# Patient Record
Sex: Female | Born: 1955 | Race: White | Hispanic: No | Marital: Married | Smoking: Never smoker
Health system: Southern US, Community
[De-identification: ages and names within clinical notes are randomized; demographics above are authoritative.]

## PROBLEM LIST (undated history)

## (undated) DIAGNOSIS — N183 Chronic kidney disease, stage 3 (moderate): Secondary | ICD-10-CM

## (undated) DIAGNOSIS — M549 Dorsalgia, unspecified: Secondary | ICD-10-CM

## (undated) DIAGNOSIS — K759 Inflammatory liver disease, unspecified: Secondary | ICD-10-CM

## (undated) DIAGNOSIS — D649 Anemia, unspecified: Secondary | ICD-10-CM

## (undated) DIAGNOSIS — R600 Localized edema: Secondary | ICD-10-CM

## (undated) DIAGNOSIS — G8929 Other chronic pain: Secondary | ICD-10-CM

## (undated) DIAGNOSIS — N289 Disorder of kidney and ureter, unspecified: Secondary | ICD-10-CM

## (undated) DIAGNOSIS — D631 Anemia in chronic kidney disease: Secondary | ICD-10-CM

## (undated) DIAGNOSIS — R161 Splenomegaly, not elsewhere classified: Secondary | ICD-10-CM

## (undated) DIAGNOSIS — K7460 Unspecified cirrhosis of liver: Secondary | ICD-10-CM

## (undated) DIAGNOSIS — K5090 Crohn's disease, unspecified, without complications: Secondary | ICD-10-CM

## (undated) DIAGNOSIS — I10 Essential (primary) hypertension: Secondary | ICD-10-CM

## (undated) DIAGNOSIS — K529 Noninfective gastroenteritis and colitis, unspecified: Secondary | ICD-10-CM

## (undated) DIAGNOSIS — R768 Other specified abnormal immunological findings in serum: Secondary | ICD-10-CM

## (undated) DIAGNOSIS — D696 Thrombocytopenia, unspecified: Secondary | ICD-10-CM

## (undated) HISTORY — DX: Essential (primary) hypertension: I10

## (undated) HISTORY — DX: Anemia in chronic kidney disease: D63.1

## (undated) HISTORY — PX: ABSCESS DRAINAGE: SHX1119

## (undated) HISTORY — DX: Chronic kidney disease, stage 3 (moderate): N18.3

## (undated) HISTORY — DX: Crohn's disease, unspecified, without complications: K50.90

## (undated) HISTORY — DX: Localized edema: R60.0

## (undated) HISTORY — PX: OTHER SURGICAL HISTORY: SHX169

## (undated) HISTORY — PX: CHOLECYSTECTOMY: SHX55

## (undated) HISTORY — PX: COLON SURGERY: SHX602

## (undated) HISTORY — PX: ABDOMINAL SURGERY: SHX537

## (undated) HISTORY — DX: Other specified abnormal immunological findings in serum: R76.8

## (undated) HISTORY — PX: ILEOSTOMY: SHX1783

## (undated) HISTORY — DX: Noninfective gastroenteritis and colitis, unspecified: K52.9

## (undated) HISTORY — DX: Anemia, unspecified: D64.9

---

## 2010-08-19 ENCOUNTER — Encounter (INDEPENDENT_AMBULATORY_CARE_PROVIDER_SITE_OTHER): Payer: Self-pay

## 2010-08-19 DIAGNOSIS — I10 Essential (primary) hypertension: Secondary | ICD-10-CM | POA: Insufficient documentation

## 2010-08-19 DIAGNOSIS — K5090 Crohn's disease, unspecified, without complications: Secondary | ICD-10-CM | POA: Insufficient documentation

## 2010-09-02 ENCOUNTER — Encounter (INDEPENDENT_AMBULATORY_CARE_PROVIDER_SITE_OTHER): Payer: Self-pay | Admitting: *Deleted

## 2010-09-02 ENCOUNTER — Telehealth (INDEPENDENT_AMBULATORY_CARE_PROVIDER_SITE_OTHER): Payer: Self-pay | Admitting: *Deleted

## 2010-09-02 ENCOUNTER — Encounter (INDEPENDENT_AMBULATORY_CARE_PROVIDER_SITE_OTHER): Payer: Self-pay | Admitting: Internal Medicine

## 2010-09-02 ENCOUNTER — Ambulatory Visit (INDEPENDENT_AMBULATORY_CARE_PROVIDER_SITE_OTHER): Payer: Medicare Other | Admitting: Internal Medicine

## 2010-09-02 VITALS — BP 100/64 | HR 72 | Temp 98.6°F | Ht 65.5 in | Wt 248.9 lb

## 2010-09-02 DIAGNOSIS — D649 Anemia, unspecified: Secondary | ICD-10-CM

## 2010-09-02 DIAGNOSIS — D62 Acute posthemorrhagic anemia: Secondary | ICD-10-CM | POA: Insufficient documentation

## 2010-09-02 DIAGNOSIS — K5090 Crohn's disease, unspecified, without complications: Secondary | ICD-10-CM

## 2010-09-02 MED ORDER — OMEPRAZOLE 20 MG PO CPDR: 20.0000 mg | DELAYED_RELEASE_CAPSULE | Freq: Two times a day (BID) | ORAL | Status: DC

## 2010-09-02 NOTE — Progress Notes (Signed)
Subjective:     Patient ID: Kathryn [name], female   DOB: March 08, 1955, 55 y.o.   MRN: [id number]  HPI Referral from Dr. Olena Leatherwood requesting capsule study.  Hx of Crohn's. Bleeding from stoma from the ileostomy site. Diagnosed with Crohn's 1985.   She has been having intestinal bleeding in her ileostomy bag which has now resolved. She says there were black clots in her ileostomy bag while in the hospital..  The last time she saw blood was the July 11th.  She c/o pain under rt posterior ribs and radiates around.  She c/o nausea.  She is having foamy green stools. She says her symptoms are similar when she had her gallbladder.  Admitted to [facility] last month for a Crohn's excerebration.  She received 2 units of PRBCs while in the hospital.  Sunday she was having rt posterior rib pain .  Sometimes her nose burns.  When she lies down at night she c/o  voiding frequently during the night. She does have occasionally acid reflux. No burning in her esophagus. No hematemesis.   She also had epigastric pain last month.  Most of her pain is in her back. She had been taking Meloxicam 3 times a week before her admission for pain in her hands..  Stools are yellow and foamy now.  She has a hx of a duodenal ulcer age 85 or 58.   Her last colon surgery was in 2004.   07/31/2010 Glucose 156, BUN 12, Creatinine 0.93, Calcium 8.6, NA 130, K 3.5, Chloride 96,  Hand H 11.8 and 35.6, MCV 94.0, Platelets 133, 6/22 H and H 8.8 and 25.9    Review of Systems see hpi  Family HX: She is married. She does not drink, smoke or do drugs. She has one child in good health.     Objective:   Physical ExamAlert and oriented. Skin warm and dry. Oral mucosa is moist. Natural teeth in good condition. Sclera anicteric, conjunctivae is pink. Thyroid not enlarged. No cervical lymphadenopathy. Lungs clear. Heart regular rate and rhythm.  Abdomen is soft. Bowel sounds are positive.  Multiple scars to abdomen.  Ileostomy bag in place with light  yellow/brown liquid stool.  No hepatomegaly. No abdominal masses felt. No tenderness.  No edema to lower extremities. Patient is alert and oriented.      Assessment:   Anemia,  PUD needs to be ruled out given hx of taking Meloxicam.  She also has a past hx of a duodenal     Plan:   EGD with Dr. [name],.The risks and benefits such as perforation, bleeding, and infection were reviewed with the patient and is agreeable.

## 2010-09-02 NOTE — Telephone Encounter (Signed)
EGD sch'd 10/05/10 @ 7:30 (6:30), pt aware, instr given to patient

## 2010-09-03 ENCOUNTER — Telehealth (INDEPENDENT_AMBULATORY_CARE_PROVIDER_SITE_OTHER): Payer: Self-pay | Admitting: Internal Medicine

## 2010-09-03 LAB — CBC
HCT: 34.9 % — ABNORMAL LOW (ref 36.0–46.0)
Hemoglobin: 11.5 g/dL — ABNORMAL LOW (ref 12.0–15.0)
MCH: 30.9 pg (ref 26.0–34.0)
MCHC: 33 g/dL (ref 30.0–36.0)
MCV: 93.8 fL (ref 78.0–100.0)
RDW: 14.9 % (ref 11.5–15.5)

## 2010-09-06 NOTE — Telephone Encounter (Signed)
Results given to patient

## 2010-09-07 ENCOUNTER — Telehealth (INDEPENDENT_AMBULATORY_CARE_PROVIDER_SITE_OTHER): Payer: Self-pay | Admitting: Internal Medicine

## 2010-09-07 NOTE — Telephone Encounter (Signed)
none

## 2010-10-04 MED ORDER — SODIUM CHLORIDE 0.45 % IV SOLN
Freq: Once | INTRAVENOUS | Status: AC
  Administered 2010-10-05: 07:00:00 via INTRAVENOUS

## 2010-10-05 ENCOUNTER — Other Ambulatory Visit (INDEPENDENT_AMBULATORY_CARE_PROVIDER_SITE_OTHER): Payer: Self-pay | Admitting: Internal Medicine

## 2010-10-05 ENCOUNTER — Encounter (HOSPITAL_COMMUNITY): Payer: Self-pay

## 2010-10-05 ENCOUNTER — Ambulatory Visit (HOSPITAL_COMMUNITY)
Admission: RE | Admit: 2010-10-05 | Discharge: 2010-10-05 | Disposition: A | Discharge status: Home / Self Care | Payer: Medicare Other | Source: Ambulatory Visit | Attending: Internal Medicine | Admitting: Internal Medicine

## 2010-10-05 ENCOUNTER — Encounter (HOSPITAL_COMMUNITY)
Admission: RE | Disposition: A | Discharge status: Home / Self Care | Payer: Self-pay | Source: Ambulatory Visit | Attending: Internal Medicine

## 2010-10-05 DIAGNOSIS — Z8639 Personal history of other endocrine, nutritional and metabolic disease: Secondary | ICD-10-CM

## 2010-10-05 DIAGNOSIS — R1011 Right upper quadrant pain: Secondary | ICD-10-CM | POA: Insufficient documentation

## 2010-10-05 DIAGNOSIS — Z862 Personal history of diseases of the blood and blood-forming organs and certain disorders involving the immune mechanism: Secondary | ICD-10-CM

## 2010-10-05 DIAGNOSIS — I10 Essential (primary) hypertension: Secondary | ICD-10-CM | POA: Insufficient documentation

## 2010-10-05 DIAGNOSIS — K2940 Chronic atrophic gastritis without bleeding: Secondary | ICD-10-CM | POA: Insufficient documentation

## 2010-10-05 DIAGNOSIS — K2960 Other gastritis without bleeding: Secondary | ICD-10-CM

## 2010-10-05 DIAGNOSIS — Z8719 Personal history of other diseases of the digestive system: Secondary | ICD-10-CM | POA: Insufficient documentation

## 2010-10-05 HISTORY — PX: ESOPHAGOGASTRODUODENOSCOPY: SHX5428

## 2010-10-05 SURGERY — EGD (ESOPHAGOGASTRODUODENOSCOPY)
Anesthesia: Moderate Sedation

## 2010-10-05 MED ORDER — MEPERIDINE HCL 25 MG/ML IJ SOLN
INTRAMUSCULAR | Status: DC | PRN
  Administered 2010-10-05 (×2): 25 mg via INTRAVENOUS

## 2010-10-05 MED ORDER — MEPERIDINE HCL 50 MG/ML IJ SOLN
INTRAMUSCULAR | Status: AC
  Filled 2010-10-05: qty 1

## 2010-10-05 MED ORDER — SUCRALFATE 1 G PO TABS: 1.0000 g | ORAL_TABLET | Freq: Four times a day (QID) | ORAL | Status: DC

## 2010-10-05 MED ORDER — MIDAZOLAM HCL 5 MG/5ML IJ SOLN
INTRAMUSCULAR | Status: AC
  Filled 2010-10-05: qty 10

## 2010-10-05 MED ORDER — BUTAMBEN-TETRACAINE-BENZOCAINE 2-2-14 % EX AERO
INHALATION_SPRAY | CUTANEOUS | Status: DC | PRN
  Administered 2010-10-05: 2 via TOPICAL

## 2010-10-05 MED ORDER — MIDAZOLAM HCL 5 MG/5ML IJ SOLN
INTRAMUSCULAR | Status: DC | PRN
  Administered 2010-10-05 (×5): 2 mg via INTRAVENOUS

## 2010-10-05 NOTE — H&P (Signed)
Kathryn [name] is an 55 y.o. female.   Chief Complaint: History of melena and anemia. Patient is here for EGD. HPI: Patient is 55 year old Caucasian female with history of Crohn's disease with ileostomy who's been experiencing intermittent melena for the last few months. First episode occurred last year and she has had these 2 episodes this year. She was admitted to [facility] in [place] and required 2 units of PRBCs. When she was seen in the office  4 weeks ago her hemoglobin was normal. She has been using meloxicam sporadically. She complains of intermittent pain in the right upper quadrant pain that she used to have prior to cholecystectomy. She also has a.chronic pain in left lower quadrant site where she had colostomy. Her heartburn is well controlled with a PPI. Although she has not a good appetite but she has not lost any weight. She still hasn rectal stump. She has pink rectal discharge worksheet every day and at times it is grossly bloody but small in volume. She has history of colonic Crohn's disease which was diagnosed in 27. Few years later she had come colostomy for the refractory anorectal disease.  8 years ago she had subtotal colectomy with ileostomy.  Past Medical History  Diagnosis Date  . Hypertension   . Enteritis presumed infectious   . Crohn's   . Crohn's   . Anemia     Past Surgical History  Procedure Date  . Colon surgery     MULTIPLE SURGERIES FOR CROHNS  . Ileostomy     multiple abdominal surgeries for Crohn's by Dr. Gabriel Cirri  . Cholecystectomy   . Abscess drainage     abdominal    Family History  Problem Relation Age of Onset  . Cancer Mother   . Stroke Father    Social History:  reports that she has never smoked. She has never used smokeless tobacco. She reports that she does not drink alcohol or use illicit drugs.  Allergies:  Allergies  Allergen Reactions  . Penicillins     Medications Prior to Admission  Medication Dose Route Frequency Provider Last Rate  Last Dose  . 0.45 % sodium chloride infusion   Intravenous Once [name], MD 20 mL/hr at 10/05/10 0715    . meperidine (DEMEROL) 50 MG/ML injection           . midazolam (VERSED) 5 MG/5ML injection            Medications Prior to Admission  Medication Sig Dispense Refill  . metroNIDAZOLE (FLAGYL) 500 MG tablet Take 500 mg by mouth every 8 (eight) hours.          No results found for this or any previous visit (from the past 48 hour(s)). No results found.  Review of Systems  Constitutional: Negative for fever and weight loss.  Gastrointestinal: Positive for blood in stool (daily rectal discharge which is blood tinged.) and melena (intermittent melena; none in 8 weeks.). Negative for nausea, vomiting, diarrhea and constipation. Heartburn: well controlled with prilosec. Abdominal pain: intermittent RUQ pain and chronic LLQ pain.    Blood pressure 136/84, pulse 87, temperature 98.4 F (36.9 C), temperature source Oral, resp. rate 18, height 5\' 5"  (1.651 m), weight 240 lb (108.863 kg), SpO2 94.00%. Physical Exam  Constitutional: She appears well-developed and well-nourished.  HENT:  Mouth/Throat: Oropharynx is clear and moist.  Eyes: Conjunctivae are normal. No scleral icterus.  Neck: No thyromegaly present.  Cardiovascular: Normal rate, regular rhythm and normal heart sounds.   No  murmur heard. Respiratory: Breath sounds normal.  GI: Soft. She exhibits no mass. There is tenderness (mild tenderness at RUQ and LLQ;). There is no rebound and no guarding.  Musculoskeletal: She exhibits no edema.  Lymphadenopathy:    She has no cervical adenopathy.  Neurological: She is alert.  Skin: Skin is warm and dry.     Assessment/Plan History of melena and anemia. Recurrent right upper quadrant abdominal pain. Diagnostic esophagogastroduodenoscopy.  Aairah Negrette U 10/05/2010, 7:45 AM

## 2010-10-05 NOTE — Op Note (Signed)
EGD PROCEDURE REPORT  PATIENT:  Kathryn [name]  MR#:  [id number] Birthdate:  1955-04-24, 55 y.o., female Endoscopist:  Dr. [name], MD Referred By:  Dr. Toma Deiters, MD Procedure Date: 10/05/2010  Procedure:   EGD  Indications:  History of melena and anemia; she also has a intermittent right upper quadrant abdominal pain and history of Crohn's disease.            Informed Consent: Procedure and risks were reviewed with the patient and informed consent was obtained. Medications:  Demerol 50  mg IV Versed 10  mg IV Cetacaine spray topically for oropharyngeal anesthesia  Description of procedure:  The endoscope was introduced through the mouth and advanced to the second portion of the duodenum without difficulty or limitations. The mucosal surfaces were surveyed very carefully during advancement of the scope and upon withdrawal.  Findings:  Esophagus:  Normal mucosa throughout. GEJ:  40 cm  Stomach:  Multiple erosions and gastric antrum somewhat 3-4 mm and some the background of edema and erythema. Pyloric channel was patent. Normal angularis fundus and cardia Duodenum:  Normal bulbar and post bulbar mucosa.  Therapeutic/Diagnostic Maneuvers Performed:  Biopsy taken from 4 of these gastric erosions.  Complications:  None  Impression: Multiple gastric antral erosions. Biopsy taken for histology. Differential diagnosis includes H. pylori gastritis NSAID gastropathy vs gastric Crohn's disease.  Recommendations:  No NSAIDs. Continue omeprazole at 20 mg daily 30 minutes before breakfast by mouth. Carafate 1 g by mouth a.c. and each bedtime. Physician will contact you with results of biopsy and further recommendations.  REHMAN,NAJEEB U  10/05/2010  8:15 AM  CC: Dr. Toma Deiters, MD

## 2010-10-05 NOTE — Progress Notes (Signed)
G junction at 40

## 2010-10-08 ENCOUNTER — Encounter (HOSPITAL_COMMUNITY): Payer: Self-pay | Admitting: Internal Medicine

## 2010-10-22 ENCOUNTER — Other Ambulatory Visit (INDEPENDENT_AMBULATORY_CARE_PROVIDER_SITE_OTHER): Payer: Self-pay | Admitting: Internal Medicine

## 2010-10-22 DIAGNOSIS — K6289 Other specified diseases of anus and rectum: Secondary | ICD-10-CM

## 2010-10-22 MED ORDER — MESALAMINE 1000 MG RE SUPP: 1000.0000 mg | Freq: Every day | RECTAL | Status: DC

## 2010-10-29 ENCOUNTER — Encounter (INDEPENDENT_AMBULATORY_CARE_PROVIDER_SITE_OTHER): Payer: Self-pay | Admitting: *Deleted

## 2012-11-14 ENCOUNTER — Other Ambulatory Visit (INDEPENDENT_AMBULATORY_CARE_PROVIDER_SITE_OTHER): Payer: Self-pay | Admitting: Internal Medicine

## 2012-11-14 ENCOUNTER — Ambulatory Visit (INDEPENDENT_AMBULATORY_CARE_PROVIDER_SITE_OTHER): Payer: Medicare Other | Admitting: Internal Medicine

## 2012-11-14 ENCOUNTER — Encounter (INDEPENDENT_AMBULATORY_CARE_PROVIDER_SITE_OTHER): Payer: Self-pay | Admitting: Internal Medicine

## 2012-11-14 VITALS — BP 138/86 | HR 80 | Temp 98.6°F | Ht 65.5 in | Wt 237.3 lb

## 2012-11-14 DIAGNOSIS — K7460 Unspecified cirrhosis of liver: Secondary | ICD-10-CM

## 2012-11-14 NOTE — Patient Instructions (Signed)
CBC, CMET, SMA, ANA, ferritin, ceruplasmin, sed rate, Hep B and C.  OV in 2 months.

## 2012-11-14 NOTE — Progress Notes (Signed)
Subjective:     Patient ID: Kathryn [name], female   DOB: Apr 27, 1955, 57 y.o.   MRN: [id number]  HPI Referred to our office by Dr. Olena Leatherwood for new diagnosis of cirrhosis. Rcent CT with and w/o CM abdomen/pelvis with CM revealed Hepatic nodularity compatible with cirrhosis and associated splenomegaly.  She underwent CT scan for a UTI.  No family hx of cirrhosis.  No hx of jaundice. Appetite comes and goes. She has lost 20 pounds in the past year unintentional. She has some fleeting rt upper back pain.  Today she feels bloated. She does not drink etoh. She has not drank in over 64yrs. She has one Tattoo on her back. (22 yrs ago at home) Not professional done.  She empties her ileostomy bag about 5-6 times a day and twice during the night.  No bright red bleeding.  Hx of Crohn's.  She has an subtotal colectomy  ileostomy in 2004. Diagnosed with Crohn's in 1985. Presently not taking any medication for her Crohn's. She was last seen in 2012. She was admitted to [facility] in June with acute kidney from apparently the lisinopril. She also was dehydrated. The Lisinopril was d/ced. I will get those records.  09/19/2010 EGD: Impression:  Multiple gastric antral erosions. Biopsy taken for histology.  Differential diagnosis includes H. pylori gastritis NSAID gastropathy vs gastric Crohn's disease.  Recommendations:   Review of Systems seehpi Current Outpatient Prescriptions  Medication Sig Dispense Refill  . ALPRAZolam (XANAX) 1 MG tablet Take 1 mg by mouth at bedtime as needed for sleep.      Marland Kitchen gabapentin (NEURONTIN) 100 MG capsule Take 100 mg by mouth 3 (three) times daily.      . mesalamine (CANASA) 1000 MG suppository Place 1 suppository (1,000 mg total) rectally at bedtime.  30 suppository  2  . omeprazole (PRILOSEC) 20 MG capsule Take 1 capsule (20 mg total) by mouth 2 (two) times daily before a meal.  60 capsule  2  . sucralfate (CARAFATE) 1 G tablet Take 1 tablet (1 g total) by mouth 4 (four)  times daily.  120 tablet  2   No current facility-administered medications for this visit.   Past Medical History  Diagnosis Date  . Hypertension   . Enteritis presumed infectious   . Crohn's   . Crohn's   . Anemia    Past Surgical History  Procedure Laterality Date  . Colon surgery      MULTIPLE SURGERIES FOR CROHNS  . Ileostomy      multiple abdominal surgeries for Crohn's by Dr. Gabriel Cirri  . Cholecystectomy    . Abscess drainage      abdominal  . Esophagogastroduodenoscopy  10/05/2010    Procedure: ESOPHAGOGASTRODUODENOSCOPY (EGD);  Surgeon: [name], MD;  Location: [facility];  Service: Endoscopy;  Laterality: N/A;  7:30 am   Allergies  Allergen Reactions  . Penicillins         Objective:   Physical Exam  Filed Vitals:   11/14/12 1434  BP: 138/86  Pulse: 80  Temp: 98.6 F (37 C)  Height: 5' 5.5" (1.664 m)  Weight: 237 lb 4.8 oz (107.639 kg)    Alert and oriented. Skin warm and dry. Oral mucosa is moist.   . Sclera anicteric, conjunctivae is pink. Thyroid not enlarged. No cervical lymphadenopathy. Lungs clear. Heart regular rate and rhythm.  Abdomen is soft. Bowel sounds are positive. No hepatomegaly. No abdominal masses felt. No tenderness.  No edema to lower extremities.  Ileostomy bag in place with light brown stool.  Multiple surgical scar to her abdomen.      Assessment:    Cirrhosis new diagnosis.NAFLD presumed. However I am going to rule out other possible differentials. Hx of tattoo 20 yrs ago.  Crohn's disease which she appears to be in remission.    GERD: She has been off the Prilosec for 3-4 months.  Plan:    CBC. CMET, ceruplasmin, ANA, SMA, ferritin, PT/INR, Hepatitis A and B,sed rate, pt/inr, Take Prilosec 20mg  daily.  OV 2 months.  Will get records from June from [facility] called)

## 2012-11-15 ENCOUNTER — Telehealth (INDEPENDENT_AMBULATORY_CARE_PROVIDER_SITE_OTHER): Payer: Self-pay | Admitting: Internal Medicine

## 2012-11-15 ENCOUNTER — Telehealth (INDEPENDENT_AMBULATORY_CARE_PROVIDER_SITE_OTHER): Payer: Self-pay | Admitting: *Deleted

## 2012-11-15 LAB — COMPREHENSIVE METABOLIC PANEL
ALT: 20 U/L (ref 0–35)
AST: 32 U/L (ref 0–37)
Albumin: 3.6 g/dL (ref 3.5–5.2)
Alkaline Phosphatase: 116 U/L (ref 39–117)
BUN: 17 mg/dL (ref 6–23)
Calcium: 9.4 mg/dL (ref 8.4–10.5)
Creat: 1 mg/dL (ref 0.50–1.10)
Glucose, Bld: 122 mg/dL — ABNORMAL HIGH (ref 70–99)
Total Bilirubin: 0.6 mg/dL (ref 0.3–1.2)
Total Protein: 6.9 g/dL (ref 6.0–8.3)

## 2012-11-15 LAB — CBC WITH DIFFERENTIAL/PLATELET
Basophils Relative: 1 % (ref 0–1)
Eosinophils Absolute: 0.1 10*3/uL (ref 0.0–0.7)
HCT: 32.9 % — ABNORMAL LOW (ref 36.0–46.0)
Hemoglobin: 10.8 g/dL — ABNORMAL LOW (ref 12.0–15.0)
MCH: 28.2 pg (ref 26.0–34.0)
MCHC: 32.8 g/dL (ref 30.0–36.0)
Monocytes Absolute: 0.4 10*3/uL (ref 0.1–1.0)
Monocytes Relative: 13 % — ABNORMAL HIGH (ref 3–12)

## 2012-11-15 LAB — PROTIME-INR: Prothrombin Time: 15 seconds (ref 11.6–15.2)

## 2012-11-15 LAB — SEDIMENTATION RATE: Sed Rate: 23 mm/hr — ABNORMAL HIGH (ref 0–22)

## 2012-11-15 LAB — ANTI-SMOOTH MUSCLE ANTIBODY, IGG: Smooth Muscle Ab: 15 U (ref <20)

## 2012-11-15 LAB — FERRITIN: Ferritin: 83 ng/mL (ref 10–291)

## 2012-11-15 LAB — ANTI-NUCLEAR AB-TITER (ANA TITER)

## 2012-11-15 NOTE — Telephone Encounter (Signed)
Kathryn [name]'s Blood Pressure medicine is Diltiazem 120 mg and she take 1 daily.

## 2012-11-15 NOTE — Telephone Encounter (Signed)
Possible liver possible once labs are in given new diagnosis of cirrhosis.

## 2012-11-15 NOTE — Telephone Encounter (Signed)
addressed

## 2012-11-15 NOTE — Telephone Encounter (Signed)
I talked with [facility] and they are going to add an Hep C Quaint by PCR before I call her.

## 2012-11-16 LAB — CERULOPLASMIN: Ceruloplasmin: 32 mg/dL (ref 20–60)

## 2012-11-16 LAB — HEPATITIS C RNA QUANTITATIVE: HCV Quantitative: 682566 IU/mL — ABNORMAL HIGH (ref <15)

## 2012-11-19 ENCOUNTER — Telehealth (INDEPENDENT_AMBULATORY_CARE_PROVIDER_SITE_OTHER): Payer: Self-pay | Admitting: Internal Medicine

## 2012-11-19 NOTE — Telephone Encounter (Signed)
Opened in error

## 2012-11-19 NOTE — Telephone Encounter (Signed)
No answer at home #

## 2012-11-20 ENCOUNTER — Telehealth (INDEPENDENT_AMBULATORY_CARE_PROVIDER_SITE_OTHER): Payer: Self-pay | Admitting: Internal Medicine

## 2012-11-20 DIAGNOSIS — B1920 Unspecified viral hepatitis C without hepatic coma: Secondary | ICD-10-CM

## 2012-11-20 NOTE — Telephone Encounter (Signed)
Will get a Hep C.genotype

## 2012-11-20 NOTE — Telephone Encounter (Signed)
Mesalamine 1gm PR nightly x 30 days. OV in 1 month. Hepatitis C genotype. OV in 1 month. She will need a liver biopsy. She says she is having bloody mucous from her rectum. She also tells me when she douches which is about once a year she will have water come from her rectum.

## 2012-11-22 ENCOUNTER — Telehealth (INDEPENDENT_AMBULATORY_CARE_PROVIDER_SITE_OTHER): Payer: Self-pay | Admitting: *Deleted

## 2012-11-22 DIAGNOSIS — K6289 Other specified diseases of anus and rectum: Secondary | ICD-10-CM

## 2012-11-22 DIAGNOSIS — K5090 Crohn's disease, unspecified, without complications: Secondary | ICD-10-CM

## 2012-11-22 MED ORDER — MESALAMINE 1000 MG RE SUPP: 1000.0000 mg | Freq: Every day | RECTAL | Status: DC

## 2012-11-22 NOTE — Telephone Encounter (Signed)
I have sent her a refill. Let her know

## 2012-11-22 NOTE — Telephone Encounter (Signed)
Her pharmacy no longer has the rx on file for Mesalamine suppository . Needs a new Rx called in. She was told by Terri to use these again. Her return phone [phone] or [phone].

## 2012-11-26 ENCOUNTER — Telehealth (INDEPENDENT_AMBULATORY_CARE_PROVIDER_SITE_OTHER): Payer: Self-pay | Admitting: Internal Medicine

## 2012-11-26 DIAGNOSIS — K7460 Unspecified cirrhosis of liver: Secondary | ICD-10-CM

## 2012-11-26 NOTE — Telephone Encounter (Signed)
Liver biopsy

## 2012-11-27 ENCOUNTER — Other Ambulatory Visit: Payer: Self-pay | Admitting: Radiology

## 2012-11-28 ENCOUNTER — Telehealth (INDEPENDENT_AMBULATORY_CARE_PROVIDER_SITE_OTHER): Payer: Self-pay | Admitting: *Deleted

## 2012-11-28 NOTE — Telephone Encounter (Signed)
The suppository medicine is going to cost her $800. She doesn't have medicine insurance nor the money for this. Would like to know if there is anything cheaper, a generic form that can be called in or samples she could ger? There return phone number is [phone] or [phone].

## 2012-11-29 NOTE — Telephone Encounter (Signed)
Samples of Canasa #9 given to patient.

## 2012-11-29 NOTE — Telephone Encounter (Signed)
Samples of Canasa left at front desk. Patient is aware

## 2012-11-30 ENCOUNTER — Encounter (HOSPITAL_COMMUNITY): Payer: Self-pay | Admitting: Pharmacy Technician

## 2012-11-30 ENCOUNTER — Encounter (HOSPITAL_COMMUNITY): Payer: Self-pay

## 2012-11-30 ENCOUNTER — Ambulatory Visit (HOSPITAL_COMMUNITY)
Admission: RE | Admit: 2012-11-30 | Discharge: 2012-11-30 | Disposition: A | Discharge status: Home / Self Care | Payer: Medicare Other | Source: Ambulatory Visit | Attending: Internal Medicine | Admitting: Internal Medicine

## 2012-11-30 DIAGNOSIS — B1920 Unspecified viral hepatitis C without hepatic coma: Secondary | ICD-10-CM | POA: Insufficient documentation

## 2012-11-30 DIAGNOSIS — I10 Essential (primary) hypertension: Secondary | ICD-10-CM | POA: Insufficient documentation

## 2012-11-30 DIAGNOSIS — K7460 Unspecified cirrhosis of liver: Secondary | ICD-10-CM

## 2012-11-30 DIAGNOSIS — K5090 Crohn's disease, unspecified, without complications: Secondary | ICD-10-CM | POA: Insufficient documentation

## 2012-11-30 LAB — CBC
Hemoglobin: 10.2 g/dL — ABNORMAL LOW (ref 12.0–15.0)
MCH: 28.1 pg (ref 26.0–34.0)
MCHC: 32.4 g/dL (ref 30.0–36.0)
Platelets: 64 10*3/uL — ABNORMAL LOW (ref 150–400)
RBC: 3.63 MIL/uL — ABNORMAL LOW (ref 3.87–5.11)
RDW: 13.8 % (ref 11.5–15.5)

## 2012-11-30 LAB — APTT: aPTT: 29 seconds (ref 24–37)

## 2012-11-30 LAB — PROTIME-INR: Prothrombin Time: 15.4 seconds — ABNORMAL HIGH (ref 11.6–15.2)

## 2012-11-30 MED ORDER — SODIUM CHLORIDE 0.9 % IV SOLN
INTRAVENOUS | Status: DC
  Administered 2012-11-30: 09:00:00 via INTRAVENOUS

## 2012-11-30 MED ORDER — OXYCODONE HCL 5 MG PO TABS
5.0000 mg | ORAL_TABLET | ORAL | Status: DC | PRN
  Administered 2012-11-30: 5 mg via ORAL
  Filled 2012-11-30 (×2): qty 1

## 2012-11-30 MED ORDER — FENTANYL CITRATE 0.05 MG/ML IJ SOLN
INTRAMUSCULAR | Status: AC
  Filled 2012-11-30: qty 4

## 2012-11-30 MED ORDER — MIDAZOLAM HCL 2 MG/2ML IJ SOLN
INTRAMUSCULAR | Status: AC | PRN
  Administered 2012-11-30: 1 mg via INTRAVENOUS

## 2012-11-30 MED ORDER — MIDAZOLAM HCL 2 MG/2ML IJ SOLN
INTRAMUSCULAR | Status: AC
  Filled 2012-11-30: qty 4

## 2012-11-30 MED ORDER — FENTANYL CITRATE 0.05 MG/ML IJ SOLN
INTRAMUSCULAR | Status: AC | PRN
  Administered 2012-11-30: 50 ug via INTRAVENOUS

## 2012-11-30 NOTE — Procedures (Signed)
US guided liver biopsy.  2 cores obtained.  No immediate complication.   

## 2012-11-30 NOTE — H&P (Signed)
Chief Complaint: "I'm here for a liver biopsy" Referring Physician:Terri Setzer, NP HPI: Kathryn [name] is an 57 y.o. female with cirrhosis and hepatitis C. She is referred for random liver biopsy. PMHx and meds reviewed.  Past Medical History:  Past Medical History  Diagnosis Date  . Hypertension   . Enteritis presumed infectious   . Crohn's   . Crohn's   . Anemia     Past Surgical History:  Past Surgical History  Procedure Laterality Date  . Colon surgery      MULTIPLE SURGERIES FOR CROHNS  . Ileostomy      multiple abdominal surgeries for Crohn's by Dr. Gabriel Cirri  . Cholecystectomy    . Abscess drainage      abdominal  . Esophagogastroduodenoscopy  10/05/2010    Procedure: ESOPHAGOGASTRODUODENOSCOPY (EGD);  Surgeon: [name], MD;  Location: [facility];  Service: Endoscopy;  Laterality: N/A;  7:30 am    Family History:  Family History  Problem Relation Age of Onset  . Cancer Mother   . Stroke Father     Social History:  reports that she has never smoked. She has never used smokeless tobacco. She reports that she does not drink alcohol or use illicit drugs.  Allergies:  Allergies  Allergen Reactions  . Penicillins Rash    Medications:   Medication List    ASK your doctor about these medications       acetaminophen 500 MG tablet  Commonly known as:  TYLENOL  Take 1,000 mg by mouth once.     ALPRAZolam 1 MG tablet  Commonly known as:  XANAX  Take 1 mg by mouth at bedtime as needed for sleep.     diltiazem 120 MG tablet  Commonly known as:  CARDIZEM  Take 120 mg by mouth daily.     gabapentin 100 MG capsule  Commonly known as:  NEURONTIN  Take 100 mg by mouth 3 (three) times daily.     mesalamine 1000 MG suppository  Commonly known as:  CANASA  Place 1 suppository (1,000 mg total) rectally at bedtime.     mesalamine 1000 MG suppository  Commonly known as:  CANASA  Place 1 suppository (1,000 mg total) rectally at bedtime.     omeprazole 20  MG capsule  Commonly known as:  PRILOSEC  Take 1 capsule (20 mg total) by mouth 2 (two) times daily before a meal.     sucralfate 1 G tablet  Commonly known as:  CARAFATE  Take 1 tablet (1 g total) by mouth 4 (four) times daily.     traMADol 50 MG tablet  Commonly known as:  ULTRAM  Take 50 mg by mouth every 8 (eight) hours as needed for pain.        Please HPI for pertinent positives, otherwise complete 10 system ROS negative.  Physical Exam: BP 123/68  Pulse 68  Temp(Src) 98.2 F (36.8 C) (Oral)  Resp 20  Ht 5' 5.5" (1.664 m)  Wt 237 lb (107.502 kg)  BMI 38.82 kg/m2  SpO2 100% Body mass index is 38.82 kg/(m^2).   General Appearance:  Alert, cooperative, no distress, appears stated age  Head:  Normocephalic, without obvious abnormality, atraumatic  ENT: Unremarkable  Neck: Supple, symmetrical, trachea midline  Lungs:   Clear to auscultation bilaterally, no w/r/r, respirations unlabored without use of accessory muscles.  Chest Wall:  No tenderness or deformity  Heart:  Regular rate and rhythm, S1, S2 normal, no murmur, rub or gallop.  Abdomen:   Soft, non-tender, non distended. Multiple scars c/w surgical history  Neurologic: Normal affect, no gross deficits.   No results found for this or any previous visit (from the past 48 hour(s)). No results found.  Assessment/Plan Cirrhosis, hepatitis C Discussed US guided liver biopsy. Explained procedure, risks, complications, sue of sedation. Labs pending Consent signed in chart  Brayton El PA-C 11/30/2012, 9:22 AM

## 2012-12-06 ENCOUNTER — Telehealth (INDEPENDENT_AMBULATORY_CARE_PROVIDER_SITE_OTHER): Payer: Self-pay | Admitting: Internal Medicine

## 2012-12-06 NOTE — Telephone Encounter (Signed)
error 

## 2012-12-11 ENCOUNTER — Telehealth (INDEPENDENT_AMBULATORY_CARE_PROVIDER_SITE_OTHER): Payer: Self-pay | Admitting: Internal Medicine

## 2012-12-11 NOTE — Telephone Encounter (Signed)
Results have been given to patient 

## 2012-12-14 ENCOUNTER — Encounter (INDEPENDENT_AMBULATORY_CARE_PROVIDER_SITE_OTHER): Payer: Self-pay

## 2012-12-20 ENCOUNTER — Encounter (INDEPENDENT_AMBULATORY_CARE_PROVIDER_SITE_OTHER): Payer: Self-pay | Admitting: *Deleted

## 2012-12-20 ENCOUNTER — Telehealth (INDEPENDENT_AMBULATORY_CARE_PROVIDER_SITE_OTHER): Payer: Self-pay | Admitting: *Deleted

## 2012-12-20 ENCOUNTER — Encounter (INDEPENDENT_AMBULATORY_CARE_PROVIDER_SITE_OTHER): Payer: Self-pay | Admitting: Internal Medicine

## 2012-12-20 ENCOUNTER — Ambulatory Visit (INDEPENDENT_AMBULATORY_CARE_PROVIDER_SITE_OTHER): Payer: Medicare Other | Admitting: Internal Medicine

## 2012-12-20 VITALS — BP 144/80 | HR 88 | Temp 98.6°F | Ht 65.5 in | Wt 236.8 lb

## 2012-12-20 DIAGNOSIS — K7460 Unspecified cirrhosis of liver: Secondary | ICD-10-CM

## 2012-12-20 DIAGNOSIS — K5090 Crohn's disease, unspecified, without complications: Secondary | ICD-10-CM

## 2012-12-20 DIAGNOSIS — R768 Other specified abnormal immunological findings in serum: Secondary | ICD-10-CM

## 2012-12-20 DIAGNOSIS — B1920 Unspecified viral hepatitis C without hepatic coma: Secondary | ICD-10-CM

## 2012-12-20 DIAGNOSIS — R531 Weakness: Secondary | ICD-10-CM

## 2012-12-20 DIAGNOSIS — R894 Abnormal immunological findings in specimens from other organs, systems and tissues: Secondary | ICD-10-CM

## 2012-12-20 NOTE — Progress Notes (Signed)
Subjective:     Patient ID: Kathryn [name], female   DOB: Dec 11, 1955, 57 y.o.   MRN: [id number]  HPI Here today for f/u of her Hepatitis C. Genotype 1A. Recent diagnosis of cirrhosis.     HPI Referred to our office by Dr. Olena Leatherwood for new diagnosis of cirrhosis. Rcent CT with and w/o CM abdomen/pelvis with CM revealed Hepatic nodularity compatible with cirrhosis and associated splenomegaly.  She underwent CT scan for a UTI at [facility]. No family hx of cirrhosis.  No hx of jaundice.  No hx of ETOH abuse.  Hx of of one tattoo and received a blood transfusion in 1985.  Hx of Crohn's. She has an subtotal colectomy ileostomy in 2004. Diagnosed with Crohn's in 1985. Presently not taking any medication for her Crohn's.  She was last seen in 2012.  She was admitted to [facility] in June with acute kidney from apparently the lisinopril. She also was dehydrated. The Lisinopril was d/ced.  I 09/19/2010 EGD:  Impression:  Multiple gastric antral erosions. Biopsy taken for histology.  Differential diagnosis includes H. pylori gastritis NSAID gastropathy vs gastric Crohn's disease.     Liver biopsy 11/30/2012: Periportal or periseptal interface Hepatitis (piecemeal necrosis) Score 2 Modified Staging. Marked bridging with occasional nodules (incomplete cirrhosis) Stage 5 of 6. toal MHAl 5 of 18. Less than 5% hepatic steatosis present. Chronic hepatitis, Type C. Mildly active.   11/14/2012 PT/INR 15.4 and 1.25 Hep C Quaint PCR: [id number]                         5.83 Sed rate 23  CBC    Component Value Date/Time   WBC 2.6* 11/30/2012 0856   RBC 3.63* 11/30/2012 0856   HGB 10.2* 11/30/2012 0856   HCT 31.5* 11/30/2012 0856   PLT 64* 11/30/2012 0856   MCV 86.8 11/30/2012 0856   MCH 28.1 11/30/2012 0856   MCHC 32.4 11/30/2012 0856   RDW 13.8 11/30/2012 0856   LYMPHSABS 0.6* 11/14/2012 1434   MONOABS 0.4 11/14/2012 1434   EOSABS 0.1 11/14/2012 1434   BASOSABS 0.0 11/14/2012 1434   CMP     Component Value  Date/Time   NA 137 11/14/2012 1434   K 3.7 11/14/2012 1434   CL 102 11/14/2012 1434   CO2 27 11/14/2012 1434   GLUCOSE 122* 11/14/2012 1434   BUN 17 11/14/2012 1434   CREATININE 1.00 11/14/2012 1434   CALCIUM 9.4 11/14/2012 1434   PROT 6.9 11/14/2012 1434   ALBUMIN 3.6 11/14/2012 1434   AST 32 11/14/2012 1434   ALT 20 11/14/2012 1434   ALKPHOS 116 11/14/2012 1434   BILITOT 0.6 11/14/2012 1434   Review of Systems Current Outpatient Prescriptions  Medication Sig Dispense Refill  . acetaminophen (TYLENOL) 500 MG tablet Take 1,000 mg by mouth once.      . ALPRAZolam (XANAX) 1 MG tablet Take 1 mg by mouth at bedtime as needed for sleep.      Marland Kitchen diltiazem (CARDIZEM) 120 MG tablet Take 120 mg by mouth daily.       Marland Kitchen gabapentin (NEURONTIN) 100 MG capsule Take 100 mg by mouth 3 (three) times daily.      . mesalamine (CANASA) 1000 MG suppository Place 1 suppository (1,000 mg total) rectally at bedtime.  30 suppository  1  . traMADol (ULTRAM) 50 MG tablet Take 50 mg by mouth every 8 (eight) hours as needed for pain.      . mesalamine (CANASA)  1000 MG suppository Place 1 suppository (1,000 mg total) rectally at bedtime.  30 suppository  2  . omeprazole (PRILOSEC) 20 MG capsule Take 1 capsule (20 mg total) by mouth 2 (two) times daily before a meal.  60 capsule  2  . sucralfate (CARAFATE) 1 G tablet Take 1 tablet (1 g total) by mouth 4 (four) times daily.  120 tablet  2   No current facility-administered medications for this visit.   Past Medical History  Diagnosis Date  . Hypertension   . Enteritis presumed infectious   . Crohn's   . Crohn's   . Anemia   . Hepatitis C antibody test positive    Past Surgical History  Procedure Laterality Date  . Colon surgery      MULTIPLE SURGERIES FOR CROHNS  . Ileostomy      multiple abdominal surgeries for Crohn's by Dr. Gabriel Cirri  . Cholecystectomy    . Abscess drainage      abdominal  . Esophagogastroduodenoscopy  10/05/2010    Procedure:  ESOPHAGOGASTRODUODENOSCOPY (EGD);  Surgeon: [name], MD;  Location: [facility];  Service: Endoscopy;  Laterality: N/A;  7:30 am   Allergies  Allergen Reactions  . Penicillins Rash        Objective:   Physical Exam  Filed Vitals:   12/20/12 1552  BP: 144/80  Pulse: 88  Temp: 98.6 F (37 C)  Height: 5' 5.5" (1.664 m)  Weight: 236 lb 12.8 oz (107.412 kg)  Alert and oriented. Skin warm and dry. Oral mucosa is moist.   . Sclera anicteric, conjunctivae is pink. Thyroid not enlarged. No cervical lymphadenopathy. Lungs clear. Heart regular rate and rhythm.  Abdomen is soft. Bowel sounds are positive. No hepatomegaly. No abdominal masses felt. No tenderness.  No edema to lower extremities. Ileostomy bag in place.       Assessment:    Hepatitis C with a viral load.     Plan:    Will get medications approved and then bring in for Hep C teaching. TSH today.  Will be treated with Harvoni once a day x 12 weeks. (Sofosbuvir 400mg  and Ledipasvir 90mg ).

## 2012-12-20 NOTE — Telephone Encounter (Signed)
.  Per Delrae Rend, the patient needs to have a TSH drawn prior to Hep C. Treatment.

## 2012-12-20 NOTE — Patient Instructions (Signed)
Will notifiy patient when we hear from insurance company,. We are anticipating treating.-+

## 2013-01-14 ENCOUNTER — Ambulatory Visit (INDEPENDENT_AMBULATORY_CARE_PROVIDER_SITE_OTHER): Payer: Medicare Other | Admitting: Internal Medicine

## 2013-01-14 ENCOUNTER — Encounter (INDEPENDENT_AMBULATORY_CARE_PROVIDER_SITE_OTHER): Payer: Self-pay | Admitting: Internal Medicine

## 2013-01-14 VITALS — BP 142/60 | HR 76 | Temp 97.5°F | Ht 65.5 in | Wt 239.3 lb

## 2013-01-14 DIAGNOSIS — R894 Abnormal immunological findings in specimens from other organs, systems and tissues: Secondary | ICD-10-CM

## 2013-01-14 DIAGNOSIS — D696 Thrombocytopenia, unspecified: Secondary | ICD-10-CM

## 2013-01-14 DIAGNOSIS — R768 Other specified abnormal immunological findings in serum: Secondary | ICD-10-CM

## 2013-01-14 NOTE — Patient Instructions (Signed)
CBC today. Will have patient teaching once we hear from her insurance company

## 2013-01-14 NOTE — Progress Notes (Signed)
Subjective:     Patient ID: Kathryn [name], female   DOB: 10-Jan-1956, 57 y.o.   MRN: [id number]  HPI Here today for f/u of her Hepatitis C. Referred to our office by Dr. Olena Leatherwood for a new diagnosis of cirrhosis. Recent CT with and W/o revealed Hepatic nodularity compatible with cirrhosis and associated spelnomegaly. No family hx of cirrhosis. No hx of jaundice. She does not drink etoh. She has one tattoo on her back. (22 YRS AGO AT HOME). Not professional done.  She was positive for Hep C with a viral load. She is genotype 1A. Noted on last lab draw her platelet were 64. Will treat with Harvoni Biopsy:Chronic Hepatitis, Viral Type C, Mildly active. Less than 5% hepatic steatosis present.  Hx of Crohn's. She has an subtotal colectomy ileostomy in 2004. Diagnosed with Crohn's in 1985. Presently not taking any medication for her Crohn's.  Appetite is good. No weight loss. No abdominal pain. Empties ileostomy about 3-5 times a day. No melena or bright red blood. Occasionally has mucous from her rectum. No bleeding. No pain.  CBC    Component Value Date/Time   WBC 2.6* 11/30/2012 0856   RBC 3.63* 11/30/2012 0856   HGB 10.2* 11/30/2012 0856   HCT 31.5* 11/30/2012 0856   PLT 64* 11/30/2012 0856   MCV 86.8 11/30/2012 0856   MCH 28.1 11/30/2012 0856   MCHC 32.4 11/30/2012 0856   RDW 13.8 11/30/2012 0856   LYMPHSABS 0.6* 11/14/2012 1434   MONOABS 0.4 11/14/2012 1434   EOSABS 0.1 11/14/2012 1434   BASOSABS 0.0 11/14/2012 1434   CMP     Component Value Date/Time   NA 137 11/14/2012 1434   K 3.7 11/14/2012 1434   CL 102 11/14/2012 1434   CO2 27 11/14/2012 1434   GLUCOSE 122* 11/14/2012 1434   BUN 17 11/14/2012 1434   CREATININE 1.00 11/14/2012 1434   CALCIUM 9.4 11/14/2012 1434   PROT 6.9 11/14/2012 1434   ALBUMIN 3.6 11/14/2012 1434   AST 32 11/14/2012 1434   ALT 20 11/14/2012 1434   ALKPHOS 116 11/14/2012 1434   BILITOT 0.6 11/14/2012 1434        Review of Systems     Objective:   Physical  Exam There were no vitals filed for this visit.  Filed Vitals:   01/14/13 1511  Height: 5' 5.5" (1.664 m)  Weight: 239 lb 4.8 oz (108.546 kg)   Alert and oriented. Skin warm and dry. Oral mucosa is moist.   . Sclera anicteric, conjunctivae is pink. Thyroid not enlarged. No cervical lymphadenopathy. Lungs clear. Heart regular rate and rhythm.  Abdomen is soft. Bowel sounds are positive. No hepatomegaly. No abdominal masses felt. No tenderness.  No edema to lower extremities.  Multiple scars to abdomen. Ileostomy bag in place with brown liquid stool.       Assessment:    Hep C active. Will treat with harvoni. Thrombocytopenia probably from her liver diseae.    Plan:     CBC today. She will have office visit for patient teaching when we hear from her insurance company.

## 2013-01-15 LAB — CBC WITH DIFFERENTIAL/PLATELET
Basophils Absolute: 0 10*3/uL (ref 0.0–0.1)
Basophils Relative: 0 % (ref 0–1)
Eosinophils Absolute: 0.2 10*3/uL (ref 0.0–0.7)
Eosinophils Relative: 5 % (ref 0–5)
HCT: 33.8 % — ABNORMAL LOW (ref 36.0–46.0)
MCH: 27.8 pg (ref 26.0–34.0)
MCHC: 33.4 g/dL (ref 30.0–36.0)
MCV: 83 fL (ref 78.0–100.0)
Monocytes Relative: 16 % — ABNORMAL HIGH (ref 3–12)
Platelets: 83 10*3/uL — ABNORMAL LOW (ref 150–400)
RDW: 15.7 % — ABNORMAL HIGH (ref 11.5–15.5)
WBC: 2.8 10*3/uL — ABNORMAL LOW (ref 4.0–10.5)

## 2013-04-24 ENCOUNTER — Encounter (INDEPENDENT_AMBULATORY_CARE_PROVIDER_SITE_OTHER): Payer: Self-pay | Admitting: Internal Medicine

## 2013-05-06 ENCOUNTER — Telehealth (INDEPENDENT_AMBULATORY_CARE_PROVIDER_SITE_OTHER): Payer: Self-pay | Admitting: *Deleted

## 2013-05-06 NOTE — Telephone Encounter (Addendum)
Arline [name] has received a call from her insurance company stating she has been approved for a medication. Would like to speak with the nurse about this is she could return her call at [phone] until 12:00 pm then [phone] after that.

## 2013-05-08 NOTE — Telephone Encounter (Signed)
Patient was called. She sates that with her approval, Harvoni will cost her $4000.00. I returned the call to [facility]. I was told that due to her insurance that she was not eligilbe for the discount card. They provided the following information: Patient [employer] [phone] , [phone] (fax) [facility] [phone].  was called and made aware , she will contact our office when she finds out something.

## 2013-05-14 ENCOUNTER — Ambulatory Visit (INDEPENDENT_AMBULATORY_CARE_PROVIDER_SITE_OTHER): Payer: Medicare HMO | Admitting: General Practice

## 2013-05-14 ENCOUNTER — Encounter: Payer: Self-pay | Admitting: General Practice

## 2013-05-14 VITALS — BP 144/82 | HR 81 | Temp 97.2°F | Ht 65.5 in | Wt 242.6 lb

## 2013-05-14 DIAGNOSIS — F411 Generalized anxiety disorder: Secondary | ICD-10-CM

## 2013-05-14 DIAGNOSIS — K219 Gastro-esophageal reflux disease without esophagitis: Secondary | ICD-10-CM

## 2013-05-14 DIAGNOSIS — Z7689 Persons encountering health services in other specified circumstances: Secondary | ICD-10-CM

## 2013-05-14 DIAGNOSIS — I10 Essential (primary) hypertension: Secondary | ICD-10-CM

## 2013-05-14 NOTE — Patient Instructions (Signed)

## 2013-05-14 NOTE — Progress Notes (Signed)
   Subjective:    Patient ID: Kathryn [name], female    DOB: [birth year], 58 y.o.   MRN: [id number]  HPI Patient presents today to establish care. History of htn, Hep C, peripheral neuropathy, insomnia, chronic pain (back and hands, arthritis), chron's disease. She has an ileostomy. Hep C is being managed by GI in [place]. Taking medications as prescribed. Patient was being seen by primary care provider in [place], but changed due to insurance change needed to seek another provider. Reports medications are effective in managing chronic health conditions.     Review of Systems  Constitutional: Negative for fever and chills.  Respiratory: Negative for chest tightness and shortness of breath.   Cardiovascular: Negative for chest pain and palpitations.  Gastrointestinal:       Ileostomy   Genitourinary: Negative for dysuria and difficulty urinating.  Neurological: Negative for dizziness, weakness and headaches.       Objective:   Physical Exam  Constitutional: She is oriented to person, place, and time. She appears well-developed and well-nourished.  HENT:  Head: Normocephalic and atraumatic.  Right Ear: External ear normal.  Left Ear: External ear normal.  Mouth/Throat: Oropharynx is clear and moist.  Neck: Normal range of motion. Neck supple. No thyromegaly present.  Abdominal: Soft. Bowel sounds are normal. She exhibits no distension. There is no tenderness.  ileostomy  Lymphadenopathy:    She has no cervical adenopathy.  Neurological: She is alert and oriented to person, place, and time.  Skin: Skin is warm and dry.  Psychiatric: She has a normal mood and affect.          Assessment & Plan:  1. Encounter to establish care   2. Hypertension   3. GERD (gastroesophageal reflux disease)   4. Generalized anxiety disorder -Patient to sign release of medical records forms -Patient verbalized she will be due for routine labs in 1 month -Schedule f/u appointment for 1  month -RTO prn -Patient verbalized understanding Coralie Keens, FNP-C

## 2013-05-23 ENCOUNTER — Other Ambulatory Visit: Payer: Self-pay | Admitting: General Practice

## 2013-05-23 ENCOUNTER — Telehealth: Payer: Self-pay | Admitting: General Practice

## 2013-05-23 NOTE — Telephone Encounter (Signed)
Please review

## 2013-05-28 ENCOUNTER — Encounter: Payer: Self-pay | Admitting: Family Medicine

## 2013-05-28 ENCOUNTER — Ambulatory Visit (INDEPENDENT_AMBULATORY_CARE_PROVIDER_SITE_OTHER): Payer: Medicare HMO | Admitting: Family Medicine

## 2013-05-28 VITALS — BP 138/80 | HR 73 | Temp 98.2°F | Ht 65.5 in | Wt 235.4 lb

## 2013-05-28 DIAGNOSIS — N390 Urinary tract infection, site not specified: Secondary | ICD-10-CM

## 2013-05-28 DIAGNOSIS — R309 Painful micturition, unspecified: Secondary | ICD-10-CM

## 2013-05-28 DIAGNOSIS — R109 Unspecified abdominal pain: Secondary | ICD-10-CM

## 2013-05-28 DIAGNOSIS — R300 Dysuria: Secondary | ICD-10-CM

## 2013-05-28 LAB — POCT URINALYSIS DIPSTICK
Bilirubin, UA: NEGATIVE
Blood, UA: NEGATIVE
Glucose, UA: NEGATIVE
Ketones, UA: NEGATIVE
Nitrite, UA: NEGATIVE
Protein, UA: NEGATIVE
Spec Grav, UA: <=1.005
Urobilinogen, UA: NEGATIVE
pH, UA: 6

## 2013-05-28 LAB — POCT UA - MICROSCOPIC ONLY
Bacteria, U Microscopic: NEGATIVE
Casts, Ur, LPF, POC: NEGATIVE
Mucus, UA: NEGATIVE
RBC, urine, microscopic: NEGATIVE
Yeast, UA: NEGATIVE

## 2013-05-28 MED ORDER — FLUCONAZOLE 150 MG PO TABS: 150.0000 mg | ORAL_TABLET | Freq: Once | ORAL | Status: DC

## 2013-05-28 MED ORDER — CIPROFLOXACIN HCL 500 MG PO TABS: 500.0000 mg | ORAL_TABLET | Freq: Two times a day (BID) | ORAL | Status: DC

## 2013-05-28 NOTE — Progress Notes (Signed)
   Subjective:    Patient ID: Kathryn [name], female    DOB: [birth year], 58 y.o.   MRN: [id number]  HPI  This 58 y.o. female presents for evaluation of urinary frequency and urinary sx's.  Review of Systems    No chest pain, SOB, HA, dizziness, vision change, N/V, diarrhea, constipation, dysuria, urinary urgency or frequency, myalgias, arthralgias or rash.  Objective:   Physical Exam  Vital signs noted  Well developed well nourished female.  HEENT - Head atraumatic Normocephalic                Eyes - PERRLA, Conjuctiva - clear Sclera- Clear EOMI                Ears - EAC's Wnl TM's Wnl Gross Hearing WNL Respiratory - Lungs CTA bilateral Cardiac - RRR S1 and S2 without murmur GI - Abdomen soft Nontender and bowel sounds active x 4  Results for orders placed in visit on 05/28/13  POCT URINALYSIS DIPSTICK      Result Value Ref Range   Color, UA straw     Clarity, UA cloudy     Glucose, UA neg     Bilirubin, UA neg     Ketones, UA neg     Spec Grav, UA <=1.005     Blood, UA neg     pH, UA 6.0     Protein, UA neg     Urobilinogen, UA negative     Nitrite, UA neg     Leukocytes, UA Trace    POCT UA - MICROSCOPIC ONLY      Result Value Ref Range   WBC, Ur, HPF, POC rare     RBC, urine, microscopic neg     Bacteria, U Microscopic neg     Mucus, UA neg     Epithelial cells, urine per micros rare     Crystals, Ur, HPF, POC mod     Casts, Ur, LPF, POC neg     Yeast, UA neg        Assessment & Plan:  Abdominal pain - Plan: POCT urinalysis dipstick, POCT UA - Microscopic Only, ciprofloxacin (CIPRO) 500 MG tablet, Urine culture, fluconazole (DIFLUCAN) 150 MG tablet  Painful urination - Plan: POCT urinalysis dipstick, POCT UA - Microscopic Only, ciprofloxacin (CIPRO) 500 MG tablet, Urine culture, fluconazole (DIFLUCAN) 150 MG tablet  UTI (lower urinary tract infection) - Plan: ciprofloxacin (CIPRO) 500 MG tablet, Urine culture, fluconazole (DIFLUCAN) 150 MG tablet  Deatra Canter FNP

## 2013-05-30 ENCOUNTER — Encounter: Payer: Self-pay | Admitting: Family Medicine

## 2013-05-30 LAB — URINE CULTURE

## 2013-06-04 ENCOUNTER — Other Ambulatory Visit: Payer: Self-pay | Admitting: General Practice

## 2013-06-04 ENCOUNTER — Telehealth: Payer: Self-pay | Admitting: General Practice

## 2013-06-04 NOTE — Telephone Encounter (Signed)
Patient is new to practice. She should schedule appointment.

## 2013-06-05 ENCOUNTER — Encounter: Payer: Self-pay | Admitting: Family Medicine

## 2013-06-05 DIAGNOSIS — R10816 Epigastric abdominal tenderness: Secondary | ICD-10-CM

## 2013-06-05 DIAGNOSIS — R748 Abnormal levels of other serum enzymes: Secondary | ICD-10-CM

## 2013-06-05 NOTE — Telephone Encounter (Signed)
PAtient has been seen twice once by you and the other time by bill

## 2013-06-06 ENCOUNTER — Other Ambulatory Visit: Payer: Self-pay | Admitting: General Practice

## 2013-06-06 ENCOUNTER — Telehealth: Payer: Self-pay | Admitting: General Practice

## 2013-06-06 NOTE — Telephone Encounter (Signed)
Patient wanted a appt with someone for her routine follow ups. Patient was seen 4/7 by Mae to establish care and she need a appt to follow up from that. appt given with christy hawls

## 2013-06-06 NOTE — Telephone Encounter (Signed)
Please have pharmacy fax med list

## 2013-06-07 ENCOUNTER — Other Ambulatory Visit: Payer: Self-pay | Admitting: General Practice

## 2013-06-07 DIAGNOSIS — G8929 Other chronic pain: Secondary | ICD-10-CM

## 2013-06-07 DIAGNOSIS — M79643 Pain in unspecified hand: Principal | ICD-10-CM

## 2013-06-07 MED ORDER — TRAMADOL HCL 50 MG PO TABS: 50.0000 mg | ORAL_TABLET | Freq: Two times a day (BID) | ORAL | Status: DC | PRN

## 2013-06-07 NOTE — Telephone Encounter (Signed)
[facility] is faxing over it was the one in [place]

## 2013-06-07 NOTE — Telephone Encounter (Signed)
taked to patient she is calling her other Dr. To see why they havent sent records will call pharmacy to fax over med list

## 2013-06-09 ENCOUNTER — Emergency Department (HOSPITAL_COMMUNITY)
Admission: EM | Admit: 2013-06-09 | Discharge: 2013-06-09 | Disposition: A | Discharge status: Home / Self Care | Payer: Medicare HMO | Attending: Emergency Medicine | Admitting: Emergency Medicine

## 2013-06-09 ENCOUNTER — Emergency Department (HOSPITAL_COMMUNITY): Payer: Medicare HMO

## 2013-06-09 ENCOUNTER — Encounter (HOSPITAL_COMMUNITY): Payer: Self-pay | Admitting: Emergency Medicine

## 2013-06-09 DIAGNOSIS — R531 Weakness: Secondary | ICD-10-CM

## 2013-06-09 DIAGNOSIS — R748 Abnormal levels of other serum enzymes: Secondary | ICD-10-CM | POA: Insufficient documentation

## 2013-06-09 DIAGNOSIS — I10 Essential (primary) hypertension: Secondary | ICD-10-CM | POA: Insufficient documentation

## 2013-06-09 DIAGNOSIS — R630 Anorexia: Secondary | ICD-10-CM | POA: Insufficient documentation

## 2013-06-09 DIAGNOSIS — Z792 Long term (current) use of antibiotics: Secondary | ICD-10-CM | POA: Insufficient documentation

## 2013-06-09 DIAGNOSIS — K5090 Crohn's disease, unspecified, without complications: Secondary | ICD-10-CM | POA: Insufficient documentation

## 2013-06-09 DIAGNOSIS — D61818 Other pancytopenia: Secondary | ICD-10-CM | POA: Insufficient documentation

## 2013-06-09 DIAGNOSIS — K7460 Unspecified cirrhosis of liver: Secondary | ICD-10-CM | POA: Insufficient documentation

## 2013-06-09 DIAGNOSIS — H538 Other visual disturbances: Secondary | ICD-10-CM | POA: Insufficient documentation

## 2013-06-09 DIAGNOSIS — Z8619 Personal history of other infectious and parasitic diseases: Secondary | ICD-10-CM | POA: Insufficient documentation

## 2013-06-09 DIAGNOSIS — R51 Headache: Secondary | ICD-10-CM | POA: Insufficient documentation

## 2013-06-09 DIAGNOSIS — Z880 Allergy status to penicillin: Secondary | ICD-10-CM | POA: Insufficient documentation

## 2013-06-09 DIAGNOSIS — Z79899 Other long term (current) drug therapy: Secondary | ICD-10-CM | POA: Insufficient documentation

## 2013-06-09 LAB — URINALYSIS, ROUTINE W REFLEX MICROSCOPIC
Bilirubin Urine: NEGATIVE
Glucose, UA: NEGATIVE mg/dL
Ketones, ur: NEGATIVE mg/dL
LEUKOCYTES UA: NEGATIVE
NITRITE: NEGATIVE
PROTEIN: NEGATIVE mg/dL
Specific Gravity, Urine: 1.01 (ref 1.005–1.030)
Urobilinogen, UA: 0.2 mg/dL (ref 0.0–1.0)
pH: 6 (ref 5.0–8.0)

## 2013-06-09 LAB — CBC WITH DIFFERENTIAL/PLATELET
BASOS ABS: 0 10*3/uL (ref 0.0–0.1)
BASOS PCT: 0 % (ref 0–1)
EOS ABS: 0.1 10*3/uL (ref 0.0–0.7)
Eosinophils Relative: 2 % (ref 0–5)
HCT: 31.4 % — ABNORMAL LOW (ref 36.0–46.0)
HEMOGLOBIN: 10.5 g/dL — AB (ref 12.0–15.0)
Lymphocytes Relative: 25 % (ref 12–46)
Lymphs Abs: 0.7 10*3/uL (ref 0.7–4.0)
MCH: 28.5 pg (ref 26.0–34.0)
MCHC: 33.4 g/dL (ref 30.0–36.0)
MCV: 85.1 fL (ref 78.0–100.0)
MONO ABS: 0.4 10*3/uL (ref 0.1–1.0)
Monocytes Relative: 15 % — ABNORMAL HIGH (ref 3–12)
NEUTROS ABS: 1.6 10*3/uL — AB (ref 1.7–7.7)
NEUTROS PCT: 58 % (ref 43–77)
Platelets: 62 10*3/uL — ABNORMAL LOW (ref 150–400)
RBC: 3.69 MIL/uL — ABNORMAL LOW (ref 3.87–5.11)
RDW: 15.2 % (ref 11.5–15.5)
Smear Review: DECREASED
WBC: 2.8 10*3/uL — ABNORMAL LOW (ref 4.0–10.5)

## 2013-06-09 LAB — HEPATIC FUNCTION PANEL
ALT: 22 U/L (ref 0–35)
AST: 38 U/L — ABNORMAL HIGH (ref 0–37)
Albumin: 3.1 g/dL — ABNORMAL LOW (ref 3.5–5.2)
Alkaline Phosphatase: 102 U/L (ref 39–117)
BILIRUBIN TOTAL: 0.7 mg/dL (ref 0.3–1.2)
Bilirubin, Direct: 0.2 mg/dL (ref 0.0–0.3)
Indirect Bilirubin: 0.5 mg/dL (ref 0.3–0.9)
Total Protein: 6.6 g/dL (ref 6.0–8.3)

## 2013-06-09 LAB — BASIC METABOLIC PANEL
BUN: 12 mg/dL (ref 6–23)
CALCIUM: 8.9 mg/dL (ref 8.4–10.5)
CO2: 25 mEq/L (ref 19–32)
Chloride: 102 mEq/L (ref 96–112)
Creatinine, Ser: 0.95 mg/dL (ref 0.50–1.10)
GFR, EST AFRICAN AMERICAN: 76 mL/min — AB (ref >90)
GFR, EST NON AFRICAN AMERICAN: 65 mL/min — AB (ref >90)
Glucose, Bld: 94 mg/dL (ref 70–99)
POTASSIUM: 3.7 meq/L (ref 3.7–5.3)
Sodium: 138 mEq/L (ref 137–147)

## 2013-06-09 LAB — URINE MICROSCOPIC-ADD ON

## 2013-06-09 LAB — LIPASE, BLOOD: Lipase: 96 U/L — ABNORMAL HIGH (ref 11–59)

## 2013-06-09 MED ORDER — SODIUM CHLORIDE 0.9 % IV BOLUS (SEPSIS)
1000.0000 mL | Freq: Once | INTRAVENOUS | Status: AC
  Administered 2013-06-09: 1000 mL via INTRAVENOUS

## 2013-06-09 MED ORDER — IOHEXOL 300 MG/ML  SOLN
50.0000 mL | Freq: Once | INTRAMUSCULAR | Status: AC | PRN
  Administered 2013-06-09: 50 mL via ORAL

## 2013-06-09 MED ORDER — IOHEXOL 300 MG/ML  SOLN
100.0000 mL | Freq: Once | INTRAMUSCULAR | Status: AC | PRN
  Administered 2013-06-09: 100 mL via INTRAVENOUS

## 2013-06-09 NOTE — Discharge Instructions (Signed)
Followup your gastroenterologist this week he

## 2013-06-09 NOTE — ED Notes (Signed)
Patient with no complaints at this time. Respirations even and unlabored. Skin warm/dry. Discharge instructions reviewed with patient at this time. Patient given opportunity to voice concerns/ask questions. IV removed per policy and band-aid applied to site. Patient discharged at this time and left Emergency Department with steady gait.  

## 2013-06-09 NOTE — ED Provider Notes (Signed)
CSN: [id number]     Arrival date & time 06/09/13  1302 History  This chart was scribed for Donnetta HutchingBrian Perina Salvaggio, MD by Quintella ReichertMatthew Underwood, ED scribe.  This patient was seen in room APA16A/APA16A and the patient's care was started at 2:04 PM.   Chief Complaint  Patient presents with  . Weakness  . Blurred Vision    The history is provided by the patient. No language interpreter was used.    HPI Comments: Kathryn [name] is a 58 y.o. female who presents to the Emergency Department complaining of 2 days of persistent, progressively-worsening generalized weakness with associated headache, nausea, intermittent muscle cramping, and mild blurred vision.  Pt states "I feel like I'm having either kidney failure or a low sodium episode."  She reports prior episodes of the same.  She also reports decreased appetite and has been eating very little.  Pt has h/o ileostomy secondary to Crohn's disease.  She also admits to h/o hepatitis C and cirrhosis.    PCP is Rudi HeapMOORE, DONALD, MD  GI is Dr. Karilyn Cotaehman   Past Medical History  Diagnosis Date  . Hypertension   . Enteritis presumed infectious   . Crohn's   . Crohn's   . Anemia   . Hepatitis C antibody test positive     Past Surgical History  Procedure Laterality Date  . Colon surgery      MULTIPLE SURGERIES FOR CROHNS  . Ileostomy      multiple abdominal surgeries for Crohn's by Dr. Gabriel CirrieMason  . Cholecystectomy    . Abscess drainage      abdominal  . Esophagogastroduodenoscopy  10/05/2010    Procedure: ESOPHAGOGASTRODUODENOSCOPY (EGD);  Surgeon: Malissa HippoNajeeb U Rehman, MD;  Location: [facility];  Service: Endoscopy;  Laterality: N/A;  7:30 am    Family History  Problem Relation Age of Onset  . Cancer Mother   . Stroke Father     History  Substance Use Topics  . Smoking status: Never Smoker   . Smokeless tobacco: Never Used  . Alcohol Use: No    OB History   Grav Para Term Preterm Abortions TAB SAB Ect Mult Living                   Review of  Systems A complete 10 system review of systems was obtained and all systems are negative except as noted in the HPI and PMH.     Allergies  Penicillins  Home Medications   Prior to Admission medications   Medication Sig Start Date End Date Taking? Authorizing Provider  acetaminophen (TYLENOL) 500 MG tablet Take 1,000 mg by mouth once.    Historical Provider, MD  ALPRAZolam Prudy Feeler(XANAX) 1 MG tablet Take 1 mg by mouth at bedtime as needed for sleep.    Historical Provider, MD  ciprofloxacin (CIPRO) 500 MG tablet Take 1 tablet (500 mg total) by mouth 2 (two) times daily. 05/28/13   Deatra CanterWilliam J Oxford, FNP  diltiazem (CARDIZEM CD) 240 MG 24 hr capsule Take 240 mg by mouth daily. 03/14/13   Historical Provider, MD  fluconazole (DIFLUCAN) 150 MG tablet Take 1 tablet (150 mg total) by mouth once. 05/28/13   Deatra CanterWilliam J Oxford, FNP  gabapentin (NEURONTIN) 100 MG capsule Take 100 mg by mouth 3 (three) times daily.    Historical Provider, MD  mesalamine (CANASA) 1000 MG suppository Place 1 suppository (1,000 mg total) rectally at bedtime. 10/22/10 10/22/11  Malissa HippoNajeeb U Rehman, MD  mesalamine (CANASA) 1000 MG suppository Place 1  suppository (1,000 mg total) rectally at bedtime. 11/22/12   Len Blalock, NP  omeprazole (PRILOSEC) 20 MG capsule Take 1 capsule (20 mg total) by mouth 2 (two) times daily before a meal. 09/02/10 09/02/11  Len Blalock, NP  sucralfate (CARAFATE) 1 G tablet Take 1 tablet (1 g total) by mouth 4 (four) times daily. 10/05/10 10/05/11  [name], MD  traMADol (ULTRAM) 50 MG tablet Take 1 tablet (50 mg total) by mouth every 12 (twelve) hours as needed. 06/07/13   Mae Shelda Altes, FNP   BP 157/106  Pulse 89  Temp(Src) 97.9 F (36.6 C) (Oral)  Resp 20  Ht 5\' 5"  (1.651 m)  Wt 234 lb (106.142 kg)  BMI 38.94 kg/m2  SpO2 100%  Physical Exam  Nursing note and vitals reviewed. Constitutional: She is oriented to person, place, and time. She appears well-developed and well-nourished.  HENT:   Head: Normocephalic and atraumatic.  Eyes: Conjunctivae and EOM are normal. Pupils are equal, round, and reactive to light.  Neck: Normal range of motion. Neck supple.  Cardiovascular: Normal rate, regular rhythm and normal heart sounds.   Pulmonary/Chest: Effort normal and breath sounds normal.  Abdominal: Soft. Bowel sounds are normal. There is tenderness in the epigastric area. There is CVA tenderness.  Minimal CVA tenderness bilaterally Minimal epigastric tenderness  Musculoskeletal: Normal range of motion.  Neurological: She is alert and oriented to person, place, and time.  Skin: Skin is warm and dry.  Psychiatric: She has a normal mood and affect. Her behavior is normal.    ED Course  Procedures (including critical care time)  DIAGNOSTIC STUDIES: Oxygen Saturation is 100% on room air, normal by my interpretation.    COORDINATION OF CARE: 2:10 PM-Discussed treatment plan which includes bloodwork and IV fluids with pt at bedside and pt agreed to plan.     Results for orders placed during the hospital encounter of 06/09/13  CBC WITH DIFFERENTIAL      Result Value Ref Range   WBC 2.8 (*) 4.0 - 10.5 K/uL   RBC 3.69 (*) 3.87 - 5.11 MIL/uL   Hemoglobin 10.5 (*) 12.0 - 15.0 g/dL   HCT 62.8 (*) 31.5 - 17.6 %   MCV 85.1  78.0 - 100.0 fL   MCH 28.5  26.0 - 34.0 pg   MCHC 33.4  30.0 - 36.0 g/dL   RDW 16.0  73.7 - 10.6 %   Platelets 62 (*) 150 - 400 K/uL   Neutrophils Relative % 58  43 - 77 %   Lymphocytes Relative 25  12 - 46 %   Monocytes Relative 15 (*) 3 - 12 %   Eosinophils Relative 2  0 - 5 %   Basophils Relative 0  0 - 1 %   Neutro Abs 1.6 (*) 1.7 - 7.7 K/uL   Lymphs Abs 0.7  0.7 - 4.0 K/uL   Monocytes Absolute 0.4  0.1 - 1.0 K/uL   Eosinophils Absolute 0.1  0.0 - 0.7 K/uL   Basophils Absolute 0.0  0.0 - 0.1 K/uL   Smear Review PLATELETS APPEAR DECREASED    BASIC METABOLIC PANEL      Result Value Ref Range   Sodium 138  137 - 147 mEq/L   Potassium 3.7  3.7 - 5.3  mEq/L   Chloride 102  96 - 112 mEq/L   CO2 25  19 - 32 mEq/L   Glucose, Bld 94  70 - 99 mg/dL   BUN 12  6 - 23 mg/dL   Creatinine, Ser 4.09  0.50 - 1.10 mg/dL   Calcium 8.9  8.4 - 81.1 mg/dL   GFR calc non Af Amer 65 (*) >90 mL/min   GFR calc Af Amer 76 (*) >90 mL/min  HEPATIC FUNCTION PANEL      Result Value Ref Range   Total Protein 6.6  6.0 - 8.3 g/dL   Albumin 3.1 (*) 3.5 - 5.2 g/dL   AST 38 (*) 0 - 37 U/L   ALT 22  0 - 35 U/L   Alkaline Phosphatase 102  39 - 117 U/L   Total Bilirubin 0.7  0.3 - 1.2 mg/dL   Bilirubin, Direct 0.2  0.0 - 0.3 mg/dL   Indirect Bilirubin 0.5  0.3 - 0.9 mg/dL  LIPASE, BLOOD      Result Value Ref Range   Lipase 96 (*) 11 - 59 U/L  URINALYSIS, ROUTINE W REFLEX MICROSCOPIC      Result Value Ref Range   Color, Urine YELLOW  YELLOW   APPearance CLEAR  CLEAR   Specific Gravity, Urine 1.010  1.005 - 1.030   pH 6.0  5.0 - 8.0   Glucose, UA NEGATIVE  NEGATIVE mg/dL   Hgb urine dipstick TRACE (*) NEGATIVE   Bilirubin Urine NEGATIVE  NEGATIVE   Ketones, ur NEGATIVE  NEGATIVE mg/dL   Protein, ur NEGATIVE  NEGATIVE mg/dL   Urobilinogen, UA 0.2  0.0 - 1.0 mg/dL   Nitrite NEGATIVE  NEGATIVE   Leukocytes, UA NEGATIVE  NEGATIVE  URINE MICROSCOPIC-ADD ON      Result Value Ref Range   RBC / HPF 0-2  <3 RBC/hpf      MDM   Final diagnoses:  Cirrhosis  Weakness  Elevated lipase    No acute abdomen. Lipase noted to be slightly elevated. Blood work shows a pancytopenia. CT scan of abdomen pelvis shows cirrhosis with a small amount of perihepatic fluid.   Splenomegaly is not new. Patient will followup with gastroenterologist this week   I personally performed the services described in this documentation, which was scribed in my presence. The recorded information has been reviewed and is accurate.    Donnetta Hutching, MD 06/09/13 2050

## 2013-06-09 NOTE — ED Notes (Signed)
Pt reports feels generally weak, blurred vision, difficulty concentrating since Friday.  Also c/o muslce cramps.  Reports last year felt similar to this and was diagnosed with renal failure and hyponatremia.  Reports has hepatitis C cirrhosis and has ileostomy.  Pt also c/o headache.

## 2013-06-09 NOTE — ED Notes (Signed)
Pt finished contrast.  Pt no distress. Resting quietly.

## 2013-06-12 ENCOUNTER — Encounter (INDEPENDENT_AMBULATORY_CARE_PROVIDER_SITE_OTHER): Payer: Self-pay | Admitting: Internal Medicine

## 2013-06-12 DIAGNOSIS — R1013 Epigastric pain: Secondary | ICD-10-CM

## 2013-06-13 ENCOUNTER — Other Ambulatory Visit: Payer: Self-pay | Admitting: Family Medicine

## 2013-06-13 ENCOUNTER — Telehealth: Payer: Self-pay | Admitting: Family Medicine

## 2013-06-13 ENCOUNTER — Ambulatory Visit (INDEPENDENT_AMBULATORY_CARE_PROVIDER_SITE_OTHER): Payer: Medicare HMO | Admitting: Internal Medicine

## 2013-06-13 ENCOUNTER — Encounter (INDEPENDENT_AMBULATORY_CARE_PROVIDER_SITE_OTHER): Payer: Self-pay | Admitting: Internal Medicine

## 2013-06-13 VITALS — BP 142/80 | HR 64 | Temp 98.1°F | Ht 65.5 in | Wt 235.7 lb

## 2013-06-13 DIAGNOSIS — K5090 Crohn's disease, unspecified, without complications: Secondary | ICD-10-CM

## 2013-06-13 DIAGNOSIS — B1920 Unspecified viral hepatitis C without hepatic coma: Secondary | ICD-10-CM

## 2013-06-13 DIAGNOSIS — R1013 Epigastric pain: Secondary | ICD-10-CM

## 2013-06-13 LAB — LIPASE: Lipase: 111 U/L — ABNORMAL HIGH (ref 0–75)

## 2013-06-13 NOTE — Telephone Encounter (Signed)
NTBS.

## 2013-06-13 NOTE — Progress Notes (Signed)
Subjective:     Patient ID: Kathryn [name], female   DOB: 1955/03/14, 58 y.o.   MRN: [id number]  HPI Recently seen in the ED on 07/06/2013 with persistent progressively worsening generalized weakness with headache, nausea and vomiting. Intermittent muscle cramps. Noted lipase was slightly elevated. Given 1 liter of IV fluids. Hx of Hepatitis C.  She is genotype 1A.  Hx of Crohbn Disease. She had a subtotal colectomy ileostomy in 2004. Diagnosed with Crohn's in 1985. Presently not taking any medication for her Crohn's disease. 11/26/2012: Liver biopsy; Chronic Hepatitis. Viral Type C. Mildly active. Less than 5% hepatic steatosis present.   She says she feels good. She feels some pressure.  Appetite has been good. No weight loss. She usually empties her bag 5-6 times a day.  No abdominal pain. Presently waiting approval for Hepatitis C.   09/2010 EGD:Dr. Karilyn Cotaehman. Rt upper quadrant pain. Impression:  Multiple gastric antral erosions. Biopsy taken for histology.  Differential diagnosis includes H. pylori gastritis NSAID gastropathy vs gastric Crohn's disease.   Biopsy results reviewed with the patient. Does not show any changes of gastric Crohn disease and special stains negative for H. pylori infection. Patient has not experienced any more episodes of melena. Her hemoglobin on 09/02/2010 is 11.5 . Now she is complaining of rectal discharge and bleeding. She states she's had this off-and-on for a few years but it has increased lately.     CBC    Component Value Date/Time   WBC 2.8* 06/09/2013 1411   RBC 3.69* 06/09/2013 1411   HGB 10.5* 06/09/2013 1411   HCT 31.4* 06/09/2013 1411   PLT 62* 06/09/2013 1411   MCV 85.1 06/09/2013 1411   MCH 28.5 06/09/2013 1411   MCHC 33.4 06/09/2013 1411   RDW 15.2 06/09/2013 1411   LYMPHSABS 0.7 06/09/2013 1411   MONOABS 0.4 06/09/2013 1411   EOSABS 0.1 06/09/2013 1411   BASOSABS 0.0 06/09/2013 1411    CMP     Component Value Date/Time   NA 138 06/09/2013 1411   K 3.7  06/09/2013 1411   CL 102 06/09/2013 1411   CO2 25 06/09/2013 1411   GLUCOSE 94 06/09/2013 1411   BUN 12 06/09/2013 1411   CREATININE 0.95 06/09/2013 1411   CREATININE 1.00 11/14/2012 1434   CALCIUM 8.9 06/09/2013 1411   PROT 6.6 06/09/2013 1411   ALBUMIN 3.1* 06/09/2013 1411   AST 38* 06/09/2013 1411   ALT 22 06/09/2013 1411   ALKPHOS 102 06/09/2013 1411   BILITOT 0.7 06/09/2013 1411   GFRNONAA 65* 06/09/2013 1411   GFRAA 76* 06/09/2013 1411    Urinalysis    Component Value Date/Time   COLORURINE YELLOW 06/09/2013 1544   APPEARANCEUR CLEAR 06/09/2013 1544   LABSPEC 1.010 06/09/2013 1544   PHURINE 6.0 06/09/2013 1544   GLUCOSEU NEGATIVE 06/09/2013 1544   HGBUR TRACE* 06/09/2013 1544   BILIRUBINUR NEGATIVE 06/09/2013 1544   BILIRUBINUR neg 05/28/2013 1055   KETONESUR NEGATIVE 06/09/2013 1544   PROTEINUR NEGATIVE 06/09/2013 1544   PROTEINUR neg 05/28/2013 1055   UROBILINOGEN 0.2 06/09/2013 1544   UROBILINOGEN negative 05/28/2013 1055   NITRITE NEGATIVE 06/09/2013 1544   NITRITE neg 05/28/2013 1055   LEUKOCYTESUR NEGATIVE 06/09/2013 1544   Lipase     Component Value Date/Time   LIPASE 96* 06/09/2013 1411                Review of Systems Past Medical History  Diagnosis Date  . Hypertension   . Enteritis presumed infectious   .  Crohn's   . Crohn's   . Anemia   . Hepatitis C antibody test positive     Past Surgical History  Procedure Laterality Date  . Colon surgery      MULTIPLE SURGERIES FOR CROHNS  . Ileostomy      multiple abdominal surgeries for Crohn's by Dr. Gabriel Cirri  . Cholecystectomy    . Abscess drainage      abdominal  . Esophagogastroduodenoscopy  10/05/2010    Procedure: ESOPHAGOGASTRODUODENOSCOPY (EGD);  Surgeon: [name], MD;  Location: [facility];  Service: Endoscopy;  Laterality: N/A;  7:30 am    Allergies  Allergen Reactions  . Penicillins Rash    Current Outpatient Prescriptions on File Prior to Visit  Medication Sig Dispense Refill  . acetaminophen (TYLENOL) 500 MG  tablet Take 1,000 mg by mouth every 6 (six) hours as needed for mild pain.       Marland Kitchen ALPRAZolam (XANAX) 1 MG tablet Take 1 mg by mouth at bedtime as needed for sleep.      Marland Kitchen diltiazem (CARDIZEM CD) 240 MG 24 hr capsule Take 240 mg by mouth daily.      Marland Kitchen gabapentin (NEURONTIN) 100 MG capsule Take 100 mg by mouth 3 (three) times daily.      . Multiple Vitamins-Minerals (MULTIVITAMIN WITH MINERALS) tablet Take 1 tablet by mouth daily.      Marland Kitchen omeprazole (PRILOSEC) 20 MG capsule Take 20 mg by mouth daily as needed (Acid Reflux).      . Probiotic Product (PROBIOTIC DAILY PO) Take 1 capsule by mouth daily.      . traMADol (ULTRAM) 50 MG tablet Take 50 mg by mouth 2 (two) times daily as needed for moderate pain.       No current facility-administered medications on file prior to visit.        Objective:   Physical Exam  Filed Vitals:   06/13/13 0956  BP: 142/80  Pulse: 64  Temp: 98.1 F (36.7 C)  Height: 5' 5.5" (1.664 m)  Weight: 235 lb 11.2 oz (106.913 kg)  Alert and oriented. Skin warm and dry. Oral mucosa is moist.   . Sclera anicteric, conjunctivae is pink. Thyroid not enlarged. No cervical lymphadenopathy. Lungs clear. Heart regular rate and rhythm.  Abdomen is soft. Bowel sounds are positive. No hepatomegaly. No abdominal masses felt. No tenderness.  No edema to lower extremities.        Assessment:     Hepatitis C: Awaiting treatment. Crohn's disease: Presently in remission. Underwent a partial colectomy in 2004. Elevated lipase: will repeat today.    Plan:     OV pending. Lipase.

## 2013-06-13 NOTE — Patient Instructions (Signed)
Lipase today. Patient waiting for assistance.

## 2013-06-14 ENCOUNTER — Other Ambulatory Visit: Payer: Self-pay | Admitting: General Practice

## 2013-06-14 NOTE — Telephone Encounter (Signed)
Please call in xanax with 1 refills 

## 2013-06-14 NOTE — Addendum Note (Signed)
Addended by: Len BlalockSETZER, TERRI L on: 06/14/2013 08:36 AM   Modules accepted: Orders

## 2013-06-14 NOTE — Telephone Encounter (Signed)
Ordered

## 2013-06-14 NOTE — Telephone Encounter (Signed)
Xanax 1mg  po QHS-#30 1 refill

## 2013-06-14 NOTE — Telephone Encounter (Signed)
Lipase for Monday. Order has been faxed to lab

## 2013-06-17 NOTE — Telephone Encounter (Signed)
Called in.

## 2013-06-18 LAB — LIPASE: Lipase: 138 U/L — ABNORMAL HIGH (ref 0–75)

## 2013-06-20 ENCOUNTER — Ambulatory Visit (HOSPITAL_COMMUNITY)
Admission: RE | Admit: 2013-06-20 | Discharge: 2013-06-20 | Disposition: A | Discharge status: Home / Self Care | Payer: Medicare HMO | Source: Ambulatory Visit | Attending: Internal Medicine | Admitting: Internal Medicine

## 2013-06-20 DIAGNOSIS — K7460 Unspecified cirrhosis of liver: Secondary | ICD-10-CM | POA: Insufficient documentation

## 2013-06-20 DIAGNOSIS — R10816 Epigastric abdominal tenderness: Secondary | ICD-10-CM

## 2013-06-20 DIAGNOSIS — R748 Abnormal levels of other serum enzymes: Secondary | ICD-10-CM

## 2013-06-20 DIAGNOSIS — R161 Splenomegaly, not elsewhere classified: Secondary | ICD-10-CM | POA: Insufficient documentation

## 2013-06-20 NOTE — Telephone Encounter (Signed)
Feel better. Slight epigastric tenderness. 80%.  I advised her not to take her medication for Hep C till she calls me and talks to me.

## 2013-06-21 ENCOUNTER — Encounter (INDEPENDENT_AMBULATORY_CARE_PROVIDER_SITE_OTHER): Payer: Self-pay | Admitting: Internal Medicine

## 2013-07-02 ENCOUNTER — Encounter (INDEPENDENT_AMBULATORY_CARE_PROVIDER_SITE_OTHER): Payer: Self-pay | Admitting: Internal Medicine

## 2013-07-03 ENCOUNTER — Telehealth (INDEPENDENT_AMBULATORY_CARE_PROVIDER_SITE_OTHER): Payer: Self-pay | Admitting: *Deleted

## 2013-07-03 NOTE — Telephone Encounter (Signed)
Patient has called several times about her Hep C Medication. We have had setbacks, due to her applying and the awaiting outcomes for assistance. I have called [facility] and spoke with Sandhya,Pharmacist.  Patient is to call [phone] after lunch our time to arrange the shipment. The patient 's approval dates are 02-07-2013 / 07-25-13. I ask the patient to please let us know when she starts the medication , so that we may call [employer] to get the next two refills approved. Patient verbalized understanding.

## 2013-07-04 ENCOUNTER — Encounter: Payer: Self-pay | Admitting: Family

## 2013-07-04 ENCOUNTER — Ambulatory Visit (INDEPENDENT_AMBULATORY_CARE_PROVIDER_SITE_OTHER): Payer: Medicare HMO | Admitting: Family

## 2013-07-04 VITALS — BP 133/85 | HR 80 | Temp 97.3°F | Ht 65.0 in | Wt 233.4 lb

## 2013-07-04 DIAGNOSIS — G629 Polyneuropathy, unspecified: Secondary | ICD-10-CM

## 2013-07-04 DIAGNOSIS — Z23 Encounter for immunization: Secondary | ICD-10-CM

## 2013-07-04 DIAGNOSIS — K7460 Unspecified cirrhosis of liver: Secondary | ICD-10-CM

## 2013-07-04 DIAGNOSIS — G4700 Insomnia, unspecified: Secondary | ICD-10-CM

## 2013-07-04 DIAGNOSIS — I10 Essential (primary) hypertension: Secondary | ICD-10-CM

## 2013-07-04 DIAGNOSIS — K219 Gastro-esophageal reflux disease without esophagitis: Secondary | ICD-10-CM

## 2013-07-04 DIAGNOSIS — G609 Hereditary and idiopathic neuropathy, unspecified: Secondary | ICD-10-CM

## 2013-07-04 MED ORDER — DILTIAZEM HCL ER COATED BEADS 240 MG PO CP24: 240.0000 mg | ORAL_CAPSULE | Freq: Every day | ORAL | Status: DC

## 2013-07-04 NOTE — Patient Instructions (Signed)

## 2013-07-04 NOTE — Progress Notes (Signed)
Subjective:    Patient ID: Kathryn [name], female    DOB: [birth year], 58 y.o.   MRN: [id number]  Hypertension This is a chronic problem. The current episode started more than 1 year ago. The problem has been resolved since onset. The problem is controlled. Associated symptoms include peripheral edema ("comes and goes"). Pertinent negatives include no anxiety, headaches, palpitations or shortness of breath. Risk factors for coronary artery disease include obesity, post-menopausal state and family history. Past treatments include calcium channel blockers. The current treatment provides moderate improvement. There is no history of kidney disease or a thyroid problem. There is no history of sleep apnea.  Gastrophageal Reflux She reports no belching, no coughing, no heartburn or no nausea. This is a chronic problem. The current episode started more than 1 year ago. The problem occurs rarely. The problem has been resolved. The symptoms are aggravated by certain foods. Pertinent negatives include no fatigue or muscle weakness. She has tried a PPI for the symptoms. The treatment provided significant relief. Past invasive treatments do not include reflux surgery.  Peripheral Neuropathy  Pt currently takes neurontin TID. Pt states it helps and has "improved her balance". Pt states that before her legs would go numb and she would fall. That has improved Insomnia  Pt currently takes Xanax 76m every night at bedtime. Pt states it is working well with no complaints at this time.   *Pt has been diagnosed with cirrhosis and see Dr. RLaural Goldenin RDuncannon      Review of Systems  Constitutional: Negative for fatigue.  HENT: Negative.   Respiratory: Negative for cough and shortness of breath.   Cardiovascular: Negative for palpitations.  Gastrointestinal: Negative for heartburn and nausea.  Genitourinary: Negative.   Musculoskeletal: Negative.  Negative for muscle weakness.  Neurological: Negative for  headaches.  Hematological: Negative.   Psychiatric/Behavioral: Negative.   All other systems reviewed and are negative.      Objective:   Physical Exam  Vitals reviewed. Constitutional: She is oriented to person, place, and time. She appears well-developed and well-nourished. No distress.  HENT:  Head: Normocephalic and atraumatic.  Right Ear: External ear normal.  Mouth/Throat: Oropharynx is clear and moist.  Eyes: Pupils are equal, round, and reactive to light.  Neck: Normal range of motion. Neck supple. No thyromegaly present.  Cardiovascular: Normal rate, regular rhythm, normal heart sounds and intact distal pulses.   No murmur heard. Pulmonary/Chest: Effort normal and breath sounds normal. No respiratory distress. She has no wheezes.  Abdominal: Soft. Bowel sounds are normal. She exhibits no distension. There is no tenderness.  Musculoskeletal: Normal range of motion. She exhibits edema (MIld 1 edema). She exhibits no tenderness.  Neurological: She is alert and oriented to person, place, and time. She has normal reflexes. No cranial nerve deficit.  Skin: Skin is warm and dry.  Psychiatric: She has a normal mood and affect. Her behavior is normal. Judgment and thought content normal.      BP 133/85  Pulse 80  Temp(Src) 97.3 F (36.3 C) (Oral)  Ht 5' 5"  (1.651 m)  Wt 233 lb 6.4 oz (105.87 kg)  BMI 38.84 kg/m2     Assessment & Plan:  1. Need for Tdap vaccination - Tdap vaccine greater than or equal to 7yo IM  2. Hypertension - diltiazem (CARDIZEM CD) 240 MG 24 hr capsule; Take 1 capsule (240 mg total) by mouth daily.  Dispense: 90 capsule; Refill: 3 - CMP14+EGFR  3. Cirrhosis of liver without mention  of alcohol - Vit D  25 hydroxy (rtn osteoporosis monitoring) - Thyroid Panel With TSH  4. GERD (gastroesophageal reflux disease)  5. Peripheral neuropathy  6. Insomnia   Continue all meds Labs pending Health Maintenance reviewed Keep all appointments with  Gastroenterology Diet and exercise encouraged RTO 3 months  [name], FNP

## 2013-07-05 ENCOUNTER — Telehealth: Payer: Self-pay | Admitting: Family Medicine

## 2013-07-05 LAB — CMP14+EGFR
A/G RATIO: 1.3 (ref 1.1–2.5)
ALK PHOS: 132 IU/L — AB (ref 39–117)
ALT: 30 IU/L (ref 0–32)
AST: 45 IU/L — ABNORMAL HIGH (ref 0–40)
Albumin: 3.5 g/dL (ref 3.5–5.5)
BILIRUBIN TOTAL: 0.5 mg/dL (ref 0.0–1.2)
BUN/Creatinine Ratio: 12 (ref 9–23)
BUN: 14 mg/dL (ref 6–24)
CO2: 26 mmol/L (ref 18–29)
Calcium: 9.3 mg/dL (ref 8.7–10.2)
Chloride: 101 mmol/L (ref 97–108)
Creatinine, Ser: 1.2 mg/dL — ABNORMAL HIGH (ref 0.57–1.00)
GFR calc non Af Amer: 50 mL/min/{1.73_m2} — ABNORMAL LOW (ref >59)
GFR, EST AFRICAN AMERICAN: 58 mL/min/{1.73_m2} — AB (ref >59)
Globulin, Total: 2.6 g/dL (ref 1.5–4.5)
Glucose: 103 mg/dL — ABNORMAL HIGH (ref 65–99)
POTASSIUM: 4.6 mmol/L (ref 3.5–5.2)
SODIUM: 137 mmol/L (ref 134–144)
TOTAL PROTEIN: 6.1 g/dL (ref 6.0–8.5)

## 2013-07-05 LAB — THYROID PANEL WITH TSH
Free Thyroxine Index: 2.5 (ref 1.2–4.9)
T3 UPTAKE RATIO: 22 % — AB (ref 24–39)
T4, Total: 11.4 ug/dL (ref 4.5–12.0)
TSH: 1.16 u[IU]/mL (ref 0.450–4.500)

## 2013-07-05 LAB — VITAMIN D 25 HYDROXY (VIT D DEFICIENCY, FRACTURES): Vit D, 25-Hydroxy: 33.4 ng/mL (ref 30.0–100.0)

## 2013-07-05 NOTE — Telephone Encounter (Signed)
Message copied by Azalee Course on Fri Jul 05, 2013  2:24 PM ------      Message from: [place], [place]      Created: Fri Jul 05, 2013  1:45 PM       Kidney & liver function trending upward-Keep all appointments with specialist       Vit D levels on the lower side of normal. Would benefit from Vit D OTC      Thyroid levels WNL ------

## 2013-07-08 ENCOUNTER — Telehealth (INDEPENDENT_AMBULATORY_CARE_PROVIDER_SITE_OTHER): Payer: Self-pay | Admitting: *Deleted

## 2013-07-08 NOTE — Telephone Encounter (Signed)
LM to let Tammy and Terri know she has received her Harvoni medicine. The return phone number is [phone] and after 12:00 pm call [phone].

## 2013-07-09 ENCOUNTER — Encounter (INDEPENDENT_AMBULATORY_CARE_PROVIDER_SITE_OTHER): Payer: Self-pay | Admitting: Internal Medicine

## 2013-07-09 NOTE — Telephone Encounter (Signed)
Apt has been scheduled for 08/07/13 with Dorene Ar, NP.

## 2013-07-09 NOTE — Telephone Encounter (Signed)
Will start Harvoni 07/09/2013  Tammy: CBC., CMET and HCV RNA Quaint in 4 weeks.  Lupita Leash, OV in 4 weeks

## 2013-07-09 NOTE — Telephone Encounter (Signed)
Started 07/09/2013   Rivka Saferonna, OV in 4 weeks. Tammy: CBC,CMET, and Quaint in 4 weeks.

## 2013-07-11 ENCOUNTER — Telehealth (INDEPENDENT_AMBULATORY_CARE_PROVIDER_SITE_OTHER): Payer: Self-pay | Admitting: *Deleted

## 2013-07-11 DIAGNOSIS — B1920 Unspecified viral hepatitis C without hepatic coma: Secondary | ICD-10-CM

## 2013-07-11 NOTE — Telephone Encounter (Signed)
Apt has been scheduled for 08/07/13 with Terri Setzer, NP. 

## 2013-07-11 NOTE — Telephone Encounter (Signed)
Lab work is noted for 08-06-13.

## 2013-07-11 NOTE — Telephone Encounter (Signed)
.  Per Kathryn [name] patient stared her Hep C treatment 06-02-015. She will need labs in 4 weeks, and a office visit in 4 weeks. Labs are planned for June 30,2015

## 2013-07-12 ENCOUNTER — Other Ambulatory Visit: Payer: Self-pay | Admitting: *Deleted

## 2013-07-12 ENCOUNTER — Encounter (INDEPENDENT_AMBULATORY_CARE_PROVIDER_SITE_OTHER): Payer: Self-pay | Admitting: *Deleted

## 2013-07-12 ENCOUNTER — Other Ambulatory Visit (INDEPENDENT_AMBULATORY_CARE_PROVIDER_SITE_OTHER): Payer: Self-pay | Admitting: *Deleted

## 2013-07-12 ENCOUNTER — Telehealth (INDEPENDENT_AMBULATORY_CARE_PROVIDER_SITE_OTHER): Payer: Self-pay | Admitting: *Deleted

## 2013-07-12 DIAGNOSIS — K7460 Unspecified cirrhosis of liver: Secondary | ICD-10-CM

## 2013-07-12 DIAGNOSIS — B1920 Unspecified viral hepatitis C without hepatic coma: Secondary | ICD-10-CM

## 2013-07-12 DIAGNOSIS — K5090 Crohn's disease, unspecified, without complications: Secondary | ICD-10-CM

## 2013-07-12 MED ORDER — TRAMADOL HCL 50 MG PO TABS: 50.0000 mg | ORAL_TABLET | Freq: Two times a day (BID) | ORAL | Status: DC | PRN

## 2013-07-12 NOTE — Telephone Encounter (Signed)
Patient last seen in office on 07-04-13. Rx last filled on 06-10-13 for #60. Please advise on refill. If approved please print and route to Pool A so nurse can call patient to pick up

## 2013-07-12 NOTE — Telephone Encounter (Signed)
.  Per Lelon Perla patient to have a B-Met in 2 weeks.  This was noted on a patient advice email to [facility].

## 2013-07-12 NOTE — Telephone Encounter (Signed)
Patient aware to pick up 

## 2013-07-16 ENCOUNTER — Encounter (INDEPENDENT_AMBULATORY_CARE_PROVIDER_SITE_OTHER): Payer: Self-pay | Admitting: Internal Medicine

## 2013-07-19 ENCOUNTER — Encounter: Payer: Self-pay | Admitting: *Deleted

## 2013-07-26 LAB — CBC WITH DIFFERENTIAL/PLATELET
BASOS ABS: 0 10*3/uL (ref 0.0–0.1)
Basophils Relative: 1 % (ref 0–1)
Eosinophils Absolute: 0.3 10*3/uL (ref 0.0–0.7)
Eosinophils Relative: 8 % — ABNORMAL HIGH (ref 0–5)
HCT: 30.6 % — ABNORMAL LOW (ref 36.0–46.0)
Hemoglobin: 10.4 g/dL — ABNORMAL LOW (ref 12.0–15.0)
LYMPHS PCT: 23 % (ref 12–46)
Lymphs Abs: 0.9 10*3/uL (ref 0.7–4.0)
MCH: 28.7 pg (ref 26.0–34.0)
MCHC: 34 g/dL (ref 30.0–36.0)
MCV: 84.3 fL (ref 78.0–100.0)
Monocytes Absolute: 0.5 10*3/uL (ref 0.1–1.0)
Monocytes Relative: 14 % — ABNORMAL HIGH (ref 3–12)
NEUTROS ABS: 2 10*3/uL (ref 1.7–7.7)
Neutrophils Relative %: 54 % (ref 43–77)
PLATELETS: 87 10*3/uL — AB (ref 150–400)
RBC: 3.63 MIL/uL — ABNORMAL LOW (ref 3.87–5.11)
RDW: 15.8 % — ABNORMAL HIGH (ref 11.5–15.5)
WBC: 3.7 10*3/uL — AB (ref 4.0–10.5)

## 2013-07-26 LAB — COMPREHENSIVE METABOLIC PANEL
ALT: 10 U/L (ref 0–35)
AST: 21 U/L (ref 0–37)
Albumin: 3.3 g/dL — ABNORMAL LOW (ref 3.5–5.2)
Alkaline Phosphatase: 112 U/L (ref 39–117)
BUN: 14 mg/dL (ref 6–23)
CALCIUM: 8.3 mg/dL — AB (ref 8.4–10.5)
CHLORIDE: 104 meq/L (ref 96–112)
CO2: 24 mEq/L (ref 19–32)
CREATININE: 1.07 mg/dL (ref 0.50–1.10)
Glucose, Bld: 98 mg/dL (ref 70–99)
Potassium: 4.5 mEq/L (ref 3.5–5.3)
SODIUM: 133 meq/L — AB (ref 135–145)
TOTAL PROTEIN: 6.3 g/dL (ref 6.0–8.3)
Total Bilirubin: 0.7 mg/dL (ref 0.2–1.2)

## 2013-07-29 LAB — HEPATITIS C RNA QUANTITATIVE: HCV Quantitative: NOT DETECTED IU/mL (ref <15)

## 2013-08-07 ENCOUNTER — Ambulatory Visit (INDEPENDENT_AMBULATORY_CARE_PROVIDER_SITE_OTHER): Payer: Medicare HMO | Admitting: Internal Medicine

## 2013-08-07 ENCOUNTER — Encounter (INDEPENDENT_AMBULATORY_CARE_PROVIDER_SITE_OTHER): Payer: Self-pay | Admitting: Internal Medicine

## 2013-08-07 VITALS — BP 132/70 | HR 80 | Temp 98.0°F | Ht 65.5 in | Wt 231.4 lb

## 2013-08-07 DIAGNOSIS — K5090 Crohn's disease, unspecified, without complications: Secondary | ICD-10-CM

## 2013-08-07 DIAGNOSIS — B182 Chronic viral hepatitis C: Secondary | ICD-10-CM

## 2013-08-07 LAB — BASIC METABOLIC PANEL
BUN: 16 mg/dL (ref 6–23)
CO2: 24 meq/L (ref 19–32)
Calcium: 8.7 mg/dL (ref 8.4–10.5)
Chloride: 104 mEq/L (ref 96–112)
Creat: 1.21 mg/dL — ABNORMAL HIGH (ref 0.50–1.10)
Glucose, Bld: 106 mg/dL — ABNORMAL HIGH (ref 70–99)
POTASSIUM: 4.4 meq/L (ref 3.5–5.3)
Sodium: 137 mEq/L (ref 135–145)

## 2013-08-07 NOTE — Progress Notes (Signed)
Subjective:     Patient ID: Kathryn [name], female   DOB: 07-Nov-1955, 58 y.o.   MRN: [id number]  HPI Here today for f/u of her Hepatitis C. Started Harvoni 07/09/2013. Hx of Hepatitis C.  One tattoo.  She is genotype 1A.  11/26/2012: Liver biopsy; Chronic Hepatitis. Viral Type C. Mildly active. Less than 5% hepatic steatosis present.  She tells me she has been feeling fine.  Appetite is good. No weight loss. No abdominal pain. BMs are normal. She usually empties colostomy about 5-6 times a day.' No melena or bright red bleeding.  No fever.  She has a small draining area next to her colostomy x 2 weeks.    Hx of Crohn Disease. She had a subtotal colectomy ileostomy in 2004. Diagnosed with Crohn's in 1985. Presently not taking any medication for her Crohn's disease.     Hep C Quaint undetected.  CBC    Component Value Date/Time   WBC 3.7* 07/25/2013 1458   RBC 3.63* 07/25/2013 1458   HGB 10.4* 07/25/2013 1458   HCT 30.6* 07/25/2013 1458   PLT 87* 07/25/2013 1458   MCV 84.3 07/25/2013 1458   MCH 28.7 07/25/2013 1458   MCHC 34.0 07/25/2013 1458   RDW 15.8* 07/25/2013 1458   LYMPHSABS 0.9 07/25/2013 1458   MONOABS 0.5 07/25/2013 1458   EOSABS 0.3 07/25/2013 1458   BASOSABS 0.0 07/25/2013 1458   CMP     Component Value Date/Time   NA 137 08/06/2013 1422   NA 137 07/04/2013 0908   K 4.4 08/06/2013 1422   CL 104 08/06/2013 1422   CO2 24 08/06/2013 1422   GLUCOSE 106* 08/06/2013 1422   GLUCOSE 103* 07/04/2013 0908   BUN 16 08/06/2013 1422   BUN 14 07/04/2013 0908   CREATININE 1.21* 08/06/2013 1422   CREATININE 1.20* 07/04/2013 0908   CALCIUM 8.7 08/06/2013 1422   PROT 6.3 07/25/2013 1500   PROT 6.1 07/04/2013 0908   ALBUMIN 3.3* 07/25/2013 1500   AST 21 07/25/2013 1500   ALT 10 07/25/2013 1500   ALKPHOS 112 07/25/2013 1500   BILITOT 0.7 07/25/2013 1500   GFRNONAA 50* 07/04/2013 0908   GFRAA 58* 07/04/2013 0908          Review of Systems Past Medical History  Diagnosis Date  . Hypertension    . Enteritis presumed infectious   . Crohn's   . Crohn's   . Anemia   . Hepatitis C antibody test positive     Past Surgical History  Procedure Laterality Date  . Colon surgery      MULTIPLE SURGERIES FOR CROHNS  . Ileostomy      multiple abdominal surgeries for Crohn's by Dr. Gabriel CirrieMason  . Cholecystectomy    . Abscess drainage      abdominal  . Esophagogastroduodenoscopy  10/05/2010    Procedure: ESOPHAGOGASTRODUODENOSCOPY (EGD);  Surgeon: Malissa HippoNajeeb U Rehman, MD;  Location: [facility];  Service: Endoscopy;  Laterality: N/A;  7:30 am    Allergies  Allergen Reactions  . Penicillins Rash    Current Outpatient Prescriptions on File Prior to Visit  Medication Sig Dispense Refill  . acetaminophen (TYLENOL) 500 MG tablet Take 1,000 mg by mouth every 6 (six) hours as needed for mild pain.       Marland Kitchen. ALPRAZolam (XANAX) 1 MG tablet Take 1 mg by mouth at bedtime as needed for sleep.      Marland Kitchen. diltiazem (CARDIZEM CD) 240 MG 24 hr capsule Take 1 capsule (240 mg  total) by mouth daily.  90 capsule  3  . gabapentin (NEURONTIN) 100 MG capsule Take 100 mg by mouth 3 (three) times daily.      . Multiple Vitamins-Minerals (MULTIVITAMIN WITH MINERALS) tablet Take 1 tablet by mouth daily.      Marland Kitchen omeprazole (PRILOSEC) 20 MG capsule Take 20 mg by mouth daily as needed (Acid Reflux).      . Probiotic Product (PROBIOTIC DAILY PO) Take 1 capsule by mouth daily.      . traMADol (ULTRAM) 50 MG tablet Take 1 tablet (50 mg total) by mouth 2 (two) times daily as needed for moderate pain.  30 tablet  0   No current facility-administered medications on file prior to visit.        Objective:   Physical Exam  Filed Vitals:   08/07/13 1539  BP: 132/70  Pulse: 80  Temp: 98 F (36.7 C)  Height: 5' 5.5" (1.664 m)  Weight: 231 lb 6.4 oz (104.962 kg)   Alert and oriented. Skin warm and dry. Oral mucosa is moist.   . Sclera anicteric, conjunctivae is pink. Thyroid not enlarged. No cervical lymphadenopathy. Lungs  clear. Heart regular rate and rhythm.  Abdomen is soft. Bowel sounds are positive. No hepatomegaly. No abdominal masses felt. No tenderness.  No edema to lower extremities.  Colostomy bag in place with brown liquid stool in bag. Area next to colostomy small opening draining clear drainage.      Assessment:    Hepatitis C.  She has cleared the virus at this point. She is doing well. Crohn's disease.She may have developed a ? Fistula near her colostomy site.    Plan:     OV in 4 weeks. CBC with diff, Hepatic function, Hep C quaint.   She will call pharmacy and tell them to ship her medication of Harvoni.  Clean area next to colostomy with Peroxide and neosporin.

## 2013-08-07 NOTE — Patient Instructions (Signed)
OV in 4 weeks. Clean area near colostomy with peroxide. Apply neopsporin.

## 2013-08-08 ENCOUNTER — Encounter (INDEPENDENT_AMBULATORY_CARE_PROVIDER_SITE_OTHER): Payer: Self-pay | Admitting: *Deleted

## 2013-08-08 ENCOUNTER — Other Ambulatory Visit (INDEPENDENT_AMBULATORY_CARE_PROVIDER_SITE_OTHER): Payer: Self-pay | Admitting: *Deleted

## 2013-08-08 ENCOUNTER — Telehealth (INDEPENDENT_AMBULATORY_CARE_PROVIDER_SITE_OTHER): Payer: Self-pay | Admitting: *Deleted

## 2013-08-08 DIAGNOSIS — B182 Chronic viral hepatitis C: Secondary | ICD-10-CM

## 2013-08-08 NOTE — Telephone Encounter (Signed)
.  Per Delrae Renderri Setzer,NP patient to have labs done in 4 weeks. Noted for 09/04/13.

## 2013-08-12 ENCOUNTER — Encounter (INDEPENDENT_AMBULATORY_CARE_PROVIDER_SITE_OTHER): Payer: Self-pay | Admitting: Internal Medicine

## 2013-08-13 ENCOUNTER — Other Ambulatory Visit: Payer: Self-pay | Admitting: *Deleted

## 2013-08-13 MED ORDER — TRAMADOL HCL 50 MG PO TABS: 50.0000 mg | ORAL_TABLET | Freq: Two times a day (BID) | ORAL | Status: DC | PRN

## 2013-08-13 NOTE — Telephone Encounter (Signed)
Last ov 07/04/13. Last rf 07/12/13. If approved approved print and call pt .

## 2013-08-13 NOTE — Telephone Encounter (Signed)
Patient aware that prescription is ready to be picked up at front desk with photo ID

## 2013-08-16 ENCOUNTER — Telehealth: Payer: Self-pay | Admitting: Family

## 2013-08-19 ENCOUNTER — Telehealth: Payer: Self-pay | Admitting: *Deleted

## 2013-08-19 MED ORDER — ALPRAZOLAM 1 MG PO TABS: 1.0000 mg | ORAL_TABLET | Freq: Every evening | ORAL | Status: DC | PRN

## 2013-08-19 NOTE — Telephone Encounter (Signed)
Left refill authorization on pharmacy voicemail. 

## 2013-08-19 NOTE — Telephone Encounter (Signed)
Aware, script for xanax ready.

## 2013-08-21 LAB — HEPATIC FUNCTION PANEL
ALBUMIN: 3.6 g/dL (ref 3.5–5.2)
ALK PHOS: 142 U/L — AB (ref 39–117)
ALT: 15 U/L (ref 0–35)
AST: 21 U/L (ref 0–37)
Bilirubin, Direct: 0.2 mg/dL (ref 0.0–0.3)
Indirect Bilirubin: 0.4 mg/dL (ref 0.2–1.2)
TOTAL PROTEIN: 6.5 g/dL (ref 6.0–8.3)
Total Bilirubin: 0.6 mg/dL (ref 0.2–1.2)

## 2013-08-21 LAB — CBC WITH DIFFERENTIAL/PLATELET
BASOS ABS: 0 10*3/uL (ref 0.0–0.1)
Basophils Relative: 1 % (ref 0–1)
EOS ABS: 0.2 10*3/uL (ref 0.0–0.7)
Eosinophils Relative: 6 % — ABNORMAL HIGH (ref 0–5)
HEMATOCRIT: 33 % — AB (ref 36.0–46.0)
HEMOGLOBIN: 11.3 g/dL — AB (ref 12.0–15.0)
Lymphocytes Relative: 23 % (ref 12–46)
Lymphs Abs: 0.6 10*3/uL — ABNORMAL LOW (ref 0.7–4.0)
MCH: 28.9 pg (ref 26.0–34.0)
MCHC: 34.2 g/dL (ref 30.0–36.0)
MCV: 84.4 fL (ref 78.0–100.0)
MONO ABS: 0.3 10*3/uL (ref 0.1–1.0)
MONOS PCT: 11 % (ref 3–12)
NEUTROS PCT: 59 % (ref 43–77)
Neutro Abs: 1.6 10*3/uL — ABNORMAL LOW (ref 1.7–7.7)
Platelets: 77 10*3/uL — ABNORMAL LOW (ref 150–400)
RBC: 3.91 MIL/uL (ref 3.87–5.11)
RDW: 15.5 % (ref 11.5–15.5)
WBC: 2.7 10*3/uL — ABNORMAL LOW (ref 4.0–10.5)

## 2013-08-22 LAB — HEPATITIS C RNA QUANTITATIVE: HCV Quantitative: NOT DETECTED IU/mL (ref <15)

## 2013-08-27 ENCOUNTER — Telehealth: Payer: Self-pay | Admitting: Family

## 2013-08-27 ENCOUNTER — Encounter: Payer: Self-pay | Admitting: *Deleted

## 2013-08-27 MED ORDER — GABAPENTIN 100 MG PO CAPS: 100.0000 mg | ORAL_CAPSULE | Freq: Three times a day (TID) | ORAL | Status: DC

## 2013-08-27 NOTE — Telephone Encounter (Signed)
done

## 2013-09-05 ENCOUNTER — Encounter (INDEPENDENT_AMBULATORY_CARE_PROVIDER_SITE_OTHER): Payer: Self-pay | Admitting: Internal Medicine

## 2013-09-05 ENCOUNTER — Ambulatory Visit (INDEPENDENT_AMBULATORY_CARE_PROVIDER_SITE_OTHER): Payer: Medicare HMO | Admitting: Internal Medicine

## 2013-09-05 VITALS — BP 142/70 | HR 72 | Temp 98.3°F | Ht 65.5 in | Wt 233.7 lb

## 2013-09-05 DIAGNOSIS — K5090 Crohn's disease, unspecified, without complications: Secondary | ICD-10-CM

## 2013-09-05 DIAGNOSIS — B182 Chronic viral hepatitis C: Secondary | ICD-10-CM

## 2013-09-05 NOTE — Patient Instructions (Signed)
OV in 6 weeks. 

## 2013-09-05 NOTE — Progress Notes (Signed)
Subjective:     Patient ID: Kathryn [name], female   DOB: [birth year], 58 y.o.   MRN: [id number]  HPI Here today for f/u of her Hepatitis C. This is week week 6 for her.  Hx of Hepatitis C. Risk factors include one tattoo. She is genotype  1A. Started the Harvoni 07/09/2013.    11/26/2012: Liver biopsy; Chronic Hepatitis. Viral Type C. Mildly active. Less than 5% hepatic steatosis present.   She tells me she has been feeling fine.  Appetite is good. No weight loss. No abdominal pain. BMs are normal. She usually empties colostomy about 5-6 times a day.' No melena or bright red bleeding.  No fever.   Hx of Crohn Disease. She had a subtotal colectomy ileostomy in 2004. Diagnosed with Crohn's in 1985. Presently not taking any medication for her Crohn's disease.    CBC    Component Value Date/Time   WBC 2.7* 08/21/2013 1036   RBC 3.91 08/21/2013 1036   HGB 11.3* 08/21/2013 1036   HCT 33.0* 08/21/2013 1036   PLT 77* 08/21/2013 1036   MCV 84.4 08/21/2013 1036   MCH 28.9 08/21/2013 1036   MCHC 34.2 08/21/2013 1036   RDW 15.5 08/21/2013 1036   LYMPHSABS 0.6* 08/21/2013 1036   MONOABS 0.3 08/21/2013 1036   EOSABS 0.2 08/21/2013 1036   BASOSABS 0.0 08/21/2013 1036   CMP     Component Value Date/Time   NA 137 08/06/2013 1422   NA 137 07/04/2013 0908   K 4.4 08/06/2013 1422   CL 104 08/06/2013 1422   CO2 24 08/06/2013 1422   GLUCOSE 106* 08/06/2013 1422   GLUCOSE 103* 07/04/2013 0908   BUN 16 08/06/2013 1422   BUN 14 07/04/2013 0908   CREATININE 1.21* 08/06/2013 1422   CREATININE 1.20* 07/04/2013 0908   CALCIUM 8.7 08/06/2013 1422   PROT 6.5 08/21/2013 1033   PROT 6.1 07/04/2013 0908   ALBUMIN 3.6 08/21/2013 1033   AST 21 08/21/2013 1033   ALT 15 08/21/2013 1033   ALKPHOS 142* 08/21/2013 1033   BILITOT 0.6 08/21/2013 1033   GFRNONAA 50* 07/04/2013 0908   GFRAA 58* 07/04/2013 0908       Review of Systems Past Medical History  Diagnosis Date  . Hypertension   . Enteritis presumed infectious   . Crohn's    . Crohn's   . Anemia   . Hepatitis C antibody test positive     Past Surgical History  Procedure Laterality Date  . Colon surgery      MULTIPLE SURGERIES FOR CROHNS  . Ileostomy      multiple abdominal surgeries for Crohn's by Dr. Gabriel CirrieMason  . Cholecystectomy    . Abscess drainage      abdominal  . Esophagogastroduodenoscopy  10/05/2010    Procedure: ESOPHAGOGASTRODUODENOSCOPY (EGD);  Surgeon: Malissa HippoNajeeb U Rehman, MD;  Location: [facility];  Service: Endoscopy;  Laterality: N/A;  7:30 am    Allergies  Allergen Reactions  . Penicillins Rash    Current Outpatient Prescriptions on File Prior to Visit  Medication Sig Dispense Refill  . acetaminophen (TYLENOL) 500 MG tablet Take 1,000 mg by mouth every 6 (six) hours as needed for mild pain.       Marland Kitchen. ALPRAZolam (XANAX) 1 MG tablet Take 1 tablet (1 mg total) by mouth at bedtime as needed for sleep.  30 tablet  1  . diltiazem (CARDIZEM CD) 240 MG 24 hr capsule Take 1 capsule (240 mg total) by mouth daily.  90  capsule  3  . gabapentin (NEURONTIN) 100 MG capsule Take 1 capsule (100 mg total) by mouth 3 (three) times daily.  270 capsule  0  . Ledipasvir-Sofosbuvir (HARVONI PO) Take by mouth.      . Multiple Vitamins-Minerals (MULTIVITAMIN WITH MINERALS) tablet Take 1 tablet by mouth daily.      Marland Kitchen omeprazole (PRILOSEC) 20 MG capsule Take 20 mg by mouth daily as needed (Acid Reflux).      . Probiotic Product (PROBIOTIC DAILY PO) Take 1 capsule by mouth daily.      . traMADol (ULTRAM) 50 MG tablet Take 1 tablet (50 mg total) by mouth 2 (two) times daily as needed for moderate pain.  30 tablet  1   No current facility-administered medications on file prior to visit.        Objective:   Physical Exam  Filed Vitals:   09/05/13 1616  BP: 142/70  Pulse: 72  Temp: 98.3 F (36.8 C)  Height: 5' 5.5" (1.664 m)  Weight: 233 lb 11.2 oz (106.006 kg)  Alert and oriented. Skin warm and dry. Oral mucosa is moist.   . Sclera anicteric, conjunctivae  is pink. Thyroid not enlarged. No cervical lymphadenopathy. Lungs clear. Heart regular rate and rhythm.  Abdomen is soft. Bowel sounds are positive. No hepatomegaly. No abdominal masses felt. No tenderness.  No edema to lower extremities.        Assessment:    Hepatitis C. She is doing well. She has cleared the virus.    Plan:    OV in 6 weeks. CBC with diff and cmet in 4 weeks.

## 2013-09-06 ENCOUNTER — Encounter (INDEPENDENT_AMBULATORY_CARE_PROVIDER_SITE_OTHER): Payer: Self-pay | Admitting: *Deleted

## 2013-09-06 ENCOUNTER — Telehealth (INDEPENDENT_AMBULATORY_CARE_PROVIDER_SITE_OTHER): Payer: Self-pay | Admitting: *Deleted

## 2013-09-06 ENCOUNTER — Other Ambulatory Visit (INDEPENDENT_AMBULATORY_CARE_PROVIDER_SITE_OTHER): Payer: Self-pay | Admitting: *Deleted

## 2013-09-06 DIAGNOSIS — B182 Chronic viral hepatitis C: Secondary | ICD-10-CM

## 2013-09-06 NOTE — Telephone Encounter (Signed)
.  Per Delrae Rend, patient is to have lab work drawn in 4 weeks.

## 2013-09-09 ENCOUNTER — Inpatient Hospital Stay (HOSPITAL_COMMUNITY)
Admission: AD | Admit: 2013-09-09 | Discharge: 2013-09-13 | DRG: 385 | Disposition: A | Discharge status: Home / Self Care | Payer: Medicare HMO | Source: Other Acute Inpatient Hospital | Attending: Internal Medicine | Admitting: Internal Medicine

## 2013-09-09 ENCOUNTER — Telehealth (INDEPENDENT_AMBULATORY_CARE_PROVIDER_SITE_OTHER): Payer: Self-pay | Admitting: *Deleted

## 2013-09-09 ENCOUNTER — Encounter (HOSPITAL_COMMUNITY): Payer: Self-pay | Admitting: General Practice

## 2013-09-09 DIAGNOSIS — D61818 Other pancytopenia: Secondary | ICD-10-CM | POA: Diagnosis present

## 2013-09-09 DIAGNOSIS — Z933 Colostomy status: Secondary | ICD-10-CM | POA: Diagnosis not present

## 2013-09-09 DIAGNOSIS — K7460 Unspecified cirrhosis of liver: Secondary | ICD-10-CM | POA: Diagnosis present

## 2013-09-09 DIAGNOSIS — Z9089 Acquired absence of other organs: Secondary | ICD-10-CM

## 2013-09-09 DIAGNOSIS — T3995XA Adverse effect of unspecified nonopioid analgesic, antipyretic and antirheumatic, initial encounter: Secondary | ICD-10-CM | POA: Diagnosis present

## 2013-09-09 DIAGNOSIS — D696 Thrombocytopenia, unspecified: Secondary | ICD-10-CM | POA: Diagnosis present

## 2013-09-09 DIAGNOSIS — B1920 Unspecified viral hepatitis C without hepatic coma: Secondary | ICD-10-CM | POA: Diagnosis present

## 2013-09-09 DIAGNOSIS — K921 Melena: Secondary | ICD-10-CM | POA: Diagnosis present

## 2013-09-09 DIAGNOSIS — K922 Gastrointestinal hemorrhage, unspecified: Secondary | ICD-10-CM | POA: Diagnosis present

## 2013-09-09 DIAGNOSIS — N390 Urinary tract infection, site not specified: Secondary | ICD-10-CM | POA: Diagnosis present

## 2013-09-09 DIAGNOSIS — R768 Other specified abnormal immunological findings in serum: Secondary | ICD-10-CM

## 2013-09-09 DIAGNOSIS — I10 Essential (primary) hypertension: Secondary | ICD-10-CM | POA: Diagnosis present

## 2013-09-09 DIAGNOSIS — Z880 Allergy status to penicillin: Secondary | ICD-10-CM | POA: Diagnosis not present

## 2013-09-09 DIAGNOSIS — K7689 Other specified diseases of liver: Secondary | ICD-10-CM | POA: Diagnosis present

## 2013-09-09 DIAGNOSIS — K2991 Gastroduodenitis, unspecified, with bleeding: Secondary | ICD-10-CM | POA: Diagnosis present

## 2013-09-09 DIAGNOSIS — R748 Abnormal levels of other serum enzymes: Secondary | ICD-10-CM | POA: Diagnosis present

## 2013-09-09 DIAGNOSIS — K5090 Crohn's disease, unspecified, without complications: Principal | ICD-10-CM | POA: Diagnosis present

## 2013-09-09 DIAGNOSIS — G4700 Insomnia, unspecified: Secondary | ICD-10-CM | POA: Diagnosis present

## 2013-09-09 DIAGNOSIS — K2971 Gastritis, unspecified, with bleeding: Secondary | ICD-10-CM | POA: Diagnosis present

## 2013-09-09 DIAGNOSIS — D509 Iron deficiency anemia, unspecified: Secondary | ICD-10-CM

## 2013-09-09 DIAGNOSIS — D62 Acute posthemorrhagic anemia: Secondary | ICD-10-CM

## 2013-09-09 DIAGNOSIS — Z823 Family history of stroke: Secondary | ICD-10-CM

## 2013-09-09 DIAGNOSIS — R791 Abnormal coagulation profile: Secondary | ICD-10-CM | POA: Diagnosis present

## 2013-09-09 DIAGNOSIS — K319 Disease of stomach and duodenum, unspecified: Secondary | ICD-10-CM | POA: Diagnosis present

## 2013-09-09 DIAGNOSIS — D689 Coagulation defect, unspecified: Secondary | ICD-10-CM

## 2013-09-09 DIAGNOSIS — K50919 Crohn's disease, unspecified, with unspecified complications: Secondary | ICD-10-CM

## 2013-09-09 HISTORY — DX: Inflammatory liver disease, unspecified: K75.9

## 2013-09-09 LAB — HEMOGLOBIN AND HEMATOCRIT, BLOOD
HCT: 20.2 % — ABNORMAL LOW (ref 36.0–46.0)
Hemoglobin: 7 g/dL — ABNORMAL LOW (ref 12.0–15.0)

## 2013-09-09 MED ORDER — PANTOPRAZOLE SODIUM 40 MG IV SOLR
40.0000 mg | Freq: Once | INTRAVENOUS | Status: AC
  Administered 2013-09-09: 40 mg via INTRAVENOUS
  Filled 2013-09-09: qty 40

## 2013-09-09 MED ORDER — GABAPENTIN 100 MG PO CAPS
100.0000 mg | ORAL_CAPSULE | Freq: Three times a day (TID) | ORAL | Status: DC
  Administered 2013-09-09 – 2013-09-13 (×11): 100 mg via ORAL
  Filled 2013-09-09 (×11): qty 1

## 2013-09-09 MED ORDER — SODIUM CHLORIDE 0.9 % IV SOLN
INTRAVENOUS | Status: DC
Start: 2013-09-09 — End: 2013-09-13
  Administered 2013-09-09 – 2013-09-10 (×2): via INTRAVENOUS

## 2013-09-09 MED ORDER — PANTOPRAZOLE SODIUM 40 MG IV SOLR: 40.0000 mg | Freq: Two times a day (BID) | INTRAVENOUS | Status: DC

## 2013-09-09 MED ORDER — ONDANSETRON HCL 4 MG/2ML IJ SOLN: 4.0000 mg | Freq: Four times a day (QID) | INTRAMUSCULAR | Status: DC | PRN

## 2013-09-09 MED ORDER — SODIUM CHLORIDE 0.9 % IV SOLN
8.0000 mg/h | INTRAVENOUS | Status: DC
  Administered 2013-09-09 – 2013-09-10 (×2): 8 mg/h via INTRAVENOUS
  Filled 2013-09-09 (×7): qty 80

## 2013-09-09 MED ORDER — ONDANSETRON HCL 4 MG PO TABS: 4.0000 mg | ORAL_TABLET | Freq: Four times a day (QID) | ORAL | Status: DC | PRN

## 2013-09-09 MED ORDER — PANTOPRAZOLE SODIUM 40 MG IV SOLR
INTRAVENOUS | Status: AC
  Filled 2013-09-09: qty 80

## 2013-09-09 MED ORDER — ALPRAZOLAM 1 MG PO TABS
1.0000 mg | ORAL_TABLET | Freq: Every evening | ORAL | Status: DC | PRN
  Administered 2013-09-09 – 2013-09-12 (×4): 1 mg via ORAL
  Filled 2013-09-09: qty 1
  Filled 2013-09-09 (×3): qty 2

## 2013-09-09 NOTE — Telephone Encounter (Signed)
Per Dr.Rehman - Patient needs to have CBC. Depends on the results patient will need to have a colonoscopy. Order was sent to lab.Patient was made aware.

## 2013-09-09 NOTE — H&P (Addendum)
PCP:   [name], MD   Chief Complaint:  Bleeding from ileostomy  HPI:  58 year old female who  has a past medical history of Hypertension; Enteritis presumed infectious; Crohn's; Crohn's; Anemia; Hepatitis C antibody test positive; and Hepatitis. patient started having bright red blood from ileostomy on Saturday, patient emptied 3 bags, then it stopped. Patient felt weak both Saturday and Sunday, and called GI clinic Dr Minus Liberty, for further recommendations. After calling she started again having blood in the ileostomy bag and so patient got concerned, and called by the clinic. When she was told to come to hospital for further evaluation. Patient says that she noted black colored stool initially on Saturday, and then intermittently she had a bright red blood in the ileostomy bag. She denies fever, no new abdominal pain, she does have persistent abdominal discomfort due to history of Crohn's disease. She had nausea and dizziness on standing but no vomiting. She did not pass out. Denies dysuria, urgency or frequency of urination. Patient has history of Crohn's disease, and a total colectomy was performed in 2004, and now she has ileostomy bag in place. She denies taking NSAIDs or aspirin. She admits to having some shortness of breath on exertion but no chest pain.  Patient called EMS, who took her to [facility] ED, lab work done over there revealed hemoglobin of 9.0, hematocrit 26.6, INR 1.3 lipase 118, BUN 15, creatinine 1.31. CO2 20.0, potassium 3.9, sodium 132. AST 35 ALT 13 alkaline phosphatase 108 albumin 3.0. UA showed large bacteria 30-40 WBCs per high-power field positive leukocyte esterase negative nitrite.  Allergies:   Allergies  Allergen Reactions  . Penicillins Rash      Past Medical History  Diagnosis Date  . Hypertension   . Enteritis presumed infectious   . Crohn's   . Crohn's   . Anemia   . Hepatitis C antibody test positive   . Hepatitis     Past Surgical  History  Procedure Laterality Date  . Colon surgery      MULTIPLE SURGERIES FOR CROHNS  . Ileostomy      multiple abdominal surgeries for Crohn's by Dr. Gabriel Cirri  . Cholecystectomy    . Abscess drainage      abdominal  . Esophagogastroduodenoscopy  10/05/2010    Procedure: ESOPHAGOGASTRODUODENOSCOPY (EGD);  Surgeon: [name], MD;  Location: [facility];  Service: Endoscopy;  Laterality: N/A;  7:30 am  . Cirrhosis      Prior to Admission medications   Medication Sig Start Date End Date Taking? Authorizing Provider  acetaminophen (TYLENOL) 500 MG tablet Take 1,000 mg by mouth every 6 (six) hours as needed for mild pain.     Historical Provider, MD  ALPRAZolam Prudy Feeler) 1 MG tablet Take 1 tablet (1 mg total) by mouth at bedtime as needed for sleep. 08/19/13   Junie Spencer, FNP  diltiazem (CARDIZEM CD) 240 MG 24 hr capsule Take 1 capsule (240 mg total) by mouth daily. 07/04/13   Junie Spencer, FNP  gabapentin (NEURONTIN) 100 MG capsule Take 1 capsule (100 mg total) by mouth 3 (three) times daily. 08/27/13   Junie Spencer, FNP  Ledipasvir-Sofosbuvir (HARVONI PO) Take by mouth.    Historical Provider, MD  Multiple Vitamins-Minerals (MULTIVITAMIN WITH MINERALS) tablet Take 1 tablet by mouth daily.    Historical Provider, MD  omeprazole (PRILOSEC) 20 MG capsule Take 20 mg by mouth daily as needed (Acid Reflux).    Historical Provider, MD  Probiotic Product (PROBIOTIC  DAILY PO) Take 1 capsule by mouth daily.    Historical Provider, MD  traMADol (ULTRAM) 50 MG tablet Take 1 tablet (50 mg total) by mouth 2 (two) times daily as needed for moderate pain. 08/13/13   Junie Spencerhristy A Hawks, FNP    Social History:  reports that she has never smoked. She has never used smokeless tobacco. She reports that she does not drink alcohol or use illicit drugs.  Family History  Problem Relation Age of Onset  . Cancer Mother   . Stroke Father      All the positives are listed in BOLD  Review of Systems:    HEENT: Headache, blurred vision, runny nose, sore throat Neck: Hypothyroidism, hyperthyroidism,,lymphadenopathy Chest : Shortness of breath, history of COPD, Asthma Heart : Chest pain, history of coronary arterey disease GI:  Nausea, vomiting, diarrhea, constipation, GERD GU: Dysuria, urgency, frequency of urination, hematuria Neuro: Stroke, seizures, syncope Psych: Depression, anxiety, hallucinations   Physical Exam: Temperature 98.5 F (36.9 C), temperature source Oral, height 5' 5.5" (1.664 m), weight 102.4 kg (225 lb 12 oz). Constitutional:   Patient is a well-developed and well-nourished female* in no acute distress and cooperative with exam. Head: Normocephalic and atraumatic Mouth: Mucus membranes moist Eyes: PERRL, EOMI, conjunctivae normal Neck: Supple, No Thyromegaly Cardiovascular: RRR, S1 normal, S2 normal Pulmonary/Chest: CTAB, no wheezes, rales, or rhonchi Abdominal: Soft. Non-tender, non-distended, bowel sounds are normal, ileostomy in place, bag has black colored liquid stool, no masses, organomegaly, or guarding present.  Neurological: A&O x3, Strenght is normal and symmetric bilaterally, cranial nerve II-XII are grossly intact, no focal motor deficit, sensory intact to light touch bilaterally.  Extremities : No Cyanosis, Clubbing or Edema  Labs as in the history of present illness     Assessment/Plan Active Problems:   Crohn's disease   Anemia   Cirrhosis of liver without mention of alcohol   Hepatitis C antibody test positive   Insomnia   GI bleed  GI bleed/melena ? Cause Patient has had black colored stool with intermittent bright red blood per rectum. Will admit the patient, start Protonix infusion. Patient received one dose of Protonix and [facility], will give bolus Protonix 40 mg IV and then infusion 8 mg per hour. GI consultation will be obtained in a.m. for possible EGD. We'll keep the patient n.p.o. after midnight  ? UTI Will obtain UA and  [facility] shows 30-40 WBCs per high-power field in the urine, will start empiric Levaquin, as patient has allergy to penicillin. Follow results of urine culture  Anemia Patient's hemoglobin today was 9.0, will check H&H every 6 hours and transfuse PRBC if hemoglobin is less than 7.  Crohn's disease Patient has long-standing history of chronic disease, she is currently not taking any medications.  History of hepatitis C Patient is followed by GI as outpatient  History of hypertension Currently blood pressure and low 100s, will hold Cardizem.  Elevated lipase Patient has mild elevation of lipase 118 Will repeat lipase in the a.m. Will also check abdominal ultrasound in a.m. to rule out acute pancreatitis  Insomnia Continue Xanax when necessary  DVT prophylaxis SCDs  Code status: Full code  Family discussion: Admission, patients condition and plan of care including tests being ordered have been discussed with the patient and *her son at bedside* who indicate understanding and agree with the plan and Code Status.   Time Spent on Admission: 60 minutes  Lavel Rieman S [facility] Pager: [phone] 09/09/2013, 10:30 PM  If 7PM-7AM, please contact night-coverage  [email or web address]  Password [email or web address]

## 2013-09-09 NOTE — Telephone Encounter (Signed)
Kathryn [name] said she has an ileostomy and yesterday there was 3 dark clots mixed with red blood in 3 bags total.  After that it cleared up. Would like to know what she should do. It has left her feeling weak. The return phone number is [phone].

## 2013-09-10 ENCOUNTER — Inpatient Hospital Stay (HOSPITAL_COMMUNITY): Payer: Medicare HMO

## 2013-09-10 ENCOUNTER — Encounter (INDEPENDENT_AMBULATORY_CARE_PROVIDER_SITE_OTHER): Payer: Self-pay | Admitting: Internal Medicine

## 2013-09-10 DIAGNOSIS — K7460 Unspecified cirrhosis of liver: Secondary | ICD-10-CM

## 2013-09-10 DIAGNOSIS — K766 Portal hypertension: Secondary | ICD-10-CM

## 2013-09-10 DIAGNOSIS — K922 Gastrointestinal hemorrhage, unspecified: Secondary | ICD-10-CM

## 2013-09-10 DIAGNOSIS — D62 Acute posthemorrhagic anemia: Secondary | ICD-10-CM

## 2013-09-10 DIAGNOSIS — D649 Anemia, unspecified: Secondary | ICD-10-CM

## 2013-09-10 DIAGNOSIS — D696 Thrombocytopenia, unspecified: Secondary | ICD-10-CM | POA: Diagnosis present

## 2013-09-10 DIAGNOSIS — D689 Coagulation defect, unspecified: Secondary | ICD-10-CM | POA: Diagnosis present

## 2013-09-10 DIAGNOSIS — N390 Urinary tract infection, site not specified: Secondary | ICD-10-CM | POA: Diagnosis present

## 2013-09-10 DIAGNOSIS — Z8719 Personal history of other diseases of the digestive system: Secondary | ICD-10-CM

## 2013-09-10 LAB — MRSA PCR SCREENING: MRSA by PCR: POSITIVE — AB

## 2013-09-10 LAB — ABO/RH: ABO/RH(D): O NEG

## 2013-09-10 LAB — LIPASE, BLOOD: LIPASE: 149 U/L — AB (ref 11–59)

## 2013-09-10 LAB — COMPREHENSIVE METABOLIC PANEL
ALK PHOS: 84 U/L (ref 39–117)
ALT: 10 U/L (ref 0–35)
AST: 18 U/L (ref 0–37)
Albumin: 2.6 g/dL — ABNORMAL LOW (ref 3.5–5.2)
Anion gap: 10 (ref 5–15)
BUN: 14 mg/dL (ref 6–23)
CALCIUM: 8.2 mg/dL — AB (ref 8.4–10.5)
CO2: 20 meq/L (ref 19–32)
Chloride: 105 mEq/L (ref 96–112)
Creatinine, Ser: 1.19 mg/dL — ABNORMAL HIGH (ref 0.50–1.10)
GFR calc Af Amer: 58 mL/min — ABNORMAL LOW (ref >90)
GFR, EST NON AFRICAN AMERICAN: 50 mL/min — AB (ref >90)
GLUCOSE: 123 mg/dL — AB (ref 70–99)
Potassium: 4.5 mEq/L (ref 3.7–5.3)
SODIUM: 135 meq/L — AB (ref 137–147)
Total Bilirubin: 0.5 mg/dL (ref 0.3–1.2)
Total Protein: 5.3 g/dL — ABNORMAL LOW (ref 6.0–8.3)

## 2013-09-10 LAB — PROTIME-INR
INR: 1.5 — ABNORMAL HIGH (ref 0.00–1.49)
PROTHROMBIN TIME: 18.1 s — AB (ref 11.6–15.2)

## 2013-09-10 LAB — PREPARE RBC (CROSSMATCH)

## 2013-09-10 LAB — HEMOGLOBIN AND HEMATOCRIT, BLOOD
HEMATOCRIT: 19.1 % — AB (ref 36.0–46.0)
HEMATOCRIT: 22.6 % — AB (ref 36.0–46.0)
Hemoglobin: 6.4 g/dL — CL (ref 12.0–15.0)
Hemoglobin: 7.8 g/dL — ABNORMAL LOW (ref 12.0–15.0)

## 2013-09-10 MED ORDER — LEVOFLOXACIN IN D5W 750 MG/150ML IV SOLN
750.0000 mg | INTRAVENOUS | Status: DC
  Administered 2013-09-10 – 2013-09-12 (×3): 750 mg via INTRAVENOUS
  Filled 2013-09-10 (×3): qty 150

## 2013-09-10 MED ORDER — SODIUM CHLORIDE 0.9 % IV SOLN
Freq: Once | INTRAVENOUS | Status: AC
  Administered 2013-09-10: 21:00:00 via INTRAVENOUS

## 2013-09-10 MED ORDER — PEG 3350-KCL-NABCB-NACL-NASULF 236 G PO SOLR
4000.0000 mL | Freq: Once | ORAL | Status: AC
  Administered 2013-09-11: 4000 mL via ORAL
  Filled 2013-09-10: qty 4000

## 2013-09-10 MED ORDER — SODIUM CHLORIDE 0.9 % IV SOLN: Freq: Once | INTRAVENOUS | Status: AC

## 2013-09-10 MED ORDER — DIPHENHYDRAMINE HCL 25 MG PO CAPS
25.0000 mg | ORAL_CAPSULE | Freq: Once | ORAL | Status: AC
  Administered 2013-09-10: 25 mg via ORAL
  Filled 2013-09-10: qty 1

## 2013-09-10 MED ORDER — VITAMIN K1 10 MG/ML IJ SOLN
10.0000 mg | Freq: Once | INTRAVENOUS | Status: AC
  Administered 2013-09-10: 10 mg via INTRAVENOUS
  Filled 2013-09-10: qty 1

## 2013-09-10 MED ORDER — FUROSEMIDE 10 MG/ML IJ SOLN
20.0000 mg | Freq: Once | INTRAMUSCULAR | Status: AC
  Administered 2013-09-11: 20 mg via INTRAVENOUS
  Filled 2013-09-10: qty 2

## 2013-09-10 MED ORDER — PEG 3350-KCL-NA BICARB-NACL 420 G PO SOLR
ORAL | Status: AC
  Filled 2013-09-10: qty 4000

## 2013-09-10 MED ORDER — CHLORHEXIDINE GLUCONATE CLOTH 2 % EX PADS
6.0000 | MEDICATED_PAD | Freq: Every day | CUTANEOUS | Status: DC
  Administered 2013-09-10 – 2013-09-13 (×4): 6 via TOPICAL

## 2013-09-10 MED ORDER — MUPIROCIN 2 % EX OINT
1.0000 "application " | TOPICAL_OINTMENT | Freq: Two times a day (BID) | CUTANEOUS | Status: DC
  Administered 2013-09-10 – 2013-09-13 (×8): 1 via NASAL
  Filled 2013-09-10 (×4): qty 22

## 2013-09-10 MED ORDER — ACETAMINOPHEN 325 MG PO TABS
650.0000 mg | ORAL_TABLET | Freq: Once | ORAL | Status: AC
  Administered 2013-09-10: 650 mg via ORAL
  Filled 2013-09-10: qty 2

## 2013-09-10 NOTE — Progress Notes (Signed)
INITIAL NUTRITION ASSESSMENT  DOCUMENTATION CODES Per approved criteria  -Obesity Unspecified   INTERVENTION: Resource Breeze po TID, each supplement provides 250 kcal and 9 grams of protein   Recommend advance as tolerated to Soft/Gluten-free  NUTRITION DIAGNOSIS: Inadequate oral intake related to GI bleed, limited available nutrients as evidenced by clear liquid diet  Goal: Pt to meet >/= 90% of their estimated nutrition needs    Monitor: Diet advancement and tolerance    Reason for Assessment: Malnutrition Screen Score =  3  58 y.o. female   ASSESSMENT: Pt has GI bleed. Hx of Chron's disease. Weight loss of 3% noted which is not significant may be associated with blood loss. Her home diet regular. Will follow diet advancement and evaluate adequacy. No overt signs of malnutrition.  Height: Ht Readings from Last 1 Encounters:  09/09/13 5' 5.5" (1.664 m)    Weight: Wt Readings from Last 1 Encounters:  09/09/13 225 lb 12 oz (102.4 kg)    Ideal Body Weight: 130# (59 kg)  % Ideal Body Weight: 174%  Wt Readings from Last 10 Encounters:  09/09/13 225 lb 12 oz (102.4 kg)  09/05/13 233 lb 11.2 oz (106.006 kg)  08/07/13 231 lb 6.4 oz (104.962 kg)  07/04/13 233 lb 6.4 oz (105.87 kg)  06/13/13 235 lb 11.2 oz (106.913 kg)  06/09/13 234 lb (106.142 kg)  05/28/13 235 lb 6.4 oz (106.777 kg)  05/14/13 242 lb 9.6 oz (110.043 kg)  01/14/13 239 lb 4.8 oz (108.546 kg)  12/20/12 236 lb 12.8 oz (107.412 kg)    Usual Body Weight: 230-240#  % Usual Body Weight: 97%  BMI:  Body mass index is 36.98 kg/(m^2).obesity class II  Estimated Nutritional Needs: Kcal: 1500-1770 Protein: 80-90 gr Fluid: >1500 ml daily  Skin: intact. She has ostomy pouch RLQ.  Diet Order: Clear Liquid  EDUCATION NEEDS: -No education needs identified at this time   Intake/Output Summary (Last 24 hours) at 09/10/13 1528 Last data filed at 09/10/13 1515  Gross per 24 hour  Intake    575 ml   Output    300 ml  Net    275 ml    Last BM:  09/10/13   Labs:   Recent Labs Lab 09/10/13 0430  NA 135*  K 4.5  CL 105  CO2 20  BUN 14  CREATININE 1.19*  CALCIUM 8.2*  GLUCOSE 123*    CBG (last 3)  No results found for this basename: GLUCAP,  in the last 72 hours  Scheduled Meds: . Chlorhexidine Gluconate Cloth  6 each Topical Q0600  . gabapentin  100 mg Oral TID  . levofloxacin (LEVAQUIN) IV  750 mg Intravenous Q24H  . mupirocin ointment  1 application Nasal BID  . [START ON 09/13/2013] pantoprazole (PROTONIX) IV  40 mg Intravenous Q12H    Continuous Infusions: . sodium chloride 75 mL/hr at 09/10/13 0700  . pantoprozole (PROTONIX) infusion 8 mg/hr (09/10/13 1147)    Past Medical History  Diagnosis Date  . Hypertension   . Enteritis presumed infectious   . Crohn's   . Crohn's   . Anemia   . Hepatitis C antibody test positive   . Hepatitis     Past Surgical History  Procedure Laterality Date  . Colon surgery      MULTIPLE SURGERIES FOR CROHNS  . Ileostomy      multiple abdominal surgeries for Crohn's by Dr. Gabriel CirrieMason  . Cholecystectomy    . Abscess drainage  abdominal  . Esophagogastroduodenoscopy  10/05/2010    Procedure: ESOPHAGOGASTRODUODENOSCOPY (EGD);  Surgeon: [name], MD;  Location: [facility];  Service: Endoscopy;  Laterality: N/A;  7:30 am  . Cirrhosis      Royann Shivers MS,RD,CSG,LDN Office: [phone] Pager: [phone]

## 2013-09-10 NOTE — Consult Note (Signed)
Reason for Consult: GI bleed Referring Physician: Hospitalist  Kathryn [name] is an 58 y.o. female.  HPI: Admitted thru the ED yesterday with GI bleed. Seen at [facility] for GI bleed. Requested to be transferred to [facility].  She apparently noticed clots in her ileostomy. She had noticed the bleeding Sunday morning.  She emptied her bag x 3 Sunday. She says the bag was "full of blood".On admission noted her hemoglobin was 7.0. Hemoglobin was 6.3 early this am.  She has received one unit of blood today.  She hs emptied her bag x 1 today. She says there has been no blood.   She says the stool in her bag was black. She has maroon colored blood on her panties.   No NSAIDs. Hx of Crohn's. Underwent a subtotal colectomy ileostomy in 2004. Diagnosed with Crohn's in 1985.  Patient recently seen by me in the office for Hepatitis C. Treated with Harvoni. Started Harvoni 07/09/2013. Recent CBC by me 11.3  Two weeks ago.   10/05/2010 EGD: Indications: History of melena and anemia; she also has a intermittent right upper quadrant abdominal pain and history of Crohn's disease.   Impression:  Multiple gastric antral erosions. Biopsy taken for histology.  Differential diagnosis includes H. pylori gastritis NSAID gastropathy vs gastric Crohn's disease.            Past Medical History  Diagnosis Date  . Hypertension   . Enteritis presumed infectious   . Crohn's   . Crohn's   . Anemia   . Hepatitis C antibody test positive   . Hepatitis     Past Surgical History  Procedure Laterality Date  . Colon surgery      MULTIPLE SURGERIES FOR CROHNS  . Ileostomy      multiple abdominal surgeries for Crohn's by Dr. Anthony Sar  . Cholecystectomy    . Abscess drainage      abdominal  . Esophagogastroduodenoscopy  10/05/2010    Procedure: ESOPHAGOGASTRODUODENOSCOPY (EGD);  Surgeon: Rogene Houston, MD;  Location: [facility];  Service: Endoscopy;  Laterality: N/A;  7:30 am  . Cirrhosis      Family History   Problem Relation Age of Onset  . Cancer Mother   . Stroke Father     Social History:  reports that she has never smoked. She has never used smokeless tobacco. She reports that she does not drink alcohol or use illicit drugs.  Allergies:  Allergies  Allergen Reactions  . Penicillins Rash    Medications: I have reviewed the patient's current medications.  Results for orders placed during the hospital encounter of 09/09/13 (from the past 48 hour(s))  MRSA PCR SCREENING     Status: Abnormal   Collection Time    09/09/13 10:00 PM      Result Value Ref Range   MRSA by PCR POSITIVE (*) NEGATIVE   Comment:            The GeneXpert MRSA Assay (FDA     approved for NASAL specimens     only), is one component of a     comprehensive MRSA colonization     surveillance program. It is not     intended to diagnose MRSA     infection nor to guide or     monitor treatment for     MRSA infections.     RESULT CALLED TO, READ BACK BY AND VERIFIED WITH:     WAGONER R AT [place] [id number] BY FORSYTH K  HEMOGLOBIN AND HEMATOCRIT, BLOOD     Status: Abnormal   Collection Time    09/09/13 11:01 PM      Result Value Ref Range   Hemoglobin 7.0 (*) 12.0 - 15.0 g/dL   HCT 20.2 (*) 36.0 - 46.0 %  COMPREHENSIVE METABOLIC PANEL     Status: Abnormal   Collection Time    09/10/13  4:30 AM      Result Value Ref Range   Sodium 135 (*) 137 - 147 mEq/L   Potassium 4.5  3.7 - 5.3 mEq/L   Chloride 105  96 - 112 mEq/L   CO2 20  19 - 32 mEq/L   Glucose, Bld 123 (*) 70 - 99 mg/dL   BUN 14  6 - 23 mg/dL   Creatinine, Ser 1.19 (*) 0.50 - 1.10 mg/dL   Calcium 8.2 (*) 8.4 - 10.5 mg/dL   Total Protein 5.3 (*) 6.0 - 8.3 g/dL   Albumin 2.6 (*) 3.5 - 5.2 g/dL   AST 18  0 - 37 U/L   ALT 10  0 - 35 U/L   Alkaline Phosphatase 84  39 - 117 U/L   Total Bilirubin 0.5  0.3 - 1.2 mg/dL   GFR calc non Af Amer 50 (*) >90 mL/min   GFR calc Af Amer 58 (*) >90 mL/min   Comment: (NOTE)     The eGFR has been calculated using  the CKD EPI equation.     This calculation has not been validated in all clinical situations.     eGFR's persistently <90 mL/min signify possible Chronic Kidney     Disease.   Anion gap 10  5 - 15  PROTIME-INR     Status: Abnormal   Collection Time    09/10/13  4:30 AM      Result Value Ref Range   Prothrombin Time 18.1 (*) 11.6 - 15.2 seconds   INR 1.50 (*) 0.00 - 1.49  HEMOGLOBIN AND HEMATOCRIT, BLOOD     Status: Abnormal   Collection Time    09/10/13  4:30 AM      Result Value Ref Range   Hemoglobin 6.4 (*) 12.0 - 15.0 g/dL   Comment: RESULT REPEATED AND VERIFIED     CRITICAL RESULT CALLED TO, READ BACK BY AND VERIFIED WITH:     DOROTHY BREWER RN ON [id number] AT 0610 BY RESSEGGER R   HCT 19.1 (*) 36.0 - 46.0 %  LIPASE, BLOOD     Status: Abnormal   Collection Time    09/10/13  4:30 AM      Result Value Ref Range   Lipase 149 (*) 11 - 59 U/L  TYPE AND SCREEN     Status: None   Collection Time    09/10/13  6:47 AM      Result Value Ref Range   ABO/RH(D) O NEG     Antibody Screen NEG     Sample Expiration 09/13/2013     Unit Number [id number]     Blood Component Type RBC LR PHER2     Unit division 00     Status of Unit ALLOCATED     Transfusion Status OK TO TRANSFUSE     Crossmatch Result Compatible     Unit Number [id number]     Blood Component Type RBC LR PHER2     Unit division 00     Status of Unit ISSUED     Transfusion Status OK TO TRANSFUSE     Crossmatch Result  Compatible    ABO/RH     Status: None   Collection Time    09/10/13  6:48 AM      Result Value Ref Range   ABO/RH(D) O NEG    PREPARE RBC (CROSSMATCH)     Status: None   Collection Time    09/10/13  6:48 AM      Result Value Ref Range   Order Confirmation ORDER PROCESSED BY BLOOD BANK      US Abdomen Complete  09/10/2013   CLINICAL DATA:  Elevated lipase.  GI bleeding.  Hepatitis C.  EXAM: ULTRASOUND ABDOMEN COMPLETE  COMPARISON:  06/20/2013  FINDINGS: Gallbladder:  Removed.  Common bile duct:   Diameter: 4.4 mm, normal.  Liver:  The liver has a coarse echotexture and has slight nodularity of the contour. No focal lesions.  IVC:  No abnormality visualized.  Pancreas:  Visualized portion unremarkable.  Spleen:  Splenomegaly.  14.5 cm in length.  Right Kidney:  Length: 11.5 cm. Echogenicity within normal limits. No mass or hydronephrosis visualized.  Left Kidney:  Length: 10.9 cm. Echogenicity within normal limits. No mass or hydronephrosis visualized.  Abdominal aorta:  2.4 cm maximum diameter.  Other findings:  None.  IMPRESSION: No acute abnormality. Chronic splenomegaly. Chronic nodularity and coarse echogenicity of the liver parenchyma consistent with the history of chronic liver disease.   Electronically Signed   By: Rozetta Nunnery M.D.   On: 09/10/2013 11:23    ROS Blood pressure 132/57, pulse 88, temperature 98.5 F (36.9 C), temperature source Oral, resp. rate 13, height 5' 5.5" (1.664 m), weight 225 lb 12 oz (102.4 kg), SpO2 100.00%. Physical Exam Alert and oriented. Skin warm and dry. Oral mucosa is moist.   . Sclera anicteric, conjunctivae is pink. Thyroid not enlarged. No cervical lymphadenopathy. Lungs clear. Heart regular rate and rhythm.  Abdomen is soft. Bowel sounds are positive. No hepatomegaly. No abdominal masses felt. No tenderness.  No edema to lower extremities.   Assessment/Plan: Melana and anemia. There also was also some maroon colored blood on her panties today.  Her last colonoscopy was in 2002 by Dr. Rowe Pavy. (See scanned report under procedures). Will discuss with Dr. Laural Golden.   SETZER,TERRI W 09/10/2013, 12:24 PM     GI attending note; Patient interviewed and examined. Lab studies reviewed. Acute GI bleed with profound anemia. Bleed secondary to Crohn's disease. Doubt variceal bleed. She has remote history of peptic ulcer disease. 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 She is into his sixth week of therapy with Harvoni which needs to be continued as she was HCV  negative after 4 weeks of therapy. If bleeding recurs will begin octreotide infusion. EGD and colonoscopy on 09/11/2013.

## 2013-09-10 NOTE — Progress Notes (Signed)
[facility] PROGRESS NOTE  Kathryn [name] SFK:[id number] DOB: 1956-01-31 DOA: 09/09/2013 PCP: [name], MD  Assessment/Plan: 1. GI bleeding. Patient reports that she's noticed fresh blood and dark clots in her colostomy bag since 8/2. She felt increasingly lightheaded and weak. Her hemoglobin has dropped significantly. She has a history of Crohn's disease, but denies any abdominal pain, vomiting or any other symptoms. She is being transfused 2 units of PRBCs. Gastroenterology has been consulted. She is on a Protonix infusion. She is currently n.p.o. until seen by GI. 2. Acute blood loss anemia. Baseline hemoglobin appeared to be around 11. She presented with a hemoglobin of 9 at [facility] ER. With intravenous hydration and bleeding, her hemoglobin has dropped down to 6.4. She is being transfused 2 units of PRBCs. 3. Coagulopathy. INR is elevated at 1.5. Possibly related to liver disease. Continue to follow. Given one dose of vitamin K 4. History of hepatitis C. Patient is on Harvoni per GI. 5. History of hypertension. Pleasant is currently on hold due to borderline blood pressures and ongoing GI bleeding. Restart once blood pressures have stabilized 6. History of Crohn's disease status post colostomy. She is does not appear to have any maintenance medications. Would defer to GI. 7. Possible UTI. Urinalysis from our hospital indicates 30-40 WBCs. She's been started empirically on Levaquin. Followup urine cultures 8. Elevated lipase. Unclear significance this patient does not have any symptoms of pancreatitis. Continue to follow clinically. 9. Thrombocytopenia. Likely related to liver disease. Continue to follow  Code Status: full code Family Communication: discussed with patient Disposition Plan: discharge home once improved    Consultants:  GI, Dr. [name]  Procedures:    Antibiotics:    HPI/Subjective: Still feels weak and light headed.  Described having fresh blood in her  stools with large dark clots.  Denies abdominal pain and vomiting  Objective: Filed Vitals:   09/10/13 0945  BP: 119/70  Pulse: 90  Temp: 98.5 F (36.9 C)  Resp: 18    Intake/Output Summary (Last 24 hours) at 09/10/13 1013 Last data filed at 09/10/13 0930  Gross per 24 hour  Intake    325 ml  Output    300 ml  Net     25 ml   Filed Weights   09/09/13 2113  Weight: 102.4 kg (225 lb 12 oz)    Exam:   General:  NAD  Cardiovascular: S1, S2 RRR  Respiratory: CTA B  Abdomen: soft, nt, nd, bs+, colostomy bag with brown liquid stool  Musculoskeletal: no edema b/l   Data Reviewed: Basic Metabolic Panel:  Recent Labs Lab 09/10/13 0430  NA 135*  K 4.5  CL 105  CO2 20  GLUCOSE 123*  BUN 14  CREATININE 1.19*  CALCIUM 8.2*   Liver Function Tests:  Recent Labs Lab 09/10/13 0430  AST 18  ALT 10  ALKPHOS 84  BILITOT 0.5  PROT 5.3*  ALBUMIN 2.6*    Recent Labs Lab 09/10/13 0430  LIPASE 149*   No results found for this basename: AMMONIA,  in the last 168 hours CBC:  Recent Labs Lab 09/09/13 2301 09/10/13 0430  HGB 7.0* 6.4*  HCT 20.2* 19.1*   Cardiac Enzymes: No results found for this basename: CKTOTAL, CKMB, CKMBINDEX, TROPONINI,  in the last 168 hours BNP (last 3 results) No results found for this basename: PROBNP,  in the last 8760 hours CBG: No results found for this basename: GLUCAP,  in the last 168 hours  Recent Results (from  the past 240 hour(s))  MRSA PCR SCREENING     Status: Abnormal   Collection Time    09/09/13 10:00 PM      Result Value Ref Range Status   MRSA by PCR POSITIVE (*) NEGATIVE Final   Comment:            The GeneXpert MRSA Assay (FDA     approved for NASAL specimens     only), is one component of a     comprehensive MRSA colonization     surveillance program. It is not     intended to diagnose MRSA     infection nor to guide or     monitor treatment for     MRSA infections.     RESULT CALLED TO, READ BACK BY  AND VERIFIED WITH:     WAGONER R AT 0120 ON [id number] BY FORSYTH K     Studies: No results found.  Scheduled Meds: . Chlorhexidine Gluconate Cloth  6 each Topical Q0600  . gabapentin  100 mg Oral TID  . mupirocin ointment  1 application Nasal BID  . [START ON 09/13/2013] pantoprazole (PROTONIX) IV  40 mg Intravenous Q12H   Continuous Infusions: . sodium chloride 75 mL/hr at 09/10/13 0700  . pantoprozole (PROTONIX) infusion 8 mg/hr (09/10/13 0700)    Active Problems:   Crohn's disease   Anemia   Cirrhosis of liver without mention of alcohol   Hepatitis C antibody test positive   Insomnia   GI bleed    Time spent: 25mins    [facility]ampaMEMON,JEHANZEB  [facility] Pager [phone]. If 7PM-7AM, please contact night-coverage at [email or web address], password [email or web address] 09/10/2013, 10:13 AM  LOS: 1 day

## 2013-09-10 NOTE — Progress Notes (Signed)
ANTIBIOTIC CONSULT NOTE - INITIAL  Pharmacy Consult for Levaquin Indication: UTI  Allergies  Allergen Reactions  . Penicillins Rash   Patient Measurements: Height: 5' 5.5" (166.4 cm) Weight: 225 lb 12 oz (102.4 kg) IBW/kg (Calculated) : 58.15  Vital Signs: Temp: 98.5 F (36.9 C) (08/04 1115) Temp src: Oral (08/04 1115) BP: 132/57 mmHg (08/04 1115) Pulse Rate: 88 (08/04 1115) Intake/Output from previous day: 08/03 0701 - 08/04 0700 In: 75 [I.V.:75] Out: -  Intake/Output from this shift: Total I/O In: 250 [I.V.:250] Out: 300 [Urine:300]  Labs:  Recent Labs  09/09/13 2301 09/10/13 0430  HGB 7.0* 6.4*  CREATININE  --  1.19*   Estimated Creatinine Clearance: 62.5 ml/min (by C-G formula based on Cr of 1.19). No results found for this basename: VANCOTROUGH, Leodis BinetVANCOPEAK, VANCORANDOM, GENTTROUGH, GENTPEAK, GENTRANDOM, TOBRATROUGH, TOBRAPEAK, TOBRARND, AMIKACINPEAK, AMIKACINTROU, AMIKACIN,  in the last 72 hours   Microbiology: Recent Results (from the past 720 hour(s))  MRSA PCR SCREENING     Status: Abnormal   Collection Time    09/09/13 10:00 PM      Result Value Ref Range Status   MRSA by PCR POSITIVE (*) NEGATIVE Final   Comment:            The GeneXpert MRSA Assay (FDA     approved for NASAL specimens     only), is one component of a     comprehensive MRSA colonization     surveillance program. It is not     intended to diagnose MRSA     infection nor to guide or     monitor treatment for     MRSA infections.     RESULT CALLED TO, READ BACK BY AND VERIFIED WITH:     WAGONER R AT 0120 ON [id number] BY FORSYTH K   Medical History: Past Medical History  Diagnosis Date  . Hypertension   . Enteritis presumed infectious   . Crohn's   . Crohn's   . Anemia   . Hepatitis C antibody test positive   . Hepatitis    Levaquin 8/4 >>  Assessment: 58yo female admitted with GIB.  Asked to initiate Levaquin empirically for suspected UTI.  Estimated Creatinine Clearance:  62.5 ml/min (by C-G formula based on Cr of 1.19).  Goal of Therapy:  Eradicate infection.  Plan:  Levaquin 750mg  IV q24hrs Switch to PO when improved / appropriate Monitor labs, renal fxn, and cultures  Valrie HartHall, Sage Hammill A 09/10/2013,12:22 PM

## 2013-09-10 NOTE — Progress Notes (Signed)
UR review complete.  

## 2013-09-10 NOTE — Care Management Note (Addendum)
    Page 1 of 1   09/13/2013     1:33:37 PM CARE MANAGEMENT NOTE 09/13/2013  Patient:  Andris BaumannWILSON,Kathryn L   Account Number:  [id number]  Date Initiated:  09/10/2013  Documentation initiated by:  Sharrie RothmanBLACKWELL,Akshar Starnes C  Subjective/Objective Assessment:   Pt admitted from home with gi bleed. Pt lives with her husband and will return home at discharge. Pt is independent with ADL's. Pt has colostomy that she cares for herself     Action/Plan:   No CM needs noted.   Anticipated DC Date:  09/12/2013   Anticipated DC Plan:  HOME/SELF CARE      DC Planning Services  CM consult      Choice offered to / List presented to:             Status of service:  Completed, signed off Medicare Important Message given?  YES (If response is "NO", the following Medicare IM given date fields will be blank) Date Medicare IM given:  09/13/2013 Medicare IM given by:  Sharrie RothmanBLACKWELL,Trease [place] C Date Additional Medicare IM given:   Additional Medicare IM given by:    Discharge Disposition:  HOME/SELF CARE  Per UR Regulation:    If discussed at Long Length of Stay Meetings, dates discussed:    Comments:  09/13/13 1330 Kathryn Queenammy Toivo Bordon, RN BSN Cm Pt discharged home today. No CM needs noted.  09/10/13 1510 Kathryn Queenammy Trenace Coughlin, RN BSN CM

## 2013-09-10 NOTE — Telephone Encounter (Signed)
Patient has been admitted to [facility].  

## 2013-09-11 ENCOUNTER — Encounter (HOSPITAL_COMMUNITY): Payer: Self-pay

## 2013-09-11 ENCOUNTER — Encounter (HOSPITAL_COMMUNITY)
Admission: AD | Disposition: A | Discharge status: Home / Self Care | Payer: Self-pay | Source: Other Acute Inpatient Hospital | Attending: Internal Medicine

## 2013-09-11 DIAGNOSIS — K922 Gastrointestinal hemorrhage, unspecified: Secondary | ICD-10-CM

## 2013-09-11 DIAGNOSIS — K766 Portal hypertension: Secondary | ICD-10-CM

## 2013-09-11 DIAGNOSIS — D689 Coagulation defect, unspecified: Secondary | ICD-10-CM

## 2013-09-11 DIAGNOSIS — D62 Acute posthemorrhagic anemia: Secondary | ICD-10-CM

## 2013-09-11 HISTORY — PX: ESOPHAGOGASTRODUODENOSCOPY: SHX5428

## 2013-09-11 HISTORY — PX: ILEOSCOPY: SHX5434

## 2013-09-11 LAB — BASIC METABOLIC PANEL
ANION GAP: 11 (ref 5–15)
BUN: 7 mg/dL (ref 6–23)
CO2: 22 meq/L (ref 19–32)
Calcium: 8.1 mg/dL — ABNORMAL LOW (ref 8.4–10.5)
Chloride: 104 mEq/L (ref 96–112)
Creatinine, Ser: 1.03 mg/dL (ref 0.50–1.10)
GFR calc Af Amer: 69 mL/min — ABNORMAL LOW (ref >90)
GFR calc non Af Amer: 59 mL/min — ABNORMAL LOW (ref >90)
Glucose, Bld: 138 mg/dL — ABNORMAL HIGH (ref 70–99)
Potassium: 3.7 mEq/L (ref 3.7–5.3)
Sodium: 137 mEq/L (ref 137–147)

## 2013-09-11 LAB — CBC
HEMATOCRIT: 26.1 % — AB (ref 36.0–46.0)
HEMOGLOBIN: 9 g/dL — AB (ref 12.0–15.0)
MCH: 29.3 pg (ref 26.0–34.0)
MCHC: 34.5 g/dL (ref 30.0–36.0)
MCV: 85 fL (ref 78.0–100.0)
Platelets: 51 10*3/uL — ABNORMAL LOW (ref 150–400)
RBC: 3.07 MIL/uL — AB (ref 3.87–5.11)
RDW: 16 % — ABNORMAL HIGH (ref 11.5–15.5)
WBC: 3.8 10*3/uL — ABNORMAL LOW (ref 4.0–10.5)

## 2013-09-11 LAB — URINE CULTURE: Colony Count: 25000

## 2013-09-11 LAB — TYPE AND SCREEN
ABO/RH(D): O NEG
Antibody Screen: NEGATIVE
UNIT DIVISION: 0
Unit division: 0
Unit division: 0

## 2013-09-11 LAB — COMPREHENSIVE METABOLIC PANEL
ALT: 11 U/L (ref 0–35)
AST: 19 U/L (ref 0–37)
Albumin: 2.5 g/dL — ABNORMAL LOW (ref 3.5–5.2)
Alkaline Phosphatase: 68 U/L (ref 39–117)
Anion gap: 9 (ref 5–15)
BUN: 10 mg/dL (ref 6–23)
CALCIUM: 8.2 mg/dL — AB (ref 8.4–10.5)
CO2: 21 mEq/L (ref 19–32)
Chloride: 102 mEq/L (ref 96–112)
Creatinine, Ser: 1.14 mg/dL — ABNORMAL HIGH (ref 0.50–1.10)
GFR, EST AFRICAN AMERICAN: 61 mL/min — AB (ref >90)
GFR, EST NON AFRICAN AMERICAN: 52 mL/min — AB (ref >90)
GLUCOSE: 96 mg/dL (ref 70–99)
Potassium: 4.1 mEq/L (ref 3.7–5.3)
SODIUM: 132 meq/L — AB (ref 137–147)
Total Bilirubin: 0.9 mg/dL (ref 0.3–1.2)
Total Protein: 5.1 g/dL — ABNORMAL LOW (ref 6.0–8.3)

## 2013-09-11 LAB — GLUCOSE, CAPILLARY: Glucose-Capillary: 69 mg/dL — ABNORMAL LOW (ref 70–99)

## 2013-09-11 LAB — OCCULT BLOOD X 1 CARD TO LAB, STOOL: Fecal Occult Bld: NEGATIVE

## 2013-09-11 LAB — HEMOGLOBIN AND HEMATOCRIT, BLOOD
HCT: 26.8 % — ABNORMAL LOW (ref 36.0–46.0)
HCT: 27.5 % — ABNORMAL LOW (ref 36.0–46.0)
HEMOGLOBIN: 9.2 g/dL — AB (ref 12.0–15.0)
HEMOGLOBIN: 9.5 g/dL — AB (ref 12.0–15.0)

## 2013-09-11 LAB — PROTIME-INR
INR: 1.34 (ref 0.00–1.49)
Prothrombin Time: 16.6 seconds — ABNORMAL HIGH (ref 11.6–15.2)

## 2013-09-11 SURGERY — EGD (ESOPHAGOGASTRODUODENOSCOPY)
Anesthesia: Moderate Sedation

## 2013-09-11 MED ORDER — LEVOFLOXACIN IN D5W 500 MG/100ML IV SOLN: 500.0000 mg | INTRAVENOUS | Status: DC

## 2013-09-11 MED ORDER — BUTAMBEN-TETRACAINE-BENZOCAINE 2-2-14 % EX AERO
INHALATION_SPRAY | CUTANEOUS | Status: DC | PRN
  Administered 2013-09-11: 2 via TOPICAL

## 2013-09-11 MED ORDER — MEPERIDINE HCL 50 MG/ML IJ SOLN
INTRAMUSCULAR | Status: AC
  Filled 2013-09-11: qty 1

## 2013-09-11 MED ORDER — PANTOPRAZOLE SODIUM 40 MG PO TBEC
40.0000 mg | DELAYED_RELEASE_TABLET | Freq: Every day | ORAL | Status: DC
  Administered 2013-09-11 – 2013-09-13 (×3): 40 mg via ORAL
  Filled 2013-09-11 (×3): qty 1

## 2013-09-11 MED ORDER — SODIUM CHLORIDE 0.9 % IV SOLN: INTRAVENOUS | Status: DC

## 2013-09-11 MED ORDER — SIMETHICONE 40 MG/0.6ML PO SUSP
ORAL | Status: DC | PRN
  Administered 2013-09-11: 13:00:00

## 2013-09-11 MED ORDER — MIDAZOLAM HCL 5 MG/5ML IJ SOLN
INTRAMUSCULAR | Status: AC
Start: 2013-09-11 — End: 2013-09-12
  Filled 2013-09-11: qty 10

## 2013-09-11 MED ORDER — MIDAZOLAM HCL 5 MG/5ML IJ SOLN
INTRAMUSCULAR | Status: DC | PRN
  Administered 2013-09-11 (×7): 2 mg via INTRAVENOUS

## 2013-09-11 MED ORDER — SODIUM CHLORIDE 0.9 % IV SOLN
INTRAVENOUS | Status: DC
  Administered 2013-09-11: 10:00:00 via INTRAVENOUS

## 2013-09-11 MED ORDER — MEPERIDINE HCL 50 MG/ML IJ SOLN
INTRAMUSCULAR | Status: DC | PRN
  Administered 2013-09-11 (×3): 25 mg via INTRAVENOUS

## 2013-09-11 MED ORDER — OXYCODONE-ACETAMINOPHEN 5-325 MG PO TABS
1.0000 | ORAL_TABLET | ORAL | Status: DC | PRN
  Administered 2013-09-11 – 2013-09-12 (×2): 1 via ORAL
  Filled 2013-09-11 (×2): qty 1

## 2013-09-11 NOTE — Progress Notes (Signed)
ANTIBIOTIC CONSULT NOTE - follow up  Pharmacy Consult for Levaquin Indication: UTI  Allergies  Allergen Reactions  . Penicillins Rash   Patient Measurements: Height: 5' 5.5" (166.4 cm) Weight: 225 lb 12 oz (102.4 kg) IBW/kg (Calculated) : 58.15  Vital Signs: Temp: 97.9 F (36.6 C) (08/05 0730) Temp src: Axillary (08/05 0730) BP: 118/81 mmHg (08/05 0900) Pulse Rate: 74 (08/05 0900) Intake/Output from previous day: 08/04 0701 - 08/05 0700 In: 4140.4 [P.O.:240; I.V.:3565.4; Blood:335] Out: 1900 [Urine:1900] Intake/Output from this shift: Total I/O In: 630 [P.O.:480; I.V.:150] Out: -   Labs:  Recent Labs  09/10/13 0430 09/10/13 1848 09/11/13 0135  WBC  --   --  3.8*  HGB 6.4* 7.8* 9.0*  PLT  --   --  51*  CREATININE 1.19*  --  1.14*   Estimated Creatinine Clearance: 65.2 ml/min (by C-G formula based on Cr of 1.14). No results found for this basename: VANCOTROUGH, Leodis Binet, VANCORANDOM, GENTTROUGH, GENTPEAK, GENTRANDOM, TOBRATROUGH, TOBRAPEAK, TOBRARND, AMIKACINPEAK, AMIKACINTROU, AMIKACIN,  in the last 72 hours   Microbiology: Recent Results (from the past 720 hour(s))  MRSA PCR SCREENING     Status: Abnormal   Collection Time    09/09/13 10:00 PM      Result Value Ref Range Status   MRSA by PCR POSITIVE (*) NEGATIVE Final   Comment:            The GeneXpert MRSA Assay (FDA     approved for NASAL specimens     only), is one component of a     comprehensive MRSA colonization     surveillance program. It is not     intended to diagnose MRSA     infection nor to guide or     monitor treatment for     MRSA infections.     RESULT CALLED TO, READ BACK BY AND VERIFIED WITH:     WAGONER R AT 0120 ON [id number] BY FORSYTH K   Medical History: Past Medical History  Diagnosis Date  . Hypertension   . Enteritis presumed infectious   . Crohn's   . Crohn's   . Anemia   . Hepatitis C antibody test positive   . Hepatitis    Levaquin 8/4 >>  Assessment: 58yo  female admitted with GIB.  Asked to initiate Levaquin empirically for suspected UTI.  Estimated Creatinine Clearance: 65.2 ml/min (by C-G formula based on Cr of 1.14).  Goal of Therapy:  Eradicate infection.  Plan:  Levaquin 750mg  IV q24hrs Switch to PO when improved / appropriate Monitor labs, renal fxn, and cultures Continue to follow clinically  Valrie Hart A 09/11/2013,10:16 AM

## 2013-09-11 NOTE — Progress Notes (Signed)
Pt is able to sleep and wakes readily  No obvious or stated difficulty No further

## 2013-09-11 NOTE — Progress Notes (Signed)
[facility] PROGRESS NOTE  Kathryn [name] ZOX:[id number] DOB: 04-11-1955 DOA: 09/09/2013 PCP: [name], MD  Assessment/Plan: 1. GI bleeding.  1. Pt with fresh blood and dark clots in her colostomy bag since 8/2.  2. Hemoglobin had dropped significantly.  3. She has a history of Crohn's disease, but denies any abdominal pain, vomiting or any other symptoms. She is being transfused 2 units of PRBCs with correction of anemia 4. Gastroenterology has been consulted with poss plans for endoscopy.  5. She remains on a Protonix infusion 2. Acute blood loss anemia. 1. Baseline hemoglobin appeared to be around 11.  2. Pt presented with a hemoglobin of 9 at [facility] ER.  3. Hemoglobin has dropped down to 6.4.  4. S/p tx with 2 units of PRBCs. 3. Coagulopathy.  1. INR is elevated at 1.5.  2. Suspected related to liver disease.  3. Pt was given one dose of vitamin K 4. History of hepatitis C.  1. Patient is on Harvoni per GI. 5. History of hypertension.  1. Meds currently on hold due to borderline blood pressures and ongoing GI bleeding.  2. Plan to resume home meds once blood pressures have stabilized 6. History of Crohn's disease status post colostomy.  1. Would defer mgt to GI. 7. Possible UTI.  1. Urinalysis from our hospital indicates 30-40 WBCs.  2. She's been started empirically on Levaquin.  3. Urine cultures pending 8. Elevated lipase.  1. Unclear etiology.  2. Continue to follow clinically. 9. Thrombocytopenia.  1. Likely related to chronic liver disease.  2. Continue to follow  Code Status: Full Family Communication: No family present, pt in room (indicate person spoken with, relationship, and if by phone, the number) Disposition Plan: Pending   Consultants:  GI  Procedures:  EGD and colon pending  Antibiotics:  Levaquin 8/4>>>  HPI/Subjective: No complaints. Denies abd pain  Objective: Filed Vitals:   09/11/13 0500 09/11/13 0600 09/11/13 0700  09/11/13 0730  BP: 116/49  117/71   Pulse: 71 102 77   Temp:    97.9 F (36.6 C)  TempSrc:    Axillary  Resp: 15 23 17    Height:      Weight:      SpO2: 100% 100% 99%     Intake/Output Summary (Last 24 hours) at 09/11/13 0826 Last data filed at 09/11/13 0600  Gross per 24 hour  Intake 3825.42 ml  Output   1900 ml  Net 1925.42 ml   Filed Weights   09/09/13 2113  Weight: 102.4 kg (225 lb 12 oz)    Exam:   General:  Awake, in nad  Cardiovascular: regular, s1, s2  Respiratory: normal resp effort, no wheezing  Abdomen: soft,nondistended  Musculoskeletal: perfused, no clubbing   Data Reviewed: Basic Metabolic Panel:  Recent Labs Lab 09/10/13 0430 09/11/13 0135  NA 135* 132*  K 4.5 4.1  CL 105 102  CO2 20 21  GLUCOSE 123* 96  BUN 14 10  CREATININE 1.19* 1.14*  CALCIUM 8.2* 8.2*   Liver Function Tests:  Recent Labs Lab 09/10/13 0430 09/11/13 0135  AST 18 19  ALT 10 11  ALKPHOS 84 68  BILITOT 0.5 0.9  PROT 5.3* 5.1*  ALBUMIN 2.6* 2.5*    Recent Labs Lab 09/10/13 0430  LIPASE 149*   No results found for this basename: AMMONIA,  in the last 168 hours CBC:  Recent Labs Lab 09/09/13 2301 09/10/13 0430 09/10/13 1848 09/11/13 0135  WBC  --   --   --  3.8*  HGB 7.0* 6.4* 7.8* 9.0*  HCT 20.2* 19.1* 22.6* 26.1*  MCV  --   --   --  85.0  PLT  --   --   --  51*   Cardiac Enzymes: No results found for this basename: CKTOTAL, CKMB, CKMBINDEX, TROPONINI,  in the last 168 hours BNP (last 3 results) No results found for this basename: PROBNP,  in the last 8760 hours CBG: No results found for this basename: GLUCAP,  in the last 168 hours  Recent Results (from the past 240 hour(s))  MRSA PCR SCREENING     Status: Abnormal   Collection Time    09/09/13 10:00 PM      Result Value Ref Range Status   MRSA by PCR POSITIVE (*) NEGATIVE Final   Comment:            The GeneXpert MRSA Assay (FDA     approved for NASAL specimens     only), is one  component of a     comprehensive MRSA colonization     surveillance program. It is not     intended to diagnose MRSA     infection nor to guide or     monitor treatment for     MRSA infections.     RESULT CALLED TO, READ BACK BY AND VERIFIED WITH:     WAGONER R AT 0120 ON [id number] BY FORSYTH K     Studies: US Abdomen Complete  09/10/2013   CLINICAL DATA:  Elevated lipase.  GI bleeding.  Hepatitis C.  EXAM: ULTRASOUND ABDOMEN COMPLETE  COMPARISON:  06/20/2013  FINDINGS: Gallbladder:  Removed.  Common bile duct:  Diameter: 4.4 mm, normal.  Liver:  The liver has a coarse echotexture and has slight nodularity of the contour. No focal lesions.  IVC:  No abnormality visualized.  Pancreas:  Visualized portion unremarkable.  Spleen:  Splenomegaly.  14.5 cm in length.  Right Kidney:  Length: 11.5 cm. Echogenicity within normal limits. No mass or hydronephrosis visualized.  Left Kidney:  Length: 10.9 cm. Echogenicity within normal limits. No mass or hydronephrosis visualized.  Abdominal aorta:  2.4 cm maximum diameter.  Other findings:  None.  IMPRESSION: No acute abnormality. Chronic splenomegaly. Chronic nodularity and coarse echogenicity of the liver parenchyma consistent with the history of chronic liver disease.   Electronically Signed   By: Geanie Cooley M.D.   On: 09/10/2013 11:23    Scheduled Meds: . Chlorhexidine Gluconate Cloth  6 each Topical Q0600  . gabapentin  100 mg Oral TID  . levofloxacin (LEVAQUIN) IV  750 mg Intravenous Q24H  . mupirocin ointment  1 application Nasal BID  . [START ON 09/13/2013] pantoprazole (PROTONIX) IV  40 mg Intravenous Q12H  . polyethylene glycol  4,000 mL Oral Once   Continuous Infusions: . sodium chloride 75 mL/hr at 09/10/13 1800  . pantoprozole (PROTONIX) infusion 8 mg/hr (09/10/13 1147)    Active Problems:   Crohn's disease   Acute blood loss anemia   Cirrhosis of liver without mention of alcohol   Hepatitis C antibody test positive   Insomnia   GI  bleed   Thrombocytopenia, unspecified   UTI (urinary tract infection)   Coagulopathy  Time spent:  Rocko Fesperman K  [facility] Pager [phone]. If 7PM-7AM, please contact night-coverage at [email or web address], password [email or web address] 09/11/2013, 8:26 AM  LOS: 2 days

## 2013-09-11 NOTE — Progress Notes (Signed)
Pt to endoscopy.

## 2013-09-11 NOTE — Op Note (Signed)
EGD AND ILEOSCOPY PROCEDURE REPORT  PATIENT:  Kathryn [name]  MR#:  [id number] Birthdate:  21-Jun-1955, 58 y.o., female Endoscopist:  Dr. [name], MD Referred By:  Dr. Erick Blinks, MD Procedure Date: 09/11/2013  Procedure:   EGD & ileoscopy.  Indications:  Patient is 58 year old Caucasian female who presents with acute GI bleed and anemia. She has received 3 units of PRBCs and hemoglobin is up to 9 from low 6.4. She has history of Crohn disease and has undergone multiple surgeries now with ileostomy and appears to be in remission. 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 She has cleared HCV RNA with Harvoni. She is undergoing EGD followed by ileoscopy.           Informed Consent:  The risks, benefits, alternatives & imponderables which include, but are not limited to, bleeding, infection, perforation, drug reaction and potential missed lesion have been reviewed.  The potential for biopsy, lesion removal, esophageal dilation, etc. have also been discussed.  Questions have been answered.  All parties agreeable.  Please see history & physical in medical record for more information.  Medications:  Demerol 75 mg IV Versed 14mg  IV Cetacaine spray topically for oropharyngeal anesthesia  EGD  Description of procedure:  The endoscope was introduced through the mouth and advanced to the second portion of the duodenum without difficulty or limitations. The mucosal surfaces were surveyed very carefully during advancement of the scope and upon withdrawal.  Findings:  Esophagus:  Mucosa of the esophagus was normal. No varices noted. GE junction was unremarkable. GEJ:  40 cm Stomach:  Stomach is empty and distended very well with insufflation. Folds in the proximal stomach normal. Examination of mucosa and gastric body revealed mosaic pattern. Antral mucosa was normal. Pyloric channel was patent. Angularis fundus and cardia was also examined and were normal. Duodenum:   Bulbar mucosa was normal. Scope was advanced to the second and third part of the duodenum the mucosa and folds were normal.  Therapeutic/Diagnostic Maneuvers Performed:  None  ILEOSCOPY Description of procedure:   Patient was placed in supine position. Ileostomy bag was removed. Digital exam of ileostomy performed performed. No stricture noted. There was edema and erythema to mucosa at ileostomy without ulceration. Pediatric colonoscope was introduced via ileostomy and advanced as far as possible. Scope was passed to 35 cm pleating ileum along the way.  Findings:   Normal mucosa of distal small bowel are or ileum. Mucosal edema and erythema noted to mucosa at ileostomy.  Therapeutic/Diagnostic Maneuvers Performed:  None  Complications:  None   Impression:   EGD findings; Mild portal gastropathy otherwise normal EGD without evidence of varices peptic ulcer disease or vascular abnormalities.  Ileoscopy findings; No bleeding lesion identified in the segments that were examined. Normal appearing mucosa of distal small bowel. Focal edema and erythema to mucosa at ileostomy site.  Recommendations:  Clear liquids today. Small bowel given capsule study in a.m. If patient bleeds again we'll proceed with GI bleeding scan.  Tawan Degroote U  09/11/2013 2:29 PM  CC: Dr. [name], MD & Dr. Bonnetta Barry ref. provider found

## 2013-09-11 NOTE — Progress Notes (Signed)
Patient ID: Kathryn [name], female   DOB: 04-28-1955, 58 y.o.   MRN: [id number] Feel better. Has started prep. Clear yellow fluid in ileostomy bag. Filed Vitals:   09/11/13 0700 09/11/13 0730 09/11/13 0800 09/11/13 0900  BP: 117/71  128/97 118/81  Pulse: 77  80 74  Temp:  97.9 F (36.6 C)    TempSrc:  Axillary    Resp: 17  17 16   Height:      Weight:      SpO2: 99%  100% 100%  Scheduled for EGD/Colonoscopy today.

## 2013-09-12 ENCOUNTER — Encounter (HOSPITAL_COMMUNITY)
Admission: AD | Disposition: A | Discharge status: Home / Self Care | Payer: Self-pay | Source: Other Acute Inpatient Hospital | Attending: Internal Medicine

## 2013-09-12 ENCOUNTER — Encounter (HOSPITAL_COMMUNITY): Payer: Self-pay | Admitting: *Deleted

## 2013-09-12 ENCOUNTER — Inpatient Hospital Stay (HOSPITAL_COMMUNITY): Payer: Medicare HMO

## 2013-09-12 DIAGNOSIS — R894 Abnormal immunological findings in specimens from other organs, systems and tissues: Secondary | ICD-10-CM

## 2013-09-12 DIAGNOSIS — D649 Anemia, unspecified: Secondary | ICD-10-CM

## 2013-09-12 DIAGNOSIS — Z9889 Other specified postprocedural states: Secondary | ICD-10-CM

## 2013-09-12 HISTORY — PX: GIVENS CAPSULE STUDY: SHX5432

## 2013-09-12 LAB — CBC
HEMATOCRIT: 25 % — AB (ref 36.0–46.0)
HEMOGLOBIN: 8.5 g/dL — AB (ref 12.0–15.0)
MCH: 29.4 pg (ref 26.0–34.0)
MCHC: 34 g/dL (ref 30.0–36.0)
MCV: 86.5 fL (ref 78.0–100.0)
Platelets: 55 10*3/uL — ABNORMAL LOW (ref 150–400)
RBC: 2.89 MIL/uL — ABNORMAL LOW (ref 3.87–5.11)
RDW: 16.1 % — ABNORMAL HIGH (ref 11.5–15.5)
WBC: 2.8 10*3/uL — ABNORMAL LOW (ref 4.0–10.5)

## 2013-09-12 SURGERY — IMAGING PROCEDURE, GI TRACT, INTRALUMINAL, VIA CAPSULE

## 2013-09-12 SURGERY — CANCELLED PROCEDURE

## 2013-09-12 MED ORDER — GADOXETATE DISODIUM 0.25 MMOL/ML IV SOLN
10.0000 mL | Freq: Once | INTRAVENOUS | Status: AC | PRN
  Administered 2013-09-12: 10 mL via INTRAVENOUS

## 2013-09-12 MED ORDER — SODIUM CHLORIDE 0.9 % IV SOLN
INTRAVENOUS | Status: AC
Start: 2013-09-12 — End: 2013-09-13
  Filled 2013-09-12: qty 150

## 2013-09-12 NOTE — Op Note (Signed)
Small Bowel Givens Capsule Study Procedure date:  09/12/2013.  Referring Provider:  Dr. Erick Blinks, MD PCP:  Dr. [name], MD  Indication for procedure:  Patient is 58 year old Caucasian female with history of Crohn's disease who has been in remission and history of cirrhosis who is undergoing therapy for hep C presented with presented with acute GI bleed and anemia requiring 3 units of PRBCs. She had EGD and ileoscopy yesterday and no bleeding lesion was identified. She is therefore undergoing small bowel Givens capsule study.   Findings:   Patient was able to swallow given capsule without any difficulty. Given capsule reached the ileostomy in 2 hours and 20 minutes. Small bluish bump noted at 31 minutes and 43 seconds and another one at 51 minutes and 13 seconds Patchy edema noted the mucosa of mid small bowel.  First Gastric image:  14 sec. First Duodenal image: 17 min and 4 sec First Ileo-Cecal Valve image: NA as patient has ileostomy. First ileostomy image: 2 hr and 20 min Gastric Passage time: 17 min Small Bowel Passage time:  2 hr 3 min  Summary; No bleeding lesion noted on this study. Two small bumps with purple discoloration of questionable significance. Appearance not suggestive of varices. Rapid transit of given capsule through small bowel reaching ileostomy in 2 hours and 20 minutes.    Recommendations: MR Abdomen without and with contrast. If bleeding recurs will obtain GI bleeding scan.

## 2013-09-12 NOTE — Progress Notes (Signed)
[facility] PROGRESS NOTE  Kathryn MCDUFF YQI:[id number] DOB: January 23, 1956 DOA: 09/09/2013 PCP: [name], MD  Assessment/Plan: 1. GI bleeding.  1. Pt with fresh blood and dark clots in her colostomy bag since 8/2.  2. Hemoglobin had dropped significantly.  3. She has a history of Crohn's disease, but denies any abdominal pain, vomiting or any other symptoms. Pt is s/p 2 units of PRBCs with correction of anemia 4. Gastroenterology has been consulted and is s/p unremarkable endoscopy 8/5 5. Plan for pill endoscopy this AM per GI 2. Acute blood loss anemia. 1. Baseline hemoglobin appeared to be around 11.  2. Pt presented with a hemoglobin of 9 at [facility] ER.  3. Hemoglobin has dropped down to 6.4 at admission  4. S/p tx with 2 units of PRBCs. 3. Coagulopathy.  1. INR is elevated at 1.5.  2. Suspected related to liver disease.  3. Pt was given one dose of vitamin K 4. History of hepatitis C.  1. Patient is on Harvoni per GI. 5. History of hypertension.  1. Meds currently on hold due to borderline blood pressures and ongoing GI bleeding.  2. Plan to resume home meds once blood pressures have stabilized 6. History of Crohn's disease status post colostomy.  1. Would defer mgt to GI. 7. Possible UTI.  1. Urinalysis from our hospital indicates 30-40 WBCs.  2. She's been started empirically on Levaquin.  3. Urine cultures pending 8. Elevated lipase.  1. Unclear etiology.  2. Continue to follow clinically. 9. Pancytopenia  1. Likely related to chronic liver disease.  2. Appears stable thus far 3. Continue to follow  Code Status: Full Family Communication: Pt in room Disposition Plan: Transfer to floor   Consultants:  GI  Procedures:  EGD and colon 8/5  Antibiotics:  Levaquin 8/4>>>  HPI/Subjective: No complaints.  Objective: Filed Vitals:   09/12/13 0500 09/12/13 0600 09/12/13 0656 09/12/13 0730  BP: 127/67 114/66    Pulse: 80 77    Temp:    97.9 F  (36.6 C)  TempSrc:    Oral  Resp: 20 27    Height:   5\' 5"  (1.651 m)   Weight: 108.9 kg (240 lb 1.3 oz)  108.863 kg (240 lb)   SpO2: 100% 98%      Intake/Output Summary (Last 24 hours) at 09/12/13 0929 Last data filed at 09/12/13 0730  Gross per 24 hour  Intake   3430 ml  Output   1950 ml  Net   1480 ml   Filed Weights   09/09/13 2113 09/12/13 0500 09/12/13 0656  Weight: 102.4 kg (225 lb 12 oz) 108.9 kg (240 lb 1.3 oz) 108.863 kg (240 lb)    Exam:   General:  Awake, in nad  Cardiovascular: regular, s1, s2  Respiratory: normal resp effort, no wheezing  Abdomen: soft,nondistended  Musculoskeletal: perfused, no clubbing   Data Reviewed: Basic Metabolic Panel:  Recent Labs Lab 09/10/13 0430 09/11/13 0135 09/11/13 1621  NA 135* 132* 137  K 4.5 4.1 3.7  CL 105 102 104  CO2 20 21 22   GLUCOSE 123* 96 138*  BUN 14 10 7   CREATININE 1.19* 1.14* 1.03  CALCIUM 8.2* 8.2* 8.1*   Liver Function Tests:  Recent Labs Lab 09/10/13 0430 09/11/13 0135  AST 18 19  ALT 10 11  ALKPHOS 84 68  BILITOT 0.5 0.9  PROT 5.3* 5.1*  ALBUMIN 2.6* 2.5*    Recent Labs Lab 09/10/13 0430  LIPASE 149*  No results found for this basename: AMMONIA,  in the last 168 hours CBC:  Recent Labs Lab 09/10/13 1848 09/11/13 0135 09/11/13 1038 09/11/13 1621 09/12/13 0442  WBC  --  3.8*  --   --  2.8*  HGB 7.8* 9.0* 9.2* 9.5* 8.5*  HCT 22.6* 26.1* 26.8* 27.5* 25.0*  MCV  --  85.0  --   --  86.5  PLT  --  51*  --   --  55*   Cardiac Enzymes: No results found for this basename: CKTOTAL, CKMB, CKMBINDEX, TROPONINI,  in the last 168 hours BNP (last 3 results) No results found for this basename: PROBNP,  in the last 8760 hours CBG:  Recent Labs Lab 09/11/13 2112  GLUCAP 69*    Recent Results (from the past 240 hour(s))  MRSA PCR SCREENING     Status: Abnormal   Collection Time    09/09/13 10:00 PM      Result Value Ref Range Status   MRSA by PCR POSITIVE (*) NEGATIVE  Final   Comment:            The GeneXpert MRSA Assay (FDA     approved for NASAL specimens     only), is one component of a     comprehensive MRSA colonization     surveillance program. It is not     intended to diagnose MRSA     infection nor to guide or     monitor treatment for     MRSA infections.     RESULT CALLED TO, READ BACK BY AND VERIFIED WITH:     WAGONER R AT 0120 ON [id number] BY FORSYTH K  URINE CULTURE     Status: None   Collection Time    09/10/13  4:12 PM      Result Value Ref Range Status   Specimen Description URINE, CLEAN CATCH   Final   Special Requests NONE   Final   Culture  Setup Time     Final   Value: 09/10/2013 23:47     Performed at [facility] Count     Final   Value: 25,000 COLONIES/ML     Performed at [facility]   Culture     Final   Value: Multiple bacterial morphotypes present, none predominant. Suggest appropriate recollection if clinically indicated.     Performed at [facility]   Report Status 09/11/2013 FINAL   Final     Studies: US Abdomen Complete  09/10/2013   CLINICAL DATA:  Elevated lipase.  GI bleeding.  Hepatitis C.  EXAM: ULTRASOUND ABDOMEN COMPLETE  COMPARISON:  06/20/2013  FINDINGS: Gallbladder:  Removed.  Common bile duct:  Diameter: 4.4 mm, normal.  Liver:  The liver has a coarse echotexture and has slight nodularity of the contour. No focal lesions.  IVC:  No abnormality visualized.  Pancreas:  Visualized portion unremarkable.  Spleen:  Splenomegaly.  14.5 cm in length.  Right Kidney:  Length: 11.5 cm. Echogenicity within normal limits. No mass or hydronephrosis visualized.  Left Kidney:  Length: 10.9 cm. Echogenicity within normal limits. No mass or hydronephrosis visualized.  Abdominal aorta:  2.4 cm maximum diameter.  Other findings:  None.  IMPRESSION: No acute abnormality. Chronic splenomegaly. Chronic nodularity and coarse echogenicity of the liver parenchyma consistent with the history of chronic  liver disease.   Electronically Signed   By: Geanie Cooley M.D.   On: 09/10/2013 11:23    Scheduled  Meds: . Chlorhexidine Gluconate Cloth  6 each Topical Q0600  . gabapentin  100 mg Oral TID  . levofloxacin (LEVAQUIN) IV  750 mg Intravenous Q24H  . mupirocin ointment  1 application Nasal BID  . pantoprazole  40 mg Oral Daily   Continuous Infusions: . sodium chloride 20 mL/hr at 09/11/13 1800  . sodium chloride 20 mL/hr at 09/11/13 1000  . sodium chloride 20 mL/hr at 09/11/13 1815    Active Problems:   Crohn's disease   Acute blood loss anemia   Cirrhosis of liver without mention of alcohol   Hepatitis C antibody test positive   Insomnia   GI bleed   Thrombocytopenia, unspecified   UTI (urinary tract infection)   Coagulopathy  Time spent:  Mavryk Pino K  [facility] Pager [phone]. If 7PM-7AM, please contact night-coverage at [email or web address], password [facility] 09/12/2013, 9:29 AM  LOS: 3 days

## 2013-09-12 NOTE — Progress Notes (Addendum)
PT TRANSFERRING TO ROOM 325 REPORT GIVEN TO JENNIFER RN ON 300 NO GI BLEED SINCE 09/12/2013 AM, IV PATENT, COLOSTOMY PATENT

## 2013-09-13 LAB — CBC
HEMATOCRIT: 24.6 % — AB (ref 36.0–46.0)
Hemoglobin: 8.4 g/dL — ABNORMAL LOW (ref 12.0–15.0)
MCH: 29.7 pg (ref 26.0–34.0)
MCHC: 34.1 g/dL (ref 30.0–36.0)
MCV: 86.9 fL (ref 78.0–100.0)
Platelets: 57 10*3/uL — ABNORMAL LOW (ref 150–400)
RBC: 2.83 MIL/uL — ABNORMAL LOW (ref 3.87–5.11)
RDW: 15.9 % — AB (ref 11.5–15.5)
WBC: 2.4 10*3/uL — ABNORMAL LOW (ref 4.0–10.5)

## 2013-09-13 LAB — BASIC METABOLIC PANEL
Anion gap: 10 (ref 5–15)
BUN: 7 mg/dL (ref 6–23)
CHLORIDE: 104 meq/L (ref 96–112)
CO2: 23 mEq/L (ref 19–32)
CREATININE: 1.1 mg/dL (ref 0.50–1.10)
Calcium: 8.4 mg/dL (ref 8.4–10.5)
GFR calc non Af Amer: 55 mL/min — ABNORMAL LOW (ref >90)
GFR, EST AFRICAN AMERICAN: 63 mL/min — AB (ref >90)
GLUCOSE: 117 mg/dL — AB (ref 70–99)
Potassium: 3.8 mEq/L (ref 3.7–5.3)
Sodium: 137 mEq/L (ref 137–147)

## 2013-09-13 MED ORDER — LEVOFLOXACIN 750 MG PO TABS
750.0000 mg | ORAL_TABLET | Freq: Every day | ORAL | Status: DC
  Administered 2013-09-13: 750 mg via ORAL
  Filled 2013-09-13: qty 1

## 2013-09-13 MED ORDER — NADOLOL 20 MG PO TABS: 20.0000 mg | ORAL_TABLET | Freq: Every day | ORAL | Status: DC

## 2013-09-13 MED ORDER — PANTOPRAZOLE SODIUM 40 MG PO TBEC: 40.0000 mg | DELAYED_RELEASE_TABLET | Freq: Every day | ORAL | Status: DC

## 2013-09-13 MED ORDER — LEVOFLOXACIN 750 MG PO TABS: 750.0000 mg | ORAL_TABLET | Freq: Every day | ORAL | Status: DC

## 2013-09-13 NOTE — Discharge Summary (Addendum)
Physician Discharge Summary  Kathryn [name]:[id number] DOB: [birth year] DOA: 09/09/2013  PCP: [name], MD  Admit date: 09/09/2013 Discharge date: 09/13/2013  Time spent: 35 minutes  Recommendations for Outpatient Follow-up:  1. Follow up with PCP in 1-2 weeks 2. Follow up with Gastroenterology as scheduled  Discharge Diagnoses:  Active Problems:   Crohn's disease   Acute blood loss anemia   Cirrhosis of liver without mention of alcohol   Hepatitis C antibody test positive   Insomnia   GI bleed   Thrombocytopenia, unspecified   UTI (urinary tract infection)   Coagulopathy GI bleed possibly from H. pylori gastritis NSAID gastropathy vs gastric Crohn's disease   Discharge Condition: Improved  Diet recommendation: Regular  Filed Weights   09/09/13 2113 09/12/13 0500 09/12/13 0656  Weight: 102.4 kg (225 lb 12 oz) 108.9 kg (240 lb 1.3 oz) 108.863 kg (240 lb)    History of present illness:  See admit h and p from 8/3 for details. Briefly, pt presents to the hospital with gross blood in stool in the setting of hx of Crohn's disease. Pt noted to be anaemic with a hgb of 6.4. Pt admitted for further work up.  Hospital Course:  1. GI bleeding.  1. Pt with fresh blood and dark clots in her colostomy bag since 8/2.  2. Hemoglobin had dropped significantly.  3. She has a history of Crohn's disease, but denies any abdominal pain, vomiting or any other symptoms. Pt is s/p 2 units of PRBCs with appropriate correction of anemia 4. Gastroenterology has been consulted and is s/p unremarkable endoscopy 8/5 5. Plan underwent pill endoscopy on 8/6 with findings suggestive of possible varices 6. GI recommendations for PPI and nadolol on d/c 2. Acute blood loss anemia.  1. Baseline hemoglobin appeared to be around 11.  2. Pt presented with a hemoglobin of 9 at [facility].  3. Hemoglobin has dropped down to 6.4 at admission  4. S/p tx with 2 units of PRBCs. 3. Coagulopathy.  1. INR is  elevated at 1.5.  2. Suspected related to liver disease.  3. Pt was given one dose of vitamin K 4. History of hepatitis C.  1. Patient is on Harvoni per GI. 5. History of hypertension.  1. Meds currently on hold due to borderline blood pressures and ongoing GI bleeding.  2. Plan to resume home meds once blood pressures have stabilized 6. History of Crohn's disease status post colostomy.  1. Stable. 7. Possible UTI.  1. Urinalysis from our hospital indicates 30-40 WBCs.  2. She's been started empirically on Levaquin. 8. Elevated lipase.  1. Unclear etiology.  2. Continue to follow clinically. 9. Pancytopenia  1. Likely related to chronic liver disease.  2. Appears stable thus far  Consultations:  GI  Discharge Exam: Filed Vitals:   09/12/13 1415 09/12/13 2153 09/13/13 0537 09/13/13 1342  BP: 111/83 115/58 117/91 110/62  Pulse: 82 85 87 95  Temp: 98.5 F (36.9 C) 98.8 F (37.1 C) 98.7 F (37.1 C) 99 F (37.2 C)  TempSrc: Oral Oral Oral Oral  Resp: 20 20 18 20   Height:      Weight:      SpO2: 100% 100% 99% 100%    General: awake, in nad Cardiovascular: regular, s1, s2 Respiratory: normal resp effort, no wheezing  Discharge Instructions     Medication List         acetaminophen 500 MG tablet  Commonly known as:  TYLENOL  Take 1,000 mg  by mouth every 6 (six) hours as needed for mild pain.     ALPRAZolam 1 MG tablet  Commonly known as:  XANAX  Take 1 tablet (1 mg total) by mouth at bedtime as needed for sleep.     gabapentin 100 MG capsule  Commonly known as:  NEURONTIN  Take 1 capsule (100 mg total) by mouth 3 (three) times daily.     HARVONI PO  Take by mouth.     levofloxacin 750 MG tablet  Commonly known as:  LEVAQUIN  Take 1 tablet (750 mg total) by mouth daily.     multivitamin with minerals tablet  Take 1 tablet by mouth daily.     nadolol 20 MG tablet  Commonly known as:  CORGARD  Take 1 tablet (20 mg total) by mouth daily.      pantoprazole 40 MG tablet  Commonly known as:  PROTONIX  Take 1 tablet (40 mg total) by mouth daily.     traMADol 50 MG tablet  Commonly known as:  ULTRAM  Take 1 tablet (50 mg total) by mouth 2 (two) times daily as needed for moderate pain.       Allergies  Allergen Reactions  . Penicillins Rash   Follow-up Information   Follow up with [name], MD. Schedule an appointment as soon as possible for a visit in 1 week.   Specialty:  Family Medicine   Contact information:   [address] [place] [address] [phone]        The results of significant diagnostics from this hospitalization (including imaging, microbiology, ancillary and laboratory) are listed below for reference.    Significant Diagnostic Studies: Mr Abdomen W Wo Contrast  09/13/2013   CLINICAL DATA:  Cirrhosis.  EXAM: MRI ABDOMEN WITHOUT AND WITH CONTRAST  TECHNIQUE: Multiplanar multisequence MR imaging of the abdomen was performed both before and after the administration of intravenous contrast.  CONTRAST:  10.0 cc Gadavist  COMPARISON:  06/09/2013  FINDINGS: No pleural effusion identified. No pericardial effusion. Relative hypertrophy of the caudate lobe and lateral segment of left hepatic lobe noted. The liver has a nodular contour. The portal vein appears increased in caliber. On the arterial phase images there are no abnormal areas of hyper enhancement to suggest hepatoma. Previous cholecystectomy. No biliary dilatation. The pancreas appears normal. The spleen is enlarged measuring 20 cm in length.  Normal appearance of the adrenal glands. The kidneys are on unremarkable.  Normal caliber of the abdominal aorta. No retroperitoneal adenopathy identified. A trace amount of ascites is noted within the upper abdomen. There is a ventral abdominal wall hernia which contains a nonobstructed loop of small bowel.  IMPRESSION: 1. Morphologic features of the liver compatible with cirrhosis. 2. 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   Electronically Signed   By: Signa Kell M.D.   On: 09/13/2013 08:19   US Abdomen Complete  09/10/2013   CLINICAL DATA:  Elevated lipase.  GI bleeding.  Hepatitis C.  EXAM: ULTRASOUND ABDOMEN COMPLETE  COMPARISON:  06/20/2013  FINDINGS: Gallbladder:  Removed.  Common bile duct:  Diameter: 4.4 mm, normal.  Liver:  The liver has a coarse echotexture and has slight nodularity of the contour. No focal lesions.  IVC:  No abnormality visualized.  Pancreas:  Visualized portion unremarkable.  Spleen:  Splenomegaly.  14.5 cm in length.  Right Kidney:  Length: 11.5 cm. Echogenicity within normal limits. No mass or hydronephrosis visualized.  Left Kidney:  Length: 10.9  cm. Echogenicity within normal limits. No mass or hydronephrosis visualized.  Abdominal aorta:  2.4 cm maximum diameter.  Other findings:  None.  IMPRESSION: No acute abnormality. Chronic splenomegaly. Chronic nodularity and coarse echogenicity of the liver parenchyma consistent with the history of chronic liver disease.   Electronically Signed   By: Geanie Cooley M.D.   On: 09/10/2013 11:23    Microbiology: Recent Results (from the past 240 hour(s))  MRSA PCR SCREENING     Status: Abnormal   Collection Time    09/09/13 10:00 PM      Result Value Ref Range Status   MRSA by PCR POSITIVE (*) NEGATIVE Final   Comment:            The GeneXpert MRSA Assay (FDA     approved for NASAL specimens     only), is one component of a     comprehensive MRSA colonization     surveillance program. It is not     intended to diagnose MRSA     infection nor to guide or     monitor treatment for     MRSA infections.     RESULT CALLED TO, READ BACK BY AND VERIFIED WITH:     WAGONER R AT 0120 ON [id number] BY FORSYTH K  URINE CULTURE     Status: None   Collection Time    09/10/13  4:12 PM      Result Value Ref Range Status   Specimen Description URINE, CLEAN CATCH   Final   Special Requests NONE   Final   Culture   Setup Time     Final   Value: 09/10/2013 23:47     Performed at [facility] Count     Final   Value: 25,000 COLONIES/ML     Performed at [facility]   Culture     Final   Value: Multiple bacterial morphotypes present, none predominant. Suggest appropriate recollection if clinically indicated.     Performed at [facility]   Report Status 09/11/2013 FINAL   Final     Labs: Basic Metabolic Panel:  Recent Labs Lab 09/10/13 0430 09/11/13 0135 09/11/13 1621 09/13/13 0601  NA 135* 132* 137 137  K 4.5 4.1 3.7 3.8  CL 105 102 104 104  CO2 20 21 22 23   GLUCOSE 123* 96 138* 117*  BUN 14 10 7 7   CREATININE 1.19* 1.14* 1.03 1.10  CALCIUM 8.2* 8.2* 8.1* 8.4   Liver Function Tests:  Recent Labs Lab 09/10/13 0430 09/11/13 0135  AST 18 19  ALT 10 11  ALKPHOS 84 68  BILITOT 0.5 0.9  PROT 5.3* 5.1*  ALBUMIN 2.6* 2.5*    Recent Labs Lab 09/10/13 0430  LIPASE 149*   No results found for this basename: AMMONIA,  in the last 168 hours CBC:  Recent Labs Lab 09/11/13 0135 09/11/13 1038 09/11/13 1621 09/12/13 0442 09/13/13 0601  WBC 3.8*  --   --  2.8* 2.4*  HGB 9.0* 9.2* 9.5* 8.5* 8.4*  HCT 26.1* 26.8* 27.5* 25.0* 24.6*  MCV 85.0  --   --  86.5 86.9  PLT 51*  --   --  55* 57*   Cardiac Enzymes: No results found for this basename: CKTOTAL, CKMB, CKMBINDEX, TROPONINI,  in the last 168 hours BNP: BNP (last 3 results) No results found for this basename: PROBNP,  in the last 8760 hours CBG:  Recent Labs Lab 09/11/13 2112  GLUCAP  69*   Signed:  Rigel Filsinger K  [facility] 09/13/2013, 2:29 PM

## 2013-09-13 NOTE — Progress Notes (Signed)
09/13/13 1542 Patient left floor in stable condition via w/c accompanied by nurse tech. Earnstine Regal, RN

## 2013-09-13 NOTE — Discharge Instructions (Signed)
Bloody Stools  Bloody stools often mean that there is a problem in the digestive tract. Your caregiver may use the term "melena" to describe black, tarry, and bad smelling stools or "hematochezia" to describe red or maroon-colored stools. Blood seen in the stool can be caused by bleeding anywhere along the intestinal tract.   A black stool usually means that blood is coming from the upper part of the gastrointestinal tract (esophagus, stomach, or small bowel). Passing maroon-colored stools or bright red blood usually means that blood is coming from lower down in the large bowel or the rectum. However, sometimes massive bleeding in the stomach or small intestine can cause bright red bloody stools.   Consuming black licorice, lead, iron pills, medicines containing bismuth subsalicylate, or blueberries can also cause black stools. Your caregiver can test black stools to see if blood is present.  It is important that the cause of the bleeding be found. Treatment can then be started, and the problem can be corrected. Rectal bleeding may not be serious, but you should not assume everything is okay until you know the cause. It is very important to follow up with your caregiver or a specialist in gastrointestinal problems.  CAUSES   Blood in the stools can come from various underlying causes. Often, the cause is not found during your first visit. Testing is often needed to discover the cause of bleeding in the gastrointestinal tract. Causes range from simple to serious or even life-threatening. Possible causes include:  · Hemorrhoids. These are veins that are full of blood (engorged) in the rectum. They cause pain, inflammation, and may bleed.  · Anal fissures. These are areas of painful tearing which may bleed. They are often caused by passing hard stool.  · Diverticulosis. These are pouches that form on the colon over time, with age, and may bleed significantly.  · Diverticulitis. This is inflammation in areas with  diverticulosis. It can cause pain, fever, and bloody stools, although bleeding is rare.  · Proctitis and colitis. These are inflamed areas of the rectum or colon. They may cause pain, fever, and bloody stools.  · Polyps and cancer. Colon cancer is a leading cause of preventable cancer death. It often starts out as precancerous polyps that can be removed during a colonoscopy, preventing progression into cancer. Sometimes, polyps and cancer may cause rectal bleeding.  · Gastritis and ulcers. Bleeding from the upper gastrointestinal tract (near the stomach) may travel through the intestines and produce black, sometimes tarry, often bad smelling stools. In certain cases, if the bleeding is fast enough, the stools may not be black, but red and the condition may be life-threatening.  SYMPTOMS   You may have stools that are bright red and bloody, that are normal color with blood on them, or that are dark black and tarry. In some cases, you may only have blood in the toilet bowl. Any of these cases need medical care. You may also have:  · Pain at the anus or anywhere in the rectum.  · Lightheadedness or feeling faint.  · Extreme weakness.  · Nausea or vomiting.  · Fever.  DIAGNOSIS  Your caregiver may use the following methods to find the cause of your bleeding:  · Taking a medical history. Age is important. Older people tend to develop polyps and cancer more often. If there is anal pain and a hard, large stool associated with bleeding, a tear of the anus may be the cause. If blood drips into the toilet after a bowel movement, bleeding hemorrhoids may be the   problem. The color and frequency of the bleeding are additional considerations. In most cases, the medical history provides clues, but seldom the final answer.  · A visual and finger (digital) exam. Your caregiver will inspect the anal area, looking for tears and hemorrhoids. A finger exam can provide information when there is tenderness or a growth inside. In men, the  prostate is also examined.  · Endoscopy. Several types of small, long scopes (endoscopes) are used to view the colon.  ¨ In the office, your caregiver may use a rigid, or more commonly, a flexible viewing sigmoidoscope. This exam is called flexible sigmoidoscopy. It is performed in 5 to 10 minutes.  ¨ A more thorough exam is accomplished with a colonoscope. It allows your caregiver to view the entire 5 to 6 foot long colon. Medicine to help you relax (sedative) is usually given for this exam. Frequently, a bleeding lesion may be present beyond the reach of the sigmoidoscope. So, a colonoscopy may be the best exam to start with. Both exams are usually done on an outpatient basis. This means the patient does not stay overnight in the hospital or surgery center.  ¨ An upper endoscopy may be needed to examine your stomach. Sedation is used and a flexible endoscope is put in your mouth, down to your stomach.  · A barium enema X-ray. This is an X-ray exam. It uses liquid barium inserted by enema into the rectum. This test alone may not identify an actual bleeding point. X-rays highlight abnormal shadows, such as those made by lumps (tumors), diverticuli, or colitis.  TREATMENT   Treatment depends on the cause of your bleeding.   · For bleeding from the stomach or colon, the caregiver doing your endoscopy or colonoscopy may be able to stop the bleeding as part of the procedure.  · Inflammation or infection of the colon can be treated with medicines.  · Many rectal problems can be treated with creams, suppositories, or warm baths.  · Surgery is sometimes needed.  · Blood transfusions are sometimes needed if you have lost a lot of blood.  · For any bleeding problem, let your caregiver know if you take aspirin or other blood thinners regularly.  HOME CARE INSTRUCTIONS   · Take any medicines exactly as prescribed.  · Keep your stools soft by eating a diet high in fiber. Prunes (1 to 3 a day) work well for many people.  · Drink  enough water and fluids to keep your urine clear or pale yellow.  · Take sitz baths if advised. A sitz bath is when you sit in a bathtub with warm water for 10 to 15 minutes to soak, soothe, and cleanse the rectal area.  · If enemas or suppositories are advised, be sure you know how to use them. Tell your caregiver if you have problems with this.  · Monitor your bowel movements to look for signs of improvement or worsening.  SEEK MEDICAL CARE IF:   · You do not improve in the time expected.  · Your condition worsens after initial improvement.  · You develop any new symptoms.  SEEK IMMEDIATE MEDICAL CARE IF:   · You develop severe or prolonged rectal bleeding.  · You vomit blood.  · You feel weak or faint.  · You have a fever.  MAKE SURE YOU:  · Understand these instructions.  · Will watch your condition.  · Will get help right away if you are not doing well or get worse.    Document Released: 01/14/2002 Document Revised: 04/18/2011 Document Reviewed: 06/11/2010  ExitCare® Patient Information ©2015 [employer]. This information is not intended to replace advice given to you by your health care provider. Make sure you discuss any questions you have with your health care provider.

## 2013-09-13 NOTE — Progress Notes (Signed)
Patient has no complaints. She denies abdominal pain. He is passing greenish brownish stool in the ileostomy. H&H is 8.4 and 24.6 WBC is 2.4 and platelet count 57K. MRI abdomen reviewed; shows changes of cirrhosis splenomegaly, small amount of ascites and small ventral hernia. No vascular abnormalities noted to account for patient's GI bleed.  Recommendations; Consider switching patient from Cardizem to Nadolol. Will check CBC next week and plan to see in the office in 2-3 weeks. Discussed with Dr. Rhona Leavenshiu.

## 2013-09-13 NOTE — Progress Notes (Signed)
09/13/13 1517 Reviewed discharge instructions with patient. Given copy of AVS, prescriptions, f/u appointment information. Reviewed GI bleed education sheet, s/s, when to seek medical care. Pt verbalized understanding. IV site d/c'd, within normal limits. No c/o pain or discomfort. Pt in stable condition awaiting husband arrival for discharge home. Earnstine Regal, RN

## 2013-09-13 NOTE — Progress Notes (Signed)
ANTIBIOTIC CONSULT NOTE - follow up  Pharmacy Consult for Levaquin Indication: UTI  Allergies  Allergen Reactions  . Penicillins Rash   Patient Measurements: Height: 5\' 5"  (165.1 cm) Weight: 240 lb (108.863 kg) IBW/kg (Calculated) : 57  Vital Signs: Temp: 98.7 F (37.1 C) (08/07 0537) Temp src: Oral (08/07 0537) BP: 117/91 mmHg (08/07 0537) Pulse Rate: 87 (08/07 0537) Intake/Output from previous day: 08/06 0701 - 08/07 0700 In: 750 [P.O.:600; IV Piggyback:150] Out: 600 [Urine:600] Intake/Output from this shift: Total I/O In: 120 [P.O.:120] Out: -   Labs:  Recent Labs  09/11/13 0135  09/11/13 1621 09/12/13 0442 09/13/13 0601  WBC 3.8*  --   --  2.8* 2.4*  HGB 9.0*  < > 9.5* 8.5* 8.4*  PLT 51*  --   --  55* 57*  CREATININE 1.14*  --  1.03  --  1.10  < > = values in this interval not displayed. Estimated Creatinine Clearance: 69.3 ml/min (by C-G formula based on Cr of 1.1). No results found for this basename: VANCOTROUGH, Leodis Binet, VANCORANDOM, GENTTROUGH, GENTPEAK, GENTRANDOM, TOBRATROUGH, TOBRAPEAK, TOBRARND, AMIKACINPEAK, AMIKACINTROU, AMIKACIN,  in the last 72 hours   Microbiology: Recent Results (from the past 720 hour(s))  MRSA PCR SCREENING     Status: Abnormal   Collection Time    09/09/13 10:00 PM      Result Value Ref Range Status   MRSA by PCR POSITIVE (*) NEGATIVE Final   Comment:            The GeneXpert MRSA Assay (FDA     approved for NASAL specimens     only), is one component of a     comprehensive MRSA colonization     surveillance program. It is not     intended to diagnose MRSA     infection nor to guide or     monitor treatment for     MRSA infections.     RESULT CALLED TO, READ BACK BY AND VERIFIED WITH:     WAGONER R AT 0120 ON [id number] BY FORSYTH K  URINE CULTURE     Status: None   Collection Time    09/10/13  4:12 PM      Result Value Ref Range Status   Specimen Description URINE, CLEAN CATCH   Final   Special Requests NONE    Final   Culture  Setup Time     Final   Value: 09/10/2013 23:47     Performed at [facility] Count     Final   Value: 25,000 COLONIES/ML     Performed at [facility]   Culture     Final   Value: Multiple bacterial morphotypes present, none predominant. Suggest appropriate recollection if clinically indicated.     Performed at [facility]   Report Status 09/11/2013 FINAL   Final   Medical History: Past Medical History  Diagnosis Date  . Hypertension   . Enteritis presumed infectious   . Crohn's   . Crohn's   . Anemia   . Hepatitis C antibody test positive   . Hepatitis    Levaquin 8/4 >>  Assessment: 58yo female admitted with GIB.  Asked to initiate Levaquin empirically for suspected UTI.  Estimated Creatinine Clearance: 69.3 ml/min (by C-G formula based on Cr of 1.1).  PHARMACIST - PHYSICIAN COMMUNICATION CONCERNING: Antibiotic IV to Oral Route Change Policy  RECOMMENDATION: This patient is receiving Levaquin by the intravenous route.  Based on  criteria approved by the Pharmacy and Therapeutics Committee, the antibiotic(s) is/are being converted to the equivalent oral dose form(s).  DESCRIPTION: These criteria include:  Patient being treated for a respiratory tract infection, urinary tract infection, cellulitis or clostridium difficile associated diarrhea if on metronidazole  The patient is not neutropenic and does not exhibit a GI malabsorption state  The patient is eating (either orally or via tube) and/or has been taking other orally administered medications for a least 24 hours  The patient is improving clinically and has a Tmax < 100.5  If you have questions about this conversion, please contact the Pharmacy Department  [x]   [phone]( [phone] )  [facility]arm[facility] will sign off.    Valrie HartHall, Marlynn Hinckley A 09/13/2013,9:08 AM

## 2013-09-16 ENCOUNTER — Telehealth (INDEPENDENT_AMBULATORY_CARE_PROVIDER_SITE_OTHER): Payer: Self-pay | Admitting: *Deleted

## 2013-09-16 ENCOUNTER — Ambulatory Visit (INDEPENDENT_AMBULATORY_CARE_PROVIDER_SITE_OTHER): Payer: Medicare HMO | Admitting: Family

## 2013-09-16 ENCOUNTER — Encounter: Payer: Self-pay | Admitting: Family

## 2013-09-16 VITALS — BP 97/64 | HR 70 | Temp 98.9°F | Ht 65.0 in | Wt 229.2 lb

## 2013-09-16 DIAGNOSIS — Z09 Encounter for follow-up examination after completed treatment for conditions other than malignant neoplasm: Secondary | ICD-10-CM

## 2013-09-16 DIAGNOSIS — K921 Melena: Secondary | ICD-10-CM

## 2013-09-16 NOTE — Patient Instructions (Signed)

## 2013-09-16 NOTE — Telephone Encounter (Signed)
Forwarded to Dr.Rehman. 

## 2013-09-16 NOTE — Progress Notes (Signed)
   Subjective:    Patient ID: Kathryn [name], female    DOB: 18-May-1955, 58 y.o.   MRN: [id number]  HPI Pt presents to the office for hospital follow-up. Pt states she woke up Sunday Am and had a large amount of blood in her ileostomy bag with clots. Pt was admitted to the ICU Monday night until Thursday. Pt was discharged Friday afternoon from the hospital. Pt states they never found where the exact blood was coming from. Pt states they think it may have been portal hypertension.  Pt states she had to 3 units of blood while she was in the hospital. Pt states she is not having any blood in her stool or urine, palpations, or edema. PT states she is feeling weak and SOB at times.   Pt see's Dr. [name] 8/24, but lab work to be drawn this week to follow-up on her counts.   Review of Systems  Constitutional: Positive for fatigue.  HENT: Negative.   Eyes: Negative.   Respiratory: Positive for shortness of breath.   Cardiovascular: Negative.  Negative for palpitations.  Gastrointestinal: Negative.   Endocrine: Negative.   Genitourinary: Negative.   Musculoskeletal: Negative.   Neurological: Negative.  Negative for headaches.  Hematological: Negative.   Psychiatric/Behavioral: Negative.   All other systems reviewed and are negative.      Objective:   Physical Exam  Vitals reviewed. Constitutional: She is oriented to person, place, and time. She appears well-developed and well-nourished. No distress.  HENT:  Head: Normocephalic and atraumatic.  Right Ear: External ear normal.  Left Ear: External ear normal.  Nose: Nose normal.  Mouth/Throat: Oropharynx is clear and moist.  Eyes: Pupils are equal, round, and reactive to light.  Neck: Normal range of motion. Neck supple. No thyromegaly present.  Cardiovascular: Normal rate, regular rhythm, normal heart sounds and intact distal pulses.   No murmur heard. Pulmonary/Chest: Effort normal and breath sounds normal. No respiratory distress. She  has no wheezes.  Abdominal: Soft. Bowel sounds are normal. She exhibits no distension. There is no tenderness.  Musculoskeletal: Normal range of motion. She exhibits no edema and no tenderness.  Neurological: She is alert and oriented to person, place, and time. She has normal reflexes. No cranial nerve deficit.  Skin: Skin is warm and dry. There is pallor.  Psychiatric: She has a normal mood and affect. Her behavior is normal. Judgment and thought content normal.   BP 97/64  Pulse 70  Temp(Src) 98.9 F (37.2 C) (Oral)  Ht 5\' 5"  (1.651 m)  Wt 229 lb 3.2 oz (103.964 kg)  BMI 38.14 kg/m2        Assessment & Plan:  1. Hospital discharge follow-up Continue all meds PT to get labs drawn this week at her GI dr Keep all appointments with GI Diet and exercise encouraged- Encouraged pt to rest while her counts are recovering Pt told to count to 5 when standing before moving or getting out of bed to prevent falls RTO prn   [name], FNP

## 2013-09-16 NOTE — Telephone Encounter (Signed)
Patient calls in to make her appt from hospital and to check on labs for this week.  Appt Given 8/24.  She has question about her BP medication  She is a little confused on the Cardizem and Nadolol. She hasn't been taking the Cardizem she thinks that is right but on Saturday she took BP it was 100/74.  She has had some shortness of breath.  Has not been able to take her BP since Saturday.   Can you clarify her medications and how she is supposed  To take it?

## 2013-09-17 ENCOUNTER — Telehealth (INDEPENDENT_AMBULATORY_CARE_PROVIDER_SITE_OTHER): Payer: Self-pay | Admitting: *Deleted

## 2013-09-17 DIAGNOSIS — B182 Chronic viral hepatitis C: Secondary | ICD-10-CM

## 2013-09-17 DIAGNOSIS — D509 Iron deficiency anemia, unspecified: Secondary | ICD-10-CM

## 2013-09-17 NOTE — Progress Notes (Signed)
UR chart review completed.  

## 2013-09-17 NOTE — Telephone Encounter (Signed)
Per Dr.Rehman the patient will need to have labs drawn on 09/19/13. I am to call Dr.Rehman with the results on 09/20/13.

## 2013-09-18 ENCOUNTER — Encounter (HOSPITAL_COMMUNITY): Payer: Self-pay | Admitting: Internal Medicine

## 2013-09-20 LAB — HEMOGLOBIN AND HEMATOCRIT, BLOOD
HCT: 25.1 % — ABNORMAL LOW (ref 36.0–46.0)
Hemoglobin: 8.2 g/dL — ABNORMAL LOW (ref 12.0–15.0)

## 2013-09-23 ENCOUNTER — Telehealth (INDEPENDENT_AMBULATORY_CARE_PROVIDER_SITE_OTHER): Payer: Self-pay | Admitting: *Deleted

## 2013-09-23 ENCOUNTER — Encounter (INDEPENDENT_AMBULATORY_CARE_PROVIDER_SITE_OTHER): Payer: Self-pay | Admitting: Internal Medicine

## 2013-09-23 DIAGNOSIS — D649 Anemia, unspecified: Secondary | ICD-10-CM

## 2013-09-23 NOTE — Telephone Encounter (Signed)
Per Dr.Rehman the patient will need to have labs drawn on 09/26/13.

## 2013-09-25 ENCOUNTER — Telehealth (INDEPENDENT_AMBULATORY_CARE_PROVIDER_SITE_OTHER): Payer: Self-pay | Admitting: *Deleted

## 2013-09-25 NOTE — Telephone Encounter (Signed)
Kathryn [name] stating she was having trouble breathing and running a low grade fever. Returned the call and advised patient she needed to go the the ED.  Voices understood.

## 2013-09-25 NOTE — Telephone Encounter (Signed)
Patient was to have another H&H drawn on Thursday , 09/26/13 per Dr.Rehman. Her H&H was 8.2 on 09/19/13. Patient called the office this morning and stated that she was having trouble breathing , SOB upon walking upstairs, dizzy/lightheaded, febrile. Patient was advised to go to the ED for evaluation.  This is forwarded to Dorene Arerri Setzer, NP for review.

## 2013-09-26 LAB — HEMOGLOBIN AND HEMATOCRIT, BLOOD
HCT: 28.7 % — ABNORMAL LOW (ref 36.0–46.0)
Hemoglobin: 9.5 g/dL — ABNORMAL LOW (ref 12.0–15.0)

## 2013-09-30 ENCOUNTER — Encounter (INDEPENDENT_AMBULATORY_CARE_PROVIDER_SITE_OTHER): Payer: Self-pay | Admitting: Internal Medicine

## 2013-09-30 ENCOUNTER — Ambulatory Visit (HOSPITAL_COMMUNITY)
Admission: RE | Admit: 2013-09-30 | Discharge: 2013-09-30 | Disposition: A | Discharge status: Home / Self Care | Payer: Medicare HMO | Source: Ambulatory Visit | Attending: Internal Medicine | Admitting: Internal Medicine

## 2013-09-30 ENCOUNTER — Encounter (INDEPENDENT_AMBULATORY_CARE_PROVIDER_SITE_OTHER): Payer: Self-pay | Admitting: *Deleted

## 2013-09-30 ENCOUNTER — Ambulatory Visit (INDEPENDENT_AMBULATORY_CARE_PROVIDER_SITE_OTHER): Payer: Medicare HMO | Admitting: Internal Medicine

## 2013-09-30 VITALS — BP 130/74 | HR 68 | Temp 97.5°F | Resp 18 | Ht 65.5 in | Wt 229.5 lb

## 2013-09-30 DIAGNOSIS — R0609 Other forms of dyspnea: Secondary | ICD-10-CM

## 2013-09-30 DIAGNOSIS — K7460 Unspecified cirrhosis of liver: Secondary | ICD-10-CM

## 2013-09-30 DIAGNOSIS — R5383 Other fatigue: Secondary | ICD-10-CM

## 2013-09-30 DIAGNOSIS — D62 Acute posthemorrhagic anemia: Secondary | ICD-10-CM

## 2013-09-30 DIAGNOSIS — R0602 Shortness of breath: Secondary | ICD-10-CM | POA: Diagnosis not present

## 2013-09-30 DIAGNOSIS — R5381 Other malaise: Secondary | ICD-10-CM

## 2013-09-30 DIAGNOSIS — R0989 Other specified symptoms and signs involving the circulatory and respiratory systems: Secondary | ICD-10-CM

## 2013-09-30 DIAGNOSIS — R531 Weakness: Secondary | ICD-10-CM

## 2013-09-30 DIAGNOSIS — B1920 Unspecified viral hepatitis C without hepatic coma: Secondary | ICD-10-CM

## 2013-09-30 NOTE — Patient Instructions (Signed)
Physician Will call with results of chest film and ECHO

## 2013-09-30 NOTE — Progress Notes (Signed)
Presenting complaint;  Followup for GI bleed, hepatitis C and cirrhosis.  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 She was admitted with Acute GI bleed on 09/09/2013 and required 3 units of PRBCs. No lesion was identified on EGD, endoscopy and small bowel given capsule study. She had mild portal gastropathy. Predischarge H&H was 8.4 and 24.6.  Subjective:  Patient presents for scheduled visit. She had blood work last week reviewed under lab data. Patient has not passed any more blood in her ileostomy. She continues to complain of extreme weakness and exertional dyspnea. She called office on 09/25/2013 complaining of weakness and exertional dyspnea. She was advised to go to ER for evaluation but she decided not to. She does not feel any worse. She has no difficulty breathing when she is sitting in a chair. She had single episode of dyspnea lying flat. She also complains of fullness in her upper abdomen. She denies chest pain or palpitation. She has noted mild lightheadedness at times. She denies lower extremity edema. She passes scant amount of mucus per rectum which is pink and it has been like this since she had ileostomy several years ago to several years ago. She has noted some dry cough she denies fever dysuria or hematuria.   Current Medications: Outpatient Encounter Prescriptions as of 09/30/2013  Medication Sig  . acetaminophen (TYLENOL) 500 MG tablet Take 1,000 mg by mouth 2 (two) times daily as needed for mild pain.   Marland Kitchen ALPRAZolam (XANAX) 1 MG tablet Take 1 tablet (1 mg total) by mouth at bedtime as needed for sleep.  Marland Kitchen gabapentin (NEURONTIN) 100 MG capsule Take 1 capsule (100 mg total) by mouth 3 (three) times daily.  . Ledipasvir-Sofosbuvir (HARVONI PO)  Take by mouth.  . Multiple Vitamins-Minerals (MULTIVITAMIN WITH MINERALS) tablet Take 1 tablet by mouth daily.  . nadolol (CORGARD) 20 MG tablet Take 1 tablet (20 mg total) by mouth daily.  . pantoprazole (PROTONIX) 40 MG tablet Take 1 tablet (40 mg total) by mouth daily.  . traMADol (ULTRAM) 50 MG tablet Take 1 tablet (50 mg total) by mouth 2 (two) times daily as needed for moderate pain.    Objective: Blood pressure 130/74, pulse 68, temperature 97.5 F (36.4 C), temperature source Oral, resp. rate 18, height 5' 5.5" (1.664 m), weight 229 lb 8 oz (104.101 kg). Patient is alert and does not have asterixis. Conjunctiva is pink. Sclera is nonicteric Oropharyngeal mucosa is normal. No neck masses or thyromegaly noted. Cardiac exam with regular rhythm normal S1 and S2. No murmur or gallop noted. Lungs are clear to auscultation. Abdomen is full with greenish stool in ileostomy bag. She has large parastomal hernia around ileostomy in the right mid abdomen. Spleen is palpable. No tenderness noted. No LE edema or clubbing noted.  Labs/studies Results: H&H 9.5 and 28.7 on 09/26/2013  H&H was 8.2 and 25.1 on 09/19/2013   Assessment:  #1. GI bleed from small bowel source not identified on EGD, ileoscopy and small bowel given capsule study. No evidence of recurrent bleed. H&H is slowly coming up. #2. Weakness and exertional dyspnea. She appears to be euvolemic. H&H is coming up but her symptoms are not improving.Some of her weakness may be secondary to antiviral therapy. Should rule out pulmonary or cardiac disease. #3. Hepatitis C with  cirrhosis. She has two more weeks on Harvoni. #4. History of Crohn's disease. She remission in remission.   Plan: [place] with iron 2 tablets daily. Chest films; PA and left lateral. Echocardiography. CBC and metabolic 7 next week. She will need HCVRNA by PCR at EOT. Office visit in 6 weeks.

## 2013-10-01 ENCOUNTER — Ambulatory Visit (HOSPITAL_COMMUNITY)
Admission: RE | Admit: 2013-10-01 | Discharge: 2013-10-01 | Disposition: A | Discharge status: Home / Self Care | Payer: Medicare HMO | Source: Ambulatory Visit | Attending: Internal Medicine | Admitting: Internal Medicine

## 2013-10-01 DIAGNOSIS — I10 Essential (primary) hypertension: Secondary | ICD-10-CM | POA: Insufficient documentation

## 2013-10-01 DIAGNOSIS — D649 Anemia, unspecified: Secondary | ICD-10-CM | POA: Insufficient documentation

## 2013-10-01 DIAGNOSIS — R0609 Other forms of dyspnea: Secondary | ICD-10-CM | POA: Insufficient documentation

## 2013-10-01 DIAGNOSIS — R0989 Other specified symptoms and signs involving the circulatory and respiratory systems: Principal | ICD-10-CM | POA: Insufficient documentation

## 2013-10-01 NOTE — Progress Notes (Signed)
*  PRELIMINARY RESULTS* Echocardiogram 2D Echocardiogram has been performed.  Kathryn [name] 10/01/2013, 3:50 PM

## 2013-10-03 ENCOUNTER — Ambulatory Visit: Payer: Medicare HMO | Admitting: Family

## 2013-10-04 ENCOUNTER — Ambulatory Visit: Payer: Medicare HMO | Admitting: Family

## 2013-10-10 ENCOUNTER — Encounter: Payer: Self-pay | Admitting: Nurse Practitioner

## 2013-10-10 ENCOUNTER — Ambulatory Visit (INDEPENDENT_AMBULATORY_CARE_PROVIDER_SITE_OTHER): Payer: Medicare HMO | Admitting: Nurse Practitioner

## 2013-10-10 VITALS — BP 138/88 | HR 76 | Temp 99.2°F | Ht 65.5 in | Wt 231.8 lb

## 2013-10-10 DIAGNOSIS — N3000 Acute cystitis without hematuria: Secondary | ICD-10-CM

## 2013-10-10 DIAGNOSIS — R300 Dysuria: Secondary | ICD-10-CM

## 2013-10-10 LAB — POCT UA - MICROSCOPIC ONLY
CRYSTALS, UR, HPF, POC: NEGATIVE
Casts, Ur, LPF, POC: NEGATIVE
MUCUS UA: NEGATIVE
Yeast, UA: NEGATIVE

## 2013-10-10 LAB — POCT URINALYSIS DIPSTICK
BILIRUBIN UA: NEGATIVE
Glucose, UA: NEGATIVE
KETONES UA: NEGATIVE
Nitrite, UA: NEGATIVE
PH UA: 6
Protein, UA: NEGATIVE
Urobilinogen, UA: NEGATIVE

## 2013-10-10 MED ORDER — CIPROFLOXACIN HCL 500 MG PO TABS: 500.0000 mg | ORAL_TABLET | Freq: Two times a day (BID) | ORAL | Status: DC

## 2013-10-10 NOTE — Progress Notes (Signed)
   Subjective:    Patient ID: Kathryn [name], female    DOB: [birth year], 58 y.o.   MRN: [id number]  HPI Patient in c/o dysuria, frequency and urgency- started several days ago- had nocturia last night and incontience episode yesterday.    Review of Systems  Constitutional: Positive for fever (low grade at night).  Genitourinary: Positive for dysuria, urgency, frequency and flank pain.       Nocturia  Neurological: Negative.   Psychiatric/Behavioral: Negative.   All other systems reviewed and are negative.      Objective:   Physical Exam  Constitutional: She is oriented to person, place, and time. She appears well-developed and well-nourished.  Cardiovascular: Normal rate, regular rhythm and normal heart sounds.   Pulmonary/Chest: Effort normal and breath sounds normal.  Neurological: She is alert and oriented to person, place, and time.  Skin: Skin is warm and dry.  Psychiatric: She has a normal mood and affect. Her behavior is normal. Judgment and thought content normal.    BP 138/88  Pulse 76  Temp(Src) 99.2 F (37.3 C) (Oral)  Ht 5' 5.5" (1.664 m)  Wt 231 lb 12.8 oz (105.144 kg)  BMI 37.97 kg/m2        Assessment & Plan:   1. Dysuria   2. Acute cystitis without hematuria    Meds ordered this encounter  Medications  . ciprofloxacin (CIPRO) 500 MG tablet    Sig: Take 1 tablet (500 mg total) by mouth 2 (two) times daily.    Dispense:  6 tablet    Refill:  0    Order Specific Question:  Supervising Provider    Answer:  Ernestina Penna [[id number]]   Force fluids AZO over the counter X2 days RTO prn Culture pending  Mary-Margaret Daphine Deutscher, FNP

## 2013-10-10 NOTE — Patient Instructions (Signed)

## 2013-10-11 LAB — BASIC METABOLIC PANEL
BUN: 16 mg/dL (ref 6–23)
CALCIUM: 9.1 mg/dL (ref 8.4–10.5)
CO2: 26 mEq/L (ref 19–32)
Chloride: 104 mEq/L (ref 96–112)
Creat: 1.26 mg/dL — ABNORMAL HIGH (ref 0.50–1.10)
GLUCOSE: 89 mg/dL (ref 70–99)
POTASSIUM: 4.3 meq/L (ref 3.5–5.3)
Sodium: 134 mEq/L — ABNORMAL LOW (ref 135–145)

## 2013-10-11 LAB — CBC
HCT: 28.1 % — ABNORMAL LOW (ref 36.0–46.0)
HEMOGLOBIN: 9.2 g/dL — AB (ref 12.0–15.0)
MCH: 26.4 pg (ref 26.0–34.0)
MCHC: 32.7 g/dL (ref 30.0–36.0)
MCV: 80.5 fL (ref 78.0–100.0)
Platelets: 112 10*3/uL — ABNORMAL LOW (ref 150–400)
RBC: 3.49 MIL/uL — AB (ref 3.87–5.11)
RDW: 14.6 % (ref 11.5–15.5)
WBC: 4.2 10*3/uL (ref 4.0–10.5)

## 2013-10-15 ENCOUNTER — Encounter (INDEPENDENT_AMBULATORY_CARE_PROVIDER_SITE_OTHER): Payer: Self-pay | Admitting: *Deleted

## 2013-10-15 ENCOUNTER — Telehealth (INDEPENDENT_AMBULATORY_CARE_PROVIDER_SITE_OTHER): Payer: Self-pay | Admitting: *Deleted

## 2013-10-15 DIAGNOSIS — D508 Other iron deficiency anemias: Secondary | ICD-10-CM

## 2013-10-15 NOTE — Telephone Encounter (Signed)
Per Dr.Rehman the patient will need to have labs drawn on  10/24/13.

## 2013-10-16 ENCOUNTER — Other Ambulatory Visit: Payer: Self-pay | Admitting: Family

## 2013-10-17 ENCOUNTER — Encounter (INDEPENDENT_AMBULATORY_CARE_PROVIDER_SITE_OTHER): Payer: Self-pay | Admitting: Internal Medicine

## 2013-10-17 ENCOUNTER — Ambulatory Visit (INDEPENDENT_AMBULATORY_CARE_PROVIDER_SITE_OTHER): Payer: Medicare HMO | Admitting: Internal Medicine

## 2013-10-17 VITALS — BP 134/72 | HR 72 | Temp 98.0°F | Ht 65.5 in | Wt 227.8 lb

## 2013-10-17 DIAGNOSIS — K219 Gastro-esophageal reflux disease without esophagitis: Secondary | ICD-10-CM

## 2013-10-17 DIAGNOSIS — K7460 Unspecified cirrhosis of liver: Secondary | ICD-10-CM

## 2013-10-17 DIAGNOSIS — K922 Gastrointestinal hemorrhage, unspecified: Secondary | ICD-10-CM

## 2013-10-17 MED ORDER — PANTOPRAZOLE SODIUM 40 MG PO TBEC: 40.0000 mg | DELAYED_RELEASE_TABLET | Freq: Every day | ORAL | Status: DC

## 2013-10-17 MED ORDER — NADOLOL 20 MG PO TABS: 20.0000 mg | ORAL_TABLET | Freq: Every day | ORAL | Status: DC

## 2013-10-17 NOTE — Telephone Encounter (Signed)
Patient last seen in office on 10-10-13. Last refill was on 8-10 for #30. Please advise.  If approved please route to pool B so nurse can phone pt to pick up. Rx will print

## 2013-10-17 NOTE — Telephone Encounter (Signed)
I hav eonly seen this patient one time- Kathryn [name] gave her ultram in the past- what is she taking for?

## 2013-10-17 NOTE — Telephone Encounter (Signed)
Pharmacy told pt she would need to be seen for refills.

## 2013-10-17 NOTE — Patient Instructions (Signed)
OV in 6 weeks with a CBC, CMET, Hep C quaint.

## 2013-10-17 NOTE — Progress Notes (Addendum)
Subjective:    Patient ID: Kathryn [name], female    DOB: February 07, 1956, 58 y.o.   MRN: [id number]  HPI Here today for f/o of her Crohn's. Hx of Crohn's disease status post remote ileostomuy with excluded sigmoid colon and rectum.   Marland Kitchen Hx of Hepatitis C.  She is genotype 1A. Started the [facility] 07/09/2013. Her viral load at week 4 was undetectable. She has a few more days on her Harvoni and she will be finished. She had a lag in her treatment due to the shipment of the medication.  She has some SOB but does feel better.  Recently finished Cipro for a UTI. Appetite is good. She is eating a healthier diet and is trying to lose weight. She has lost 1 1/2 pounds since her last office visit. She is emptying her ileostomy about 5 times a day. No BRRB or melena. 11/26/2012: Liver biopsy; Chronic Hepatitis. Viral Type C. Mildly active. Less than 5% hepatic steatosis present.   Admitted to [facility] with acute GI bleed 09/09/2013 and required 3 units of PRBCs. No lesion was identified on EGD, endoscopy and small bowel given capsule study.  She has mild portal gastropathy.    10/15/2013: HCVRNA: not detected. CMP     Component Value Date/Time   NA 131* 10/15/2013 1432   NA 137 07/04/2013 0908   K 4.2 10/15/2013 1432   CL 101 10/15/2013 1432   CO2 24 10/15/2013 1432   GLUCOSE 107* 10/15/2013 1432   GLUCOSE 103* 07/04/2013 0908   BUN 17 10/15/2013 1432   BUN 14 07/04/2013 0908   CREATININE 1.35* 10/15/2013 1432   CREATININE 1.10 09/13/2013 0601   CALCIUM 9.0 10/15/2013 1432   PROT 6.6 10/15/2013 1432   PROT 6.1 07/04/2013 0908   ALBUMIN 3.8 10/15/2013 1432   AST 23 10/15/2013 1432   ALT 9 10/15/2013 1432   ALKPHOS 103 10/15/2013 1432   BILITOT 0.6 10/15/2013 1432   GFRNONAA 55* 09/13/2013 0601   GFRAA 63* 09/13/2013 0601      CBC    Component Value Date/Time   WBC 4.3 10/15/2013 1432   RBC 3.82* 10/15/2013 1432   HGB 10.1* 10/15/2013 1432   HCT 31.1* 10/15/2013 1432   PLT 142* 10/15/2013 1432   MCV 81.4 10/15/2013 1432   MCH 26.4  10/15/2013 1432   MCHC 32.5 10/15/2013 1432   RDW 14.7 10/15/2013 1432   LYMPHSABS 0.6* 08/21/2013 1036   MONOABS 0.3 08/21/2013 1036   EOSABS 0.2 08/21/2013 1036   BASOSABS 0.0 08/21/2013 1036       CBC Latest Ref Rng 10/10/2013 09/26/2013 09/19/2013  WBC 4.0 - 10.5 K/uL 4.2 - -  Hemoglobin 12.0 - 15.0 g/dL 2.9(B) 2.8(U) 1.3(K)  Hematocrit 36.0 - 46.0 % 28.1(L) 28.7(L) 25.1(L)  Platelets 150 - 400 K/uL 112(L) - -    BMET    Component Value Date/Time   NA 134* 10/10/2013 1625   NA 137 07/04/2013 0908   K 4.3 10/10/2013 1625   CL 104 10/10/2013 1625   CO2 26 10/10/2013 1625   GLUCOSE 89 10/10/2013 1625   GLUCOSE 103* 07/04/2013 0908   BUN 16 10/10/2013 1625   BUN 14 07/04/2013 0908   CREATININE 1.26* 10/10/2013 1625   CREATININE 1.10 09/13/2013 0601   CALCIUM 9.1 10/10/2013 1625   GFRNONAA 55* 09/13/2013 0601   GFRAA 63* 09/13/2013 0601      Echo 10/05/2013 SOB: She does not have LV dysfunction;  09/12/2013 MRI Abdomen w/wo contrast: IMPRESSION:  1. Morphologic features of the liver compatible with cirrhosis.  2. Stigmata of portal venous hypertension with marked splenomegaly.  8/r/2015 US abdomen; IMPRESSION:  No acute abnormality. Chronic splenomegaly. Chronic nodularity and  coarse echogenicity of the liver parenchyma consistent with the  history of chronic liver disease.   Review of Systems Past Medical History  Diagnosis Date  . Hypertension   . Enteritis presumed infectious   . Crohn's   . Crohn's   . Anemia   . Hepatitis C antibody test positive   . Hepatitis     Past Surgical History  Procedure Laterality Date  . Colon surgery      MULTIPLE SURGERIES FOR CROHNS  . Ileostomy      multiple abdominal surgeries for Crohn's by Dr. Gabriel Cirri  . Cholecystectomy    . Abscess drainage      abdominal  . Esophagogastroduodenoscopy  10/05/2010    Procedure: ESOPHAGOGASTRODUODENOSCOPY (EGD);  Surgeon: [name], MD;  Location: [facility];  Service: Endoscopy;  Laterality: N/A;  7:30  am  . Cirrhosis    . Esophagogastroduodenoscopy N/A 09/11/2013    Procedure: ESOPHAGOGASTRODUODENOSCOPY (EGD);  Surgeon: [name], MD;  Location: [facility];  Service: Endoscopy;  Laterality: N/A;  . Ileoscopy  09/11/2013    Procedure: ILEOSCOPY THROUGH STOMA;  Surgeon: [name], MD;  Location: [facility];  Service: Endoscopy;;  . Givens capsule study N/A 09/12/2013    Procedure: GIVENS CAPSULE STUDY;  Surgeon: [name], MD;  Location: [facility];  Service: Endoscopy;  Laterality: N/A;    Allergies  Allergen Reactions  . Penicillins Rash    Current Outpatient Prescriptions on File Prior to Visit  Medication Sig Dispense Refill  . acetaminophen (TYLENOL) 500 MG tablet Take 1,000 mg by mouth 2 (two) times daily as needed for mild pain.       Marland Kitchen ALPRAZolam (XANAX) 1 MG tablet Take 1 tablet (1 mg total) by mouth at bedtime as needed for sleep.  30 tablet  1  . gabapentin (NEURONTIN) 100 MG capsule Take 1 capsule (100 mg total) by mouth 3 (three) times daily.  270 capsule  0  . Ledipasvir-Sofosbuvir (HARVONI PO) Take by mouth.      . Multiple Vitamins-Minerals (MULTIVITAMIN WITH MINERALS) tablet Take 1 tablet by mouth daily.      . traMADol (ULTRAM) 50 MG tablet Take 1 tablet (50 mg total) by mouth 2 (two) times daily as needed for moderate pain.  30 tablet  1   No current facility-administered medications on file prior to visit.        Objective:   Physical Exam  Filed Vitals:   10/17/13 1359  BP: 134/72  Pulse: 72  Temp: 98 F (36.7 C)  Height: 5' 5.5" (1.664 m)  Weight: 227 lb 12.8 oz (103.329 kg)   Alert and oriented. Skin warm and dry. Oral mucosa is moist.   . Sclera anicteric, conjunctivae is pink. Thyroid not enlarged. No cervical lymphadenopathy. Lungs clear. Heart regular rate and rhythm.  Abdomen is soft. Bowel sounds are positive. No hepatomegaly. No abdominal masses felt. No tenderness.  No edema to lower extremities.   Ileostomy in place. Stool  brown in color.    Assessment & Plan:  Hepatitis C. She has cleared the virus. Labs for today Cmet and Hep C Quaint.  GI: She has not bleed. Slight drop in hemoglobin. 9.5 to 9.2. Will repeat CBC. Crohn's disease; no abdominal pain. She is not  maintained on any medication.

## 2013-10-18 ENCOUNTER — Other Ambulatory Visit (INDEPENDENT_AMBULATORY_CARE_PROVIDER_SITE_OTHER): Payer: Self-pay | Admitting: Internal Medicine

## 2013-10-18 ENCOUNTER — Telehealth (INDEPENDENT_AMBULATORY_CARE_PROVIDER_SITE_OTHER): Payer: Self-pay | Admitting: *Deleted

## 2013-10-18 DIAGNOSIS — K5090 Crohn's disease, unspecified, without complications: Secondary | ICD-10-CM

## 2013-10-18 DIAGNOSIS — B182 Chronic viral hepatitis C: Secondary | ICD-10-CM

## 2013-10-18 LAB — COMPREHENSIVE METABOLIC PANEL
ALK PHOS: 103 U/L (ref 39–117)
ALT: 9 U/L (ref 0–35)
AST: 23 U/L (ref 0–37)
Albumin: 3.8 g/dL (ref 3.5–5.2)
BILIRUBIN TOTAL: 0.6 mg/dL (ref 0.2–1.2)
BUN: 17 mg/dL (ref 6–23)
CO2: 24 meq/L (ref 19–32)
CREATININE: 1.35 mg/dL — AB (ref 0.50–1.10)
Calcium: 9 mg/dL (ref 8.4–10.5)
Chloride: 101 mEq/L (ref 96–112)
Glucose, Bld: 107 mg/dL — ABNORMAL HIGH (ref 70–99)
Potassium: 4.2 mEq/L (ref 3.5–5.3)
SODIUM: 131 meq/L — AB (ref 135–145)
TOTAL PROTEIN: 6.6 g/dL (ref 6.0–8.3)

## 2013-10-18 LAB — CBC
HCT: 31.1 % — ABNORMAL LOW (ref 36.0–46.0)
Hemoglobin: 10.1 g/dL — ABNORMAL LOW (ref 12.0–15.0)
MCH: 26.4 pg (ref 26.0–34.0)
MCHC: 32.5 g/dL (ref 30.0–36.0)
MCV: 81.4 fL (ref 78.0–100.0)
PLATELETS: 142 10*3/uL — AB (ref 150–400)
RBC: 3.82 MIL/uL — ABNORMAL LOW (ref 3.87–5.11)
RDW: 14.7 % (ref 11.5–15.5)
WBC: 4.3 10*3/uL (ref 4.0–10.5)

## 2013-10-18 LAB — HEPATITIS C RNA QUANTITATIVE: HCV QUANT: NOT DETECTED [IU]/mL (ref <15)

## 2013-10-18 NOTE — Telephone Encounter (Signed)
.  Per Terri Setzer,NP patient to have labs drawn in 6 weeks. 

## 2013-10-21 ENCOUNTER — Telehealth (INDEPENDENT_AMBULATORY_CARE_PROVIDER_SITE_OTHER): Payer: Self-pay | Admitting: *Deleted

## 2013-10-21 DIAGNOSIS — B182 Chronic viral hepatitis C: Secondary | ICD-10-CM

## 2013-10-21 DIAGNOSIS — D509 Iron deficiency anemia, unspecified: Secondary | ICD-10-CM

## 2013-10-21 NOTE — Telephone Encounter (Signed)
Per Dr.Rehman the patient will need to have labs drawn on 11-26-13, 2 days prior to her appointment with Terri.

## 2013-10-23 ENCOUNTER — Telehealth: Payer: Self-pay | Admitting: Family

## 2013-10-23 MED ORDER — TRAMADOL HCL 50 MG PO TABS: 50.0000 mg | ORAL_TABLET | Freq: Two times a day (BID) | ORAL | Status: DC | PRN

## 2013-10-30 ENCOUNTER — Other Ambulatory Visit (INDEPENDENT_AMBULATORY_CARE_PROVIDER_SITE_OTHER): Payer: Self-pay | Admitting: *Deleted

## 2013-10-30 ENCOUNTER — Encounter (INDEPENDENT_AMBULATORY_CARE_PROVIDER_SITE_OTHER): Payer: Self-pay | Admitting: *Deleted

## 2013-10-30 DIAGNOSIS — D509 Iron deficiency anemia, unspecified: Secondary | ICD-10-CM

## 2013-10-30 DIAGNOSIS — K5090 Crohn's disease, unspecified, without complications: Secondary | ICD-10-CM

## 2013-10-30 DIAGNOSIS — B182 Chronic viral hepatitis C: Secondary | ICD-10-CM

## 2013-11-07 HISTORY — PX: TIPS PROCEDURE: SHX808

## 2013-11-21 ENCOUNTER — Encounter: Payer: Self-pay | Admitting: Family

## 2013-11-21 ENCOUNTER — Ambulatory Visit (INDEPENDENT_AMBULATORY_CARE_PROVIDER_SITE_OTHER): Payer: Medicare HMO | Admitting: Family

## 2013-11-21 VITALS — BP 144/81 | HR 91 | Temp 97.1°F | Ht 65.5 in | Wt 232.8 lb

## 2013-11-21 DIAGNOSIS — IMO0001 Reserved for inherently not codable concepts without codable children: Secondary | ICD-10-CM

## 2013-11-21 DIAGNOSIS — B3731 Acute candidiasis of vulva and vagina: Secondary | ICD-10-CM

## 2013-11-21 DIAGNOSIS — B373 Candidiasis of vulva and vagina: Secondary | ICD-10-CM

## 2013-11-21 DIAGNOSIS — R350 Frequency of micturition: Secondary | ICD-10-CM

## 2013-11-21 LAB — POCT URINALYSIS DIPSTICK
Bilirubin, UA: NEGATIVE
Glucose, UA: NEGATIVE
Ketones, UA: NEGATIVE
Nitrite, UA: NEGATIVE
Protein, UA: NEGATIVE
UROBILINOGEN UA: NEGATIVE
pH, UA: 6

## 2013-11-21 LAB — POCT UA - MICROSCOPIC ONLY
BACTERIA, U MICROSCOPIC: NEGATIVE
CRYSTALS, UR, HPF, POC: NEGATIVE
Casts, Ur, LPF, POC: NEGATIVE
Mucus, UA: NEGATIVE

## 2013-11-21 MED ORDER — FLUCONAZOLE 150 MG PO TABS: 150.0000 mg | ORAL_TABLET | Freq: Once | ORAL | Status: DC

## 2013-11-21 NOTE — Patient Instructions (Signed)

## 2013-11-21 NOTE — Progress Notes (Signed)
   Subjective:    Patient ID: Kathryn [name], female    DOB: 1955-09-05, 58 y.o.   MRN: [id number]  Urinary Tract Infection  This is a new problem. The current episode started in the past 7 days. The problem occurs intermittently. The problem has been waxing and waning. The quality of the pain is described as burning. The pain is at a severity of 4/10. The pain is mild. Associated symptoms include chills, a discharge, flank pain, frequency, hesitancy, nausea and urgency. Pertinent negatives include no hematuria or vomiting. She has tried nothing for the symptoms. The treatment provided no relief.  Fever  Associated symptoms include nausea. Pertinent negatives include no headaches or vomiting.      Review of Systems  Constitutional: Positive for fever and chills.  HENT: Negative.   Eyes: Negative.   Respiratory: Negative.  Negative for shortness of breath.   Cardiovascular: Negative.  Negative for palpitations.  Gastrointestinal: Positive for nausea. Negative for vomiting.  Endocrine: Negative.   Genitourinary: Positive for hesitancy, urgency, frequency and flank pain. Negative for hematuria.  Musculoskeletal: Negative.   Neurological: Negative.  Negative for headaches.  Hematological: Negative.   Psychiatric/Behavioral: Negative.   All other systems reviewed and are negative.      Objective:   Physical Exam  Vitals reviewed. Constitutional: She is oriented to person, place, and time. She appears well-developed and well-nourished. No distress.  Eyes: Pupils are equal, round, and reactive to light.  Neck: Normal range of motion. Neck supple. No thyromegaly present.  Cardiovascular: Normal rate, regular rhythm, normal heart sounds and intact distal pulses.   No murmur heard. Pulmonary/Chest: Effort normal and breath sounds normal. No respiratory distress. She has no wheezes.  Abdominal: Soft. Bowel sounds are normal. She exhibits no distension. There is no tenderness.    Musculoskeletal: Normal range of motion. She exhibits no edema and no tenderness.  Negative for CVA tenderness   Neurological: She is alert and oriented to person, place, and time. She has normal reflexes. No cranial nerve deficit.  Skin: Skin is warm and dry.  Psychiatric: She has a normal mood and affect. Her behavior is normal. Judgment and thought content normal.      BP 144/81  Pulse 91  Temp(Src) 97.1 F (36.2 C) (Oral)  Ht 5' 5.5" (1.664 m)  Wt 232 lb 12.8 oz (105.597 kg)  BMI 38.14 kg/m2     Assessment & Plan:  1. Frequency - POCT urinalysis dipstick - POCT UA - Microscopic Only - Urine culture  2. Vaginal yeast infection -Keep area clean and dry -Cotton underwear -RTO prn - fluconazole (DIFLUCAN) 150 MG tablet; Take 1 tablet (150 mg total) by mouth once.  Dispense: 1 tablet; Refill: 1  [name], FNP

## 2013-11-23 LAB — URINE CULTURE

## 2013-11-25 ENCOUNTER — Inpatient Hospital Stay (HOSPITAL_COMMUNITY)
Admission: EM | Admit: 2013-11-25 | Discharge: 2013-11-27 | DRG: 378 | Disposition: A | Discharge status: Home / Self Care | Payer: Medicare HMO | Attending: Internal Medicine | Admitting: Internal Medicine

## 2013-11-25 ENCOUNTER — Encounter (HOSPITAL_COMMUNITY): Payer: Self-pay | Admitting: Emergency Medicine

## 2013-11-25 ENCOUNTER — Encounter (HOSPITAL_COMMUNITY)
Admission: EM | Disposition: A | Discharge status: Home / Self Care | Payer: Self-pay | Source: Home / Self Care | Attending: Family Medicine

## 2013-11-25 ENCOUNTER — Emergency Department (HOSPITAL_COMMUNITY): Payer: Medicare HMO

## 2013-11-25 DIAGNOSIS — B182 Chronic viral hepatitis C: Secondary | ICD-10-CM | POA: Diagnosis present

## 2013-11-25 DIAGNOSIS — I129 Hypertensive chronic kidney disease with stage 1 through stage 4 chronic kidney disease, or unspecified chronic kidney disease: Secondary | ICD-10-CM | POA: Diagnosis present

## 2013-11-25 DIAGNOSIS — G629 Polyneuropathy, unspecified: Secondary | ICD-10-CM

## 2013-11-25 DIAGNOSIS — Z823 Family history of stroke: Secondary | ICD-10-CM

## 2013-11-25 DIAGNOSIS — D696 Thrombocytopenia, unspecified: Secondary | ICD-10-CM | POA: Diagnosis present

## 2013-11-25 DIAGNOSIS — K7460 Unspecified cirrhosis of liver: Secondary | ICD-10-CM | POA: Diagnosis present

## 2013-11-25 DIAGNOSIS — D631 Anemia in chronic kidney disease: Secondary | ICD-10-CM

## 2013-11-25 DIAGNOSIS — N183 Chronic kidney disease, stage 3 unspecified: Secondary | ICD-10-CM

## 2013-11-25 DIAGNOSIS — D62 Acute posthemorrhagic anemia: Secondary | ICD-10-CM

## 2013-11-25 DIAGNOSIS — D638 Anemia in other chronic diseases classified elsewhere: Secondary | ICD-10-CM | POA: Diagnosis present

## 2013-11-25 DIAGNOSIS — K5090 Crohn's disease, unspecified, without complications: Secondary | ICD-10-CM | POA: Diagnosis present

## 2013-11-25 DIAGNOSIS — K922 Gastrointestinal hemorrhage, unspecified: Secondary | ICD-10-CM | POA: Diagnosis not present

## 2013-11-25 DIAGNOSIS — R768 Other specified abnormal immunological findings in serum: Secondary | ICD-10-CM | POA: Diagnosis present

## 2013-11-25 DIAGNOSIS — K50911 Crohn's disease, unspecified, with rectal bleeding: Secondary | ICD-10-CM

## 2013-11-25 DIAGNOSIS — I5030 Unspecified diastolic (congestive) heart failure: Secondary | ICD-10-CM

## 2013-11-25 DIAGNOSIS — Z809 Family history of malignant neoplasm, unspecified: Secondary | ICD-10-CM

## 2013-11-25 DIAGNOSIS — K5000 Crohn's disease of small intestine without complications: Secondary | ICD-10-CM | POA: Diagnosis present

## 2013-11-25 DIAGNOSIS — D649 Anemia, unspecified: Secondary | ICD-10-CM

## 2013-11-25 DIAGNOSIS — Z933 Colostomy status: Secondary | ICD-10-CM

## 2013-11-25 DIAGNOSIS — K7469 Other cirrhosis of liver: Secondary | ICD-10-CM

## 2013-11-25 DIAGNOSIS — Z9049 Acquired absence of other specified parts of digestive tract: Secondary | ICD-10-CM | POA: Diagnosis present

## 2013-11-25 HISTORY — DX: Splenomegaly, not elsewhere classified: R16.1

## 2013-11-25 HISTORY — DX: Unspecified cirrhosis of liver: K74.60

## 2013-11-25 HISTORY — DX: Disorder of kidney and ureter, unspecified: N28.9

## 2013-11-25 HISTORY — DX: Chronic kidney disease, stage 3 unspecified: N18.30

## 2013-11-25 HISTORY — PX: GIVENS CAPSULE STUDY: SHX5432

## 2013-11-25 HISTORY — DX: Anemia in chronic kidney disease: D63.1

## 2013-11-25 LAB — COMPREHENSIVE METABOLIC PANEL
ALT: 12 U/L (ref 0–35)
AST: 23 U/L (ref 0–37)
Albumin: 3.1 g/dL — ABNORMAL LOW (ref 3.5–5.2)
Alkaline Phosphatase: 146 U/L — ABNORMAL HIGH (ref 39–117)
Anion gap: 12 (ref 5–15)
BUN: 15 mg/dL (ref 6–23)
CO2: 21 mEq/L (ref 19–32)
Calcium: 9.1 mg/dL (ref 8.4–10.5)
Chloride: 104 mEq/L (ref 96–112)
Creatinine, Ser: 1.3 mg/dL — ABNORMAL HIGH (ref 0.50–1.10)
GFR calc Af Amer: 52 mL/min — ABNORMAL LOW (ref >90)
GFR calc non Af Amer: 45 mL/min — ABNORMAL LOW (ref >90)
Glucose, Bld: 115 mg/dL — ABNORMAL HIGH (ref 70–99)
Potassium: 4.4 mEq/L (ref 3.7–5.3)
Sodium: 137 mEq/L (ref 137–147)
Total Bilirubin: 0.5 mg/dL (ref 0.3–1.2)
Total Protein: 6.8 g/dL (ref 6.0–8.3)

## 2013-11-25 LAB — CBC WITH DIFFERENTIAL/PLATELET
Basophils Absolute: 0 10*3/uL (ref 0.0–0.1)
Basophils Relative: 1 % (ref 0–1)
Eosinophils Absolute: 0.2 10*3/uL (ref 0.0–0.7)
Eosinophils Relative: 6 % — ABNORMAL HIGH (ref 0–5)
HCT: 29.1 % — ABNORMAL LOW (ref 36.0–46.0)
Hemoglobin: 9.5 g/dL — ABNORMAL LOW (ref 12.0–15.0)
Lymphocytes Relative: 25 % (ref 12–46)
Lymphs Abs: 1 10*3/uL (ref 0.7–4.0)
MCH: 26.5 pg (ref 26.0–34.0)
MCHC: 32.6 g/dL (ref 30.0–36.0)
MCV: 81.3 fL (ref 78.0–100.0)
Monocytes Absolute: 0.5 10*3/uL (ref 0.1–1.0)
Monocytes Relative: 12 % (ref 3–12)
Neutro Abs: 2.4 10*3/uL (ref 1.7–7.7)
Neutrophils Relative %: 58 % (ref 43–77)
Platelets: 116 10*3/uL — ABNORMAL LOW (ref 150–400)
RBC: 3.58 MIL/uL — ABNORMAL LOW (ref 3.87–5.11)
RDW: 15.7 % — ABNORMAL HIGH (ref 11.5–15.5)
WBC: 4.2 10*3/uL (ref 4.0–10.5)

## 2013-11-25 LAB — CBC
HCT: 27 % — ABNORMAL LOW (ref 36.0–46.0)
HEMATOCRIT: 25.3 % — AB (ref 36.0–46.0)
HEMOGLOBIN: 8.8 g/dL — AB (ref 12.0–15.0)
Hemoglobin: 8.2 g/dL — ABNORMAL LOW (ref 12.0–15.0)
MCH: 26.7 pg (ref 26.0–34.0)
MCH: 26.5 pg (ref 26.0–34.0)
MCHC: 32.6 g/dL (ref 30.0–36.0)
MCHC: 32.4 g/dL (ref 30.0–36.0)
MCV: 81.8 fL (ref 78.0–100.0)
MCV: 81.9 fL (ref 78.0–100.0)
PLATELETS: 101 10*3/uL — AB (ref 150–400)
Platelets: 100 10*3/uL — ABNORMAL LOW (ref 150–400)
RBC: 3.3 MIL/uL — ABNORMAL LOW (ref 3.87–5.11)
RBC: 3.09 MIL/uL — ABNORMAL LOW (ref 3.87–5.11)
RDW: 15.9 % — ABNORMAL HIGH (ref 11.5–15.5)
RDW: 15.9 % — AB (ref 11.5–15.5)
WBC: 4 10*3/uL (ref 4.0–10.5)
WBC: 3.8 10*3/uL — AB (ref 4.0–10.5)

## 2013-11-25 LAB — PROTIME-INR
INR: 1.22 (ref 0.00–1.49)
Prothrombin Time: 15.6 seconds — ABNORMAL HIGH (ref 11.6–15.2)

## 2013-11-25 LAB — MRSA PCR SCREENING: MRSA by PCR: NEGATIVE

## 2013-11-25 SURGERY — IMAGING PROCEDURE, GI TRACT, INTRALUMINAL, VIA CAPSULE

## 2013-11-25 MED ORDER — SODIUM CHLORIDE 0.9 % IV SOLN
INTRAVENOUS | Status: DC
  Administered 2013-11-25: 13:00:00 via INTRAVENOUS

## 2013-11-25 MED ORDER — SODIUM CHLORIDE 0.9 % IV BOLUS (SEPSIS)
1000.0000 mL | Freq: Once | INTRAVENOUS | Status: AC
  Administered 2013-11-25: 1000 mL via INTRAVENOUS

## 2013-11-25 MED ORDER — PANTOPRAZOLE SODIUM 40 MG PO TBEC
40.0000 mg | DELAYED_RELEASE_TABLET | Freq: Every day | ORAL | Status: DC
  Administered 2013-11-26 – 2013-11-27 (×2): 40 mg via ORAL
  Filled 2013-11-25 (×2): qty 1

## 2013-11-25 MED ORDER — SIMETHICONE 40 MG/0.6ML PO SUSP
ORAL | Status: AC
  Filled 2013-11-25: qty 0.6

## 2013-11-25 MED ORDER — TECHNETIUM TC 99M-LABELED RED BLOOD CELLS IV KIT
25.0000 | PACK | Freq: Once | INTRAVENOUS | Status: AC | PRN
  Administered 2013-11-25: 24.5 via INTRAVENOUS

## 2013-11-25 MED ORDER — ONDANSETRON HCL 4 MG/2ML IJ SOLN
4.0000 mg | Freq: Four times a day (QID) | INTRAMUSCULAR | Status: DC | PRN
Start: 2013-11-25 — End: 2013-11-27

## 2013-11-25 MED ORDER — SODIUM CHLORIDE 0.9 % IJ SOLN
INTRAMUSCULAR | Status: AC
Start: 2013-11-25 — End: 2013-11-25
  Filled 2013-11-25: qty 18

## 2013-11-25 MED ORDER — SODIUM CHLORIDE 0.9 % IV SOLN
INTRAVENOUS | Status: DC
  Administered 2013-11-26: 17:00:00 via INTRAVENOUS

## 2013-11-25 MED ORDER — ALPRAZOLAM 0.5 MG PO TABS
1.0000 mg | ORAL_TABLET | Freq: Every evening | ORAL | Status: DC | PRN
  Administered 2013-11-25 – 2013-11-26 (×2): 1 mg via ORAL
  Filled 2013-11-25 (×2): qty 2

## 2013-11-25 MED ORDER — GABAPENTIN 100 MG PO CAPS
100.0000 mg | ORAL_CAPSULE | Freq: Three times a day (TID) | ORAL | Status: DC
  Administered 2013-11-25 – 2013-11-27 (×6): 100 mg via ORAL
  Filled 2013-11-25 (×6): qty 1

## 2013-11-25 MED ORDER — SODIUM CHLORIDE 0.9 % IV SOLN
INTRAVENOUS | Status: DC
  Administered 2013-11-26 – 2013-11-27 (×2): via INTRAVENOUS

## 2013-11-25 MED ORDER — HEPARIN SOD (PORK) LOCK FLUSH 100 UNIT/ML IV SOLN
INTRAVENOUS | Status: AC
  Administered 2013-11-25: 50 [IU] via INTRAVENOUS
  Filled 2013-11-25: qty 5

## 2013-11-25 MED ORDER — TRAMADOL HCL 50 MG PO TABS: 50.0000 mg | ORAL_TABLET | Freq: Two times a day (BID) | ORAL | Status: DC | PRN

## 2013-11-25 MED ORDER — ONDANSETRON HCL 4 MG PO TABS: 4.0000 mg | ORAL_TABLET | Freq: Four times a day (QID) | ORAL | Status: DC | PRN

## 2013-11-25 MED ORDER — SODIUM CHLORIDE 0.9 % IV SOLN
Freq: Once | INTRAVENOUS | Status: AC
  Administered 2013-11-25: 08:00:00 via INTRAVENOUS

## 2013-11-25 NOTE — ED Notes (Signed)
Report called  

## 2013-11-25 NOTE — ED Notes (Signed)
Patient denies any dizziness upon standing.  Patient did state that she felt "weak".  Notified Tammy Sours R.N. Of vital signs

## 2013-11-25 NOTE — ED Notes (Signed)
I am having a GI bleed that started this morning. I have a colostomy due to Chron's disease.

## 2013-11-25 NOTE — ED Notes (Signed)
Pt still in radiology. Will give NS bolus and take pt to the floor.

## 2013-11-25 NOTE — H&P (Signed)
History and Physical  Kathryn [name] WUJ:[id number] DOB: [birth year] DOA: 11/25/2013  Referring physician: Dr. Lars Mage in ED PCP: [name], MD   Chief Complaint: bleeding  HPI:  [id number] with h/o Crohn's disease presented to ED with h/o dark blood and clots starting this AM. Admitted for GIB.  Admitted 09/2013 for GIB requiring 2U PRBC, underwent EGD and capsule study which were unrevealing, but small bowel source presumed  Reports some upper abdominal pain over the last 2 days and general maliase. This AM had large amount of liquid in ileostomy bag (unusual for AM), dark blood and clots. Abdominal pain resolved bowel movement. No recent bleeding. No complaints now, no abdominal pain.  In the emergency department afebrile, VSS, orthostatics positive. Hgb and plts at baseline and CMP without acute findings. NM study did not reveal acute bleeding--area of blood collection suspicious for active Crohn's disease.  Review of Systems:  Negative for high fever, visual changes, sore throat, rash, new muscle aches, chest pain, SOB, dysuria.  Past Medical History  Diagnosis Date  . Hypertension   . Enteritis presumed infectious   . Crohn's   . Crohn's   . Anemia   . Hepatitis C antibody test positive   . Hepatitis   . Renal insufficiency   . Cirrhosis   . Splenomegaly     Past Surgical History  Procedure Laterality Date  . Colon surgery      MULTIPLE SURGERIES FOR CROHNS  . Ileostomy      multiple abdominal surgeries for Crohn's by Dr. Gabriel Cirri  . Cholecystectomy    . Abscess drainage      abdominal  . Esophagogastroduodenoscopy  10/05/2010    Procedure: ESOPHAGOGASTRODUODENOSCOPY (EGD);  Surgeon: [name], MD;  Location: [facility];  Service: Endoscopy;  Laterality: N/A;  7:30 am  . Cirrhosis    . Esophagogastroduodenoscopy N/A 09/11/2013    Procedure: ESOPHAGOGASTRODUODENOSCOPY (EGD);  Surgeon: [name], MD;  Location: [facility];  Service: Endoscopy;   Laterality: N/A;  . Ileoscopy  09/11/2013    Procedure: ILEOSCOPY THROUGH STOMA;  Surgeon: [name], MD;  Location: [facility];  Service: Endoscopy;;  . Givens capsule study N/A 09/12/2013    Procedure: GIVENS CAPSULE STUDY;  Surgeon: [name], MD;  Location: [facility];  Service: Endoscopy;  Laterality: N/A;    Social History:  reports that she has never smoked. She has never used smokeless tobacco. She reports that she does not drink alcohol or use illicit drugs.  Allergies  Allergen Reactions  . Penicillins Rash    Family History  Problem Relation Age of Onset  . Cancer Mother   . Stroke Father      Prior to Admission medications   Medication Sig Start Date End Date Taking? Authorizing Provider  acetaminophen (TYLENOL) 500 MG tablet Take 1,000 mg by mouth 2 (two) times daily as needed for mild pain.    Yes Historical Provider, MD  ALPRAZolam Prudy Feeler) 1 MG tablet Take 1 tablet (1 mg total) by mouth at bedtime as needed for sleep. 08/19/13  Yes Junie Spencer, FNP  gabapentin (NEURONTIN) 100 MG capsule Take 1 capsule (100 mg total) by mouth 3 (three) times daily. 08/27/13  Yes Junie Spencer, FNP  Multiple Vitamins-Minerals (MULTIVITAMIN WITH MINERALS) tablet Take 1 tablet by mouth daily.   Yes Historical Provider, MD  nadolol (CORGARD) 20 MG tablet Take 1 tablet (20 mg total) by mouth daily. 10/17/13  Yes Terri L  Setzer, NP  pantoprazole (PROTONIX) 40 MG tablet TAKE 1 TABLET BY MOUTH DAILY 10/18/13  Yes Len Blalock, NP  traMADol (ULTRAM) 50 MG tablet Take 1 tablet (50 mg total) by mouth 2 (two) times daily as needed for moderate pain. 10/23/13  Yes Junie Spencer, FNP   Physical Exam: Filed Vitals:   11/25/13 0647 11/25/13 1200 11/25/13 1300  BP: 126/86 131/86 99/66  Pulse: 73 68 61  Temp: 98.8 F (37.1 C) 97.8 F (36.6 C)   TempSrc: Oral Oral   Resp: 18 20 12   Height: 5' 5.5" (1.664 m) 5' 5.5" (1.664 m)   Weight: 105.235 kg (232 lb) 101.5 kg (223 lb 12.3  oz)   SpO2: 100% 100% 100%    General: Appears calm and comfortable Eyes: PERRL, normal lids, irises  ENT: grossly normal hearing, lips Neck: no LAD, masses or thyromegaly Cardiovascular: RRR, no m/r/g. No LE edema. Respiratory: CTA bilaterally, no w/r/r. Normal respiratory effort. Abdomen: soft, ntnd Skin: no rash or induration seen  Musculoskeletal: grossly normal tone BUE/BLE Psychiatric: grossly normal mood and affect, speech fluent and appropriate Neurologic: grossly non-focal.  Wt Readings from Last 3 Encounters:  11/25/13 101.5 kg (223 lb 12.3 oz)  11/21/13 105.597 kg (232 lb 12.8 oz)  10/17/13 103.329 kg (227 lb 12.8 oz)    Labs on Admission:  Basic Metabolic Panel:  Recent Labs Lab 11/25/13 0704  NA 137  K 4.4  CL 104  CO2 21  GLUCOSE 115*  BUN 15  CREATININE 1.30*  CALCIUM 9.1    Liver Function Tests:  Recent Labs Lab 11/25/13 0704  AST 23  ALT 12  ALKPHOS 146*  BILITOT 0.5  PROT 6.8  ALBUMIN 3.1*    CBC:  Recent Labs Lab 11/25/13 0704  WBC 4.2  NEUTROABS 2.4  HGB 9.5*  HCT 29.1*  MCV 81.3  PLT 116*    Radiological Exams on Admission: Nm Gi Blood Loss  11/25/2013   CLINICAL DATA:  Crohn's disease, post colectomy and ileostomy, who abdominal patent and low grade fever for 2 days, blood in ileostomy bag this morning  EXAM: NUCLEAR MEDICINE GASTROINTESTINAL BLEEDING SCAN  TECHNIQUE: Sequential abdominal images were obtained following intravenous administration of Tc-23m labeled red blood cells. Images were obtained for 2 hr. A delayed static images obtained with the lead marker over the ileostomy.  RADIOPHARMACEUTICALS:  24.5 mCi Tc-16m in-vitro labeled autologous red cells.  COMPARISON:  CT abdomen and pelvis 06/09/2013, MR abdomen 09/12/2013  FINDINGS: Significant enlargement of spleen.  Beginning on initial image, abnormal tracer localization is identified in the RIGHT lower quadrant at the approximate position of the ileostomy.  This is  confirmed with a lead marker which shows that the area in question corresponds to the ileostomy.  This focus of abnormal increased red cell localization persists throughout the 2 hr of imaging as well as the following delayed static image.  This accumulation does not peristalse within bowel loops or demonstrate motion.  No accumulation of this tracer into the ostomy bag is identified.  This likely represents red cell localization within a hyperemic segment of small bowel at the ileostomy, question active Crohn's disease.  This is not felt to represent a site of active GI bleeding due to its fixed position throughout the exam.  No other abnormal sites of tracer accumulation identified.  IMPRESSION: Abnormal accumulation of tracer in the RIGHT lower quadrant at the ileostomy, demonstrating fixed position/no motion of this collection of tagged red cells throughout the  period of imaging.  This likely represents a hyperemic distal ileal bowel segment suspicious for active Crohn's disease.  The lack of motion of this tagged red cell collection is atypical for active GI bleeding.  Splenomegaly, prior cross-sectional imaging studies demonstrating cirrhotic features and splenomegaly as well.   Electronically Signed   By: Ulyses Southward M.D.   On: 11/25/2013 12:41    Principal Problem:   GI bleeding Active Problems:   Crohn's disease   Hepatic cirrhosis   Hepatitis C antibody test positive   GI bleed   Thrombocytopenia   CKD (chronic kidney disease), stage III   Anemia of chronic disease   Assessment/Plan 1. Small bowel GIB, possibly secondary to active Crohn's. 2. Anemia of chronic disease, stable. 3. History of Crohn's disease with ileostomy 4. Cirrhosis with history of splenomegaly, thrombocytopenia. 5. History of hepatitis C, treated, viral load undetectable  6. CKD stage III, stable   Appears stable. No evidence of ongoing bleeding and NM scan not felt to reflect acute bleeding. Await GI evaluation  and recommendations.  Plan serial CBC. NPO for now.  Code Status: full code  DVT prophylaxis:SCDs Family Communication: none present Disposition Plan/Anticipated LOS: home  Time spent: 20 minutes  Brendia Sacks, MD  [facility] Pager [phone] 11/25/2013, 1:26 PM

## 2013-11-25 NOTE — ED Notes (Signed)
Pt. taken to NM by Rebecca Eaton

## 2013-11-25 NOTE — ED Notes (Signed)
Pt. Being transferred to stepdown by this nurse and Tammy Sours, RN.  Pt. Hooked up to tele.  ICU called to let them know she's coming up and spoke with Casimiro Needle.

## 2013-11-25 NOTE — ED Notes (Signed)
Bright red color noted in pts. ileostomy and large dark clot.

## 2013-11-25 NOTE — ED Provider Notes (Signed)
CSN: [id number]     Arrival date & time 11/25/13  1308 History  This chart was scribed for Ward Givens, MD by Karle Plumber, ED Scribe. This patient was seen in room [facility] and the patient's care was started at 7:06 AM.  Chief Complaint  Patient presents with  . GI Bleeding    The history is provided by the patient. No language interpreter was used.   HPI Comments:  Kathryn [name] is a 58 y.o. female with PMHx of HTN, Crohn's disease with a colostomy who presents to the Emergency Department complaining of a GI bleed that began PTA. She states she got up and her colostomy bag felt fuller than usual and when she looked it was full of blood. She has changed that bag and now has on her second bag which also has blood but also some stool. Pt reports associated subjective fever and upper abdominal pain that is worst on the left for the past few days. She states she was seen by her PCP last week for itching in her groin, irritation, nocturia and was diagnosed with a yeast infection and treated with Diflucan. Those symptoms have improved. She endorses some mild generalized weakness and dizziness this morning. Pt reports she had a MRI and "swallowed a camera"  two months ago secondary to a GI bleed and had to receive a blood transfusion with 3 unites of PRC's. She states the source of that bleeding was not found. She does not feel that this is as significant as her past bleed was. Denies nausea or vomiting.   PCP Dr Christell Constant GI Dr [name]  Past Medical History  Diagnosis Date  . Hypertension   . Enteritis presumed infectious   . Crohn's   . Crohn's   . Anemia   . Hepatitis C antibody test positive   . Hepatitis    Past Surgical History  Procedure Laterality Date  . Colon surgery      MULTIPLE SURGERIES FOR CROHNS  . Ileostomy      multiple abdominal surgeries for Crohn's by Dr. Gabriel Cirri  . Cholecystectomy    . Abscess drainage      abdominal  . Esophagogastroduodenoscopy  10/05/2010     Procedure: ESOPHAGOGASTRODUODENOSCOPY (EGD);  Surgeon: [name], MD;  [facility]: [facility];  Service: Endoscopy;  Laterality: N/A;  7:30 am  . Cirrhosis    . Esophagogastroduodenoscopy N/A 09/11/2013    Procedure: ESOPHAGOGASTRODUODENOSCOPY (EGD);  Surgeon: [name], MD;  [facility]: [facility];  Service: Endoscopy;  Laterality: N/A;  . Ileoscopy  09/11/2013    Procedure: ILEOSCOPY THROUGH STOMA;  Surgeon: [name], MD;  [facility]: [facility];  Service: Endoscopy;;  . Givens capsule study N/A 09/12/2013    Procedure: GIVENS CAPSULE STUDY;  Surgeon: [name], MD;  Location: [facility];  Service: Endoscopy;  Laterality: N/A;   Family History  Problem Relation Age of Onset  . Cancer Mother   . Stroke Father    History  Substance Use Topics  . Smoking status: Never Smoker   . Smokeless tobacco: Never Used  . Alcohol Use: No   Lives at home Lives with spouse employed  OB History   Grav Para Term Preterm Abortions TAB SAB Ect Mult Living                 Review of Systems  Gastrointestinal: Positive for blood in stool. Negative for nausea and vomiting.  Dark blood present in colostomy bag.  Neurological: Positive for dizziness and weakness.  All other systems reviewed and are negative.   Allergies  Penicillins  Home Medications   Prior to Admission medications   Medication Sig Start Date End Date Taking? Authorizing Provider  acetaminophen (TYLENOL) 500 MG tablet Take 1,000 mg by mouth 2 (two) times daily as needed for mild pain.    Yes Historical Provider, MD  ALPRAZolam Prudy Feeler(XANAX) 1 MG tablet Take 1 tablet (1 mg total) by mouth at bedtime as needed for sleep. 08/19/13  Yes Junie Spencerhristy A Hawks, FNP  fluconazole (DIFLUCAN) 150 MG tablet Take 1 tablet (150 mg total) by mouth once. 11/21/13  Yes Junie Spencerhristy A Hawks, FNP  gabapentin (NEURONTIN) 100 MG capsule Take 1 capsule (100 mg total) by mouth 3 (three) times daily. 08/27/13  Yes Junie Spencerhristy A Hawks, FNP   Multiple Vitamins-Minerals (MULTIVITAMIN WITH MINERALS) tablet Take 1 tablet by mouth daily.   Yes Historical Provider, MD  nadolol (CORGARD) 20 MG tablet Take 1 tablet (20 mg total) by mouth daily. 10/17/13  Yes Len Blalockerri L Setzer, NP  pantoprazole (PROTONIX) 40 MG tablet TAKE 1 TABLET BY MOUTH DAILY 10/18/13  Yes Len Blalockerri L Setzer, NP  traMADol (ULTRAM) 50 MG tablet Take 1 tablet (50 mg total) by mouth 2 (two) times daily as needed for moderate pain. 10/23/13  Yes Junie Spencerhristy A Hawks, FNP   Triage Vitals: BP 126/86  Pulse 73  Temp(Src) 98.8 F (37.1 C) (Oral)  Resp 18  Ht 5' 5.5" (1.664 m)  Wt 232 lb (105.235 kg)  BMI 38.01 kg/m2  SpO2 100%  07:23 Orthostatic Vital Signs VP Orthostatic Lying - BP- Lying: 122/76 mmHg ; Pulse- Lying: 65  Orthostatic Sitting - BP- Sitting: 104/75 mmHg ; Pulse- Sitting: 70  Orthostatic Standing at 0 minutes - BP- Standing at 0 minutes: 99/81 mmHg ; Pulse- Standing at 0 minutes: 79  Mild orthostasis on standing  Vital signs normal   Physical Exam  Nursing note and vitals reviewed. Constitutional: She is oriented to person, place, and time. She appears well-developed and well-nourished.  Non-toxic appearance. She does not appear ill. No distress.  HENT:  Head: Normocephalic and atraumatic.  Right Ear: External ear normal.  Left Ear: External ear normal.  Nose: Nose normal. No mucosal edema or rhinorrhea.  Mouth/Throat: Oropharynx is clear and moist. Mucous membranes are dry. No dental abscesses or uvula swelling.  Eyes: EOM are normal. Pupils are equal, round, and reactive to light.  Conjunctiva mildly pale.  Neck: Normal range of motion and full passive range of motion without pain. Neck supple.  Cardiovascular: Normal rate, regular rhythm and normal heart sounds.  Exam reveals no gallop and no friction rub.   No murmur heard. Pulmonary/Chest: Effort normal and breath sounds normal. No respiratory distress. She has no wheezes. She has no rhonchi. She has no  rales. She exhibits no tenderness and no crepitus.  Abdominal: Soft. Normal appearance and bowel sounds are normal. She exhibits no distension. There is no tenderness. There is no rebound and no guarding.  Colostomy is almost full with dark blood  Musculoskeletal: Normal range of motion. She exhibits no edema and no tenderness.  Moves all extremities well.   Neurological: She is alert and oriented to person, place, and time. She has normal strength. No cranial nerve deficit.  Skin: Skin is warm, dry and intact. No rash noted. No erythema. No pallor.  Psychiatric: She has a normal mood and affect. Her speech is  normal and behavior is normal. Her mood appears not anxious.    ED Course  Procedures (including critical care time)   Medications  heparin lock flush 100 UNIT/ML injection (not administered)  sodium chloride 0.9 % injection (not administered)  0.9 %  sodium chloride infusion ( Intravenous New Bag/Given 11/25/13 0743)      DIAGNOSTIC STUDIES: Oxygen Saturation is 100% on RA, normal by my interpretation.   COORDINATION OF CARE: 7:14 AM- Will order lab work and start IV. Pt verbalizes understanding and agrees to plan.  7:59 AM- Informed pt of lab results.  08:33 Dr [name], have hospitalist admit, order GI bleeding scan, he will see around lunchtime  09:08 Dr Irene Limbo, admit to stepdown, team 1  Pt given 1 liter NS for her mild orthostasis on standing    Labs Review Results for orders placed during the hospital encounter of 11/25/13  CBC WITH DIFFERENTIAL      Result Value Ref Range   WBC 4.2  4.0 - 10.5 K/uL   RBC 3.58 (*) 3.87 - 5.11 MIL/uL   Hemoglobin 9.5 (*) 12.0 - 15.0 g/dL   HCT 81.1 (*) 91.4 - 78.2 %   MCV 81.3  78.0 - 100.0 fL   MCH 26.5  26.0 - 34.0 pg   MCHC 32.6  30.0 - 36.0 g/dL   RDW 95.6 (*) 21.3 - 08.6 %   Platelets 116 (*) 150 - 400 K/uL   Neutrophils Relative % 58  43 - 77 %   Neutro Abs 2.4  1.7 - 7.7 K/uL   Lymphocytes Relative 25  12 - 46 %    Lymphs Abs 1.0  0.7 - 4.0 K/uL   Monocytes Relative 12  3 - 12 %   Monocytes Absolute 0.5  0.1 - 1.0 K/uL   Eosinophils Relative 6 (*) 0 - 5 %   Eosinophils Absolute 0.2  0.0 - 0.7 K/uL   Basophils Relative 1  0 - 1 %   Basophils Absolute 0.0  0.0 - 0.1 K/uL  COMPREHENSIVE METABOLIC PANEL      Result Value Ref Range   Sodium 137  137 - 147 mEq/L   Potassium 4.4  3.7 - 5.3 mEq/L   Chloride 104  96 - 112 mEq/L   CO2 21  19 - 32 mEq/L   Glucose, Bld 115 (*) 70 - 99 mg/dL   BUN 15  6 - 23 mg/dL   Creatinine, Ser 5.78 (*) 0.50 - 1.10 mg/dL   Calcium 9.1  8.4 - 46.9 mg/dL   Total Protein 6.8  6.0 - 8.3 g/dL   Albumin 3.1 (*) 3.5 - 5.2 g/dL   AST 23  0 - 37 U/L   ALT 12  0 - 35 U/L   Alkaline Phosphatase 146 (*) 39 - 117 U/L   Total Bilirubin 0.5  0.3 - 1.2 mg/dL   GFR calc non Af Amer 45 (*) >90 mL/min   GFR calc Af Amer 52 (*) >90 mL/min   Anion gap 12  5 - 15  PROTIME-INR      Result Value Ref Range   Prothrombin Time 15.6 (*) 11.6 - 15.2 seconds   INR 1.22  0.00 - 1.49   Laboratory interpretation all normal except stable anemia, renal insuffic     Imaging Review No results found.   EKG Interpretation None      MDM   Final diagnoses:  GI bleed  Anemia, unspecified anemia type    Plan admission  Devoria Albe, MD, FACEP   I personally performed the services described in this documentation, which was scribed in my presence. The recorded information has been reviewed and considered.  Devoria Albe, MD, Armando Gang    Ward Givens, MD 11/25/13 [id number]

## 2013-11-25 NOTE — Consult Note (Signed)
Referring Provider: Dr. Brendia Sacksaniel Goodrich, M.D.  Primary Care Physician:  Rudi HeapMOORE, DONALD, MD Primary Gastroenterologist:  Dr. Karilyn Cotaehman  Reason for Consultation:    Bleeding into ileostomy.  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 She she emptied the bag around 3:30 AM when she woke up to void and did not notice blood. Patient states that she has not been feeling well for the last few days. She noted pain in epigastrium and left upper quadrant and also had low-grade fever. She thought she had UTI and was found to have yeast in her urine and she was given single pale Diflucan she took 3 days ago. She is not experiencing abdominal pain today. She denies nausea or vomiting. She does not take OTC NSAIDs.  Prior GI history is as follows; Patient was hospitalized about 10 weeks ago for 3 unit bleed and workup was negative. She had EGD revealing portal gastropathy, normal terminal ileoscopy and small bowel given capsule did not reveal any source of bleeding.  On office visit patient was complaining of exertional dyspnea and profound weakness. Echocardiography was obtained in LV function was normal. Patient's symptoms have resolved with improvement in H&H.  Patient underwent EGD on August 2012 for melena and anemia and found to have multiple gastric erosions. Biopsy was negative for H. pylori infection no granulomas were seen to suggest gastric Crohn's disease.  Patient has history of colonic and anorectal Crohn's disease which was diagnosed in 321985. She underwent sigmoid colostomy for colovaginal fistula in 1990. In 2006 she had a subtotal colectomy with permanent ileostomy.     Past  Medical History  Diagnosis Date  . Hypertension   .  history of Crohn's disease; details as above.            . Anemia   .  history of chronic hepatitis C with cirrhosis treated with 12 weeks weeks of Harvoni.HCV RNA negative at [facility].       . Renal insufficiency   . Cirrhosis secondary to hepatitis C.    Marland Kitchen. Splenomegaly secondary to cirrhosis      Past Surgical History  Procedure Laterality Date  . Colon surgery      MULTIPLE SURGERIES FOR CROHNS  . Ileostomy      multiple abdominal surgeries for Crohn's by Dr. Gabriel CirrieMason  . Cholecystectomy    . Abscess drainage      abdominal  . Esophagogastroduodenoscopy  10/05/2010    Procedure: ESOPHAGOGASTRODUODENOSCOPY (EGD);  Surgeon: Malissa HippoNajeeb U Areliz Rothman, MD;  [facility];  Service: Endoscopy;  Laterality: N/A;  7:30 am  . Cirrhosis    . Esophagogastroduodenoscopy N/A 09/11/2013    Procedure: ESOPHAGOGASTRODUODENOSCOPY (EGD);  Surgeon: Malissa HippoNajeeb U Orville Mena, MD;  Location: [facility];  Service: Endoscopy;  Laterality: N/A;  . Ileoscopy  09/11/2013    Procedure: ILEOSCOPY THROUGH STOMA;  Surgeon: Malissa HippoNajeeb U Yarielys Beed, MD;  Location: [facility];  Service: Endoscopy;;  . Givens capsule study N/A 09/12/2013    Procedure: GIVENS CAPSULE STUDY;  Surgeon: Malissa HippoNajeeb U Saloma Cadena, MD;  Location: [facility];  Service: Endoscopy;  Laterality: N/A;    Prior to Admission medications   Medication Sig Start Date End Date Taking? Authorizing  Provider  acetaminophen (TYLENOL) 500 MG tablet Take 1,000 mg by mouth 2 (two) times daily as needed for mild pain.    Yes Historical Provider, MD  ALPRAZolam Prudy Feeler) 1 MG tablet Take 1 tablet (1 mg total) by mouth at bedtime as needed for sleep. 08/19/13  Yes Junie Spencer, FNP  gabapentin (NEURONTIN) 100 MG capsule Take 1 capsule (100 mg total) by mouth 3 (three) times daily. 08/27/13  Yes Junie Spencer, FNP  Multiple Vitamins-Minerals (MULTIVITAMIN WITH MINERALS) tablet Take 1 tablet by mouth daily.   Yes Historical  Provider, MD  nadolol (CORGARD) 20 MG tablet Take 1 tablet (20 mg total) by mouth daily. 10/17/13  Yes Len Blalock, NP  pantoprazole (PROTONIX) 40 MG tablet TAKE 1 TABLET BY MOUTH DAILY 10/18/13  Yes Len Blalock, NP  traMADol (ULTRAM) 50 MG tablet Take 1 tablet (50 mg total) by mouth 2 (two) times daily as needed for moderate pain. 10/23/13  Yes Junie Spencer, FNP    Current Facility-Administered Medications  Medication Dose Route Frequency Provider Last Rate Last Dose  . 0.9 %  sodium chloride infusion   Intravenous Continuous Standley Brooking, MD      . ALPRAZolam Prudy Feeler) tablet 1 mg  1 mg Oral QHS PRN Standley Brooking, MD      . gabapentin (NEURONTIN) capsule 100 mg  100 mg Oral TID Standley Brooking, MD      . ondansetron [facility]) tablet 4 mg  4 mg Oral Q6H PRN Standley Brooking, MD       Or  . ondansetron [facility]) injection 4 mg  4 mg Intravenous Q6H PRN Standley Brooking, MD      . pantoprazole (PROTONIX) EC tablet 40 mg  40 mg Oral Daily Standley Brooking, MD      . sodium chloride 0.9 % injection           . traMADol (ULTRAM) tablet 50 mg  50 mg Oral BID PRN Standley Brooking, MD        Allergies as of 11/25/2013 - Review Complete 11/25/2013  Allergen Reaction Noted  . Penicillins Rash 08/19/2010    Family History  Problem Relation Age of Onset  . Cancer Mother   . Stroke Father     History   Social History  . Marital Status: Married    Spouse Name: N/A    Number of Children: N/A  . Years of Education: N/A   Occupational History  . Not on file.   Social History Main Topics  . Smoking status: Never Smoker   . Smokeless tobacco: Never Used  . Alcohol Use: No  . Drug Use: No  . Sexual Activity: Yes   Other Topics Concern  . Not on file   Social History Narrative  . No narrative on file    Review of Systems: See HPI, otherwise normal ROS  Physical Exam: Temp:  [97.8 F (36.6 C)-98.8 F (37.1 C)] 97.8 F (36.6 C) (10/19 1200) Pulse Rate:   [61-73] 61 (10/19 1300) Resp:  [12-20] 12 (10/19 1300) BP: (99-131)/(66-86) 99/66 mmHg (10/19 1300) SpO2:  [100 %] 100 % (10/19 1300) Weight:  [223 lb 12.3 oz (101.5 kg)-232 lb (105.235 kg)] 223 lb 12.3 oz (101.5 kg) (10/19 1200)   Patient is alert and in no acute distress. Conjunctiva is pale. Sclerae nonicteric. Oropharyngeal mucosa is normal. No neck masses or thyromegaly noted. Heart exam with regular rhythm normal S1 and S2. No murmur  or gallop noted. Lungs are clear to auscultation. Abdomen is protuberant with ileostomy in right lower quadrant and urostomy bag is full of burgundy blood and clots. Abdomen is soft and enlarged easily palpable spleen or liver edge is indistinct. No tenderness or masses noted. No peripheral edema or clubbing noted.      Lab Results:  Recent Labs  11/25/13 0704  WBC 4.2  HGB 9.5*  HCT 29.1*  PLT 116*   BMET  Recent Labs  11/25/13 0704  NA 137  K 4.4  CL 104  CO2 21  GLUCOSE 115*  BUN 15  CREATININE 1.30*  CALCIUM 9.1   LFT  Recent Labs  11/25/13 0704  PROT 6.8  ALBUMIN 3.1*  AST 23  ALT 12  ALKPHOS 146*  BILITOT 0.5   PT/INR  Recent Labs  11/25/13 0704  LABPROT 15.6*  INR 1.22   Studies/Results: Nm Gi Blood Loss  11/25/2013   CLINICAL DATA:  Crohn's disease, post colectomy and ileostomy, who abdominal patent and low grade fever for 2 days, blood in ileostomy bag this morning  EXAM: NUCLEAR MEDICINE GASTROINTESTINAL BLEEDING SCAN  TECHNIQUE: Sequential abdominal images were obtained following intravenous administration of Tc-33m labeled red blood cells. Images were obtained for 2 hr. A delayed static images obtained with the lead marker over the ileostomy.  RADIOPHARMACEUTICALS:  24.5 mCi Tc-53m in-vitro labeled autologous red cells.  COMPARISON:  CT abdomen and pelvis 06/09/2013, MR abdomen 09/12/2013  FINDINGS: Significant enlargement of spleen.  Beginning on initial image, abnormal tracer localization is identified  in the RIGHT lower quadrant at the approximate position of the ileostomy.  This is confirmed with a lead marker which shows that the area in question corresponds to the ileostomy.  This focus of abnormal increased red cell localization persists throughout the 2 hr of imaging as well as the following delayed static image.  This accumulation does not peristalse within bowel loops or demonstrate motion.  No accumulation of this tracer into the ostomy bag is identified.  This likely represents red cell localization within a hyperemic segment of small bowel at the ileostomy, question active Crohn's disease.  This is not felt to represent a site of active GI bleeding due to its fixed position throughout the exam.  No other abnormal sites of tracer accumulation identified.  IMPRESSION: Abnormal accumulation of tracer in the RIGHT lower quadrant at the ileostomy, demonstrating fixed position/no motion of this collection of tagged red cells throughout the period of imaging.  This likely represents a hyperemic distal ileal bowel segment suspicious for active Crohn's disease.  The lack of motion of this tagged red cell collection is atypical for active GI bleeding.  Splenomegaly, prior cross-sectional imaging studies demonstrating cirrhotic features and splenomegaly as well.   Electronically Signed   By: Ulyses Southward M.D.   On: 11/25/2013 12:41   I have reviewed GI bleeding scan images for Dr. Tyron Russell there is no active bleeding.  Assessment; #1.Recurrent GI bleed from small bowel source in a patient with history of cirrhosis and Crohn's disease. Patient was evaluated 10 weeks ago for 3 unit bleed in source could not be identified on EGD, ileoscopy and small bowel given capsule study. No bleeding source identified on GI bleeding scan was just completed. Suspect bleeding most likely related to cirrhosis and portal hypertension rather than Crohn's disease. She appears to be bleeding again since bleeding scan was completed  as ileostomy bag is full of burgundy blood and clots. Therefore would make another  attempt to pinpoint the source and site of small bowel bleed with given capsule before considering referral to tertiary center for small bowel endoscopy. #2. Patient has completed 12 weeks of Harvoni for for chronic hepatitis C and will be due for HCV RNA in about 8 weeks to document SVR.   Recommendations; Small bowel given capsule study today. Study will be completed later tonight and will be read in the a.m or earlier if there is evidence of accelerated bleed.    LOS: 0 days   Gaylen Pereira U  11/25/2013, 2:10 PM

## 2013-11-26 DIAGNOSIS — K922 Gastrointestinal hemorrhage, unspecified: Secondary | ICD-10-CM | POA: Diagnosis present

## 2013-11-26 DIAGNOSIS — Z87448 Personal history of other diseases of urinary system: Secondary | ICD-10-CM | POA: Diagnosis not present

## 2013-11-26 DIAGNOSIS — Z79899 Other long term (current) drug therapy: Secondary | ICD-10-CM | POA: Diagnosis not present

## 2013-11-26 DIAGNOSIS — K5000 Crohn's disease of small intestine without complications: Secondary | ICD-10-CM | POA: Diagnosis present

## 2013-11-26 DIAGNOSIS — D638 Anemia in other chronic diseases classified elsewhere: Secondary | ICD-10-CM | POA: Diagnosis present

## 2013-11-26 DIAGNOSIS — K5090 Crohn's disease, unspecified, without complications: Secondary | ICD-10-CM | POA: Diagnosis not present

## 2013-11-26 DIAGNOSIS — D696 Thrombocytopenia, unspecified: Secondary | ICD-10-CM | POA: Diagnosis present

## 2013-11-26 DIAGNOSIS — D649 Anemia, unspecified: Secondary | ICD-10-CM

## 2013-11-26 DIAGNOSIS — R000 Tachycardia, unspecified: Secondary | ICD-10-CM | POA: Diagnosis not present

## 2013-11-26 DIAGNOSIS — Z880 Allergy status to penicillin: Secondary | ICD-10-CM | POA: Diagnosis not present

## 2013-11-26 DIAGNOSIS — D62 Acute posthemorrhagic anemia: Secondary | ICD-10-CM

## 2013-11-26 DIAGNOSIS — K633 Ulcer of intestine: Secondary | ICD-10-CM

## 2013-11-26 DIAGNOSIS — Z933 Colostomy status: Secondary | ICD-10-CM | POA: Diagnosis not present

## 2013-11-26 DIAGNOSIS — Z9889 Other specified postprocedural states: Secondary | ICD-10-CM | POA: Diagnosis not present

## 2013-11-26 DIAGNOSIS — K50911 Crohn's disease, unspecified, with rectal bleeding: Secondary | ICD-10-CM

## 2013-11-26 DIAGNOSIS — Z809 Family history of malignant neoplasm, unspecified: Secondary | ICD-10-CM | POA: Diagnosis not present

## 2013-11-26 DIAGNOSIS — Z823 Family history of stroke: Secondary | ICD-10-CM | POA: Diagnosis not present

## 2013-11-26 DIAGNOSIS — I129 Hypertensive chronic kidney disease with stage 1 through stage 4 chronic kidney disease, or unspecified chronic kidney disease: Secondary | ICD-10-CM | POA: Diagnosis present

## 2013-11-26 DIAGNOSIS — N183 Chronic kidney disease, stage 3 (moderate): Secondary | ICD-10-CM | POA: Diagnosis present

## 2013-11-26 DIAGNOSIS — Z8619 Personal history of other infectious and parasitic diseases: Secondary | ICD-10-CM | POA: Diagnosis not present

## 2013-11-26 DIAGNOSIS — I10 Essential (primary) hypertension: Secondary | ICD-10-CM | POA: Diagnosis not present

## 2013-11-26 DIAGNOSIS — R894 Abnormal immunological findings in specimens from other organs, systems and tissues: Secondary | ICD-10-CM

## 2013-11-26 DIAGNOSIS — K7460 Unspecified cirrhosis of liver: Secondary | ICD-10-CM | POA: Diagnosis present

## 2013-11-26 DIAGNOSIS — B182 Chronic viral hepatitis C: Secondary | ICD-10-CM | POA: Diagnosis present

## 2013-11-26 DIAGNOSIS — Z9049 Acquired absence of other specified parts of digestive tract: Secondary | ICD-10-CM | POA: Diagnosis present

## 2013-11-26 DIAGNOSIS — Z932 Ileostomy status: Secondary | ICD-10-CM | POA: Diagnosis not present

## 2013-11-26 LAB — CBC
HCT: 24.2 % — ABNORMAL LOW (ref 36.0–46.0)
HEMATOCRIT: 24.7 % — AB (ref 36.0–46.0)
HEMOGLOBIN: 7.8 g/dL — AB (ref 12.0–15.0)
Hemoglobin: 8.1 g/dL — ABNORMAL LOW (ref 12.0–15.0)
MCH: 26.3 pg (ref 26.0–34.0)
MCH: 26.8 pg (ref 26.0–34.0)
MCHC: 32.2 g/dL (ref 30.0–36.0)
MCHC: 32.8 g/dL (ref 30.0–36.0)
MCV: 81.5 fL (ref 78.0–100.0)
MCV: 81.8 fL (ref 78.0–100.0)
PLATELETS: 85 10*3/uL — AB (ref 150–400)
Platelets: ADEQUATE 10*3/uL (ref 150–400)
RBC: 2.97 MIL/uL — AB (ref 3.87–5.11)
RBC: 3.02 MIL/uL — ABNORMAL LOW (ref 3.87–5.11)
RDW: 16 % — ABNORMAL HIGH (ref 11.5–15.5)
RDW: 16 % — AB (ref 11.5–15.5)
WBC: 3.3 10*3/uL — ABNORMAL LOW (ref 4.0–10.5)
WBC: 3.1 10*3/uL — ABNORMAL LOW (ref 4.0–10.5)

## 2013-11-26 LAB — HEMOGLOBIN AND HEMATOCRIT, BLOOD
HCT: 26.5 % — ABNORMAL LOW (ref 36.0–46.0)
Hemoglobin: 8.9 g/dL — ABNORMAL LOW (ref 12.0–15.0)

## 2013-11-26 LAB — BASIC METABOLIC PANEL
Anion gap: 12 (ref 5–15)
BUN: 15 mg/dL (ref 6–23)
CALCIUM: 8.7 mg/dL (ref 8.4–10.5)
CO2: 22 meq/L (ref 19–32)
CREATININE: 1.04 mg/dL (ref 0.50–1.10)
Chloride: 104 mEq/L (ref 96–112)
GFR calc Af Amer: 68 mL/min — ABNORMAL LOW (ref >90)
GFR, EST NON AFRICAN AMERICAN: 58 mL/min — AB (ref >90)
GLUCOSE: 84 mg/dL (ref 70–99)
Potassium: 4.3 mEq/L (ref 3.7–5.3)
Sodium: 138 mEq/L (ref 137–147)

## 2013-11-26 LAB — PREPARE RBC (CROSSMATCH)

## 2013-11-26 MED ORDER — ACETAMINOPHEN 325 MG PO TABS
650.0000 mg | ORAL_TABLET | Freq: Once | ORAL | Status: AC
  Administered 2013-11-26: 650 mg via ORAL
  Filled 2013-11-26: qty 2

## 2013-11-26 MED ORDER — SODIUM CHLORIDE 0.9 % IV SOLN: Freq: Once | INTRAVENOUS | Status: AC

## 2013-11-26 MED ORDER — DIPHENHYDRAMINE HCL 25 MG PO CAPS
25.0000 mg | ORAL_CAPSULE | Freq: Once | ORAL | Status: AC
  Administered 2013-11-26: 25 mg via ORAL
  Filled 2013-11-26: qty 1

## 2013-11-26 MED ORDER — MESALAMINE ER 250 MG PO CPCR
1000.0000 mg | ORAL_CAPSULE | Freq: Three times a day (TID) | ORAL | Status: DC
  Administered 2013-11-26 – 2013-11-27 (×4): 1000 mg via ORAL
  Filled 2013-11-26 (×10): qty 4

## 2013-11-26 NOTE — Progress Notes (Signed)
Patient has no complaints. She has not passed blood into the ileostomy and almost 20 hours. Abdominal exam reveals soft abdomen with splenomegaly and no tenderness. Ileostomy bag has greenish yellow stool. H&H is 8.1 and 24.7 and platelet count 85K. Small bowel given capsule study reviewed; 3 small bowel erosions identified without stigmata of bleed. Separate report to be completed No fresh or old blood noted. In stomach and/or small bowel.   Assessment; Recurrent GI bleed from small bowel. Location and etiology not identified despite multiple studies. She has few small bowel erosions which may or may not be source of GI bleed. These erosions may be secondary to Crohn's disease. Anemia multifactorial but primarily secondary to GI bleed. Thrombocytopenia secondary to cirrhosis and splenomegaly.  Recommendations; Transfuse 1 unit of PRBCs today. Advance diet. Begin Pentasa 1 g by mouth 3 times a day. If bleeding recurs she will referred to [facility].

## 2013-11-26 NOTE — Op Note (Signed)
Small Bowel Givens Capsule Study Procedure date:  11/25/2013  Referring Provider:  Brendia Sacks MD PCP:  Dr. [name], MD  Indication for procedure:  Patient is 58 year old Caucasian female with history of Crohn's disease and cirrhosis who presents with recurrent GI bleed. She had 3 unit GI bleed about 10 weeks ago and workup was negative including EGD, ileoscopy, small bowel given capsule study and MRI. She she began to pass burgundy blood in clots into ileostomy bag early this morning. GI bleeding scan is negative. She is therefore undergoing small bowel given capsule study hoping to pinpoint the site and source of GI bleed.    Findings:   Patient was able to swallow given capsule without any difficulty. Focal mucosal edema noted to mucosa of proximal small bowel best on image at 37 minutes and 15 seconds. Four erosions noted involving small bowel without stigmata of bleed(36 min and 13 sec, 56 min and 3 sec; 1 hr and 34 sec and 1 hr, 48 min and 34 sec). No fresh or old blood noted in the stomach or small bowel.  First Gastric image:  42 sec First Duodenal image: 32 min and 8 sec First Ileo-Cecal Valve image: Not applicable First Cecal image: Not applicable Gastric Passage time: 31 min Small Bowel Passage time to ileostomy:  2 hr and 7 min  Summary & Recommendations: Few small bowel erosions without stigmata of bleed. These erosions may indicate mild small bowel Crohn's disease as there is no history of NSAID use. Will start patient on Pentasa 1 g by mouth 3 times a day. If bleeding recurs she will need to be transferred to [facility] for small bowel endoscopy.

## 2013-11-26 NOTE — Progress Notes (Signed)
[facility] PROGRESS NOTE  Kathryn BaumannCindy L [name] ZOX:[id number] DOB: [birth year] DOA: 11/25/2013 PCP: Rudi HeapMOORE, DONALD, MD  Assessment/Plan: 1. Recurrent small bowel GI bleed-status post capsule endoscopy which did not reveal any significant bleeding site. Patient will be transfused 1 unit of PRBC, discussed with GI Dr. Minus Libertyahman, who recommends to observe the patient overnight to see if she re bleeds. [facility] Patient was hospitalized in August at that time she had an EGD revealing portal gastropathy, normal terminal ileoscopy and small bowel capsule did not reveal any source of bleeding. 2. Anemia of chronic disease/ GI bleed- patient's hemoglobin is 8.1 today, will get one more unit of PRBC. Hemoglobin was 8.8 at the time of admission. Check CBC in a.m. 3. History of Crohn's disease with ileostomy- stable, patient followed by GI as outpatient, Pentasa 1 g by mouth 3 times a day started by GI. 4. Thrombocytopenia- patient has history of liver cirrhosis with splenomegaly. Platelet count today is 85. Continue to monitor the platelets in the hospital. 5. History of hepatitis C- patient has completed 12 weeks of Harvoni for chronic hepatitis C, will followup with GI as outpatient for HCV RNA. 6. Liver cirrhosis- patient has [facility] of [facility], liver enzymes have been  Normal. Followed by gastroenterology as outpatient.  Code Status: Full code Family Communication: *No family at bedside Disposition Plan: *Home when stable   Consultants:  Gastroenterology  Procedures:  Small bowel capsule study  Antibiotics:  *None  HPI/Subjective: 58yow with h/o Crohn's disease presented to ED with h/o dark blood and clots starting this AM. Admitted for GIB.  Admitted 09/2013 for GIB requiring 2U PRBC, underwent EGD and capsule study which were unrevealing, but small bowel source presumed  Reports some upper abdominal pain over the last 2 days and general  maliase. This AM had large amount of liquid in ileostomy bag (unusual for AM), dark blood and clots. Abdominal pain resolved bowel movement. No recent bleeding. No complaints now, no abdominal pain.  In the emergency department afebrile, VSS, orthostatics positive. Hgb and plts at baseline and CMP without acute findings. NM study did not reveal acute bleeding--area of blood collection suspicious for active Crohn's disease.    Patient seen and examined, no further bloody stools. S/p small bowel capsule study.  Objective: Filed Vitals:   11/26/13 1131  BP:   Pulse:   Temp: 99.5 F (37.5 C)  Resp:     Intake/Output Summary (Last 24 hours) at 11/26/13 1150 Last data filed at 11/26/13 1132  Gross per 24 hour  Intake 1103.33 ml  Output    625 ml  Net 478.33 ml   Filed Weights   11/25/13 1200 11/25/13 1436 11/26/13 0500  Weight: 101.5 kg (223 lb 12.3 oz) 101.152 kg (223 lb) 102.5 kg (225 lb 15.5 oz)    Exam:  Physical Exam: Head: Normocephalic, atraumatic.  Eyes: No signs of jaundice, EOMI Lungs: Normal respiratory effort. B/L Clear to auscultation, no crackles or wheezes.  Heart: Regular RR. S1 and S2 normal  Abdomen: BS normoactive. Soft, Nondistended, non-tender. Ileostomy bag in place Extremities: No pretibial edema, no erythema   Data Reviewed: Basic Metabolic Panel:  Recent Labs Lab 11/25/13 0704 11/26/13 0739  NA 137 138  K 4.4 4.3  CL 104 104  CO2 21 22  GLUCOSE 115* 84  BUN 15 15  CREATININE 1.30* 1.04  CALCIUM 9.1 8.7   Liver Function Tests:  Recent Labs Lab 11/25/13 0704  AST  23  ALT 12  ALKPHOS 146*  BILITOT 0.5  PROT 6.8  ALBUMIN 3.1*   No results found for this basename: LIPASE, AMYLASE,  in the last 168 hours No results found for this basename: AMMONIA,  in the last 168 hours CBC:  Recent Labs Lab 11/25/13 0704 11/25/13 1416 11/25/13 1755 11/26/13 0145 11/26/13 0743  WBC 4.2 4.0 3.8* 3.3* 3.1*  NEUTROABS 2.4  --   --   --   --    HGB 9.5* 8.8* 8.2* 7.8* 8.1*  HCT 29.1* 27.0* 25.3* 24.2* 24.7*  MCV 81.3 81.8 81.9 81.5 81.8  PLT 116* 101* 100* PLATELET CLUMPS NOTED ON SMEAR, COUNT APPEARS ADEQUATE 85*   Cardiac Enzymes: No results found for this basename: CKTOTAL, CKMB, CKMBINDEX, TROPONINI,  in the last 168 hours BNP (last 3 results) No results found for this basename: PROBNP,  in the last 8760 hours CBG: No results found for this basename: GLUCAP,  in the last 168 hours  Recent Results (from the past 240 hour(s))  URINE CULTURE     Status: Abnormal   Collection Time    11/21/13 11:55 AM      Result Value Ref Range Status   Urine Culture, Routine Final report (*)  Final   Result 1 Klebsiella pneumoniae (*)  Final   Comment: Greater than 100,000 colony forming units per mL   ANTIMICROBIAL SUSCEPTIBILITY Comment   Final   Comment:       ** S = Susceptible; I = Intermediate; R = Resistant **                        P = Positive; N = Negative                 MICS are expressed in micrograms per mL        Antibiotic                 RSLT#1    RSLT#2    RSLT#3    RSLT#4     Amoxicillin/Clavulanic Acid    S     Ampicillin                     R     Cefepime                       S     Ceftriaxone                    S     Cefuroxime                     S     Cephalothin                    S     Ciprofloxacin                  S     Ertapenem                      S     Gentamicin                     S     Imipenem                       S  Levofloxacin                   S     Nitrofurantoin                 S     Piperacillin                   S     Tetracycline                   S     Tobramycin                     S     Trimethoprim/Sulfa             S  MRSA PCR SCREENING     Status: None   Collection Time    11/25/13 12:45 PM      Result Value Ref Range Status   MRSA by PCR NEGATIVE  NEGATIVE Final   Comment:            The GeneXpert MRSA Assay (FDA     approved for NASAL specimens     only), is one  component of a     comprehensive MRSA colonization     surveillance program. It is not     intended to diagnose MRSA     infection nor to guide or     monitor treatment for     MRSA infections.     Studies: Nm Gi Blood Loss  11/25/2013   CLINICAL DATA:  Crohn's disease, post colectomy and ileostomy, who abdominal patent and low grade fever for 2 days, blood in ileostomy bag this morning  EXAM: NUCLEAR MEDICINE GASTROINTESTINAL BLEEDING SCAN  TECHNIQUE: Sequential abdominal images were obtained following intravenous administration of Tc-7m labeled red blood cells. Images were obtained for 2 hr. A delayed static images obtained with the lead marker over the ileostomy.  RADIOPHARMACEUTICALS:  24.5 mCi Tc-24m in-vitro labeled autologous red cells.  COMPARISON:  CT abdomen and pelvis 06/09/2013, MR abdomen 09/12/2013  FINDINGS: Significant enlargement of spleen.  Beginning on initial image, abnormal tracer localization is identified in the RIGHT lower quadrant at the approximate position of the ileostomy.  This is confirmed with a lead marker which shows that the area in question corresponds to the ileostomy.  This focus of abnormal increased red cell localization persists throughout the 2 hr of imaging as well as the following delayed static image.  This accumulation does not peristalse within bowel loops or demonstrate motion.  No accumulation of this tracer into the ostomy bag is identified.  This likely represents red cell localization within a hyperemic segment of small bowel at the ileostomy, question active Crohn's disease.  This is not felt to represent a site of active GI bleeding due to its fixed position throughout the exam.  No other abnormal sites of tracer accumulation identified.  IMPRESSION: Abnormal accumulation of tracer in the RIGHT lower quadrant at the ileostomy, demonstrating fixed position/no motion of this collection of tagged red cells throughout the period of imaging.  This likely  represents a hyperemic distal ileal bowel segment suspicious for active Crohn's disease.  The lack of motion of this tagged red cell collection is atypical for active GI bleeding.  Splenomegaly, prior cross-sectional imaging studies demonstrating cirrhotic features and splenomegaly as well.   Electronically Signed   By: Ulyses Southward M.D.   On: 11/25/2013 12:41    Scheduled  Meds: . sodium chloride   Intravenous Once  . acetaminophen  650 mg Oral Once  . diphenhydrAMINE  25 mg Oral Once  . gabapentin  100 mg Oral TID  . mesalamine  1,000 mg Oral TID  . pantoprazole  40 mg Oral Daily   Continuous Infusions: . sodium chloride 75 mL/hr at 11/26/13 1000  . sodium chloride 20 mL/hr at 11/26/13 0500    Principal Problem:   GI bleeding Active Problems:   Crohn's disease   Hepatic cirrhosis   Hepatitis C antibody test positive   GI bleed   Thrombocytopenia   CKD (chronic kidney disease), stage III   Anemia of chronic disease    Time spent: 25 min    [facility]  [facility] Pager [phone]. If 7PM-7AM, please contact night-coverage at [email or web address], password [facility] 11/26/2013, 11:50 AM  LOS: 1 day

## 2013-11-26 NOTE — Care Management Note (Addendum)
    Page 1 of 1   11/27/2013     1:32:27 PM CARE MANAGEMENT NOTE 11/27/2013  Patient:  Andris BaumannWILSON,Anahit L   Account Number:  [id number]  Date Initiated:  11/26/2013  Documentation initiated by:  Sharrie RothmanBLACKWELL,Arlana Canizales C  Subjective/Objective Assessment:   Pt admitted from home with gi bleed. Pt lives with her husband and will return home at discharge. Pt is independent with ADL's.     Action/Plan:   No Cm needs noted.   Anticipated DC Date:  11/27/2013   Anticipated DC Plan:  HOME/SELF CARE      DC Planning Services  CM consult      Choice offered to / List presented to:             Status of service:  Completed, signed off Medicare Important Message given?   (If response is "NO", the following Medicare IM given date fields will be blank) Date Medicare IM given:   Medicare IM given by:   Date Additional Medicare IM given:   Additional Medicare IM given by:    Discharge Disposition:  HOME/SELF CARE  Per UR Regulation:    If discussed at Long Length of Stay Meetings, dates discussed:    Comments:  11/27/13 1330 Arlyss Queenammy Chabely Norby, RN BSN CM Pt discharged home today. No CM needs noted.  11/26/13 1340 Arlyss Queenammy Matika Bartell, RN BSN CM

## 2013-11-27 ENCOUNTER — Emergency Department (HOSPITAL_COMMUNITY)
Admission: EM | Admit: 2013-11-27 | Discharge: 2013-11-27 | Disposition: A | Discharge status: Other Acute Inpatient Hospital | Payer: Medicare HMO | Attending: Emergency Medicine | Admitting: Emergency Medicine

## 2013-11-27 ENCOUNTER — Encounter (HOSPITAL_COMMUNITY): Payer: Self-pay | Admitting: Emergency Medicine

## 2013-11-27 DIAGNOSIS — Z8619 Personal history of other infectious and parasitic diseases: Secondary | ICD-10-CM | POA: Insufficient documentation

## 2013-11-27 DIAGNOSIS — K633 Ulcer of intestine: Secondary | ICD-10-CM

## 2013-11-27 DIAGNOSIS — D649 Anemia, unspecified: Secondary | ICD-10-CM

## 2013-11-27 DIAGNOSIS — K922 Gastrointestinal hemorrhage, unspecified: Secondary | ICD-10-CM

## 2013-11-27 DIAGNOSIS — K5090 Crohn's disease, unspecified, without complications: Secondary | ICD-10-CM | POA: Insufficient documentation

## 2013-11-27 DIAGNOSIS — Z79899 Other long term (current) drug therapy: Secondary | ICD-10-CM | POA: Insufficient documentation

## 2013-11-27 DIAGNOSIS — Z932 Ileostomy status: Secondary | ICD-10-CM | POA: Insufficient documentation

## 2013-11-27 DIAGNOSIS — Z9049 Acquired absence of other specified parts of digestive tract: Secondary | ICD-10-CM | POA: Insufficient documentation

## 2013-11-27 DIAGNOSIS — Z880 Allergy status to penicillin: Secondary | ICD-10-CM | POA: Insufficient documentation

## 2013-11-27 DIAGNOSIS — I10 Essential (primary) hypertension: Secondary | ICD-10-CM | POA: Insufficient documentation

## 2013-11-27 DIAGNOSIS — Z9889 Other specified postprocedural states: Secondary | ICD-10-CM | POA: Insufficient documentation

## 2013-11-27 DIAGNOSIS — R000 Tachycardia, unspecified: Secondary | ICD-10-CM | POA: Insufficient documentation

## 2013-11-27 DIAGNOSIS — G629 Polyneuropathy, unspecified: Secondary | ICD-10-CM

## 2013-11-27 DIAGNOSIS — Z87448 Personal history of other diseases of urinary system: Secondary | ICD-10-CM | POA: Insufficient documentation

## 2013-11-27 LAB — CBC WITH DIFFERENTIAL/PLATELET
BASOS ABS: 0 10*3/uL (ref 0.0–0.1)
Basophils Relative: 1 % (ref 0–1)
Eosinophils Absolute: 0.3 10*3/uL (ref 0.0–0.7)
Eosinophils Relative: 5 % (ref 0–5)
HEMATOCRIT: 26.9 % — AB (ref 36.0–46.0)
Hemoglobin: 9 g/dL — ABNORMAL LOW (ref 12.0–15.0)
LYMPHS PCT: 22 % (ref 12–46)
Lymphs Abs: 1.3 10*3/uL (ref 0.7–4.0)
MCH: 27.6 pg (ref 26.0–34.0)
MCHC: 33.5 g/dL (ref 30.0–36.0)
MCV: 82.5 fL (ref 78.0–100.0)
MONO ABS: 0.7 10*3/uL (ref 0.1–1.0)
Monocytes Relative: 12 % (ref 3–12)
NEUTROS ABS: 3.4 10*3/uL (ref 1.7–7.7)
Neutrophils Relative %: 61 % (ref 43–77)
Platelets: 110 10*3/uL — ABNORMAL LOW (ref 150–400)
RBC: 3.26 MIL/uL — AB (ref 3.87–5.11)
RDW: 15.5 % (ref 11.5–15.5)
WBC: 5.7 10*3/uL (ref 4.0–10.5)

## 2013-11-27 LAB — BASIC METABOLIC PANEL
ANION GAP: 11 (ref 5–15)
Anion gap: 9 (ref 5–15)
BUN: 13 mg/dL (ref 6–23)
BUN: 13 mg/dL (ref 6–23)
CHLORIDE: 107 meq/L (ref 96–112)
CHLORIDE: 106 meq/L (ref 96–112)
CO2: 23 meq/L (ref 19–32)
CO2: 21 mEq/L (ref 19–32)
CREATININE: 1.04 mg/dL (ref 0.50–1.10)
Calcium: 8.4 mg/dL (ref 8.4–10.5)
Calcium: 8.4 mg/dL (ref 8.4–10.5)
Creatinine, Ser: 1.06 mg/dL (ref 0.50–1.10)
GFR calc Af Amer: 68 mL/min — ABNORMAL LOW (ref >90)
GFR calc Af Amer: 66 mL/min — ABNORMAL LOW (ref >90)
GFR calc non Af Amer: 58 mL/min — ABNORMAL LOW (ref >90)
GFR calc non Af Amer: 57 mL/min — ABNORMAL LOW (ref >90)
GLUCOSE: 117 mg/dL — AB (ref 70–99)
Glucose, Bld: 128 mg/dL — ABNORMAL HIGH (ref 70–99)
Potassium: 4 mEq/L (ref 3.7–5.3)
Potassium: 4.1 mEq/L (ref 3.7–5.3)
Sodium: 139 mEq/L (ref 137–147)
Sodium: 138 mEq/L (ref 137–147)

## 2013-11-27 LAB — HEMOGLOBIN AND HEMATOCRIT, BLOOD
HCT: 24.8 % — ABNORMAL LOW (ref 36.0–46.0)
Hemoglobin: 8.4 g/dL — ABNORMAL LOW (ref 12.0–15.0)

## 2013-11-27 LAB — PROTIME-INR
INR: 1.26 (ref 0.00–1.49)
Prothrombin Time: 15.9 seconds — ABNORMAL HIGH (ref 11.6–15.2)

## 2013-11-27 LAB — CBC
HEMATOCRIT: 24.8 % — AB (ref 36.0–46.0)
HEMOGLOBIN: 8.1 g/dL — AB (ref 12.0–15.0)
MCH: 26.7 pg (ref 26.0–34.0)
MCHC: 32.7 g/dL (ref 30.0–36.0)
MCV: 81.8 fL (ref 78.0–100.0)
PLATELETS: 67 10*3/uL — AB (ref 150–400)
RBC: 3.03 MIL/uL — AB (ref 3.87–5.11)
RDW: 15.6 % — ABNORMAL HIGH (ref 11.5–15.5)
WBC: 2.4 10*3/uL — AB (ref 4.0–10.5)

## 2013-11-27 LAB — APTT: aPTT: 28 seconds (ref 24–37)

## 2013-11-27 LAB — PREPARE RBC (CROSSMATCH)

## 2013-11-27 MED ORDER — SODIUM CHLORIDE 0.9 % IV SOLN
Freq: Once | INTRAVENOUS | Status: AC
  Administered 2013-11-27: 10:00:00 via INTRAVENOUS

## 2013-11-27 MED ORDER — MESALAMINE ER 250 MG PO CPCR: 1000.0000 mg | ORAL_CAPSULE | Freq: Three times a day (TID) | ORAL | Status: DC

## 2013-11-27 MED ORDER — DIPHENHYDRAMINE HCL 25 MG PO CAPS
25.0000 mg | ORAL_CAPSULE | Freq: Once | ORAL | Status: AC
  Administered 2013-11-27: 25 mg via ORAL
  Filled 2013-11-27: qty 1

## 2013-11-27 MED ORDER — ACETAMINOPHEN 325 MG PO TABS
650.0000 mg | ORAL_TABLET | Freq: Once | ORAL | Status: AC
  Administered 2013-11-27: 650 mg via ORAL
  Filled 2013-11-27: qty 2

## 2013-11-27 NOTE — Discharge Summary (Signed)
Physician Discharge Summary  Kathryn [name] OVF:[id number] DOB: 05/21/1955 DOA: 11/25/2013  PCP: [name], MD  Admit date: 11/25/2013 Discharge date: 11/27/2013  Time spent: 40 minutes  Recommendations for Outpatient Follow-up:  1. Repeat cbc in 1 week 2. Follow up with GI as an outpatient 3. Follow up with primary care doctor in 1-2 weeks  Discharge Diagnoses:  Principal Problem:   GI bleeding Active Problems:   Crohn's disease   Hepatic cirrhosis   Hepatitis C antibody test positive   GI bleed   Thrombocytopenia   CKD (chronic kidney disease), stage III   Anemia of chronic disease   Discharge Condition: improved  Diet recommendation: low salt  Filed Weights   11/25/13 1436 11/26/13 0500 11/27/13 0500  Weight: 101.152 kg (223 lb) 102.5 kg (225 lb 15.5 oz) 106.7 kg (235 lb 3.7 oz)    History of present illness:  This is a 58 year old female with history of Crohn's disease who presents to the emergency room with dark blood clots in her stools that began on the morning of admission. She was found to be anemic and required transfusion of PRBC. She was admitted for further evaluation.  Hospital Course:  Patient was transfused with 2 units of PRBCs during her hospital stay. She was seen by GI and underwent capsule study which revealed few small bowel erosions without stigmata of bleeding. She was monitored clinically and her bloody stools or resolved. She is to have brown-colored stools. Her hemoglobin has been stable. It was felt that if she had any recurrent bleeding, she would likely need transfer to [facility] for a small bowel endoscopy. Currently, she has been cleared for discharge by gastroenterology. She is significantly improved and is anxious to discharge home. She is noted to have a thrombocytopenia which may be related to her blood loss. She will need a repeat CBC in one week to ensure that this has resolved. He does not appear to be septic or  toxic. For her history of Crohn's disease, she has been started on Pentasa. She'll follow up with GI in the outpatient setting.  Procedures: Givens capsule study: Few small bowel erosions without stigmata of bleed.  These erosions may indicate mild small bowel Crohn's disease as there is no history of NSAID use.     Consultations:  Gastroenterology  Discharge Exam: Filed Vitals:   11/27/13 1200  BP: 124/63  Pulse: 86  Temp:   Resp: 20    General: NAD Cardiovascular: S1, S2 RRR Respiratory: CTA B  Discharge Instructions You were cared for by a hospitalist during your hospital stay. If you have any questions about your discharge medications or the care you received while you were in the hospital after you are discharged, you can call the unit and asked to speak with the hospitalist on call if the hospitalist that took care of you is not available. Once you are discharged, your primary care physician will handle any further medical issues. Please note that NO REFILLS for any discharge medications will be authorized once you are discharged, as it is imperative that you return to your primary care physician (or establish a relationship with a primary care physician if you do not have one) for your aftercare needs so that they can reassess your need for medications and monitor your lab values.  Discharge Instructions   Call MD for:  extreme fatigue    Complete by:  As directed      Call MD for:  persistant  dizziness or light-headedness    Complete by:  As directed      Call MD for:    Complete by:  As directed   Recurrent bleeding     Diet - low sodium heart healthy    Complete by:  As directed      Increase activity slowly    Complete by:  As directed           Current Discharge Medication List    START taking these medications   Details  mesalamine (PENTASA) 250 MG CR capsule Take 4 capsules (1,000 mg total) by mouth 3 (three) times daily. Qty: 360 capsule, Refills: 0       CONTINUE these medications which have NOT CHANGED   Details  acetaminophen (TYLENOL) 500 MG tablet Take 1,000 mg by mouth 2 (two) times daily as needed for mild pain.     ALPRAZolam (XANAX) 1 MG tablet Take 1 tablet (1 mg total) by mouth at bedtime as needed for sleep. Qty: 30 tablet, Refills: 1    gabapentin (NEURONTIN) 100 MG capsule Take 1 capsule (100 mg total) by mouth 3 (three) times daily. Qty: 270 capsule, Refills: 0    Multiple Vitamins-Minerals (MULTIVITAMIN WITH MINERALS) tablet Take 1 tablet by mouth daily.    nadolol (CORGARD) 20 MG tablet Take 1 tablet (20 mg total) by mouth daily. Qty: 30 tablet, Refills: 3   Associated Diagnoses: Cirrhosis of liver without mention of alcohol    pantoprazole (PROTONIX) 40 MG tablet TAKE 1 TABLET BY MOUTH DAILY Qty: 30 tablet, Refills: 3    traMADol (ULTRAM) 50 MG tablet Take 1 tablet (50 mg total) by mouth 2 (two) times daily as needed for moderate pain. Qty: 30 tablet, Refills: 1       Allergies  Allergen Reactions  . Penicillins Rash   Follow-up Information   Follow up with REHMAN,NAJEEB U, MD. Schedule an appointment as soon as possible for a visit in 2 weeks.   Specialty:  Gastroenterology   Contact information:   [address], [address] [place] [address] [phone]        The results of significant diagnostics from this hospitalization (including imaging, microbiology, ancillary and laboratory) are listed below for reference.    Significant Diagnostic Studies: Nm Gi Blood Loss  11/25/2013   CLINICAL DATA:  Crohn's disease, post colectomy and ileostomy, who abdominal patent and low grade fever for 2 days, blood in ileostomy bag this morning  EXAM: NUCLEAR MEDICINE GASTROINTESTINAL BLEEDING SCAN  TECHNIQUE: Sequential abdominal images were obtained following intravenous administration of Tc-66m labeled red blood cells. Images were obtained for 2 hr. A delayed static images obtained with the lead marker over the  ileostomy.  RADIOPHARMACEUTICALS:  24.5 mCi Tc-58m in-vitro labeled autologous red cells.  COMPARISON:  CT abdomen and pelvis 06/09/2013, MR abdomen 09/12/2013  FINDINGS: Significant enlargement of spleen.  Beginning on initial image, abnormal tracer localization is identified in the RIGHT lower quadrant at the approximate position of the ileostomy.  This is confirmed with a lead marker which shows that the area in question corresponds to the ileostomy.  This focus of abnormal increased red cell localization persists throughout the 2 hr of imaging as well as the following delayed static image.  This accumulation does not peristalse within bowel loops or demonstrate motion.  No accumulation of this tracer into the ostomy bag is identified.  This likely represents red cell localization within a hyperemic segment of small bowel at the ileostomy,  question active Crohn's disease.  This is not felt to represent a site of active GI bleeding due to its fixed position throughout the exam.  No other abnormal sites of tracer accumulation identified.  IMPRESSION: Abnormal accumulation of tracer in the RIGHT lower quadrant at the ileostomy, demonstrating fixed position/no motion of this collection of tagged red cells throughout the period of imaging.  This likely represents a hyperemic distal ileal bowel segment suspicious for active Crohn's disease.  The lack of motion of this tagged red cell collection is atypical for active GI bleeding.  Splenomegaly, prior cross-sectional imaging studies demonstrating cirrhotic features and splenomegaly as well.   Electronically Signed   By: Ulyses Southward M.D.   On: 11/25/2013 12:41    Microbiology: Recent Results (from the past 240 hour(s))  URINE CULTURE     Status: Abnormal   Collection Time    11/21/13 11:55 AM      Result Value Ref Range Status   Urine Culture, Routine Final report (*)  Final   Result 1 Klebsiella pneumoniae (*)  Final   Comment: Greater than 100,000 colony  forming units per mL   ANTIMICROBIAL SUSCEPTIBILITY Comment   Final   Comment:       ** S = Susceptible; I = Intermediate; R = Resistant **                        P = Positive; N = Negative                 MICS are expressed in micrograms per mL        Antibiotic                 RSLT#1    RSLT#2    RSLT#3    RSLT#4     Amoxicillin/Clavulanic Acid    S     Ampicillin                     R     Cefepime                       S     Ceftriaxone                    S     Cefuroxime                     S     Cephalothin                    S     Ciprofloxacin                  S     Ertapenem                      S     Gentamicin                     S     Imipenem                       S     Levofloxacin                   S     Nitrofurantoin  S     Piperacillin                   S     Tetracycline                   S     Tobramycin                     S     Trimethoprim/Sulfa             S  MRSA PCR SCREENING     Status: None   Collection Time    11/25/13 12:45 PM      Result Value Ref Range Status   MRSA by PCR NEGATIVE  NEGATIVE Final   Comment:            The GeneXpert MRSA Assay (FDA     approved for NASAL specimens     only), is one component of a     comprehensive MRSA colonization     surveillance program. It is not     intended to diagnose MRSA     infection nor to guide or     monitor treatment for     MRSA infections.     Labs: Basic Metabolic Panel:  Recent Labs Lab 11/25/13 0704 11/26/13 0739 11/27/13 0438  NA 137 138 139  K 4.4 4.3 4.0  CL 104 104 107  CO2 21 22 23   GLUCOSE 115* 84 117*  BUN 15 15 13   CREATININE 1.30* 1.04 1.04  CALCIUM 9.1 8.7 8.4   Liver Function Tests:  Recent Labs Lab 11/25/13 0704  AST 23  ALT 12  ALKPHOS 146*  BILITOT 0.5  PROT 6.8  ALBUMIN 3.1*   No results found for this basename: LIPASE, AMYLASE,  in the last 168 hours No results found for this basename: AMMONIA,  in the last 168 hours CBC:  Recent  Labs Lab 11/25/13 0704 11/25/13 1416 11/25/13 1755 11/26/13 0145 11/26/13 0743 11/26/13 2149 11/27/13 0438  WBC 4.2 4.0 3.8* 3.3* 3.1*  --  2.4*  NEUTROABS 2.4  --   --   --   --   --   --   HGB 9.5* 8.8* 8.2* 7.8* 8.1* 8.9* 8.1*  HCT 29.1* 27.0* 25.3* 24.2* 24.7* 26.5* 24.8*  MCV 81.3 81.8 81.9 81.5 81.8  --  81.8  PLT 116* 101* 100* PLATELET CLUMPS NOTED ON SMEAR, COUNT APPEARS ADEQUATE 85*  --  67*   Cardiac Enzymes: No results found for this basename: CKTOTAL, CKMB, CKMBINDEX, TROPONINI,  in the last 168 hours BNP: BNP (last 3 results) No results found for this basename: PROBNP,  in the last 8760 hours CBG: No results found for this basename: GLUCAP,  in the last 168 hours     Signed:  Cleotis Sparr  [facility] 11/27/2013, 12:34 PM

## 2013-11-27 NOTE — Progress Notes (Signed)
D.c instructions reviewed with patient.  Verbalized understanding.  Pt dc'd to home with husband. Schonewitz, Candelaria Stagers 11/27/2013

## 2013-11-27 NOTE — ED Notes (Signed)
Colonostomy bag was empty and had about over 100ccs of blood

## 2013-11-27 NOTE — ED Provider Notes (Signed)
CSN: [id number]     Arrival date & time 11/27/13  1720 History   First MD Initiated Contact with Patient 11/27/13 1725     Chief Complaint  Patient presents with  . GI Bleeding     (Consider location/radiation/quality/duration/timing/severity/associated sxs/prior Treatment) HPI Comments: The patient is a 58 year old female with a history of Crohn's disease status post ileostomy placement in 2004 after small bowel resection. The patient has had acute onset of GI bleeding with a significant amount of blood loss through her ileostomy. She was admitted to the hospital in the last several days and underwent a capsule endoscopy during which time it was found that she had several small bowel E. Rozier is no signs of active bleeding. Her stools returned to normal brown color and she was discharged home several hours ago. Upon arriving home she had copious amounts of bloody stool from her ileostomy. Is no putting out large amounts of blood clot, she emptied her ileostomy bag several times prior to arrival in as arty filled up again. She has a small amount of mid abdominal pain, no vomiting, no fevers, symptoms are persistent and severe. She is not on anticoagulation.  The history is provided by the patient.    Past Medical History  Diagnosis Date  . Hypertension   . Enteritis presumed infectious   . Crohn's   . Crohn's   . Anemia   . Hepatitis C antibody test positive   . Hepatitis   . Renal insufficiency   . Cirrhosis   . Splenomegaly    Past Surgical History  Procedure Laterality Date  . Colon surgery      MULTIPLE SURGERIES FOR CROHNS  . Ileostomy      multiple abdominal surgeries for Crohn's by Dr. Gabriel Cirri  . Cholecystectomy    . Abscess drainage      abdominal  . Esophagogastroduodenoscopy  10/05/2010    Procedure: ESOPHAGOGASTRODUODENOSCOPY (EGD);  Surgeon: [name], MD;  [facility]: [facility];  Service: Endoscopy;  Laterality: N/A;  7:30 am  . Cirrhosis    .  Esophagogastroduodenoscopy N/A 09/11/2013    Procedure: ESOPHAGOGASTRODUODENOSCOPY (EGD);  Surgeon: [name], MD;  Location: [facility];  Service: Endoscopy;  Laterality: N/A;  . Ileoscopy  09/11/2013    Procedure: ILEOSCOPY THROUGH STOMA;  Surgeon: [name], MD;  Location: [facility];  Service: Endoscopy;;  . Givens capsule study N/A 09/12/2013    Procedure: GIVENS CAPSULE STUDY;  Surgeon: [name], MD;  Location: [facility];  Service: Endoscopy;  Laterality: N/A;   Family History  Problem Relation Age of Onset  . Cancer Mother   . Stroke Father    History  Substance Use Topics  . Smoking status: Never Smoker   . Smokeless tobacco: Never Used  . Alcohol Use: No   OB History   Grav Para Term Preterm Abortions TAB SAB Ect Mult Living                 Review of Systems  All other systems reviewed and are negative.     Allergies  Penicillins  Home Medications   Prior to Admission medications   Medication Sig Start Date End Date Taking? Authorizing Provider  ALPRAZolam Prudy Feeler) 1 MG tablet Take 1 tablet (1 mg total) by mouth at bedtime as needed for sleep. 08/19/13  Yes Junie Spencer, FNP  gabapentin (NEURONTIN) 100 MG capsule Take 1 capsule (100 mg total) by mouth 3 (three) times daily.  08/27/13  Yes Junie Spencerhristy A Hawks, FNP  mesalamine (PENTASA) 250 MG CR capsule Take 4 capsules (1,000 mg total) by mouth 3 (three) times daily. 11/27/13  Yes Erick BlinksJehanzeb Memon, MD  Multiple Vitamins-Minerals (MULTIVITAMIN WITH MINERALS) tablet Take 1 tablet by mouth daily.   Yes Historical Provider, MD  nadolol (CORGARD) 20 MG tablet Take 1 tablet (20 mg total) by mouth daily. 10/17/13  Yes Len Blalockerri L Setzer, NP  pantoprazole (PROTONIX) 40 MG tablet Take 40 mg by mouth daily.   Yes Historical Provider, MD  acetaminophen (TYLENOL) 500 MG tablet Take 1,000 mg by mouth 2 (two) times daily as needed for mild pain.     Historical Provider, MD   BP 118/79  Pulse 109  Temp(Src) 98.7 F  (37.1 C) (Oral)  Resp 16  Ht 5\' 5"  (1.651 m)  Wt 235 lb (106.595 kg)  BMI 39.11 kg/m2  SpO2 99% Physical Exam  Nursing note and vitals reviewed. Constitutional: She appears well-developed and well-nourished. No distress.  HENT:  Head: Normocephalic and atraumatic.  Mouth/Throat: Oropharynx is clear and moist. No oropharyngeal exudate.  Eyes: Conjunctivae and EOM are normal. Pupils are equal, round, and reactive to light. Right eye exhibits no discharge. Left eye exhibits no discharge. No scleral icterus.  Neck: Normal range of motion. Neck supple. No JVD present. No thyromegaly present.  Cardiovascular: Regular rhythm, normal heart sounds and intact distal pulses.  Exam reveals no gallop and no friction rub.   No murmur heard. Tachycardia to 100  Pulmonary/Chest: Effort normal and breath sounds normal. No respiratory distress. She has no wheezes. She has no rales.  Abdominal: Soft. Bowel sounds are normal. She exhibits no distension and no mass. There is tenderness ( Mild mid abdominal tenderness).  Dark red blood with blood clots coming from the ileostomy  Musculoskeletal: Normal range of motion. She exhibits no edema and no tenderness.  Lymphadenopathy:    She has no cervical adenopathy.  Neurological: She is alert. Coordination normal.  Skin: Skin is warm and dry. No rash noted. No erythema.  Psychiatric: She has a normal mood and affect. Her behavior is normal.    ED Course  Procedures (including critical care time) Labs Review Labs Reviewed  CBC WITH DIFFERENTIAL - Abnormal; Notable for the following:    RBC 3.26 (*)    Hemoglobin 9.0 (*)    HCT 26.9 (*)    Platelets 110 (*)    All other components within normal limits  BASIC METABOLIC PANEL - Abnormal; Notable for the following:    Glucose, Bld 128 (*)    GFR calc non Af Amer 57 (*)    GFR calc Af Amer 66 (*)    All other components within normal limits  PROTIME-INR - Abnormal; Notable for the following:     Prothrombin Time 15.9 (*)    All other components within normal limits  APTT    Imaging Review No results found.    MDM   Final diagnoses:  Lower GI bleed  Anemia, unspecified anemia type    The patient is tachycardic and having ongoing GI bleeding, will discussed with hospitalist, there was some question as to whether the patient needed [facility] should she continue bleeding. She is not hypotensive and is not in distress other than her mild tachycardia. Labs ordered, fluids. IV access ordered.  D/w Dr. Karilyn Cotaehman - [facility],  Pt has  been accepted by the hospitalist Dr. Ninfa Linden at [facility]  Vida Roller, MD 11/27/13 1836

## 2013-11-27 NOTE — Discharge Instructions (Signed)

## 2013-11-27 NOTE — Progress Notes (Signed)
Subjective: Since I last evaluated the patient  States she feels better. Eating breakfast. Wants to go home. Brown stool in colostomy. Has received one unit of PRBCs. Hemoglobin today 8.1.  Underwent Given's Capsule 2 days ago which revealed: .Few small bowel erosions without stigmata of bleed.  These erosions may indicate mild small bowel Crohn's disease as there is no history of NSAID use.  I       Objective: Vital signs in last 24 hours: Temp:  [97.8 F (36.6 C)-99.5 F (37.5 C)] 98.3 F (36.8 C) (10/21 0808) Pulse Rate:  [76-94] 76 (10/21 0700) Resp:  [16-19] 19 (10/21 0700) BP: (111-138)/(61-98) 113/98 mmHg (10/21 0700) SpO2:  [95 %-100 %] 100 % (10/21 0700) Weight:  [235 lb 3.7 oz (106.7 kg)] 235 lb 3.7 oz (106.7 kg) (10/21 0500) Last BM Date: 11/26/13  Intake/Output from previous day: 10/20 0701 - 10/21 0700 In: 3341 [P.O.:720; I.V.:2332; Blood:289] Out: 500 [Urine:500] Intake/Output this shift:    General appearance: alert  Filed Vitals:   11/27/13 0400 11/27/13 0500 11/27/13 0700 11/27/13 0808  BP: 112/67  113/98   Pulse: 76  76   Temp: 97.8 F (36.6 C)   98.3 F (36.8 C)  TempSrc: Oral   Oral  Resp: 18  19   Height:      Weight:  235 lb 3.7 oz (106.7 kg)    SpO2: 97%  100%     Alert. HR regular.  Lungs are clear. BS+. Abdomen is soft. Manson Passey. Brown stool in ileostomy.  No obvious blood in bag.  Lab Results:  Recent Labs  11/26/13 0145 11/26/13 0743 11/26/13 2149 11/27/13 0438  WBC 3.3* 3.1*  --  2.4*  HGB 7.8* 8.1* 8.9* 8.1*  HCT 24.2* 24.7* 26.5* 24.8*  PLT PLATELET CLUMPS NOTED ON SMEAR, COUNT APPEARS ADEQUATE 85*  --  67*   BMET  Recent Labs  11/25/13 0704 11/26/13 0739 11/27/13 0438  NA 137 138 139  K 4.4 4.3 4.0  CL 104 104 107  CO2 21 22 23   GLUCOSE 115* 84 117*  BUN 15 15 13   CREATININE 1.30* 1.04 1.04  CALCIUM 9.1 8.7 8.4   LFT  Recent Labs  11/25/13 0704  PROT 6.8  ALBUMIN 3.1*  AST 23  ALT 12  ALKPHOS 146*  BILITOT  0.5   PT/INR  Recent Labs  11/25/13 0704  LABPROT 15.6*  INR 1.22   Hepatitis Panel No results found for this basename: HEPBSAG, HCVAB, HEPAIGM, HEPBIGM,  in the last 72 hours C-Diff No results found for this basename: CDIFFTOX,  in the last 72 hours No results found for this basename: CDIFFPCR,  in the last 72 hours Fecal Lactopherrin No results found for this basename: FECLLACTOFRN,  in the last 72 hours  Studies/Results: Nm Gi Blood Loss  11/25/2013   CLINICAL DATA:  Crohn's disease, post colectomy and ileostomy, who abdominal patent and low grade fever for 2 days, blood in ileostomy bag this morning  EXAM: NUCLEAR MEDICINE GASTROINTESTINAL BLEEDING SCAN  TECHNIQUE: Sequential abdominal images were obtained following intravenous administration of Tc-5232m labeled red blood cells. Images were obtained for 2 hr. A delayed static images obtained with the lead marker over the ileostomy.  RADIOPHARMACEUTICALS:  24.5 mCi Tc-7232m in-vitro labeled autologous red cells.  COMPARISON:  CT abdomen and pelvis 06/09/2013, MR abdomen 09/12/2013  FINDINGS: Significant enlargement of spleen.  Beginning on initial image, abnormal tracer localization is identified in the RIGHT lower quadrant at the approximate position of  the ileostomy.  This is confirmed with a lead marker which shows that the area in question corresponds to the ileostomy.  This focus of abnormal increased red cell localization persists throughout the 2 hr of imaging as well as the following delayed static image.  This accumulation does not peristalse within bowel loops or demonstrate motion.  No accumulation of this tracer into the ostomy bag is identified.  This likely represents red cell localization within a hyperemic segment of small bowel at the ileostomy, question active Crohn's disease.  This is not felt to represent a site of active GI bleeding due to its fixed position throughout the exam.  No other abnormal sites of tracer accumulation  identified.  IMPRESSION: Abnormal accumulation of tracer in the RIGHT lower quadrant at the ileostomy, demonstrating fixed position/no motion of this collection of tagged red cells throughout the period of imaging.  This likely represents a hyperemic distal ileal bowel segment suspicious for active Crohn's disease.  The lack of motion of this tagged red cell collection is atypical for active GI bleeding.  Splenomegaly, prior cross-sectional imaging studies demonstrating cirrhotic features and splenomegaly as well.   Electronically Signed   By: Ulyses Southward M.D.   On: 11/25/2013 12:41    Medications: I have reviewed the patient's current medications.  Assessment/Plan: Assessment;  Recurrent GI bleed from small bowel.  Location and etiology not identified despite multiple studies.  I have spoken with Dr. Kerry Hough, will transfuse with one unit of blood before discharge. H and H before discharge. If bleeding recurs she will referred to [facility].        LOS: 2 days   SETZER,TERRI W 11/27/2013, 8:19 AM

## 2013-11-27 NOTE — ED Notes (Signed)
Pt reports was discharged from hospital today and shortly afterwards, noticed her ostomy bag was full of blood.  Says filled up 2 more times.  Pt says felt lightheaded and thought was going to pass out.

## 2013-11-27 NOTE — ED Notes (Signed)
Pt reports she received 2 units PRBC this am.

## 2013-11-28 ENCOUNTER — Encounter (HOSPITAL_COMMUNITY): Payer: Self-pay | Admitting: Internal Medicine

## 2013-11-28 ENCOUNTER — Ambulatory Visit (INDEPENDENT_AMBULATORY_CARE_PROVIDER_SITE_OTHER): Payer: Medicare HMO | Admitting: Internal Medicine

## 2013-11-28 LAB — TYPE AND SCREEN
ABO/RH(D): O NEG
Antibody Screen: POSITIVE
DAT, IgG: NEGATIVE
Donor AG Type: NEGATIVE
UNIT DIVISION: 0
Unit division: 0

## 2013-11-29 ENCOUNTER — Other Ambulatory Visit: Payer: Self-pay | Admitting: Nurse Practitioner

## 2013-11-29 MED ORDER — CIPROFLOXACIN HCL 500 MG PO TABS
500.0000 mg | ORAL_TABLET | Freq: Two times a day (BID) | ORAL | Status: DC
Start: 2013-11-29 — End: 2013-12-17

## 2013-12-03 ENCOUNTER — Telehealth (INDEPENDENT_AMBULATORY_CARE_PROVIDER_SITE_OTHER): Payer: Self-pay | Admitting: *Deleted

## 2013-12-03 NOTE — Telephone Encounter (Signed)
Sha returning call to let Dr. [name] know the doctor's name from [facility] is Angelyn Punt. [phone], [phone] and e-mail address: [email or web address]

## 2013-12-04 NOTE — Telephone Encounter (Signed)
Dr.Rehman this is the information that Ms. Kathryn [name] says was to be given to you. Doctor's name from [facility] and his contact information.

## 2013-12-06 NOTE — Telephone Encounter (Signed)
I talk with Dr. Phebe CollaBrogan 2 days ago when he was not on the service anymore and was not able to update me on her condition.

## 2013-12-11 ENCOUNTER — Encounter (HOSPITAL_COMMUNITY): Payer: Self-pay | Admitting: Emergency Medicine

## 2013-12-11 ENCOUNTER — Emergency Department (HOSPITAL_COMMUNITY)
Admission: EM | Admit: 2013-12-11 | Discharge: 2013-12-11 | Disposition: A | Discharge status: Home / Self Care | Payer: Medicare HMO | Attending: Emergency Medicine | Admitting: Emergency Medicine

## 2013-12-11 DIAGNOSIS — Z8619 Personal history of other infectious and parasitic diseases: Secondary | ICD-10-CM | POA: Insufficient documentation

## 2013-12-11 DIAGNOSIS — Z79899 Other long term (current) drug therapy: Secondary | ICD-10-CM | POA: Diagnosis not present

## 2013-12-11 DIAGNOSIS — Z862 Personal history of diseases of the blood and blood-forming organs and certain disorders involving the immune mechanism: Secondary | ICD-10-CM | POA: Diagnosis not present

## 2013-12-11 DIAGNOSIS — K2961 Other gastritis with bleeding: Secondary | ICD-10-CM

## 2013-12-11 DIAGNOSIS — M549 Dorsalgia, unspecified: Secondary | ICD-10-CM | POA: Diagnosis present

## 2013-12-11 DIAGNOSIS — Z9049 Acquired absence of other specified parts of digestive tract: Secondary | ICD-10-CM | POA: Diagnosis not present

## 2013-12-11 DIAGNOSIS — Z792 Long term (current) use of antibiotics: Secondary | ICD-10-CM | POA: Insufficient documentation

## 2013-12-11 DIAGNOSIS — Z87448 Personal history of other diseases of urinary system: Secondary | ICD-10-CM | POA: Insufficient documentation

## 2013-12-11 DIAGNOSIS — Z9889 Other specified postprocedural states: Secondary | ICD-10-CM | POA: Insufficient documentation

## 2013-12-11 DIAGNOSIS — Z880 Allergy status to penicillin: Secondary | ICD-10-CM | POA: Insufficient documentation

## 2013-12-11 DIAGNOSIS — I10 Essential (primary) hypertension: Secondary | ICD-10-CM | POA: Diagnosis not present

## 2013-12-11 DIAGNOSIS — R42 Dizziness and giddiness: Secondary | ICD-10-CM | POA: Diagnosis not present

## 2013-12-11 DIAGNOSIS — N390 Urinary tract infection, site not specified: Secondary | ICD-10-CM

## 2013-12-11 LAB — URINALYSIS, ROUTINE W REFLEX MICROSCOPIC
Bilirubin Urine: NEGATIVE
Glucose, UA: NEGATIVE mg/dL
KETONES UR: NEGATIVE mg/dL
NITRITE: NEGATIVE
Protein, ur: NEGATIVE mg/dL
Specific Gravity, Urine: 1.01 (ref 1.005–1.030)
UROBILINOGEN UA: 0.2 mg/dL (ref 0.0–1.0)
pH: 6 (ref 5.0–8.0)

## 2013-12-11 LAB — BASIC METABOLIC PANEL
Anion gap: 11 (ref 5–15)
BUN: 15 mg/dL (ref 6–23)
CO2: 19 meq/L (ref 19–32)
Calcium: 8.3 mg/dL — ABNORMAL LOW (ref 8.4–10.5)
Chloride: 95 mEq/L — ABNORMAL LOW (ref 96–112)
Creatinine, Ser: 1.39 mg/dL — ABNORMAL HIGH (ref 0.50–1.10)
GFR calc Af Amer: 48 mL/min — ABNORMAL LOW (ref >90)
GFR, EST NON AFRICAN AMERICAN: 41 mL/min — AB (ref >90)
GLUCOSE: 129 mg/dL — AB (ref 70–99)
POTASSIUM: 3.2 meq/L — AB (ref 3.7–5.3)
SODIUM: 125 meq/L — AB (ref 137–147)

## 2013-12-11 LAB — URINE MICROSCOPIC-ADD ON

## 2013-12-11 LAB — CBC WITH DIFFERENTIAL/PLATELET
BASOS ABS: 0 10*3/uL (ref 0.0–0.1)
Basophils Relative: 0 % (ref 0–1)
Eosinophils Absolute: 0.2 10*3/uL (ref 0.0–0.7)
Eosinophils Relative: 3 % (ref 0–5)
HCT: 21 % — ABNORMAL LOW (ref 36.0–46.0)
Hemoglobin: 7 g/dL — ABNORMAL LOW (ref 12.0–15.0)
Lymphocytes Relative: 14 % (ref 12–46)
Lymphs Abs: 0.8 10*3/uL (ref 0.7–4.0)
MCH: 27.3 pg (ref 26.0–34.0)
MCHC: 33.3 g/dL (ref 30.0–36.0)
MCV: 82 fL (ref 78.0–100.0)
Monocytes Absolute: 0.8 10*3/uL (ref 0.1–1.0)
Monocytes Relative: 15 % — ABNORMAL HIGH (ref 3–12)
NEUTROS ABS: 3.8 10*3/uL (ref 1.7–7.7)
Neutrophils Relative %: 69 % (ref 43–77)
PLATELETS: 99 10*3/uL — AB (ref 150–400)
RBC: 2.56 MIL/uL — ABNORMAL LOW (ref 3.87–5.11)
RDW: 16.9 % — AB (ref 11.5–15.5)
WBC: 5.5 10*3/uL (ref 4.0–10.5)

## 2013-12-11 MED ORDER — DIPHENHYDRAMINE HCL 12.5 MG/5ML PO ELIX
12.5000 mg | ORAL_SOLUTION | Freq: Once | ORAL | Status: AC
  Administered 2013-12-11: 12.5 mg via ORAL
  Filled 2013-12-11: qty 5

## 2013-12-11 MED ORDER — CIPROFLOXACIN HCL 250 MG PO TABS
500.0000 mg | ORAL_TABLET | Freq: Once | ORAL | Status: DC
  Filled 2013-12-11: qty 2

## 2013-12-11 MED ORDER — DEXTROSE 5 % IV SOLN
1.0000 g | Freq: Once | INTRAVENOUS | Status: AC
  Administered 2013-12-11: 1 g via INTRAVENOUS
  Filled 2013-12-11: qty 10

## 2013-12-11 NOTE — Discharge Instructions (Signed)
I have discussed your case with the GI specialist at [facility]. Your hemoglobin has dropped from 7.7 to  7.0. They are requesting that you see your GI specialist here in [place] for follow-up as soon as possible. If you notice active bleeding from your ileostomy, or anywhere else, please come to the emergency department at [facility] or the GI specialist here in [place], or come to the [facility] emergency dept..  Your chest also reveals a urinary tract infection, which I believe is the source of your right flank and back area pain. You were given intravenous Rocephin here in the emergency department. Please finish your course of Cipro. Please have your urine rechecked after you have finished your course of Cipro. Urinary Tract Infection Urinary tract infections (UTIs) can develop anywhere along your urinary tract. Your urinary tract is your body's drainage system for removing wastes and extra water. Your urinary tract includes two kidneys, two ureters, a bladder, and a urethra. Your kidneys are a pair of bean-shaped organs. Each kidney is about the size of your fist. They are located below your ribs, one on each side of your spine. CAUSES Infections are caused by microbes, which are microscopic organisms, including fungi, viruses, and bacteria. These organisms are so small that they can only be seen through a microscope. Bacteria are the microbes that most commonly cause UTIs. SYMPTOMS  Symptoms of UTIs may vary by age and gender of the patient and by the location of the infection. Symptoms in young women typically include a frequent and intense urge to urinate and a painful, burning feeling in the bladder or urethra during urination. Older women and men are more likely to be tired, shaky, and weak and have muscle aches and abdominal pain. A fever may mean the infection is in your kidneys. Other symptoms of a kidney infection include pain in your back or  sides below the ribs, nausea, and vomiting. DIAGNOSIS To diagnose a UTI, your caregiver will ask you about your symptoms. Your caregiver also will ask to provide a urine sample. The urine sample will be tested for bacteria and white blood cells. White blood cells are made by your body to help fight infection. TREATMENT  Typically, UTIs can be treated with medication. Because most UTIs are caused by a bacterial infection, they usually can be treated with the use of antibiotics. The choice of antibiotic and length of treatment depend on your symptoms and the type of bacteria causing your infection. HOME CARE INSTRUCTIONS  If you were prescribed antibiotics, take them exactly as your caregiver instructs you. Finish the medication even if you feel better after you have only taken some of the medication.  Drink enough water and fluids to keep your urine clear or pale yellow.  Avoid caffeine, tea, and carbonated beverages. They tend to irritate your bladder.  Empty your bladder often. Avoid holding urine for long periods of time.  Empty your bladder before and after sexual intercourse.  After a bowel movement, women should cleanse from front to back. Use each tissue only once. SEEK MEDICAL CARE IF:   You have back pain.  You develop a fever.  Your symptoms do not begin to resolve within 3 days. SEEK IMMEDIATE MEDICAL CARE IF:   You have severe back pain or lower abdominal pain.  You develop chills.  You have nausea or vomiting.  You have continued burning or discomfort with urination. MAKE SURE YOU:   Understand these instructions.  Will watch your condition.  Will get help right away if you are not doing well or get worse. Document Released: 11/03/2004 Document Revised: 07/26/2011 Document Reviewed: 03/04/2011 [facility] Patient Information 2015 [employer] [place]. This information is not intended to replace advice given to you by your health care provider. Make sure you discuss any  questions you have with your health care provider.

## 2013-12-11 NOTE — ED Notes (Signed)
Pt reports lower back pain, low urine output, and lower back pain,dizziness since Sunday. Pt reports was [facility] after "tips" procedure.

## 2013-12-11 NOTE — ED Provider Notes (Signed)
CSN: [id number]     Arrival date & time 12/11/13  0930 History   First MD Initiated Contact with Patient 12/11/13 [id number]     Chief Complaint  Patient presents with  . Back Pain     (Consider location/radiation/quality/duration/timing/severity/associated sxs/prior Treatment) HPI Comments: The patient is a 58 year old female who presents to the emergency department with a complaint of right lower back pain. The patient states that she recently had a TI PS procedure at [facility]. She was discharged from the hospital approximately 48 hours ago. She states since that time she has noticed decrease in her urination, and having increasing problems with back pain. She denies temperature elevation. She states she has some mild nausea, but she is unsure if the nausea is related to the back pain, or related to the fact that she has dizziness from time to time. She has not had any injury to her back or to her right flank area. The patient states she has an ileostomy and has had good function from the ileostomy. No other significant changes to be reported.  Patient is a 58 y.o. female presenting with back pain. The history is provided by the patient.  Back Pain Associated symptoms: no abdominal pain, no chest pain, no dysuria and no fever     Past Medical History  Diagnosis Date  . Hypertension   . Enteritis presumed infectious   . Crohn's   . Crohn's   . Anemia   . Hepatitis C antibody test positive   . Hepatitis   . Renal insufficiency   . Cirrhosis   . Splenomegaly    Past Surgical History  Procedure Laterality Date  . Colon surgery      MULTIPLE SURGERIES FOR CROHNS  . Ileostomy      multiple abdominal surgeries for Crohn's by Dr. Gabriel Cirri  . Cholecystectomy    . Abscess drainage      abdominal  . Esophagogastroduodenoscopy  10/05/2010    Procedure: ESOPHAGOGASTRODUODENOSCOPY (EGD);  Surgeon: [name], MD;  [facility];  Service: Endoscopy;   Laterality: N/A;  7:30 am  . Cirrhosis    . Esophagogastroduodenoscopy N/A 09/11/2013    Procedure: ESOPHAGOGASTRODUODENOSCOPY (EGD);  Surgeon: [name], MD;  [facility];  Service: Endoscopy;  Laterality: N/A;  . Ileoscopy  09/11/2013    Procedure: ILEOSCOPY THROUGH STOMA;  Surgeon: [name], MD;  [facility];  Service: Endoscopy;;  . Givens capsule study N/A 09/12/2013    Procedure: GIVENS CAPSULE STUDY;  Surgeon: [name], MD;  [facility];  Service: Endoscopy;  Laterality: N/A;  . Givens capsule study N/A 11/25/2013    Procedure: GIVENS CAPSULE STUDY;  Surgeon: [name], MD;  [facility];  Service: Endoscopy;  Laterality: N/A;   Family History  Problem Relation Age of Onset  . Cancer Mother   . Stroke Father    History  Substance Use Topics  . Smoking status: Never Smoker   . Smokeless tobacco: Never Used  . Alcohol Use: No   OB History    No data available     Review of Systems  Constitutional: Positive for activity change, appetite change and fatigue. Negative for fever, chills and diaphoresis.       All ROS Neg except as noted in HPI  Eyes: Negative for photophobia and discharge.  Respiratory: Negative for cough, shortness of breath and wheezing.   Cardiovascular: Negative for chest pain  and palpitations.  Gastrointestinal: Positive for nausea. Negative for abdominal pain and blood in stool.  Genitourinary: Negative for dysuria, frequency and hematuria.  Musculoskeletal: Positive for back pain and arthralgias. Negative for neck pain.  Skin: Negative.   Neurological: Positive for dizziness. Negative for seizures and speech difficulty.  Psychiatric/Behavioral: Negative for hallucinations and confusion.      Allergies  Penicillins  Home Medications   Prior to Admission medications   Medication Sig Start Date End Date Taking? Authorizing Provider  acetaminophen (RA ACETAMINOPHEN) 650 MG CR tablet  Take 1,300 mg by mouth 2 (two) times daily as needed.   Yes Historical Provider, MD  ALPRAZolam Prudy Feeler) 1 MG tablet Take 1 tablet (1 mg total) by mouth at bedtime as needed for sleep. 08/19/13  Yes Junie Spencer, FNP  Cholecalciferol (VITAMIN D-1000 MAX ST) 1000 UNITS tablet Take 1,000 Units by mouth daily.   Yes Historical Provider, MD  ciprofloxacin (CIPRO) 500 MG tablet Take 1 tablet (500 mg total) by mouth 2 (two) times daily. 11/29/13  Yes Mary-Margaret Daphine Deutscher, FNP  gabapentin (NEURONTIN) 100 MG capsule Take 1 capsule (100 mg total) by mouth 3 (three) times daily. 08/27/13  Yes Junie Spencer, FNP  lactulose (CHRONULAC) 10 GM/15ML solution Take 10 g by mouth 3 (three) times daily. 12/07/13 01/06/14 Yes Historical Provider, MD  mesalamine (PENTASA) 250 MG CR capsule Take 4 capsules (1,000 mg total) by mouth 3 (three) times daily. 11/27/13  Yes Erick Blinks, MD  Multiple Vitamins-Minerals (MULTIVITAMIN WITH MINERALS) tablet Take 1 tablet by mouth daily.   Yes Historical Provider, MD  nadolol (CORGARD) 40 MG tablet Take 40 mg by mouth daily. 12/07/13  Yes Historical Provider, MD  pantoprazole (PROTONIX) 40 MG tablet Take 40 mg by mouth daily.   Yes Historical Provider, MD  rifaximin (XIFAXAN) 550 MG TABS tablet Take 550 mg by mouth 2 (two) times daily. 12/07/13  Yes Historical Provider, MD  acetaminophen (TYLENOL) 500 MG tablet Take 1,000 mg by mouth 2 (two) times daily as needed for mild pain.     Historical Provider, MD  nadolol (CORGARD) 20 MG tablet Take 1 tablet (20 mg total) by mouth daily. Patient not taking: Reported on 12/11/2013 10/17/13   Len Blalock, NP   BP 118/80 mmHg  Pulse 65  Temp(Src) 98.3 F (36.8 C) (Oral)  Resp 20  Ht 5\' 5"  (1.651 m)  Wt 230 lb (104.327 kg)  BMI 38.27 kg/m2  SpO2 100% Physical Exam  Constitutional: She is oriented to person, place, and time. She appears well-developed and well-nourished.  Non-toxic appearance.  HENT:  Head: Normocephalic.  Right  Ear: Tympanic membrane and external ear normal.  Left Ear: Tympanic membrane and external ear normal.  Eyes: EOM and lids are normal. Pupils are equal, round, and reactive to light.  Neck: Normal range of motion. Neck supple. Carotid bruit is not present.  Well-healed surgical scar of the left neck from the TI PS procedure.  Cardiovascular: Normal rate, regular rhythm, normal heart sounds, intact distal pulses and normal pulses.   Pulmonary/Chest: Breath sounds normal. No respiratory distress.  Abdominal: Soft. Bowel sounds are normal. There is no tenderness. There is no guarding.  Functioning ileostomy the right lower quadrant of the abdomen. Abdomen is soft, there is mild tenderness in the right upper quadrant. There no fluid waves appreciated. And no evidence of distention. Bowel sounds are present and active.  There is right CVA tenderness present.  Musculoskeletal: Normal range of motion.  There  is right paraspinal area tenderness to palpation. There is no palpable step off of the lumbar spine. There no hot joints appreciated. Radial pulses are 2+, dorsalis pedis pulses are 2+.  Lymphadenopathy:       Head (right side): No submandibular adenopathy present.       Head (left side): No submandibular adenopathy present.    She has no cervical adenopathy.  Neurological: She is alert and oriented to person, place, and time. She has normal strength. No cranial nerve deficit or sensory deficit.  Skin: Skin is warm and dry.  Psychiatric: She has a normal mood and affect. Her speech is normal.  Nursing note and vitals reviewed.   ED Course  Patient reviewed with me by Dr. Rubin PayorPickering.  Procedures (including critical care time) Labs Review Labs Reviewed  URINALYSIS, ROUTINE W REFLEX MICROSCOPIC - Abnormal; Notable for the following:    Hgb urine dipstick TRACE (*)    Leukocytes, UA MODERATE (*)    All other components within normal limits  CBC WITH DIFFERENTIAL - Abnormal; Notable for the  following:    RBC 2.56 (*)    Hemoglobin 7.0 (*)    HCT 21.0 (*)    RDW 16.9 (*)    Platelets 99 (*)    Monocytes Relative 15 (*)    All other components within normal limits  URINE MICROSCOPIC-ADD ON - Abnormal; Notable for the following:    Squamous Epithelial / LPF MANY (*)    Bacteria, UA MANY (*)    All other components within normal limits  URINE CULTURE  BASIC METABOLIC PANEL    Imaging Review No results found.   EKG Interpretation None      MDM  Vital signs are within normal limits.pulse oximetry is 100% on room air. Within normal limits by my interpretation. Urinalysis shows a trace of hemoglobin, moderate leukocyte esterase, too many to count white blood cells, and many bacteria. Complete blood count shows a hemoglobin to be down to 7. It was 9 approximately 2 weeks ago. Hematocrit is 21 which is also low. Platelets are 99,000, low.  Urine culture sent to the lab. Patient started on Cipro.  Pt seen with me by Dr Rubin PayorPickering. [facility] [place] We reviewed the chart and all findings. Pt is stable at this time. Review of their records reveal pt's hgb was only 7.7 upon discharge. The feel pt can be discharged home, but have appointment with Dr Karilyn Cotaehman (GI) this week for recheck.  Pt [facility] and need for consult with Dr Karilyn Cotaehman. Pt in agreement with plan.  [facility] . IV rocephin given in ED. Pt to finish the cipro while cultures are being analysised. [facility] or problem or concern.   Final diagnoses:  UTI (lower urinary tract infection)  Gastrointestinal hemorrhage associated with other gastritis    *I have reviewed nursing notes, vital signs, and all appropriate lab and imaging results for this patient.**    Kathie DikeHobson M Mariska Daffin, PA-C 12/13/13 1253  Juliet RudeNathan R. Rubin PayorPickering, MD 12/13/13 2153

## 2013-12-12 ENCOUNTER — Telehealth (INDEPENDENT_AMBULATORY_CARE_PROVIDER_SITE_OTHER): Payer: Self-pay | Admitting: *Deleted

## 2013-12-12 LAB — URINE CULTURE: Colony Count: >=100000

## 2013-12-12 NOTE — Telephone Encounter (Signed)
Kathryn [name] said her nurse case manager called and advised her to call our office. Her hemoglobin is a 7 and she is short of breath. The return phone number is [phone].

## 2013-12-12 NOTE — Telephone Encounter (Signed)
I have addressed this.

## 2013-12-13 ENCOUNTER — Telehealth (INDEPENDENT_AMBULATORY_CARE_PROVIDER_SITE_OTHER): Payer: Self-pay | Admitting: Internal Medicine

## 2013-12-13 DIAGNOSIS — D62 Acute posthemorrhagic anemia: Secondary | ICD-10-CM

## 2013-12-13 NOTE — Telephone Encounter (Signed)
Kathryn [name] says she feels a little bit better. Hemoglobin up to 7.7 at [facility]. She was told she did not need a blood transfusion.  She has an OV Tuesday 12/17/2013. Will go ahead and order a CBC on her.

## 2013-12-17 ENCOUNTER — Other Ambulatory Visit: Payer: Self-pay | Admitting: *Deleted

## 2013-12-17 ENCOUNTER — Encounter (INDEPENDENT_AMBULATORY_CARE_PROVIDER_SITE_OTHER): Payer: Self-pay | Admitting: Internal Medicine

## 2013-12-17 ENCOUNTER — Ambulatory Visit (INDEPENDENT_AMBULATORY_CARE_PROVIDER_SITE_OTHER): Payer: Medicare HMO | Admitting: Internal Medicine

## 2013-12-17 VITALS — BP 112/58 | HR 64 | Resp 99 | Ht 65.5 in | Wt 239.7 lb

## 2013-12-17 DIAGNOSIS — K760 Fatty (change of) liver, not elsewhere classified: Secondary | ICD-10-CM

## 2013-12-17 DIAGNOSIS — B1920 Unspecified viral hepatitis C without hepatic coma: Secondary | ICD-10-CM

## 2013-12-17 MED ORDER — ALPRAZOLAM 1 MG PO TABS: 1.0000 mg | ORAL_TABLET | Freq: Every evening | ORAL | Status: DC | PRN

## 2013-12-17 NOTE — Patient Instructions (Signed)
OV in 6 weeks. Labs today.

## 2013-12-17 NOTE — Progress Notes (Signed)
Subjective:    Patient ID: Kathryn [name], female    DOB: [birth year], 58 y.o.   MRN: [id number]  HPI  Here today for f/u. Hx of Crohn disease requiring multiple surgeries and now has an ileostomy, She wasd diangosed in 1985. In 2006 she had a subtotal colectomy with permanent ileostomy. She has a  hx of cirrhosis secondary to chronic Hepatitis C. She has completed antiviral therapy the last aof August. She has cleared the virus. [facility]  for GI bleed. She received 3 units of blood and her workup was negative. She had an EGD reveal portal gastropathy, normal terminal ileoscopy and small bowel given caplsule did not reveal source of bleeding.  She was referred to [facility]. While at [facility] she had another GI bleed. She underwent a TIPS procedure.  12/11/2013 Underwent a transjugular intrahepatic post systemic shunt.    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 Today she says she feels weak.  She feel lifeless. Her appetite is okay.  She has gained about 16 pounds. She has edema to her lower extremities.  Weight in October 223. Today her weight is 239.7. She is emptying her bag about 10 times a day. No blood. She says she is intolerant of diuretics.  12/12/2013 Albumin 3.1, Total bili 1.3, ALP 145, AST 85, ALT 82.  H and H 7.7 and 24.3.   11/26/2013 Given Capsule:  Summary & Recommendations: Few small bowel erosions without stigmata of bleed. These erosions may indicate mild small bowel Crohn's disease as there is no history of NSAID use. Will start patient on Pentasa 1 g by mouth 3 times a day. If bleeding recurs she will need to be transferred to [facility] for small bowel endoscopy  10/01/2013. Echocardiography was obtained in LV function was normal (60-65). Patient's symptoms have resolved with improvement in H&H.  09/12/2013 MR abdomen with/without contrast:  MPRESSION: 1. Morphologic features of the liver compatible  with cirrhosis. 2. Stigmata of portal venous hypertension with marked splenomegaly.    Patient underwent EGD on August 2012 for melena and anemia and found to have multiple gastric erosions. Biopsy was negative for H. pylori infection no granulomas were seen to suggest gastric Crohn's disease.     Review of Systems Past Medical History  Diagnosis Date  . Hypertension   . Enteritis presumed infectious   . Crohn's   . Crohn's   . Anemia   . Hepatitis C antibody test positive   . Hepatitis   . Renal insufficiency   . Cirrhosis   . Splenomegaly     Past Surgical History  Procedure Laterality Date  . Colon surgery      MULTIPLE SURGERIES FOR CROHNS  . Ileostomy      multiple abdominal surgeries for Crohn's by Dr. Gabriel Cirri  . Cholecystectomy    . Abscess drainage      abdominal  . Esophagogastroduodenoscopy  10/05/2010    Procedure: ESOPHAGOGASTRODUODENOSCOPY (EGD);  Surgeon: [name], MD;  [facility];  Service: Endoscopy;  Laterality: N/A;  7:30 am  . Cirrhosis    . Esophagogastroduodenoscopy N/A 09/11/2013    Procedure: ESOPHAGOGASTRODUODENOSCOPY (EGD);  Surgeon: [name], MD;  [facility];  Service: Endoscopy;  Laterality: N/A;  . Ileoscopy  09/11/2013    Procedure: ILEOSCOPY THROUGH STOMA;  Surgeon: [name], MD;  [facility];  Service: Endoscopy;;  . Givens capsule study  N/A 09/12/2013    Procedure: GIVENS CAPSULE STUDY;  Surgeon: Malissa HippoNajeeb U Rehman, MD;  [facility];  Service: Endoscopy;  Laterality: N/A;  . Givens capsule study N/A 11/25/2013    Procedure: GIVENS CAPSULE STUDY;  Surgeon: Malissa HippoNajeeb U Rehman, MD;  [facility];  Service: Endoscopy;  Laterality: N/A;    Allergies  Allergen Reactions  . Penicillins Rash    Current Outpatient Prescriptions on File Prior to Visit  Medication Sig Dispense Refill  . acetaminophen (RA ACETAMINOPHEN) 650 MG CR tablet Take 1,300 mg by mouth 2 (two) times  daily as needed.    . Cholecalciferol (VITAMIN D-1000 MAX ST) 1000 UNITS tablet Take 1,000 Units by mouth daily.    Marland Kitchen. gabapentin (NEURONTIN) 100 MG capsule Take 1 capsule (100 mg total) by mouth 3 (three) times daily. 270 capsule 0  . lactulose (CHRONULAC) 10 GM/15ML solution Take 10 g by mouth 3 (three) times daily.    . mesalamine (PENTASA) 250 MG CR capsule Take 4 capsules (1,000 mg total) by mouth 3 (three) times daily. 360 capsule 0  . Multiple Vitamins-Minerals (MULTIVITAMIN WITH MINERALS) tablet Take 1 tablet by mouth daily.    . nadolol (CORGARD) 40 MG tablet Take 40 mg by mouth daily.    . pantoprazole (PROTONIX) 40 MG tablet Take 40 mg by mouth daily.    . rifaximin (XIFAXAN) 550 MG TABS tablet Take 550 mg by mouth 2 (two) times daily.     No current facility-administered medications on file prior to visit.        Objective:   Physical Exam There were no vitals filed for this visit.  Filed Vitals:   12/17/13 1427  Height: 5' 5.5" (1.664 m)  Weight: 239 lb 11.2 oz (108.727 kg)   Alert and oriented. Skin warm and dry. Oral mucosa is moist.   . Sclera anicteric, conjunctivae is pink. Her color is pale. Thyroid not enlarged. No cervical lymphadenopathy. Lungs clear. Heart regular rate and rhythm.  Abdomen is soft. Bowel sounds are positive. No hepatomegaly. No abdominal masses felt. No tenderness.  2+ edema to lower extremities.  Ileostomy bag draining yellow liquid stool.      Assessment & Plan:  Hepatitis C: She has cleared the virus. GI bleed from varices to stoma. [facility]). No further bleeding. CBC, CMET, Hep C quaint, AFP OV 6 weeks.

## 2013-12-17 NOTE — Telephone Encounter (Signed)
Called in.

## 2013-12-17 NOTE — Telephone Encounter (Signed)
Please call in xanax with 1 refills 

## 2013-12-17 NOTE — Telephone Encounter (Signed)
Last ov 10/15. Last refill 11/15/13. If approved call to [facility], [phone]. Route to nurse pool.

## 2013-12-17 NOTE — Telephone Encounter (Signed)
We have never rx this according to epic- where did she get original rx?

## 2013-12-17 NOTE — Telephone Encounter (Signed)
Last ov 10/15. This was not on current med list but last refill from [facility] was 11/15/13. Route to nurse pool. If approved print RX and call pt to pickup.

## 2013-12-18 ENCOUNTER — Telehealth (INDEPENDENT_AMBULATORY_CARE_PROVIDER_SITE_OTHER): Payer: Self-pay | Admitting: Internal Medicine

## 2013-12-18 ENCOUNTER — Telehealth: Payer: Self-pay | Admitting: *Deleted

## 2013-12-18 LAB — COMPREHENSIVE METABOLIC PANEL
ALBUMIN: 2.8 g/dL — AB (ref 3.5–5.2)
ALT: 36 U/L — AB (ref 0–35)
AST: 33 U/L (ref 0–37)
Alkaline Phosphatase: 185 U/L — ABNORMAL HIGH (ref 39–117)
BUN: 17 mg/dL (ref 6–23)
CALCIUM: 8.3 mg/dL — AB (ref 8.4–10.5)
CHLORIDE: 94 meq/L — AB (ref 96–112)
CO2: 19 meq/L (ref 19–32)
Creat: 1.13 mg/dL — ABNORMAL HIGH (ref 0.50–1.10)
Glucose, Bld: 218 mg/dL — ABNORMAL HIGH (ref 70–99)
Potassium: 3.2 mEq/L — ABNORMAL LOW (ref 3.5–5.3)
Sodium: 124 mEq/L — ABNORMAL LOW (ref 135–145)
Total Bilirubin: 1.3 mg/dL — ABNORMAL HIGH (ref 0.2–1.2)
Total Protein: 5.3 g/dL — ABNORMAL LOW (ref 6.0–8.3)

## 2013-12-18 LAB — CBC
HEMATOCRIT: 21.7 % — AB (ref 36.0–46.0)
Hemoglobin: 7.2 g/dL — ABNORMAL LOW (ref 12.0–15.0)
MCH: 26.9 pg (ref 26.0–34.0)
MCHC: 33.2 g/dL (ref 30.0–36.0)
MCV: 81 fL (ref 78.0–100.0)
PLATELETS: 111 10*3/uL — AB (ref 150–400)
RBC: 2.68 MIL/uL — AB (ref 3.87–5.11)
RDW: 16.8 % — ABNORMAL HIGH (ref 11.5–15.5)
WBC: 5.2 10*3/uL (ref 4.0–10.5)

## 2013-12-18 LAB — AFP TUMOR MARKER: AFP-Tumor Marker: 5 ng/mL (ref <6.1)

## 2013-12-18 NOTE — Telephone Encounter (Signed)
According to chart patien got oxycodone filled on 12/07/13 by someone else. Tramadol refill denied

## 2013-12-18 NOTE — Telephone Encounter (Signed)
Ms Lendon Colonel gave patient Tramadol 50 mg 1 bid prn for moderate pain Qty 30 with 1 refill back on Sept. 15.  [facility] wants to know if you will refill.

## 2013-12-18 NOTE — Telephone Encounter (Signed)
Aware,Tramadol script denied.

## 2013-12-18 NOTE — Telephone Encounter (Signed)
Kathryn [name], [place] to 2units of PRBCs.

## 2013-12-18 NOTE — Telephone Encounter (Signed)
Kathryn [name], Kathryn [name] needs 2 units of PRBCs.

## 2013-12-18 NOTE — Telephone Encounter (Signed)
The paper work to include PA for the blood transfusion has been completed and faxed to [facility]. They will contact the patient.

## 2013-12-19 ENCOUNTER — Encounter (HOSPITAL_COMMUNITY)
Admission: RE | Admit: 2013-12-19 | Discharge: 2013-12-19 | Disposition: A | Discharge status: Home / Self Care | Payer: Medicare HMO | Source: Ambulatory Visit | Attending: Internal Medicine | Admitting: Internal Medicine

## 2013-12-19 ENCOUNTER — Encounter (HOSPITAL_COMMUNITY): Payer: Medicare HMO

## 2013-12-19 ENCOUNTER — Encounter (HOSPITAL_COMMUNITY): Payer: Self-pay

## 2013-12-19 DIAGNOSIS — K922 Gastrointestinal hemorrhage, unspecified: Secondary | ICD-10-CM | POA: Diagnosis not present

## 2013-12-19 DIAGNOSIS — D62 Acute posthemorrhagic anemia: Secondary | ICD-10-CM | POA: Diagnosis not present

## 2013-12-19 LAB — HEPATITIS C RNA QUANTITATIVE: HCV Quantitative: NOT DETECTED IU/mL (ref <15)

## 2013-12-19 LAB — HEMOGLOBIN AND HEMATOCRIT, BLOOD
HCT: 22.5 % — ABNORMAL LOW (ref 36.0–46.0)
Hemoglobin: 7.6 g/dL — ABNORMAL LOW (ref 12.0–15.0)

## 2013-12-19 LAB — PREPARE RBC (CROSSMATCH)

## 2013-12-19 NOTE — Progress Notes (Signed)
Results for CLERA, NEESON (MRN [id number]) as of 12/19/2013 14:00  H&H drawn for 2 units of PC to be given in the am  Ref. Range 12/19/2013 12:55  Hemoglobin Latest Range: 12.0-15.0 g/dL 7.6 (L)  HCT Latest Range: 36.0-46.0 % 22.5 (L)

## 2013-12-20 ENCOUNTER — Encounter (HOSPITAL_COMMUNITY): Payer: Self-pay

## 2013-12-20 ENCOUNTER — Encounter (HOSPITAL_COMMUNITY)
Admission: RE | Admit: 2013-12-20 | Discharge: 2013-12-20 | Disposition: A | Discharge status: Home / Self Care | Payer: Medicare HMO | Source: Ambulatory Visit | Attending: Internal Medicine | Admitting: Internal Medicine

## 2013-12-20 DIAGNOSIS — D62 Acute posthemorrhagic anemia: Secondary | ICD-10-CM | POA: Diagnosis not present

## 2013-12-20 LAB — HEMOGLOBIN AND HEMATOCRIT, BLOOD
HCT: 27.4 % — ABNORMAL LOW (ref 36.0–46.0)
Hemoglobin: 9.2 g/dL — ABNORMAL LOW (ref 12.0–15.0)

## 2013-12-20 MED ORDER — DIPHENHYDRAMINE HCL 25 MG PO CAPS
25.0000 mg | ORAL_CAPSULE | Freq: Once | ORAL | Status: AC
  Administered 2013-12-20: 25 mg via ORAL

## 2013-12-20 MED ORDER — ACETAMINOPHEN 325 MG PO TABS
ORAL_TABLET | ORAL | Status: AC
  Filled 2013-12-20: qty 2

## 2013-12-20 MED ORDER — ACETAMINOPHEN 325 MG PO TABS
650.0000 mg | ORAL_TABLET | Freq: Once | ORAL | Status: AC
  Administered 2013-12-20: 650 mg via ORAL

## 2013-12-20 MED ORDER — SODIUM CHLORIDE 0.9 % IV SOLN
Freq: Once | INTRAVENOUS | Status: AC
  Administered 2013-12-20: 08:00:00 via INTRAVENOUS

## 2013-12-20 MED ORDER — DIPHENHYDRAMINE HCL 25 MG PO CAPS
ORAL_CAPSULE | ORAL | Status: AC
  Filled 2013-12-20: qty 1

## 2013-12-20 NOTE — Progress Notes (Signed)
30 min post transfusion hh results faxed to Dr Karilyn Cotaehman

## 2013-12-21 LAB — TYPE AND SCREEN
ABO/RH(D): O NEG
ANTIBODY SCREEN: POSITIVE
DAT, IGG: NEGATIVE
DONOR AG TYPE: NEGATIVE
Donor AG Type: NEGATIVE
UNIT DIVISION: 0
Unit division: 0

## 2013-12-23 ENCOUNTER — Encounter: Payer: Self-pay | Admitting: Family Medicine

## 2013-12-23 ENCOUNTER — Ambulatory Visit (INDEPENDENT_AMBULATORY_CARE_PROVIDER_SITE_OTHER): Payer: Medicare HMO | Admitting: Family Medicine

## 2013-12-23 VITALS — BP 107/60 | HR 60 | Temp 96.5°F | Ht 65.5 in | Wt 233.4 lb

## 2013-12-23 DIAGNOSIS — D62 Acute posthemorrhagic anemia: Secondary | ICD-10-CM

## 2013-12-23 NOTE — Progress Notes (Signed)
   Subjective:    Patient ID: [name], female    DOB: [birth year], 58 y.o.   MRN: [id number]  HPI Patient was recently seen by GI and given 2 units prbc's for anemia. She had hgb of 7.7 and post infusion a few days ago showed 9.4.  She feels a lot better.  She has hx of chron's and ileostomy and she has cirrhosis and had some bleeding varicies in her ileum and this was ablated by GI and she is no longer actively bleeding.  She is not on iron.  She feels weak.  Her GI asked her to follow up with her PCP.  Review of Systems  Constitutional: Positive for fatigue. Negative for fever.  HENT: Negative for ear pain.   Eyes: Negative for discharge.  Respiratory: Negative for cough.   Cardiovascular: Negative for chest pain.  Gastrointestinal: Negative for abdominal distention.  Endocrine: Negative for polyuria.  Genitourinary: Negative for difficulty urinating.  Musculoskeletal: Negative for gait problem and neck pain.  Skin: Negative for color change and rash.  Neurological: Negative for speech difficulty and headaches.  Psychiatric/Behavioral: Negative for agitation.       Objective:    BP 107/60 mmHg  Pulse 60  Temp(Src) 96.5 F (35.8 C) (Oral)  Ht 5' 5.5" (1.664 m)  Wt 233 lb 6.4 oz (105.87 kg)  BMI 38.24 kg/m2 Physical Exam  Constitutional: She is oriented to person, place, and time. She appears well-developed and well-nourished.  HENT:  Head: Normocephalic and atraumatic.  Mouth/Throat: Oropharynx is clear and moist.  Eyes: Pupils are equal, round, and reactive to light.  Neck: Normal range of motion. Neck supple.  Cardiovascular: Normal rate and regular rhythm.   No murmur heard. Pulmonary/Chest: Effort normal and breath sounds normal.  Abdominal: Soft. Bowel sounds are normal. There is no tenderness.  Neurological: She is alert and oriented to person, place, and time.  Skin: Skin is warm and dry.  Psychiatric: She has a normal mood and affect.            Assessment & Plan:     ICD-9-CM ICD-10-CM   1. Anemia, posthemorrhagic, acute 285.1 D62 CBC With differential/Platelet     Ambulatory referral to Hematology     CANCELED: POCT CBC   Referral to Hematology due cirrhosis, chron's, and hx of intolerance to iron.  Return if symptoms worsen or fail to improve.  Deatra Canter FNP

## 2013-12-24 LAB — CBC WITH DIFFERENTIAL
Basophils Absolute: 0 10*3/uL (ref 0.0–0.2)
Basos: 0 %
Eos: 1 %
Eosinophils Absolute: 0.1 10*3/uL (ref 0.0–0.4)
HCT: 30.5 % — ABNORMAL LOW (ref 34.0–46.6)
Hemoglobin: 9.9 g/dL — ABNORMAL LOW (ref 11.1–15.9)
Immature Grans (Abs): 0 10*3/uL (ref 0.0–0.1)
Immature Granulocytes: 0 %
Lymphocytes Absolute: 0.9 10*3/uL (ref 0.7–3.1)
Lymphs: 20 %
MCH: 26.4 pg — ABNORMAL LOW (ref 26.6–33.0)
MCHC: 32.5 g/dL (ref 31.5–35.7)
MCV: 81 fL (ref 79–97)
Monocytes Absolute: 0.3 10*3/uL (ref 0.1–0.9)
Monocytes: 7 %
Neutrophils Absolute: 3.3 10*3/uL (ref 1.4–7.0)
Neutrophils Relative %: 72 %
Platelets: 91 10*3/uL — CL (ref 150–379)
RBC: 3.75 x10E6/uL — ABNORMAL LOW (ref 3.77–5.28)
RDW: 16.3 % — ABNORMAL HIGH (ref 12.3–15.4)
WBC: 4.7 10*3/uL (ref 3.4–10.8)

## 2014-01-01 ENCOUNTER — Telehealth (INDEPENDENT_AMBULATORY_CARE_PROVIDER_SITE_OTHER): Payer: Self-pay | Admitting: Internal Medicine

## 2014-01-01 NOTE — Telephone Encounter (Signed)
Nees OV in the next month or two

## 2014-01-07 DIAGNOSIS — R600 Localized edema: Secondary | ICD-10-CM

## 2014-01-07 HISTORY — DX: Localized edema: R60.0

## 2014-01-09 ENCOUNTER — Encounter (HOSPITAL_COMMUNITY): Payer: Self-pay

## 2014-01-09 ENCOUNTER — Encounter (HOSPITAL_COMMUNITY): Payer: Medicare HMO | Attending: Hematology and Oncology

## 2014-01-09 ENCOUNTER — Encounter (INDEPENDENT_AMBULATORY_CARE_PROVIDER_SITE_OTHER): Payer: Self-pay | Admitting: Internal Medicine

## 2014-01-09 VITALS — BP 117/46 | HR 76 | Temp 98.6°F | Resp 18 | Ht 64.5 in | Wt 248.1 lb

## 2014-01-09 DIAGNOSIS — K7460 Unspecified cirrhosis of liver: Secondary | ICD-10-CM

## 2014-01-09 DIAGNOSIS — D500 Iron deficiency anemia secondary to blood loss (chronic): Secondary | ICD-10-CM

## 2014-01-09 DIAGNOSIS — D62 Acute posthemorrhagic anemia: Secondary | ICD-10-CM | POA: Diagnosis not present

## 2014-01-09 DIAGNOSIS — D61818 Other pancytopenia: Secondary | ICD-10-CM

## 2014-01-09 DIAGNOSIS — K769 Liver disease, unspecified: Secondary | ICD-10-CM

## 2014-01-09 DIAGNOSIS — K922 Gastrointestinal hemorrhage, unspecified: Secondary | ICD-10-CM | POA: Insufficient documentation

## 2014-01-09 DIAGNOSIS — D731 Hypersplenism: Secondary | ICD-10-CM

## 2014-01-09 DIAGNOSIS — K766 Portal hypertension: Secondary | ICD-10-CM

## 2014-01-09 DIAGNOSIS — K909 Intestinal malabsorption, unspecified: Secondary | ICD-10-CM

## 2014-01-09 DIAGNOSIS — E611 Iron deficiency: Secondary | ICD-10-CM | POA: Insufficient documentation

## 2014-01-09 DIAGNOSIS — K5090 Crohn's disease, unspecified, without complications: Secondary | ICD-10-CM

## 2014-01-09 DIAGNOSIS — K50919 Crohn's disease, unspecified, with unspecified complications: Secondary | ICD-10-CM

## 2014-01-09 LAB — COMPREHENSIVE METABOLIC PANEL
ALK PHOS: 181 U/L — AB (ref 39–117)
ALT: 16 U/L (ref 0–35)
ANION GAP: 11 (ref 5–15)
AST: 33 U/L (ref 0–37)
Albumin: 2.6 g/dL — ABNORMAL LOW (ref 3.5–5.2)
BILIRUBIN TOTAL: 1.5 mg/dL — AB (ref 0.3–1.2)
BUN: 13 mg/dL (ref 6–23)
CHLORIDE: 101 meq/L (ref 96–112)
CO2: 22 meq/L (ref 19–32)
Calcium: 8.8 mg/dL (ref 8.4–10.5)
Creatinine, Ser: 1.21 mg/dL — ABNORMAL HIGH (ref 0.50–1.10)
GFR, EST AFRICAN AMERICAN: 56 mL/min — AB (ref >90)
GFR, EST NON AFRICAN AMERICAN: 49 mL/min — AB (ref >90)
GLUCOSE: 193 mg/dL — AB (ref 70–99)
POTASSIUM: 3.3 meq/L — AB (ref 3.7–5.3)
Sodium: 134 mEq/L — ABNORMAL LOW (ref 137–147)
Total Protein: 6 g/dL (ref 6.0–8.3)

## 2014-01-09 LAB — CBC WITH DIFFERENTIAL/PLATELET
Basophils Absolute: 0 10*3/uL (ref 0.0–0.1)
Basophils Relative: 1 % (ref 0–1)
EOS ABS: 0.2 10*3/uL (ref 0.0–0.7)
Eosinophils Relative: 6 % — ABNORMAL HIGH (ref 0–5)
HCT: 29.2 % — ABNORMAL LOW (ref 36.0–46.0)
HEMOGLOBIN: 9.5 g/dL — AB (ref 12.0–15.0)
LYMPHS ABS: 0.9 10*3/uL (ref 0.7–4.0)
Lymphocytes Relative: 33 % (ref 12–46)
MCH: 28.1 pg (ref 26.0–34.0)
MCHC: 32.5 g/dL (ref 30.0–36.0)
MCV: 86.4 fL (ref 78.0–100.0)
MONO ABS: 0.3 10*3/uL (ref 0.1–1.0)
MONOS PCT: 10 % (ref 3–12)
Neutro Abs: 1.4 10*3/uL — ABNORMAL LOW (ref 1.7–7.7)
Neutrophils Relative %: 51 % (ref 43–77)
Platelets: 93 10*3/uL — ABNORMAL LOW (ref 150–400)
RBC: 3.38 MIL/uL — AB (ref 3.87–5.11)
RDW: 20 % — ABNORMAL HIGH (ref 11.5–15.5)
WBC: 2.8 10*3/uL — ABNORMAL LOW (ref 4.0–10.5)

## 2014-01-09 LAB — VITAMIN B12: Vitamin B-12: 711 pg/mL (ref 211–911)

## 2014-01-09 LAB — IRON AND TIBC
Iron: 150 ug/dL — ABNORMAL HIGH (ref 42–135)
SATURATION RATIOS: 34 % (ref 20–55)
TIBC: 435 ug/dL (ref 250–470)
UIBC: 285 ug/dL (ref 125–400)

## 2014-01-09 LAB — FERRITIN: Ferritin: 24 ng/mL (ref 10–291)

## 2014-01-09 LAB — RETICULOCYTES
RBC.: 3.38 MIL/uL — ABNORMAL LOW (ref 3.87–5.11)
Retic Count, Absolute: 108.2 10*3/uL (ref 19.0–186.0)
Retic Ct Pct: 3.2 % — ABNORMAL HIGH (ref 0.4–3.1)

## 2014-01-09 LAB — FOLATE

## 2014-01-09 LAB — LACTATE DEHYDROGENASE: LDH: 272 U/L — ABNORMAL HIGH (ref 94–250)

## 2014-01-09 NOTE — Telephone Encounter (Signed)
Patient has an apt scheduled for 01/28/14 with Dorene Arerri Setzer, NP.

## 2014-01-09 NOTE — Patient Instructions (Signed)
[name]  RECOMMENDATIONS MADE BY THE CONSULTANT AND ANY TEST RESULTS WILL BE SENT TO YOUR REFERRING PHYSICIAN.  EXAM FINDINGS BY THE PHYSICIAN TODAY AND SIGNS OR SYMPTOMS TO REPORT TO CLINIC OR PRIMARY PHYSICIAN: Exam and findings as discussed by Dr. Zigmund Daniel.  We will check some labs today, get you scheduled for feraheme infusions and see you back in 6 weeks.  If we need to do anything different based upon your lab findings we will contact you.    INSTRUCTIONS/FOLLOW-UP: Feraheme infusion on 12/7 & 12/14 Lab and office visit in 6 weeks.  Thank you for choosing [name] to provide your oncology and hematology care.  To afford each patient quality time with our providers, please arrive at least 15 minutes before your scheduled appointment time.  With your help, our goal is to use those 15 minutes to complete the necessary work-up to ensure our physicians have the information they need to help with your evaluation and healthcare recommendations.    Effective January 1st, 2014, we ask that you re-schedule your appointment with our physicians should you arrive 10 or more minutes late for your appointment.  We strive to give you quality time with our providers, and arriving late affects you and other patients whose appointments are after yours.    Again, thank you for choosing [name].  Our hope is that these requests will decrease the amount of time that you wait before being seen by our physicians.       _____________________________________________________________  Should you have questions after your visit to [facility], please contact our office at [phone] between the hours of 8:30 a.m. and 4:30 p.m.  Voicemails left after 4:30 p.m. will not be returned until the following business day.  For prescription refill requests, have your pharmacy contact our office with your prescription refill  request.    _______________________________________________________________  We hope that we have given you very good care.  You may receive a patient satisfaction survey in the mail, please complete it and return it as soon as possible.  We value your feedback!  _______________________________________________________________  Have you asked about our STAR program?  STAR stands for Survivorship Training and Rehabilitation, and this is a nationally recognized cancer care program that focuses on survivorship and rehabilitation.  Cancer and cancer treatments may cause problems, such as, pain, making you feel tired and keeping you from doing the things that you need or want to do. Cancer rehabilitation can help. Our goal is to reduce these troubling effects and help you have the best quality of life possible.  You may receive a survey from a nurse that asks questions about your current state of health.  Based on the survey results, all eligible patients will be referred to the [facility] program for an evaluation so we can better serve you!  A frequently asked questions sheet is available upon request.  Ferumoxytol injection What is this medicine? FERUMOXYTOL is an iron complex. Iron is used to make healthy red blood cells, which carry oxygen and nutrients throughout the body. This medicine is used to treat iron deficiency anemia in people with chronic kidney disease. This medicine may be used for other purposes; ask your health care provider or pharmacist if you have questions. COMMON BRAND NAME(S): Feraheme What should I tell my health care provider before I take this medicine? They need to know if you have any of these conditions: -anemia not  caused by low iron levels -high levels of iron in the blood -magnetic resonance imaging (MRI) test scheduled -an unusual or allergic reaction to iron, other medicines, foods, dyes, or preservatives -pregnant or trying to get pregnant -breast-feeding How  should I use this medicine? This medicine is for injection into a vein. It is given by a health care professional in a hospital or clinic setting. Talk to your pediatrician regarding the use of this medicine in children. Special care may be needed. Overdosage: If you think you've taken too much of this medicine contact a poison control [name] or emergency room at once. Overdosage: If you think you have taken too much of this medicine contact a poison control [name] or emergency room at once. NOTE: This medicine is only for you. Do not share this medicine with others. What if I miss a dose? It is important not to miss your dose. Call your doctor or health care professional if you are unable to keep an appointment. What may interact with this medicine? This medicine may interact with the following medications: -other iron products This list may not describe all possible interactions. Give your health care provider a list of all the medicines, herbs, non-prescription drugs, or dietary supplements you use. Also tell them if you smoke, drink alcohol, or use illegal drugs. Some items may interact with your medicine. What should I watch for while using this medicine? Visit your doctor or healthcare professional regularly. Tell your doctor or healthcare professional if your symptoms do not start to get better or if they get worse. You may need blood work done while you are taking this medicine. You may need to follow a special diet. Talk to your doctor. Foods that contain iron include: whole grains/cereals, dried fruits, beans, or peas, leafy green vegetables, and organ meats (liver, kidney). What side effects may I notice from receiving this medicine? Side effects that you should report to your doctor or health care professional as soon as possible: -allergic reactions like skin rash, itching or hives, swelling of the face, lips, or tongue -breathing problems -changes in blood pressure -feeling faint or  lightheaded, falls -fever or chills -flushing, sweating, or hot feelings -swelling of the ankles or feet Side effects that usually do not require medical attention (Report these to your doctor or health care professional if they continue or are bothersome.): -diarrhea -headache -nausea, vomiting -stomach pain This list may not describe all possible side effects. Call your doctor for medical advice about side effects. You may report side effects to FDA at 1-800-FDA-1088. Where should I keep my medicine? This drug is given in a hospital or clinic and will not be stored at home. NOTE: This sheet is a summary. It may not cover all possible information. If you have questions about this medicine, talk to your doctor, pharmacist, or health care provider.  2015, [employer]. (2011-09-09 15:23:36)

## 2014-01-09 NOTE — Progress Notes (Signed)
[name] presented for labwork. Labs per MD order drawn via Peripheral Line 21 gauge needle inserted in right AC  Good blood return present. Procedure without incident.  Needle removed intact. Patient tolerated procedure well.

## 2014-01-09 NOTE — Progress Notes (Signed)
Teachey A. Barnet Glasgow, M.D.  NEW PATIENT EVALUATION   Name: Kathryn [name] Date: 01/10/2014 MRN: [id number] DOB: 1956/02/03  PCP: Redge Gainer, MD   REFERRING PHYSICIAN: Chipper Herb, MD  REASON FOR REFERRAL: Pancytopenia     HISTORY OF PRESENT ILLNESS:Kathryn [name] is a 58 y.o. female who is referred by her family physician because of low WBC and platelet count. She required blood transfusion on 12/19/2013 because of hemoglobin of 7.6. She has intermittent melena and has been diagnosed with esophageal varices as well as varices in association with ileostomy. She recently had a sclerotic procedure performed at [facility]. She denies any fever, night sweats, and has had chronic abdominal as well as lower extremity swelling bilaterally. She denies any cough, wheezing, sore throat, headache, but is short of breath on mild exertion. 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 Appetite has been fair with no nausea, vomiting, but with loose bowel movements secondary to chronic lactulose ingestion. 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   PAST MEDICAL HISTORY:  has a past medical history of Hypertension; Enteritis presumed infectious; Crohn's; Crohn's; Anemia; Hepatitis C antibody test positive; Hepatitis; Renal insufficiency; Cirrhosis; and Splenomegaly.     PAST SURGICAL HISTORY: Past Surgical History  Procedure Laterality Date   Colon surgery      MULTIPLE SURGERIES FOR CROHNS   Ileostomy      multiple abdominal surgeries for Crohn's by Dr. Anthony Sar   Cholecystectomy     Abscess drainage      abdominal   Esophagogastroduodenoscopy  10/05/2010    Procedure: ESOPHAGOGASTRODUODENOSCOPY (EGD);  Surgeon: Rogene Houston, MD;   [facility];  Service: Endoscopy;  Laterality: N/A;  7:30 am   Cirrhosis     Esophagogastroduodenoscopy N/A 09/11/2013    Procedure: ESOPHAGOGASTRODUODENOSCOPY (EGD);  Surgeon: Rogene Houston, MD;  [facility];  Service: Endoscopy;  Laterality: N/A;   Ileoscopy  09/11/2013    Procedure: ILEOSCOPY THROUGH STOMA;  Surgeon: Rogene Houston, MD;  [facility];  Service: Endoscopy;;   Givens capsule study N/A 09/12/2013    Procedure: GIVENS CAPSULE STUDY;  Surgeon: Rogene Houston, MD;  [facility];  Service: Endoscopy;  Laterality: N/A;   Givens capsule study N/A 11/25/2013    Procedure: GIVENS CAPSULE STUDY;  Surgeon: Rogene Houston, MD;  [facility];  Service: Endoscopy;  Laterality: N/A;   Varices cauterization Right     in the stoma of her ileostomy     CURRENT MEDICATIONS: has a current medication list which includes the following prescription(s): acetaminophen, alprazolam, cholecalciferol, gabapentin, lactulose, mesalamine, multivitamin with minerals, nadolol, rifaximin, tramadol, and pantoprazole.   ALLERGIES: Penicillins   SOCIAL HISTORY:  reports that she has never smoked. She has never used smokeless tobacco. She reports that she does not drink alcohol or use illicit drugs.   FAMILY HISTORY: family history includes Cancer in her mother; Stroke in her father.    REVIEW OF SYSTEMS:  Other than that discussed above is noncontributory.    PHYSICAL EXAM:  height is 5' 4.5" (1.638 m) and weight is 248 lb 1.6 oz (112.537 kg). Her oral temperature is 98.6 F (37 C).  Her blood pressure is 117/46 and her pulse is 76. Her respiration is 18 and oxygen saturation is 100%.    GENERAL:alert, no distress and comfortable SKIN: skin color, texture, turgor are normal, no rashes or significant lesions EYES: normal, Conjunctiva are pink and non-injected, sclera clear OROPHARYNX:no exudate, no erythema and lips, buccal mucosa, and  tongue normal  NECK: supple, thyroid normal size, non-tender, without nodularity CHEST: Normal AP diameter with no breast masses. LYMPH:  no palpable lymphadenopathy in the cervical, axillary or inguinal LUNGS: clear to auscultation and percussion with normal breathing effort HEART: regular rate & rhythm and no murmurs ABDOMEN:abdomen soft, non-tender and normal bowel sounds. Ileostomy in place with no evidence of stomal purulence or bleeding. Free fluid wave and shifting dullness is appreciated. MUSCULOSKELETALl:no cyanosis of digits, bilateral lower extremity edema, +3.  NEURO: alert & oriented x 3 with fluent speech, no focal motor/sensory deficits. No evidence of asterixis.    LABORATORY DATA:  Office Visit on 01/09/2014  Component Date Value Ref Range Status   WBC 01/09/2014 2.8* 4.0 - 10.5 K/uL Final   RBC 01/09/2014 3.38* 3.87 - 5.11 MIL/uL Final   Hemoglobin 01/09/2014 9.5* 12.0 - 15.0 g/dL Final   HCT 01/09/2014 29.2* 36.0 - 46.0 % Final   MCV 01/09/2014 86.4  78.0 - 100.0 fL Final   MCH 01/09/2014 28.1  26.0 - 34.0 pg Final   MCHC 01/09/2014 32.5  30.0 - 36.0 g/dL Final   RDW 01/09/2014 20.0* 11.5 - 15.5 % Final   Platelets 01/09/2014 93* 150 - 400 K/uL Final   Comment: SPECIMEN CHECKED FOR CLOTS PLATELET COUNT CONFIRMED BY SMEAR PLATELETS APPEAR DECREASED    Neutrophils Relative % 01/09/2014 51  43 - 77 % Final   Neutro Abs 01/09/2014 1.4* 1.7 - 7.7 K/uL Final   Lymphocytes Relative 01/09/2014 33  12 - 46 % Final   Lymphs Abs 01/09/2014 0.9  0.7 - 4.0 K/uL Final   Monocytes Relative 01/09/2014 10  3 - 12 % Final   Monocytes Absolute 01/09/2014 0.3  0.1 - 1.0 K/uL Final   Eosinophils Relative 01/09/2014 6* 0 - 5 % Final   Eosinophils Absolute 01/09/2014 0.2  0.0 - 0.7 K/uL Final   Basophils Relative 01/09/2014 1  0 - 1 % Final   Basophils Absolute 01/09/2014 0.0  0.0 - 0.1 K/uL Final   Retic Ct Pct 01/09/2014 3.2* 0.4 - 3.1 % Final   RBC.  01/09/2014 3.38* 3.87 - 5.11 MIL/uL Final   Retic Count, Manual 01/09/2014 108.2  19.0 - 186.0 K/uL Final   Sodium 01/09/2014 134* 137 - 147 mEq/L Final   Potassium 01/09/2014 3.3* 3.7 - 5.3 mEq/L Final   Chloride 01/09/2014 101  96 - 112 mEq/L Final   CO2 01/09/2014 22  19 - 32 mEq/L Final   Glucose, Bld 01/09/2014 193* 70 - 99 mg/dL Final   BUN 01/09/2014 13  6 - 23 mg/dL Final   Creatinine, Ser 01/09/2014 1.21* 0.50 - 1.10 mg/dL Final   Calcium 01/09/2014 8.8  8.4 - 10.5 mg/dL Final   Total Protein 01/09/2014 6.0  6.0 - 8.3 g/dL Final   Albumin 01/09/2014 2.6* 3.5 - 5.2 g/dL Final   AST 01/09/2014 33  0 - 37 U/L Final   ALT 01/09/2014 16  0 - 35 U/L Final   Alkaline Phosphatase 01/09/2014 181* 39 - 117 U/L Final   Total Bilirubin 01/09/2014 1.5* 0.3 - 1.2 mg/dL Final   GFR calc non Af Amer 01/09/2014  49* >90 mL/min Final   GFR calc Af Amer 01/09/2014 56* >90 mL/min Final   Comment: (NOTE) The eGFR has been calculated using the CKD EPI equation. This calculation has not been validated in all clinical situations. eGFR's persistently <90 mL/min signify possible Chronic Kidney Disease.    Anion gap 01/09/2014 11  5 - 15 Final   LDH 01/09/2014 272* 94 - 250 U/L Final   Vitamin B-12 01/09/2014 711  211 - 911 pg/mL Corrected   Performed at [facility]   Folate 01/09/2014 >20.0   Corrected   Comment: (NOTE) Reference Ranges        Deficient:       0.4 - 3.3 ng/mL        Indeterminate:   3.4 - 5.4 ng/mL        Normal:              > 5.4 ng/mL Performed at [place] 12/03 AT 2312: PREVIOUSLY REPORTED AS >20.0    Iron 01/09/2014 150* 42 - 135 ug/dL Final   TIBC 01/09/2014 435  250 - 470 ug/dL Final   Saturation Ratios 01/09/2014 34  20 - 55 % Final   UIBC 01/09/2014 285  125 - 400 ug/dL Final   Performed at [facility]   Ferritin 01/09/2014 24  10 - 291 ng/mL Corrected   Performed at [facility]  Visit on 12/23/2013  Component Date Value Ref Range Status   WBC 12/23/2013 4.7  3.4 - 10.8 x10E3/uL Final   RBC 12/23/2013 3.75* 3.77 - 5.28 x10E6/uL Final   Hemoglobin 12/23/2013 9.9* 11.1 - 15.9 g/dL Final   HCT 12/23/2013 30.5* 34.0 - 46.6 % Final   MCV 12/23/2013 81  79 - 97 fL Final   MCH 12/23/2013 26.4* 26.6 - 33.0 pg Final   MCHC 12/23/2013 32.5  31.5 - 35.7 g/dL Final   RDW 12/23/2013 16.3* 12.3 - 15.4 % Final   Platelets 12/23/2013 91* 150 - 379 x10E3/uL Final   Comment: Actual platelet count may be somewhat higher than reported due to aggregation of platelets in this sample.    Neutrophils Relative % 12/23/2013 72   Final   Lymphs 12/23/2013 20   Final   Monocytes 12/23/2013 7   Final   Eos 12/23/2013 1   Final   Basos 12/23/2013 0   Final   Neutrophils Absolute 12/23/2013 3.3  1.4 - 7.0 x10E3/uL Final   Lymphocytes Absolute 12/23/2013 0.9  0.7 - 3.1 x10E3/uL Final   Monocytes Absolute 12/23/2013 0.3  0.1 - 0.9 x10E3/uL Final   Eosinophils Absolute 12/23/2013 0.1  0.0 - 0.4 x10E3/uL Final   Basophils Absolute 12/23/2013 0.0  0.0 - 0.2 x10E3/uL Final   Immature Granulocytes 12/23/2013 0   Final   Immature Grans (Abs) 12/23/2013 0.0  0.0 - 0.1 x10E3/uL Final   Hematology Comments: 12/23/2013 Note:   Final   Verified by microscopic examination.  Hospital Outpatient Visit on 12/20/2013  Component Date Value Ref Range Status   Hemoglobin 12/20/2013 9.2* 12.0 - 15.0 g/dL Final   DELTA CHECK NOTED   HCT 12/20/2013 27.4* 36.0 - 46.0 % Final  Hospital Outpatient Visit on 12/19/2013  Component Date Value Ref Range Status   Hemoglobin 12/19/2013 7.6* 12.0 - 15.0 g/dL Final   HCT 12/19/2013 22.5* 36.0 - 46.0 % Final   Order Confirmation 12/19/2013 ORDER PROCESSED BY BLOOD BANK   Final   ABO/RH(D) 12/19/2013 O NEG  Final   Antibody Screen 12/19/2013 POS   Final   Sample Expiration 12/19/2013 12/22/2013   Final   Antibody Identification  56/43/3295 ANTI E   Final   DAT, IgG 12/19/2013 NEG   Final   Unit Number 12/19/2013 [id number]   Final   Blood Component Type 12/19/2013 RED CELLS,LR   Final   Unit division 12/19/2013 00   Final   Status of Unit 12/19/2013 ISSUED,FINAL   Final   Donor AG Type 12/19/2013 NEGATIVE FOR E ANTIGEN   Final   Transfusion Status 12/19/2013 OK TO TRANSFUSE   Final   Crossmatch Result 12/19/2013 COMPATIBLE   Final   Unit Number 12/19/2013 [id number]   Final   Blood Component Type 12/19/2013 RED CELLS,LR   Final   Unit division 12/19/2013 00   Final   Status of Unit 12/19/2013 ISSUED,FINAL   Final   Donor AG Type 12/19/2013 NEGATIVE FOR E ANTIGEN   Final   Transfusion Status 12/19/2013 OK TO TRANSFUSE   Final   Crossmatch Result 12/19/2013 COMPATIBLE   Final  Office Visit on 12/17/2013  Component Date Value Ref Range Status   Sodium 12/17/2013 124* 135 - 145 mEq/L Final   Potassium 12/17/2013 3.2* 3.5 - 5.3 mEq/L Final   Chloride 12/17/2013 94* 96 - 112 mEq/L Final   CO2 12/17/2013 19  19 - 32 mEq/L Final   Glucose, Bld 12/17/2013 218* 70 - 99 mg/dL Final   BUN 12/17/2013 17  6 - 23 mg/dL Final   Creat 12/17/2013 1.13* 0.50 - 1.10 mg/dL Final   Total Bilirubin 12/17/2013 1.3* 0.2 - 1.2 mg/dL Final   Alkaline Phosphatase 12/17/2013 185* 39 - 117 U/L Final   AST 12/17/2013 33  0 - 37 U/L Final   ALT 12/17/2013 36* 0 - 35 U/L Final   Total Protein 12/17/2013 5.3* 6.0 - 8.3 g/dL Final   Albumin 12/17/2013 2.8* 3.5 - 5.2 g/dL Final   Calcium 12/17/2013 8.3* 8.4 - 10.5 mg/dL Final   AFP-Tumor Marker 12/17/2013 5.0  <6.1 ng/mL Final   Comment: Patients < 47 month old: * Pediatric range is based on full term neonates, values for premature infants may be higher.   Female: The use of AFP as a Tumor Marker in pregnant patients is not recommended.   This test was performed using the Beckman Coulter chemiluminescent method. Values obtained from different assay  methods cannot be used interchangeably. AFP levels, regardless of value, should not be interpreted as absolute evidence of the presence or absence of disease.    HCV Quantitative 12/17/2013 Not Detected  <15 IU/mL Final   Comment: No detectable level of HCV RNA.      HCV Quantitative Log 12/17/2013 NOT CALC  <1.18 log 10 Final   Comment:   This test utilizes the [name] FDA approved Roche HCV Test Kit by RT-PCR.   Telephone on 12/13/2013  Component Date Value Ref Range Status   WBC 12/17/2013 5.2  4.0 - 10.5 K/uL Final   RBC 12/17/2013 2.68* 3.87 - 5.11 MIL/uL Final   Hemoglobin 12/17/2013 7.2* 12.0 - 15.0 g/dL Final   HCT 12/17/2013 21.7* 36.0 - 46.0 % Final   MCV 12/17/2013 81.0  78.0 - 100.0 fL Final   MCH 12/17/2013 26.9  26.0 - 34.0 pg Final   MCHC 12/17/2013 33.2  30.0 - 36.0 g/dL Final   RDW 12/17/2013 16.8* 11.5 - 15.5 % Final   Platelets 12/17/2013 111* 150 - 400 K/uL Final  Admission on 12/11/2013,  Discharged on 12/11/2013  Component Date Value Ref Range Status   Color, Urine 12/11/2013 YELLOW  YELLOW Final   APPearance 12/11/2013 CLEAR  CLEAR Final   Specific Gravity, Urine 12/11/2013 1.010  1.005 - 1.030 Final   pH 12/11/2013 6.0  5.0 - 8.0 Final   Glucose, UA 12/11/2013 NEGATIVE  NEGATIVE mg/dL Final   Hgb urine dipstick 12/11/2013 TRACE* NEGATIVE Final   Bilirubin Urine 12/11/2013 NEGATIVE  NEGATIVE Final   Ketones, ur 12/11/2013 NEGATIVE  NEGATIVE mg/dL Final   Protein, ur 12/11/2013 NEGATIVE  NEGATIVE mg/dL Final   Urobilinogen, UA 12/11/2013 0.2  0.0 - 1.0 mg/dL Final   Nitrite 12/11/2013 NEGATIVE  NEGATIVE Final   Leukocytes, UA 12/11/2013 MODERATE* NEGATIVE Final   WBC 12/11/2013 5.5  4.0 - 10.5 K/uL Final   RBC 12/11/2013 2.56* 3.87 - 5.11 MIL/uL Final   Hemoglobin 12/11/2013 7.0* 12.0 - 15.0 g/dL Final   HCT 12/11/2013 21.0* 36.0 - 46.0 % Final   MCV 12/11/2013 82.0  78.0 - 100.0 fL Final   MCH 12/11/2013 27.3  26.0 - 34.0 pg  Final   MCHC 12/11/2013 33.3  30.0 - 36.0 g/dL Final   RDW 12/11/2013 16.9* 11.5 - 15.5 % Final   Platelets 12/11/2013 99* 150 - 400 K/uL Final   Comment: SPECIMEN CHECKED FOR CLOTS PLATELET COUNT CONFIRMED BY SMEAR    Neutrophils Relative % 12/11/2013 69  43 - 77 % Final   Neutro Abs 12/11/2013 3.8  1.7 - 7.7 K/uL Final   Lymphocytes Relative 12/11/2013 14  12 - 46 % Final   Lymphs Abs 12/11/2013 0.8  0.7 - 4.0 K/uL Final   Monocytes Relative 12/11/2013 15* 3 - 12 % Final   Monocytes Absolute 12/11/2013 0.8  0.1 - 1.0 K/uL Final   Eosinophils Relative 12/11/2013 3  0 - 5 % Final   Eosinophils Absolute 12/11/2013 0.2  0.0 - 0.7 K/uL Final   Basophils Relative 12/11/2013 0  0 - 1 % Final   Basophils Absolute 12/11/2013 0.0  0.0 - 0.1 K/uL Final   Squamous Epithelial / LPF 12/11/2013 MANY* RARE Final   WBC, UA 12/11/2013 TOO NUMEROUS TO COUNT  <3 WBC/hpf Final   RBC / HPF 12/11/2013 3-6  <3 RBC/hpf Final   Bacteria, UA 12/11/2013 MANY* RARE Final   Urine-Other 12/11/2013 YEAST   Final   Specimen Description 12/11/2013 URINE, CLEAN CATCH   Final   Special Requests 12/11/2013 NONE   Final   Culture  Setup Time 12/11/2013    Final                   Value:12/11/2013 21:29 Performed at [place] 12/11/2013    Final                   Value:>=100,000 COLONIES/ML Performed at [facility]    Culture 12/11/2013    Final                   Value:Multiple bacterial morphotypes present, none predominant. Suggest appropriate recollection if clinically indicated. Performed at [facility]    Report Status 12/11/2013 12/12/2013 FINAL   Final   Sodium 12/11/2013 125* 137 - 147 mEq/L Final   Potassium 12/11/2013 3.2* 3.7 - 5.3 mEq/L Final   Chloride 12/11/2013 95* 96 - 112 mEq/L Final   CO2 12/11/2013 19  19 - 32 mEq/L Final   Glucose, Bld 12/11/2013 129* 70 - 99 mg/dL  Final   BUN 12/11/2013 15  6 - 23 mg/dL Final    Creatinine, Ser 12/11/2013 1.39* 0.50 - 1.10 mg/dL Final   Calcium 12/11/2013 8.3* 8.4 - 10.5 mg/dL Final   GFR calc non Af Amer 12/11/2013 41* >90 mL/min Final   GFR calc Af Amer 12/11/2013 48* >90 mL/min Final   Comment: (NOTE) The eGFR has been calculated using the CKD EPI equation. This calculation has not been validated in all clinical situations. eGFR's persistently <90 mL/min signify possible Chronic Kidney Disease.    Anion gap 12/11/2013 11  5 - 15 Final    Urinalysis    Component Value Date/Time   COLORURINE YELLOW 12/11/2013 1121   APPEARANCEUR CLEAR 12/11/2013 1121   LABSPEC 1.010 12/11/2013 1121   PHURINE 6.0 12/11/2013 1121   GLUCOSEU NEGATIVE 12/11/2013 1121   HGBUR TRACE* 12/11/2013 1121   BILIRUBINUR NEGATIVE 12/11/2013 1121   BILIRUBINUR neg 11/21/2013 1152   KETONESUR NEGATIVE 12/11/2013 1121   PROTEINUR NEGATIVE 12/11/2013 1121   PROTEINUR neg 11/21/2013 1152   UROBILINOGEN 0.2 12/11/2013 1121   UROBILINOGEN negative 11/21/2013 1152   NITRITE NEGATIVE 12/11/2013 1121   NITRITE neg 11/21/2013 1152   LEUKOCYTESUR MODERATE* 12/11/2013 1121      @RADIOGRAPHY : No results found.   Contrast   Status: Final result       PACS Images     Show images for MR Abdomen W Wo Contrast     Study Result     CLINICAL DATA: Cirrhosis.  EXAM: MRI ABDOMEN WITHOUT AND WITH CONTRAST  TECHNIQUE: Multiplanar multisequence MR imaging of the abdomen was performed both before and after the administration of intravenous contrast.  CONTRAST: 10.0 cc Gadavist  COMPARISON: 06/09/2013  FINDINGS: No pleural effusion identified. No pericardial effusion. Relative hypertrophy of the caudate lobe and lateral segment of left hepatic lobe noted. The liver has a nodular contour. The portal vein appears increased in caliber. On the arterial phase images there are no abnormal areas of hyper enhancement to suggest hepatoma. Previous cholecystectomy. No  biliary dilatation. The pancreas appears normal. The spleen is enlarged measuring 20 cm in length.  Normal appearance of the adrenal glands. The kidneys are on unremarkable.  Normal caliber of the abdominal aorta. No retroperitoneal adenopathy identified. A trace amount of ascites is noted within the upper abdomen. There is a ventral abdominal wall hernia which contains a nonobstructed loop of small bowel.  IMPRESSION: 1. Morphologic features of the liver compatible with cirrhosis. 2. Stigmata of portal venous hypertension with marked splenomegaly.   Electronically Signed  By: Kerby Moors M.D.  On: 09/13/2013 08:19        Status: Final result       PACS Images     Show images for CT Abdomen Pelvis W Contrast     Study Result     CLINICAL DATA: Ileostomy due to Crohn's disease, left-sided abdominal pain  EXAM: CT ABDOMEN AND PELVIS WITH CONTRAST  TECHNIQUE: Multidetector CT imaging of the abdomen and pelvis was performed using the standard protocol following bolus administration of intravenous contrast.  CONTRAST: 48m OMNIPAQUE IOHEXOL 300 MG/ML SOLN, 1065mOMNIPAQUE IOHEXOL 300 MG/ML SOLN  COMPARISON: None.  FINDINGS: The lung bases are clear.  The liver demonstrates a micronodular contour or with a small amount of perihepatic ascites most concerning for cirrhosis. There is no intrahepatic or extrahepatic biliary ductal dilatation. The gallbladder is surgically absent. There is splenomegaly without focal abnormality. The kidneys, adrenal glands and pancreas are normal. The bladder  is unremarkable.  There is a right lower quadrant ileostomy with a small parastomal hernia containing small bowel. There is a right paraumbilical hernia containing loops of mildly distended small bowel. There is no pneumoperitoneum, pneumatosis, or portal venous gas. There is no abdominal or pelvic free fluid. The uterus and ovaries  are unremarkable.  There is a mildly enlarged retroperitoneal lymph node measuring 11.7 mm in short axis posterior to the inferior vena cava. There is mild stable enlargement of a right cardiophrenic lymph node 14 in short axis.  The abdominal aorta is normal in caliber.  There are no lytic or sclerotic osseous lesions.  IMPRESSION: 1. Micronodular contour or of the liver with a small amount of perihepatic fluid concerning for cirrhosis. There is splenomegaly. 2. There is a small right paraumbilical hernia containing of small bowel with mild dilatation without obstruction. 3. Right lower quadrant ileostomy with a small peristomal hernia. 4. Stable retroperitoneal and cardiophrenic lymphadenopathy.   Electronically Signed  By: Kathreen Devoid  On: 06/09/2013         Status: Final result       PACS Images     Show images for NM GI Blood Loss     Study Result     CLINICAL DATA: Crohn's disease, post colectomy and ileostomy, who abdominal patent and low grade fever for 2 days, blood in ileostomy bag this morning  EXAM: NUCLEAR MEDICINE GASTROINTESTINAL BLEEDING SCAN  TECHNIQUE: Sequential abdominal images were obtained following intravenous administration of Tc-47mlabeled red blood cells. Images were obtained for 2 hr. A delayed static images obtained with the lead marker over the ileostomy.  RADIOPHARMACEUTICALS: 24.5 mCi Tc-932mn-vitro labeled autologous red cells.  COMPARISON: CT abdomen and pelvis 06/09/2013, MR abdomen 09/12/2013  FINDINGS: Significant enlargement of spleen.  Beginning on initial image, abnormal tracer localization is identified in the RIGHT lower quadrant at the approximate position of the ileostomy.  This is confirmed with a lead marker which shows that the area in question corresponds to the ileostomy.  This focus of abnormal increased red cell localization persists throughout the 2 hr of imaging as well as  the following delayed static image.  This accumulation does not peristalse within bowel loops or demonstrate motion.  No accumulation of this tracer into the ostomy bag is identified.  This likely represents red cell localization within a hyperemic segment of small bowel at the ileostomy, question active Crohn's disease.  This is not felt to represent a site of active GI bleeding due to its fixed position throughout the exam.  No other abnormal sites of tracer accumulation identified.  IMPRESSION: Abnormal accumulation of tracer in the RIGHT lower quadrant at the ileostomy, demonstrating fixed position/no motion of this collection of tagged red cells throughout the period of imaging.  This likely represents a hyperemic distal ileal bowel segment suspicious for active Crohn's disease.  The lack of motion of this tagged red cell collection is atypical for active GI bleeding.  Splenomegaly, prior cross-sectional imaging studies demonstrating cirrhotic features and splenomegaly as well.   Electronically Signed  By: MaLavonia Dana.D.  On: 11/25/2013     PATHOLOGY:   FINAL for WIARPITA, FENTRESSS([id number]: WITULSI, CROSSETTollected: 11/30/2012 Client:[facility] Accession: [id number]: 11/30/2012 AdMarkus DaftOB: [birth year]ge: [id number]: F Reported: 12/03/2012 [address] Ph: [phone] #: [id number] [id number] #: [id number] Chart #: Phone:  Fax: CC: TeButch PennyNP REPORT OF SURGICAL PATHOLOGY  INAL DIANOSIS Diagnosis Liver, needle/core biopsy, Right hepatic lobe - CHRONIC HEPATITIS, VIRAL-TYPE C, MILDLY ACTIVE, SEE COMMENT. - PORTAL TRACT EPITHELIOID GRANULOMATA IDENTIFIED. - TRICHROME STAIN DEMONSTRATES BRIDGING FIBROSIS WITH HEPATIC NODULE FORMATION. - LESS THAN 5% HEPATIC STEATOSIS PRESENT. - IRON STAIN DEMONSTRATES NO HEMOSIDERIN DEPOSITION. Microscopic Comment Comment:  There are epithelioid granulomata present in some of the portal areas associated with chronic inflammation. Given the history of Crohn's disease, the finding most likely represents liver involvement by inflammatory bowel disease. However, other granulomatous diseases need excluded (e.g. sarcoid). Modified Hepatitis Activity Index (MHAI) Necroinflammation Total MHAI: 5 of 18 Periportal or Periseptal Interface Hepatitis (piecemeal necrosis) Score: 2 Mild/moderate (focal, most portal areas) Confluent Necrosis Score: 0 Absent 0 Focal (spotty) Lytic necrosis, Apoptosis, and Focal inflammation Score: 1 One focus or less per 10x objective 1 Portal Inflammation Score: 2 Moderate, some or all portal areas Modified Staging Stage Score: 5 of 6 Marked bridging with occasional nodules (incomplete cirrhosis) 1 of 2 Duplicate copy FINAL for KAMAILE, ZACHOW ([id number]) Microscopic Comment(continued) References: 1. Isaak K et al. Histological grading and staging of chronic hepatitis J. Hepatol 1995;22:696-699. 2. Knodell RG, et al., Formulation and application of a numerical scoring system for assessing histologil activity in asymptomatic chronic active hepatitis. Hepatology 1981;1(5); 431-5. [place] RUND DO Pathologist, Electronic Signature (Case signed 12/03/2012) Specimen Gross and Clinical Information Specimen(s) Obtained: Liver, needle/core biopsy, Right hepatic lobe Specimen Clinical Information Cirrhosis (tl) Gross Received in formalin are two cores of tan soft tissue, each averaging 2.8 x 0.1 cm, and are bisected and submitted in one block. (SW:caf 11/30/12) Stain(s) used in Diagnosis: The following stain(s) were used in diagnosing the case: PAS/H w/Dig, Masson's Trichrome Stain, Reticulin Stain, Iron Stain. The control(s) stained appropriately. Report signed out from the following location(s) Associate Professor and Interpretation performed at [place], [place]  IMPRESSION:  #1. 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 #2. Pancytopenia secondary to #1. #3. 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 #4. Crohn's disease, status post surgery with ileostomy, normal B12 and Folate levels. #5. Portal hypertension, on treatment. #6. No gross evidence of hepatic encephalopathy at this time.   PLAN:  #1. IV Feraheme on 01/13/2014 and 01/20/2014. #2. If B-12 and folate levels are low, parenteral administration will be arranged. #3. Follow-up in 6 weeks with CBC and ferritin.  I appreciate the opportunity of sharing in her care.   Doroteo Bradford, MD 01/10/2014 6:39 AM   DISCLAIMER:  This note was dictated with voice recognition softwre.  Similar sounding words can inadvertently be transcribed inaccurately and may not be corrected upon review.

## 2014-01-13 ENCOUNTER — Encounter (HOSPITAL_COMMUNITY): Payer: Self-pay

## 2014-01-13 ENCOUNTER — Encounter (HOSPITAL_BASED_OUTPATIENT_CLINIC_OR_DEPARTMENT_OTHER): Payer: Medicare HMO

## 2014-01-13 DIAGNOSIS — E611 Iron deficiency: Secondary | ICD-10-CM

## 2014-01-13 DIAGNOSIS — D500 Iron deficiency anemia secondary to blood loss (chronic): Secondary | ICD-10-CM

## 2014-01-13 MED ORDER — SODIUM CHLORIDE 0.9 % IV SOLN
Freq: Once | INTRAVENOUS | Status: AC
  Administered 2014-01-13: 14:00:00 via INTRAVENOUS

## 2014-01-13 MED ORDER — SODIUM CHLORIDE 0.9 % IV SOLN
510.0000 mg | Freq: Once | INTRAVENOUS | Status: AC
  Administered 2014-01-13: 510 mg via INTRAVENOUS
  Filled 2014-01-13: qty 17

## 2014-01-13 MED ORDER — SODIUM CHLORIDE 0.9 % IJ SOLN: 10.0000 mL | INTRAMUSCULAR | Status: DC | PRN

## 2014-01-13 NOTE — Progress Notes (Signed)
Patient tolerated infusion well.

## 2014-01-17 ENCOUNTER — Other Ambulatory Visit: Payer: Self-pay | Admitting: *Deleted

## 2014-01-17 DIAGNOSIS — D731 Hypersplenism: Secondary | ICD-10-CM

## 2014-01-17 DIAGNOSIS — K769 Liver disease, unspecified: Secondary | ICD-10-CM

## 2014-01-17 DIAGNOSIS — K7460 Unspecified cirrhosis of liver: Secondary | ICD-10-CM

## 2014-01-17 DIAGNOSIS — K909 Intestinal malabsorption, unspecified: Secondary | ICD-10-CM

## 2014-01-17 DIAGNOSIS — K50919 Crohn's disease, unspecified, with unspecified complications: Secondary | ICD-10-CM

## 2014-01-17 DIAGNOSIS — D61818 Other pancytopenia: Secondary | ICD-10-CM

## 2014-01-17 DIAGNOSIS — E611 Iron deficiency: Secondary | ICD-10-CM

## 2014-01-17 MED ORDER — TRAMADOL HCL 50 MG PO TABS: 50.0000 mg | ORAL_TABLET | Freq: Four times a day (QID) | ORAL | Status: DC | PRN

## 2014-01-17 NOTE — Telephone Encounter (Signed)
Last filled 11/15/13, last seen 12/23/13. Rx will print

## 2014-01-20 ENCOUNTER — Encounter (HOSPITAL_BASED_OUTPATIENT_CLINIC_OR_DEPARTMENT_OTHER): Payer: Medicare HMO

## 2014-01-20 DIAGNOSIS — D500 Iron deficiency anemia secondary to blood loss (chronic): Secondary | ICD-10-CM

## 2014-01-20 DIAGNOSIS — E611 Iron deficiency: Secondary | ICD-10-CM

## 2014-01-20 MED ORDER — SODIUM CHLORIDE 0.9 % IV SOLN
510.0000 mg | Freq: Once | INTRAVENOUS | Status: AC
  Administered 2014-01-20: 510 mg via INTRAVENOUS
  Filled 2014-01-20: qty 17

## 2014-01-20 MED ORDER — SODIUM CHLORIDE 0.9 % IV SOLN
Freq: Once | INTRAVENOUS | Status: AC
  Administered 2014-01-20: 14:00:00 via INTRAVENOUS

## 2014-01-20 MED ORDER — SODIUM CHLORIDE 0.9 % IJ SOLN
10.0000 mL | INTRAMUSCULAR | Status: DC | PRN
  Administered 2014-01-20: 10 mL
  Filled 2014-01-20: qty 10

## 2014-01-20 NOTE — Progress Notes (Signed)
Tolerated well

## 2014-01-20 NOTE — Patient Instructions (Signed)
[facility] Discharge Instructions  RECOMMENDATIONS MADE BY THE CONSULTANT AND ANY TEST RESULTS WILL BE SENT TO YOUR REFERRING PHYSICIAN.  MEDICATIONS PRESCRIBED:  Feraheme infusion #2 of 2 today.  INSTRUCTIONS/FOLLOW-UP: Return as scheduled.  Thank you for choosing [facility] to provide your oncology and hematology care.  To afford each patient quality time with our providers, please arrive at least 15 minutes before your scheduled appointment time.  With your help, our goal is to use those 15 minutes to complete the necessary work-up to ensure our physicians have the information they need to help with your evaluation and healthcare recommendations.    Effective January 1st, 2014, we ask that you re-schedule your appointment with our physicians should you arrive 10 or more minutes late for your appointment.  We strive to give you quality time with our providers, and arriving late affects you and other patients whose appointments are after yours.    Again, thank you for choosing [facility].  Our hope is that these requests will decrease the amount of time that you wait before being seen by our physicians.       _____________________________________________________________  Should you have questions after your visit to [facility], please contact our office at [phone] between the hours of 8:30 a.m. and 4:30 p.m.  Voicemails left after 4:30 p.m. will not be returned until the following business day.  For prescription refill requests, have your pharmacy contact our office with your prescription refill request.    _______________________________________________________________  We hope that we have given you very good care.  You may receive a patient satisfaction survey in the mail, please complete it and return it as soon as possible.  We value your  feedback!  _______________________________________________________________  Have you asked about our STAR program?  STAR stands for Survivorship Training and Rehabilitation, and this is a nationally recognized cancer care program that focuses on survivorship and rehabilitation.  Cancer and cancer treatments may cause problems, such as, pain, making you feel tired and keeping you from doing the things that you need or want to do. Cancer rehabilitation can help. Our goal is to reduce these troubling effects and help you have the best quality of life possible.  You may receive a survey from a nurse that asks questions about your current state of health.  Based on the survey results, all eligible patients will be referred to the [facility] program for an evaluation so we can better serve you!  A frequently asked questions sheet is available upon request.

## 2014-01-21 ENCOUNTER — Telehealth: Payer: Self-pay | Admitting: Family Medicine

## 2014-01-21 NOTE — Telephone Encounter (Signed)
rx ready, pt aware 

## 2014-01-28 ENCOUNTER — Ambulatory Visit (INDEPENDENT_AMBULATORY_CARE_PROVIDER_SITE_OTHER): Payer: Medicare HMO | Admitting: Internal Medicine

## 2014-01-28 ENCOUNTER — Encounter (INDEPENDENT_AMBULATORY_CARE_PROVIDER_SITE_OTHER): Payer: Self-pay | Admitting: Internal Medicine

## 2014-01-28 VITALS — BP 102/70 | HR 68 | Temp 98.5°F | Ht 65.5 in | Wt 247.2 lb

## 2014-01-28 DIAGNOSIS — B1920 Unspecified viral hepatitis C without hepatic coma: Secondary | ICD-10-CM

## 2014-01-28 DIAGNOSIS — K760 Fatty (change of) liver, not elsewhere classified: Secondary | ICD-10-CM

## 2014-01-28 DIAGNOSIS — K50918 Crohn's disease, unspecified, with other complication: Secondary | ICD-10-CM

## 2014-01-28 LAB — HEPATIC FUNCTION PANEL
ALBUMIN: 2.8 g/dL — AB (ref 3.5–5.2)
ALT: 17 U/L (ref 0–35)
AST: 32 U/L (ref 0–37)
Alkaline Phosphatase: 198 U/L — ABNORMAL HIGH (ref 39–117)
BILIRUBIN INDIRECT: 1 mg/dL (ref 0.2–1.2)
Bilirubin, Direct: 0.4 mg/dL — ABNORMAL HIGH (ref 0.0–0.3)
TOTAL PROTEIN: 5.7 g/dL — AB (ref 6.0–8.3)
Total Bilirubin: 1.4 mg/dL — ABNORMAL HIGH (ref 0.2–1.2)

## 2014-01-28 NOTE — Patient Instructions (Addendum)
Hepatic Function, Hep C quaint. Sedrate OV in 4 months with Dr [name]

## 2014-01-28 NOTE — Progress Notes (Addendum)
Subjective:    Patient ID: Kathryn [name], female    DOB: 29-Aug-1955, 58 y.o.   MRN: [id number]  HPI Here today for f/u. Hx of Crohn's disease requiring multiple surgeries and now has an ileostomy. Diagnosed in 1985. 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  She completed antiviral therapy in August of this year.  She cleared to virus. She took Harvoni.  In October of this year she underwent a transjuglar intrahepatic portosystemic shunt (TIPS) [facility]. Admitted to [facility] in October for GI bleed. EGD revealed portal gastropathy, normal terminal ileoscopy and small bowel given capsule did not reveal source of bleeding.   She tells me she is doing good. She is back to work. She says she is seen at the [facility] for anemia and low platelet ct. Last iron transfusion 1st of this month. For the most part she feels good. Appetite is good. She has gained about 8 pounds since her last visit. She does c/o edema to her lower extremities. PCP is Dr. Purcell Nails at [facility]. She is emptying her ileostomy bag about 5-6 times a day.  No melena or BRRB.           ILEOSCOPY PROCEDURE REPORT EXAM DATE: 11/29/2013  PATIENT NAME: Kathryn [name], Kathryn [name] MR #: [id number] BIRTHDATE: 1955-06-21 VISIT #: [id number] ATTENDING: Angelyn Punt M.D. STATUS: Inpatient ASSISTANT: Beatrix Shipper R.N and Dot Clearance Coots GIT  INDICATIONS: The patient is a 58 yr old Female here for an ileoscopy procedure due to a history of bleeding from ileostomy s/p normal endoscopies at local hospital PROCEDURE PERFORMED: Ileoscopy via Stoma  MEDICATIONS: 1. 50 mg Fentanyl IV  ESTIMATED BLOOD LOSS: None  CONSENT: The patient understands the risks and benefits of the procedure and understands that these risks include, but are not limited to: sedation, allergic reaction, infection, perforation and/or bleeding. Alternative means of evaluation and treatment include, among others: physical exam, x-rays,  and/or surgical intervention. The patient elects to proceed with this endoscopic procedure.  DESCRIPTION OF PROCEDURE: During intra-op preparation period all mechanical & medical equipment was checked for proper function. Hand hygiene and appropriate measures for infection prevention was taken. After the risks, benefits and alternatives of the procedure were thoroughly explained, Informed consent was verified, confirmed and timeout was successfully executed by the treatment team. A digital rectal exam was not performed. The [id number] endoscope was introduced through the ileostomy and advanced to the 25 cm of the ileum . The prep was good. The instrument was then slowly withdrawn while examining the mucosa circumferentially. The scope was then completely withdrawn from the patient and the procedure terminated. The pulse, BP, and O2 saturation were monitored and documented by the physician and the nursing staff throughout the entire procedure. The patient was cared for as planned according to standard protocol. The patient was then discharged to recovery in stable condition and with appropriate post procedure care.  Prior to starting the Ileoscopy via stoma we noticed about 400cc of bright red blood mixed with clots in the ostomy bag (new finding since examination of ostomy in pre-op bay at which point stool was just brown). The ileum mucosa was carefully inspected to 25cm and was completely normal with no fresh or old blood in the ileum. We then carefully expect the external ostomy and could not find any actively bleeding source.  ADVERSE EVENTS: There were no complications. IMPRESSIONS: Recent acute bleeding from ostomy/stoma No fresh or old blood in ileum Call GI consult  team with any acute concerns RECALL: None    09/10/2013 US abdomen: IMPRESSION: No acute abnormality. Chronic splenomegaly. Chronic nodularity and coarse echogenicity of the liver parenchyma consistent with  the history of chronic liver disease.     CBC    Component Value Date/Time   WBC 2.8* 01/09/2014 1600   WBC 4.7 12/23/2013 1601   RBC 3.38* 01/09/2014 1600   RBC 3.38* 01/09/2014 1600   RBC 3.75* 12/23/2013 1601   HGB 9.5* 01/09/2014 1600   HCT 29.2* 01/09/2014 1600   PLT 93* 01/09/2014 1600   MCV 86.4 01/09/2014 1600   MCH 28.1 01/09/2014 1600   MCH 26.4* 12/23/2013 1601   MCHC 32.5 01/09/2014 1600   MCHC 32.5 12/23/2013 1601   RDW 20.0* 01/09/2014 1600   RDW 16.3* 12/23/2013 1601   LYMPHSABS 0.9 01/09/2014 1600   LYMPHSABS 0.9 12/23/2013 1601   MONOABS 0.3 01/09/2014 1600   EOSABS 0.2 01/09/2014 1600   EOSABS 0.1 12/23/2013 1601   BASOSABS 0.0 01/09/2014 1600   BASOSABS 0.0 12/23/2013 1601      11/26/2013 Given Capsule:  Summary & Recommendations: Few small bowel erosions without stigmata of bleed. These erosions may indicate mild small bowel Crohn's disease as there is no history of NSAID use. Will start patient on Pentasa 1 g by mouth 3 times a day. If bleeding recurs she will need [facility]  Review of Systems Past Medical History  Diagnosis Date  . Hypertension   . Enteritis presumed infectious   . Crohn's   . Crohn's   . Anemia   . Hepatitis C antibody test positive   . Hepatitis   . Renal insufficiency   . Cirrhosis   . Splenomegaly     Past Surgical History  Procedure Laterality Date  . Colon surgery      MULTIPLE SURGERIES FOR CROHNS  . Ileostomy      multiple abdominal surgeries for Crohn's by Dr. Gabriel CirrieMason  . Cholecystectomy    . Abscess drainage      abdominal  . Esophagogastroduodenoscopy  10/05/2010    Procedure: ESOPHAGOGASTRODUODENOSCOPY (EGD);  Surgeon: Malissa HippoNajeeb U Rehman, MD;  [facility];  Service: Endoscopy;  Laterality: N/A;  7:30 am  . Cirrhosis    . Esophagogastroduodenoscopy N/A 09/11/2013    Procedure: ESOPHAGOGASTRODUODENOSCOPY (EGD);  Surgeon: Malissa HippoNajeeb U Rehman, MD;  [facility]  [facility];  Service: Endoscopy;  Laterality: N/A;  . Ileoscopy  09/11/2013    Procedure: ILEOSCOPY THROUGH STOMA;  Surgeon: Malissa HippoNajeeb U Rehman, MD;  Location: [facility];  Service: Endoscopy;;  . Givens capsule study N/A 09/12/2013    Procedure: GIVENS CAPSULE STUDY;  Surgeon: Malissa HippoNajeeb U Rehman, MD;  [facility]: [facility];  Service: Endoscopy;  Laterality: N/A;  . Givens capsule study N/A 11/25/2013    Procedure: GIVENS CAPSULE STUDY;  Surgeon: Malissa HippoNajeeb U Rehman, MD;  Location: [facility];  Service: Endoscopy;  Laterality: N/A;  . Varices cauterization Right     in the stoma of her ileostomy  . Tips procedure      [facility]    Allergies  Allergen Reactions  . Penicillins Rash    Current Outpatient Prescriptions on File Prior to Visit  Medication Sig Dispense Refill  . acetaminophen (RA ACETAMINOPHEN) 650 MG CR tablet Take 1,300 mg by mouth 2 (two) times daily as needed.    . ALPRAZolam (XANAX) 1 MG tablet Take 1 tablet (1 mg total) by mouth  at bedtime as needed for sleep. 30 tablet 1  . Cholecalciferol (VITAMIN D-1000 MAX ST) 1000 UNITS tablet Take 1,000 Units by mouth daily.    Marland Kitchen gabapentin (NEURONTIN) 100 MG capsule Take 1 capsule (100 mg total) by mouth 3 (three) times daily. 270 capsule 0  . Multiple Vitamins-Minerals (MULTIVITAMIN WITH MINERALS) tablet Take 1 tablet by mouth daily.    . nadolol (CORGARD) 40 MG tablet Take 40 mg by mouth daily.    . pantoprazole (PROTONIX) 40 MG tablet Take 40 mg by mouth daily.    . rifaximin (XIFAXAN) 550 MG TABS tablet Take 550 mg by mouth 2 (two) times daily.    . traMADol (ULTRAM) 50 MG tablet Take 1 tablet (50 mg total) by mouth every 6 (six) hours as needed. 30 tablet 0   No current facility-administered medications on file prior to visit.        Objective:   Physical Exam   Filed Vitals:   01/28/14 1049  Height: 5' 5.5" (1.664 m)  Weight: 247 lb 3.2 oz (112.129 kg)  Alert and oriented. Skin warm and dry. Oral mucosa is  moist.   . Sclera anicteric, conjunctivae is pink. Thyroid not enlarged. No cervical lymphadenopathy. Lungs clear. Heart regular rate and rhythm.  Abdomen is soft. Bowel sounds are positive. No hepatomegaly. No abdominal masses felt. No tenderness. 2+ edema to lower extremities.          Assessment & Plan:  Hx of Crohn's disease. She seems to be in remission.  Hepatitis C: She has cleared the virus. Will get a Hepatitis C quaint today as well as Hepatic function. [name] is UTD. Iron deficiency anemia: is followed by [facility]. AFP, Sedrate, Hepatic function, and Hep C quaint today.  OV in 4 months with Dr. [name]

## 2014-01-29 LAB — HEPATITIS C RNA QUANTITATIVE: HCV Quantitative: NOT DETECTED IU/mL (ref <15)

## 2014-01-29 LAB — AFP TUMOR MARKER: AFP TUMOR MARKER: 5.5 ng/mL (ref <6.1)

## 2014-01-29 LAB — SEDIMENTATION RATE: Sed Rate: 11 mm/hr (ref 0–22)

## 2014-02-03 ENCOUNTER — Telehealth (INDEPENDENT_AMBULATORY_CARE_PROVIDER_SITE_OTHER): Payer: Self-pay | Admitting: *Deleted

## 2014-02-03 ENCOUNTER — Other Ambulatory Visit (INDEPENDENT_AMBULATORY_CARE_PROVIDER_SITE_OTHER): Payer: Self-pay | Admitting: Internal Medicine

## 2014-02-03 DIAGNOSIS — R600 Localized edema: Secondary | ICD-10-CM

## 2014-02-03 DIAGNOSIS — B182 Chronic viral hepatitis C: Secondary | ICD-10-CM

## 2014-02-03 NOTE — Telephone Encounter (Signed)
CBC with diff in 2 weeks and in 4 months. Message sent to [facility].  OV in 4 months

## 2014-02-03 NOTE — Telephone Encounter (Signed)
.  Per Delrae Renderri Setzer,NP patient to have lab work in 2 weeks. She will also need to have a OV in 4 months Lupita Leash(Donna)

## 2014-02-04 ENCOUNTER — Encounter (INDEPENDENT_AMBULATORY_CARE_PROVIDER_SITE_OTHER): Payer: Self-pay | Admitting: *Deleted

## 2014-02-04 ENCOUNTER — Other Ambulatory Visit (INDEPENDENT_AMBULATORY_CARE_PROVIDER_SITE_OTHER): Payer: Self-pay | Admitting: *Deleted

## 2014-02-04 DIAGNOSIS — B182 Chronic viral hepatitis C: Secondary | ICD-10-CM

## 2014-02-04 DIAGNOSIS — R600 Localized edema: Secondary | ICD-10-CM

## 2014-02-04 NOTE — Telephone Encounter (Signed)
Has an apt on 05/02/016 with Dorene Ar, NP.

## 2014-02-10 ENCOUNTER — Telehealth: Payer: Self-pay | Admitting: Family Medicine

## 2014-02-12 NOTE — Telephone Encounter (Signed)
Returning triage call- call her at [phone]

## 2014-02-12 NOTE — Telephone Encounter (Signed)
Appointment given for tomorrow at 8 with [place].

## 2014-02-13 ENCOUNTER — Ambulatory Visit (INDEPENDENT_AMBULATORY_CARE_PROVIDER_SITE_OTHER): Payer: Medicare HMO | Admitting: Family Medicine

## 2014-02-13 ENCOUNTER — Encounter: Payer: Self-pay | Admitting: Family Medicine

## 2014-02-13 VITALS — BP 125/73 | HR 70 | Temp 97.4°F | Ht 65.5 in | Wt 246.0 lb

## 2014-02-13 DIAGNOSIS — R739 Hyperglycemia, unspecified: Secondary | ICD-10-CM

## 2014-02-13 DIAGNOSIS — F411 Generalized anxiety disorder: Secondary | ICD-10-CM

## 2014-02-13 DIAGNOSIS — L97911 Non-pressure chronic ulcer of unspecified part of right lower leg limited to breakdown of skin: Secondary | ICD-10-CM

## 2014-02-13 LAB — POCT GLYCOSYLATED HEMOGLOBIN (HGB A1C): Hemoglobin A1C: 4.9

## 2014-02-13 MED ORDER — ALPRAZOLAM 1 MG PO TABS: 1.0000 mg | ORAL_TABLET | Freq: Every evening | ORAL | Status: DC | PRN

## 2014-02-13 MED ORDER — DOXYCYCLINE HYCLATE 100 MG PO TABS: 100.0000 mg | ORAL_TABLET | Freq: Two times a day (BID) | ORAL | Status: DC

## 2014-02-13 MED ORDER — SPIRONOLACTONE 25 MG PO TABS: 25.0000 mg | ORAL_TABLET | Freq: Every day | ORAL | Status: DC

## 2014-02-13 NOTE — Addendum Note (Signed)
Addended by: Deatra Canter on: 02/13/2014 09:24 AM   Modules accepted: Orders

## 2014-02-13 NOTE — Progress Notes (Signed)
   Subjective:    Patient ID: Kathryn [name], female    DOB: February 21, 1955, 59 y.o.   MRN: [id number]  HPI Patient is here for c/o leg swelling.  She has hx of NASH and hepatitis C and cirrhosis.  She sees GI.  She has been swelling a lot over last few months.  She drinks approx 32 ounces of water a day.  She needs refill on xanax.    Review of Systems  Constitutional: Negative for fever.  HENT: Negative for ear pain.   Eyes: Negative for discharge.  Respiratory: Negative for cough.   Cardiovascular: Negative for chest pain.  Gastrointestinal: Negative for abdominal distention.  Endocrine: Negative for polyuria.  Genitourinary: Negative for difficulty urinating.  Musculoskeletal: Negative for gait problem and neck pain.  Skin: Negative for color change and rash.  Neurological: Negative for speech difficulty and headaches.  Psychiatric/Behavioral: Negative for agitation.       Objective:    BP 125/73 mmHg  Pulse 70  Temp(Src) 97.4 F (36.3 C) (Oral)  Ht 5' 5.5" (1.664 m)  Wt 246 lb (111.585 kg)  BMI 40.30 kg/m2 Physical Exam  Constitutional: She is oriented to person, place, and time. She appears well-developed and well-nourished.  HENT:  Head: Normocephalic and atraumatic.  Mouth/Throat: Oropharynx is clear and moist.  Eyes: Pupils are equal, round, and reactive to light.  Neck: Normal range of motion. Neck supple.  Cardiovascular: Normal rate and regular rhythm.   No murmur heard. Pulmonary/Chest: Effort normal and breath sounds normal.  Abdominal: Soft. Bowel sounds are normal. There is no tenderness.  Neurological: She is alert and oriented to person, place, and time.  Skin: Skin is warm and dry.  Bilateral lower extremities with anasarca edema.  Psychiatric: She has a normal mood and affect.          Assessment & Plan:     ICD-9-CM ICD-10-CM   1. Leg ulcer, right, limited to breakdown of skin 707.10 L97.911 spironolactone (ALDACTONE) 25 MG tablet   doxycycline (VIBRA-TABS) 100 MG tablet  2. Hyperglycemia 790.29 R73.9 POCT glycosylated hemoglobin (Hb A1C)  3. Anxiety state 300.00 F41.1 ALPRAZolam (XANAX) 1 MG tablet     Return in about 2 weeks (around 02/27/2014).  Deatra Canter FNP

## 2014-02-18 LAB — CBC WITH DIFFERENTIAL/PLATELET
Basophils Absolute: 0 10*3/uL (ref 0.0–0.1)
Basophils Relative: 1 % (ref 0–1)
Eosinophils Absolute: 0.3 10*3/uL (ref 0.0–0.7)
Eosinophils Relative: 6 % — ABNORMAL HIGH (ref 0–5)
HCT: 32.5 % — ABNORMAL LOW (ref 36.0–46.0)
Hemoglobin: 10.5 g/dL — ABNORMAL LOW (ref 12.0–15.0)
Lymphocytes Relative: 32 % (ref 12–46)
Lymphs Abs: 1.6 10*3/uL (ref 0.7–4.0)
MCH: 29.3 pg (ref 26.0–34.0)
MCHC: 32.3 g/dL (ref 30.0–36.0)
MCV: 90.8 fL (ref 78.0–100.0)
MPV: 9.6 fL (ref 8.6–12.4)
Monocytes Absolute: 0.6 10*3/uL (ref 0.1–1.0)
Monocytes Relative: 13 % — ABNORMAL HIGH (ref 3–12)
Neutro Abs: 2.4 10*3/uL (ref 1.7–7.7)
Neutrophils Relative %: 48 % (ref 43–77)
Platelets: 85 10*3/uL — ABNORMAL LOW (ref 150–400)
RBC: 3.58 MIL/uL — ABNORMAL LOW (ref 3.87–5.11)
RDW: 17.6 % — ABNORMAL HIGH (ref 11.5–15.5)
WBC: 4.9 10*3/uL (ref 4.0–10.5)

## 2014-02-19 ENCOUNTER — Telehealth (INDEPENDENT_AMBULATORY_CARE_PROVIDER_SITE_OTHER): Payer: Self-pay | Admitting: *Deleted

## 2014-02-19 DIAGNOSIS — K7469 Other cirrhosis of liver: Secondary | ICD-10-CM

## 2014-02-19 DIAGNOSIS — D509 Iron deficiency anemia, unspecified: Secondary | ICD-10-CM

## 2014-02-19 DIAGNOSIS — R600 Localized edema: Secondary | ICD-10-CM

## 2014-02-19 DIAGNOSIS — K5090 Crohn's disease, unspecified, without complications: Secondary | ICD-10-CM

## 2014-02-19 NOTE — Telephone Encounter (Signed)
.  Per Delrae Rend patient to have labs in 2 months.

## 2014-02-20 ENCOUNTER — Encounter (HOSPITAL_COMMUNITY): Payer: Self-pay | Admitting: Internal Medicine

## 2014-02-20 ENCOUNTER — Ambulatory Visit (HOSPITAL_COMMUNITY): Payer: Medicare HMO | Admitting: Hematology & Oncology

## 2014-02-21 ENCOUNTER — Encounter (HOSPITAL_COMMUNITY): Payer: Medicare HMO | Attending: Hematology and Oncology | Admitting: Hematology & Oncology

## 2014-02-21 ENCOUNTER — Encounter (HOSPITAL_COMMUNITY): Payer: Self-pay | Admitting: Hematology & Oncology

## 2014-02-21 ENCOUNTER — Encounter (HOSPITAL_BASED_OUTPATIENT_CLINIC_OR_DEPARTMENT_OTHER): Payer: Medicare HMO

## 2014-02-21 VITALS — BP 106/48 | HR 65 | Temp 98.3°F | Resp 20 | Wt 244.2 lb

## 2014-02-21 DIAGNOSIS — D500 Iron deficiency anemia secondary to blood loss (chronic): Secondary | ICD-10-CM

## 2014-02-21 DIAGNOSIS — K922 Gastrointestinal hemorrhage, unspecified: Secondary | ICD-10-CM | POA: Diagnosis not present

## 2014-02-21 DIAGNOSIS — D696 Thrombocytopenia, unspecified: Secondary | ICD-10-CM

## 2014-02-21 DIAGNOSIS — K909 Intestinal malabsorption, unspecified: Secondary | ICD-10-CM

## 2014-02-21 DIAGNOSIS — K769 Liver disease, unspecified: Secondary | ICD-10-CM

## 2014-02-21 DIAGNOSIS — D731 Hypersplenism: Secondary | ICD-10-CM

## 2014-02-21 DIAGNOSIS — D61818 Other pancytopenia: Secondary | ICD-10-CM

## 2014-02-21 DIAGNOSIS — D509 Iron deficiency anemia, unspecified: Secondary | ICD-10-CM

## 2014-02-21 DIAGNOSIS — K50919 Crohn's disease, unspecified, with unspecified complications: Secondary | ICD-10-CM

## 2014-02-21 DIAGNOSIS — D62 Acute posthemorrhagic anemia: Secondary | ICD-10-CM | POA: Insufficient documentation

## 2014-02-21 DIAGNOSIS — K7460 Unspecified cirrhosis of liver: Secondary | ICD-10-CM

## 2014-02-21 DIAGNOSIS — E611 Iron deficiency: Secondary | ICD-10-CM

## 2014-02-21 DIAGNOSIS — Z139 Encounter for screening, unspecified: Secondary | ICD-10-CM

## 2014-02-21 LAB — IRON AND TIBC
Iron: 130 ug/dL (ref 42–145)
Saturation Ratios: 50 % (ref 20–55)
TIBC: 262 ug/dL (ref 250–470)
UIBC: 132 ug/dL (ref 125–400)

## 2014-02-21 LAB — SEDIMENTATION RATE: Sed Rate: 15 mm/hr (ref 0–22)

## 2014-02-21 NOTE — Progress Notes (Signed)
Kathryn Heap, MD [address] [place] [address]  Pancytopenia  Esophageal Varices Hepatitis C with cirrhosis/splenomegaly. Treated with Harvoni in 09/2013 Crohn's disease s/p surgery with ileostomy Iron deficiency with Faraheme  on 01/13/2014 and 01/20/2014  CURRENT THERAPY: IV Faraheme  INTERVAL HISTORY: Kathryn [name] 59 y.o. female returns for follow-up of her iron deficiency. She has been feeling fairly well. She has developed LE edema. She was just started on aldactone. She is noticing an improvement.   MEDICAL HISTORY: Past Medical History  Diagnosis Date  . Hypertension   . Enteritis presumed infectious   . Crohn's   . Crohn's   . Anemia   . Hepatitis C antibody test positive   . Hepatitis   . Renal insufficiency   . Cirrhosis   . Splenomegaly   . Bilateral leg edema 01/2014    has Hypertension; Crohn's disease; Acute blood loss anemia; Hepatic cirrhosis; Hepatitis C antibody test positive; Hepatitis C reactive; GERD (gastroesophageal reflux disease); Peripheral neuropathy; Insomnia; GI bleed; Thrombocytopenia; UTI (urinary tract infection); Coagulopathy; Exertional dyspnea; Weakness; GI bleeding; CKD (chronic kidney disease), stage III; Anemia of chronic disease; and Iron deficiency on her problem list.     No history exists.     is allergic to penicillins.  Kathryn [name] had no medications administered during this visit.  SURGICAL HISTORY: Past Surgical History  Procedure Laterality Date  . Colon surgery      MULTIPLE SURGERIES FOR CROHNS  . Ileostomy      multiple abdominal surgeries for Crohn's by Dr. Gabriel [name]  . Cholecystectomy    . Abscess drainage      abdominal  . Esophagogastroduodenoscopy  10/05/2010    Procedure: ESOPHAGOGASTRODUODENOSCOPY (EGD);  Surgeon: Kathryn Hippo, MD;  [facility];  Service: Endoscopy;  Laterality: N/A;  7:30 am  . Cirrhosis    . Esophagogastroduodenoscopy N/A 09/11/2013    Procedure:  ESOPHAGOGASTRODUODENOSCOPY (EGD);  Surgeon: Kathryn Hippo, MD;  [facility]: [facility];  Service: Endoscopy;  Laterality: N/A;  . Ileoscopy  09/11/2013    Procedure: ILEOSCOPY THROUGH STOMA;  Surgeon: Kathryn Hippo, MD;  Location: [facility];  Service: Endoscopy;;  . Givens capsule study N/A 09/12/2013    Procedure: GIVENS CAPSULE STUDY;  Surgeon: Kathryn Hippo, MD;  [facility];  Service: Endoscopy;  Laterality: N/A;  . Givens capsule study N/A 11/25/2013    Procedure: GIVENS CAPSULE STUDY;  Surgeon: Kathryn Hippo, MD;  Location[facility];  Service: Endoscopy;  Laterality: N/A;  . Varices cauterization Right     in the stoma of her ileostomy  . Tips procedure      [facility] in October    SOCIAL HISTORY: History   Social History  . Marital Status: Married    Spouse Name: N/A    Number of Children: N/A  . Years of Education: N/A   Occupational History  . Not on file.   Social History Main Topics  . Smoking status: Never Smoker   . Smokeless tobacco: Never Used  . Alcohol Use: No  . Drug Use: No  . Sexual Activity: Yes   Other Topics Concern  . Not on file   Social History Narrative    FAMILY HISTORY: Family History  Problem Relation Age of Onset  . Cancer Mother   . Stroke Father     Review of Systems  Constitutional: Negative for fever, chills, weight loss, malaise/fatigue and diaphoresis.  HENT: Negative.  Eyes: Negative.   Respiratory:       Gets SOB with exertion, chronic  Cardiovascular: Negative.   Gastrointestinal: Negative for heartburn, nausea and vomiting.       Has ostomy   Genitourinary: Negative.   Musculoskeletal: Negative.   Skin: Negative.   Neurological: Negative.  Negative for weakness.  Endo/Heme/Allergies: Negative.   Psychiatric/Behavioral: Negative.     PHYSICAL EXAMINATION  ECOG PERFORMANCE STATUS: 0 - Asymptomatic  Filed Vitals:   02/21/14 1413  BP: 106/48  Pulse: 65  Temp: 98.3 F (36.8 C)  Resp: 20      Physical Exam  Constitutional: She is oriented to person, place, and time and well-developed, well-nourished, and in no distress.  HENT:  Head: Normocephalic and atraumatic.  Nose: Nose normal.  Mouth/Throat: Oropharynx is clear and moist. No oropharyngeal exudate.  Eyes: Conjunctivae and EOM are normal. Pupils are equal, round, and reactive to light. Right eye exhibits no discharge. Left eye exhibits no discharge. No scleral icterus.  Neck: Normal range of motion. Neck supple. No tracheal deviation present. No thyromegaly present.  Cardiovascular: Normal rate, regular rhythm and normal heart sounds.  Exam reveals no gallop and no friction rub.   No murmur heard. Pulmonary/Chest: Effort normal and breath sounds normal. She has no wheezes. She has no rales.  Abdominal: Soft. Bowel sounds are normal. She exhibits no distension and no mass. There is no tenderness. There is no rebound and no guarding.  Musculoskeletal: Normal range of motion. She exhibits edema.  Lymphadenopathy:    She has no cervical adenopathy.  Neurological: She is alert and oriented to person, place, and time. She has normal reflexes. No cranial nerve deficit. Gait normal. Coordination normal.  Skin: Skin is warm and dry. No rash noted.  Psychiatric: Mood, memory, affect and judgment normal.  Nursing note and vitals reviewed.   LABORATORY DATA:  CBC    Component Value Date/Time   WBC 4.9 02/17/2014 1258   WBC 4.7 12/23/2013 1601   RBC 3.58* 02/17/2014 1258   RBC 3.38* 01/09/2014 1600   RBC 3.75* 12/23/2013 1601   HGB 10.5* 02/17/2014 1258   HCT 32.5* 02/17/2014 1258   PLT 85* 02/17/2014 1258   MCV 90.8 02/17/2014 1258   MCH 29.3 02/17/2014 1258   MCH 26.4* 12/23/2013 1601   MCHC 32.3 02/17/2014 1258   MCHC 32.5 12/23/2013 1601   RDW 17.6* 02/17/2014 1258   RDW 16.3* 12/23/2013 1601   LYMPHSABS 1.6 02/17/2014 1258   LYMPHSABS 0.9 12/23/2013 1601   MONOABS 0.6 02/17/2014 1258   EOSABS 0.3 02/17/2014  1258   EOSABS 0.1 12/23/2013 1601   BASOSABS 0.0 02/17/2014 1258   BASOSABS 0.0 12/23/2013 1601   CMP     Component Value Date/Time   NA 134* 01/09/2014 1600   NA 137 07/04/2013 0908   K 3.3* 01/09/2014 1600   CL 101 01/09/2014 1600   CO2 22 01/09/2014 1600   GLUCOSE 193* 01/09/2014 1600   GLUCOSE 103* 07/04/2013 0908   BUN 13 01/09/2014 1600   BUN 14 07/04/2013 0908   CREATININE 1.21* 01/09/2014 1600   CREATININE 1.13* 12/17/2013 1500   CALCIUM 8.8 01/09/2014 1600   PROT 5.7* 01/28/2014 1124   PROT 6.1 07/04/2013 0908   ALBUMIN 2.8* 01/28/2014 1124   AST 32 01/28/2014 1124   ALT 17 01/28/2014 1124   ALKPHOS 198* 01/28/2014 1124   BILITOT 1.4* 01/28/2014 1124   GFRNONAA 49* 01/09/2014 1600   GFRAA 56* 01/09/2014 1600  ASSESSMENT and THERAPY PLAN:    Iron deficiency Pleasant 59 year old female with a history of Crohn's disease, hepatitis C recently treated with Harvoni. She has underlying iron deficiency anemia felt to be secondary to GI related blood loss from esophageal varices. Serum ferritin is currently good at 136 ng per mL. I recommended repeat laboratory studies in 8 weeks at serum ferritin is following below 100 we will set her up for additional iron infusion at that time.  She is currently doing well not advised her should she have any needs or concerns prior to follow-up to give [name] a call.   Thrombocytopenia She has mild thrombocytopenia, which is most likely related from her liver disease and resultant splenomegaly. Counts are currently stable and we will continue to follow.     All questions were answered. The patient knows to call the clinic with any problems, questions or concerns. We can certainly see the patient much sooner if necessary.  Arvil Chaco 02/25/2014

## 2014-02-21 NOTE — Patient Instructions (Signed)
[facility] at [facility]  Discharge Instructions:  We will call you with the results of your iron when it is available. If you have any problems or concerns please call. We will see you back in 8 weeks to reassess your anemia. _______________________________________________________________  Thank you for choosing [facility] at [facility] to provide your oncology and hematology care.  To afford each patient quality time with our providers, please arrive at least 15 minutes before your scheduled appointment.  You need to re-schedule your appointment if you arrive 10 or more minutes late.  We strive to give you quality time with our providers, and arriving late affects you and other patients whose appointments are after yours.  Also, if you no show three or more times for appointments you may be dismissed from the clinic.  Again, thank you for choosing [facility] at [facility]. Our hope is that these requests will allow you access to exceptional care and in a timely manner. _______________________________________________________________  If you have questions after your visit, please contact our office at [phone] between the hours of 8:30 a.m. and 5:00 p.m. Voicemails left after 4:30 p.m. will not be returned until the following business day. _______________________________________________________________  For prescription refill requests, have your pharmacy contact our office. _______________________________________________________________  Recommendations made by the consultant and any test results will be sent to your referring physician. _______________________________________________________________

## 2014-02-21 NOTE — Progress Notes (Signed)
Kathryn [name] presented for labwork. Labs per MD order drawn via Peripheral Line 23 gauge needle inserted in right AC  Good blood return present. Procedure without incident.  Needle removed intact. Patient tolerated procedure well.

## 2014-02-22 LAB — FERRITIN: FERRITIN: 136 ng/mL (ref 10–291)

## 2014-02-25 NOTE — Assessment & Plan Note (Signed)
She has mild thrombocytopenia, which is most likely related from her liver disease and resultant splenomegaly. Counts are currently stable and we will continue to follow.

## 2014-02-25 NOTE — Assessment & Plan Note (Signed)
Pleasant 59 year old female with a history of Crohn's disease, hepatitis C recently treated with Harvoni. She has underlying iron deficiency anemia felt to be secondary to GI related blood loss from esophageal varices. Serum ferritin is currently good at 136 ng per mL. I recommended repeat laboratory studies in 8 weeks at serum ferritin is following below 100 we will set her up for additional iron infusion at that time.  She is currently doing well not advised her should she have any needs or concerns prior to follow-up to give [name] a call.

## 2014-02-26 ENCOUNTER — Other Ambulatory Visit: Payer: Self-pay | Admitting: Family

## 2014-02-28 ENCOUNTER — Ambulatory Visit (HOSPITAL_COMMUNITY): Payer: Medicare HMO

## 2014-03-01 ENCOUNTER — Other Ambulatory Visit: Payer: Self-pay | Admitting: *Deleted

## 2014-03-01 DIAGNOSIS — K7460 Unspecified cirrhosis of liver: Secondary | ICD-10-CM

## 2014-03-01 DIAGNOSIS — K769 Liver disease, unspecified: Secondary | ICD-10-CM

## 2014-03-01 DIAGNOSIS — E611 Iron deficiency: Secondary | ICD-10-CM

## 2014-03-01 DIAGNOSIS — K50919 Crohn's disease, unspecified, with unspecified complications: Secondary | ICD-10-CM

## 2014-03-01 DIAGNOSIS — K909 Intestinal malabsorption, unspecified: Secondary | ICD-10-CM

## 2014-03-01 DIAGNOSIS — D61818 Other pancytopenia: Secondary | ICD-10-CM

## 2014-03-01 DIAGNOSIS — D731 Hypersplenism: Secondary | ICD-10-CM

## 2014-03-01 NOTE — Telephone Encounter (Signed)
Patient is requesting a refill on tramadol last filled 01/22/2014

## 2014-03-04 MED ORDER — TRAMADOL HCL 50 MG PO TABS: 50.0000 mg | ORAL_TABLET | Freq: Four times a day (QID) | ORAL | Status: DC | PRN

## 2014-03-05 ENCOUNTER — Ambulatory Visit (HOSPITAL_COMMUNITY): Payer: Medicare HMO

## 2014-03-05 ENCOUNTER — Ambulatory Visit (HOSPITAL_COMMUNITY)
Admission: RE | Admit: 2014-03-05 | Discharge: 2014-03-05 | Disposition: A | Discharge status: Home / Self Care | Payer: Medicare HMO | Source: Ambulatory Visit | Attending: Hematology & Oncology | Admitting: Hematology & Oncology

## 2014-03-05 DIAGNOSIS — Z1231 Encounter for screening mammogram for malignant neoplasm of breast: Secondary | ICD-10-CM | POA: Insufficient documentation

## 2014-03-05 DIAGNOSIS — Z139 Encounter for screening, unspecified: Secondary | ICD-10-CM

## 2014-03-05 NOTE — Telephone Encounter (Signed)
Informed pt Rx refill of Tramadol at office to be picked up

## 2014-03-27 ENCOUNTER — Encounter (INDEPENDENT_AMBULATORY_CARE_PROVIDER_SITE_OTHER): Payer: Self-pay | Admitting: *Deleted

## 2014-03-27 ENCOUNTER — Other Ambulatory Visit (INDEPENDENT_AMBULATORY_CARE_PROVIDER_SITE_OTHER): Payer: Self-pay | Admitting: *Deleted

## 2014-03-27 DIAGNOSIS — D509 Iron deficiency anemia, unspecified: Secondary | ICD-10-CM

## 2014-03-27 DIAGNOSIS — K7469 Other cirrhosis of liver: Secondary | ICD-10-CM

## 2014-03-27 DIAGNOSIS — R600 Localized edema: Secondary | ICD-10-CM

## 2014-03-27 DIAGNOSIS — K5090 Crohn's disease, unspecified, without complications: Secondary | ICD-10-CM

## 2014-04-02 ENCOUNTER — Ambulatory Visit (INDEPENDENT_AMBULATORY_CARE_PROVIDER_SITE_OTHER): Payer: Medicare HMO | Admitting: Internal Medicine

## 2014-04-07 ENCOUNTER — Encounter (INDEPENDENT_AMBULATORY_CARE_PROVIDER_SITE_OTHER): Payer: Self-pay | Admitting: Internal Medicine

## 2014-04-07 ENCOUNTER — Encounter (INDEPENDENT_AMBULATORY_CARE_PROVIDER_SITE_OTHER): Payer: Self-pay | Admitting: *Deleted

## 2014-04-07 ENCOUNTER — Ambulatory Visit (INDEPENDENT_AMBULATORY_CARE_PROVIDER_SITE_OTHER): Payer: Medicare HMO | Admitting: Internal Medicine

## 2014-04-07 VITALS — BP 130/72 | HR 80 | Temp 97.9°F | Ht 65.5 in | Wt 249.9 lb

## 2014-04-07 DIAGNOSIS — B1920 Unspecified viral hepatitis C without hepatic coma: Secondary | ICD-10-CM

## 2014-04-07 DIAGNOSIS — L97911 Non-pressure chronic ulcer of unspecified part of right lower leg limited to breakdown of skin: Secondary | ICD-10-CM

## 2014-04-07 DIAGNOSIS — K7460 Unspecified cirrhosis of liver: Secondary | ICD-10-CM

## 2014-04-07 MED ORDER — FUROSEMIDE 20 MG PO TABS: 20.0000 mg | ORAL_TABLET | Freq: Every day | ORAL | Status: DC

## 2014-04-07 MED ORDER — SPIRONOLACTONE 25 MG PO TABS: 25.0000 mg | ORAL_TABLET | Freq: Two times a day (BID) | ORAL | Status: DC

## 2014-04-07 NOTE — Patient Instructions (Addendum)
Hepatic profile, CBC with diff, Hep C quaint. OV in 3 months. Lasix 20mg  daily Increase Spironolactone to 50mg  a day. 25mg  in am 25mg  in pm.  Call with a weight next week.

## 2014-04-07 NOTE — Progress Notes (Signed)
Subjective:    Patient ID: Kathryn [name], female    DOB: March 02, 1955, 59 y.o.   MRN: [id number]  HPI HPI Here today for f/u. Hx of Crohn's disease requiring multiple surgeries and now has an ileostomy. Diagnosed in 1985. 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  She completed antiviral therapy in August of this year.  She cleared to virus. She took Harvoni. In October of this year she underwent a transjuglar intrahepatic portosystemic shunt (TIPS) [facility]. Admitted to [facility] in October for GI bleed. EGD revealed portal gastropathy, normal terminal ileoscopy and small bowel given capsule did not reveal source of bleeding. She also c/o swelling in her lower extremities. She says as soon as she get up in the morning her legs will swell.  She says sometimes her left side is hard in the morning. She has had the swelling in her legs since before Christmas.   Her appetite is good. No weight loss. She has actually gained 2 pounds since her last visit in December. She is not exercising.  She is emptiyng her ileostomy bag around 5-6 times a day.  No melena or BRRB In December her Hep C quaint was undetected.    09/10/2013 US abdomen: IMPRESSION: No acute abnormality. Chronic splenomegaly. Chronic nodularity and coarse echogenicity of the liver parenchyma consistent with the history of chronic liver disease. CBC    Component Value Date/Time   WBC 4.9 02/17/2014 1258   WBC 4.7 12/23/2013 1601   RBC 3.58* 02/17/2014 1258   RBC 3.38* 01/09/2014 1600   RBC 3.75* 12/23/2013 1601   HGB 10.5* 02/17/2014 1258   HCT 32.5* 02/17/2014 1258   PLT 85* 02/17/2014 1258   MCV 90.8 02/17/2014 1258   MCH 29.3 02/17/2014 1258   MCH 26.4* 12/23/2013 1601   MCHC 32.3 02/17/2014 1258   MCHC 32.5 12/23/2013 1601   RDW 17.6* 02/17/2014 1258   RDW 16.3* 12/23/2013 1601   LYMPHSABS 1.6 02/17/2014 1258   LYMPHSABS 0.9 12/23/2013 1601   MONOABS 0.6 02/17/2014 1258   EOSABS 0.3 02/17/2014 1258   EOSABS  0.1 12/23/2013 1601   BASOSABS 0.0 02/17/2014 1258   BASOSABS 0.0 12/23/2013 1601   CMP Latest Ref Rng 01/28/2014 01/09/2014 12/17/2013  Glucose 70 - 99 mg/dL - 409(W) 119(J)  BUN 6 - 23 mg/dL - 13 17  Creatinine 4.78 - 1.10 mg/dL - 2.95(A) 2.13(Y)  Sodium 137 - 147 mEq/L - 134(L) 124(L)  Potassium 3.7 - 5.3 mEq/L - 3.3(L) 3.2(L)  Chloride 96 - 112 mEq/L - 101 94(L)  CO2 19 - 32 mEq/L - 22 19  Calcium 8.4 - 10.5 mg/dL - 8.8 8.6(V)  Total Protein 6.0 - 8.3 g/dL 7.8(I) 6.0 6.9(G)  Albumin 3.5 - 5.5 g/dL - - -  Total Bilirubin 0.2 - 1.2 mg/dL 2.9(B) 2.8(U) 1.3(H)  Alkaline Phos 39 - 117 U/L 198(H) 181(H) 185(H)  AST 0 - 37 U/L 32 33 33  ALT 0 - 35 U/L 17 16 36(H)      Review of Systems Past Medical History  Diagnosis Date  . Hypertension   . Enteritis presumed infectious   . Crohn's   . Crohn's   . Anemia   . Hepatitis C antibody test positive   . Hepatitis   . Renal insufficiency   . Cirrhosis   . Splenomegaly   . Bilateral leg edema 01/2014    Past Surgical History  Procedure Laterality Date  . Colon surgery  MULTIPLE SURGERIES FOR CROHNS  . Ileostomy      multiple abdominal surgeries for Crohn's by Dr. Gabriel Cirri  . Cholecystectomy    . Abscess drainage      abdominal  . Esophagogastroduodenoscopy  10/05/2010    Procedure: ESOPHAGOGASTRODUODENOSCOPY (EGD);  Surgeon: [name], MD;  [facility];  Service: Endoscopy;  Laterality: N/A;  7:30 am  . Cirrhosis    . Esophagogastroduodenoscopy N/A 09/11/2013    Procedure: ESOPHAGOGASTRODUODENOSCOPY (EGD);  Surgeon: [name], MD;  [facility];  Service: Endoscopy;  Laterality: N/A;  . Ileoscopy  09/11/2013    Procedure: ILEOSCOPY THROUGH STOMA;  Surgeon: [name], MD;  Location: [facility];  Service: Endoscopy;;  . Givens capsule study N/A 09/12/2013    Procedure: GIVENS CAPSULE STUDY;  Surgeon: [name], MD;  [facility]: [facility];  Service: Endoscopy;  Laterality: N/A;    . Givens capsule study N/A 11/25/2013    Procedure: GIVENS CAPSULE STUDY;  Surgeon: [name], MD;  [facility]: [facility];  Service: Endoscopy;  Laterality: N/A;  . Varices cauterization Right     in the stoma of her ileostomy  . Tips procedure      [facility] in October    Allergies  Allergen Reactions  . Penicillins Rash    Current Outpatient Prescriptions on File Prior to Visit  Medication Sig Dispense Refill  . acetaminophen (RA ACETAMINOPHEN) 650 MG CR tablet Take 1,300 mg by mouth 2 (two) times daily as needed.    . ALPRAZolam (XANAX) 1 MG tablet Take 1 tablet (1 mg total) by mouth at bedtime as needed for sleep. 30 tablet 3  . Cholecalciferol (VITAMIN D-1000 MAX ST) 1000 UNITS tablet Take 1,000 Units by mouth daily.    Marland Kitchen gabapentin (NEURONTIN) 100 MG capsule TAKE 1 CAPSULE BY MOUTH THREE TIMES DAILY 270 capsule 0  . Multiple Vitamins-Minerals (MULTIVITAMIN WITH MINERALS) tablet Take 1 tablet by mouth daily.    . nadolol (CORGARD) 40 MG tablet Take 40 mg by mouth daily.    . pantoprazole (PROTONIX) 40 MG tablet TAKE 1 TABLET BY MOUTH DAILY 30 tablet 3  . traMADol (ULTRAM) 50 MG tablet Take 1 tablet (50 mg total) by mouth every 6 (six) hours as needed. 30 tablet 0   No current facility-administered medications on file prior to visit.        Objective:   Physical Exam  Filed Vitals:   04/07/14 1432  Height: 5' 5.5" (1.664 m)  Weight: 249 lb 14.4 oz (113.354 kg)   Alert and oriented. Skin warm and dry. Oral mucosa is moist.   . Sclera anicteric, conjunctivae is pink. Thyroid not enlarged. No cervical lymphadenopathy. Lungs clear. Heart regular rate and rhythm.  Abdomen is soft. Bowel sounds are positive. No hepatomegaly. No abdominal masses felt. No tenderness.  3-4+ edema to lower extremities.  Ileostomy bag in place with green colored stool.  Patient is obese.        Assessment & Plan:  NAFLD/cirrhosis. Hepatitis C. She has cleared the virus. Lower extremity  edema.  Will increase Spironolactone to  a day. CBC, CMET, US abdomen for surveillance of her cirrhosis/Hep C. OV in 3 months.  Lasix  daily Must exercise.

## 2014-04-08 ENCOUNTER — Other Ambulatory Visit: Payer: Self-pay | Admitting: Family Medicine

## 2014-04-08 LAB — CBC WITH DIFFERENTIAL/PLATELET
Basophils Absolute: 0 10*3/uL (ref 0.0–0.1)
Basophils Relative: 1 % (ref 0–1)
Eosinophils Absolute: 0.2 10*3/uL (ref 0.0–0.7)
Eosinophils Relative: 6 % — ABNORMAL HIGH (ref 0–5)
HCT: 31.8 % — ABNORMAL LOW (ref 36.0–46.0)
Hemoglobin: 10.4 g/dL — ABNORMAL LOW (ref 12.0–15.0)
LYMPHS PCT: 30 % (ref 12–46)
Lymphs Abs: 1.1 10*3/uL (ref 0.7–4.0)
MCH: 31.3 pg (ref 26.0–34.0)
MCHC: 32.7 g/dL (ref 30.0–36.0)
MCV: 95.8 fL (ref 78.0–100.0)
MONO ABS: 0.5 10*3/uL (ref 0.1–1.0)
MONOS PCT: 13 % — AB (ref 3–12)
MPV: 9.5 fL (ref 8.6–12.4)
NEUTROS ABS: 1.8 10*3/uL (ref 1.7–7.7)
Neutrophils Relative %: 50 % (ref 43–77)
Platelets: 70 10*3/uL — ABNORMAL LOW (ref 150–400)
RBC: 3.32 MIL/uL — AB (ref 3.87–5.11)
RDW: 14.8 % (ref 11.5–15.5)
WBC: 3.5 10*3/uL — ABNORMAL LOW (ref 4.0–10.5)

## 2014-04-08 LAB — COMPREHENSIVE METABOLIC PANEL
ALBUMIN: 2.7 g/dL — AB (ref 3.5–5.2)
ALT: 15 U/L (ref 0–35)
AST: 26 U/L (ref 0–37)
Alkaline Phosphatase: 143 U/L — ABNORMAL HIGH (ref 39–117)
BILIRUBIN TOTAL: 1.6 mg/dL — AB (ref 0.2–1.2)
BUN: 16 mg/dL (ref 6–23)
CO2: 22 meq/L (ref 19–32)
Calcium: 8.4 mg/dL (ref 8.4–10.5)
Chloride: 110 mEq/L (ref 96–112)
Creat: 1.54 mg/dL — ABNORMAL HIGH (ref 0.50–1.10)
Glucose, Bld: 147 mg/dL — ABNORMAL HIGH (ref 70–99)
POTASSIUM: 3.6 meq/L (ref 3.5–5.3)
SODIUM: 139 meq/L (ref 135–145)
TOTAL PROTEIN: 5.3 g/dL — AB (ref 6.0–8.3)

## 2014-04-08 NOTE — Telephone Encounter (Signed)
Aware,tramadol script ready here.

## 2014-04-08 NOTE — Telephone Encounter (Signed)
Last filled 03/06/14, last seen by [facility] 02/13/14. Rx will print

## 2014-04-08 NOTE — Telephone Encounter (Signed)
Prescription sent to pharmacy.

## 2014-04-09 ENCOUNTER — Encounter (INDEPENDENT_AMBULATORY_CARE_PROVIDER_SITE_OTHER): Payer: Self-pay | Admitting: *Deleted

## 2014-04-09 ENCOUNTER — Telehealth (INDEPENDENT_AMBULATORY_CARE_PROVIDER_SITE_OTHER): Payer: Self-pay | Admitting: *Deleted

## 2014-04-09 ENCOUNTER — Other Ambulatory Visit (INDEPENDENT_AMBULATORY_CARE_PROVIDER_SITE_OTHER): Payer: Self-pay | Admitting: *Deleted

## 2014-04-09 DIAGNOSIS — K7460 Unspecified cirrhosis of liver: Secondary | ICD-10-CM

## 2014-04-09 NOTE — Telephone Encounter (Signed)
.  Per Kathryn [name] patient to have lab drawn in 2 weeks. Lab noted for 04/23/14. Letter sent to the patient as a reminder.

## 2014-04-14 ENCOUNTER — Ambulatory Visit (HOSPITAL_COMMUNITY)
Admission: RE | Admit: 2014-04-14 | Discharge: 2014-04-14 | Disposition: A | Discharge status: Home / Self Care | Payer: Medicare HMO | Source: Ambulatory Visit | Attending: Internal Medicine | Admitting: Internal Medicine

## 2014-04-14 DIAGNOSIS — R161 Splenomegaly, not elsewhere classified: Secondary | ICD-10-CM | POA: Insufficient documentation

## 2014-04-14 DIAGNOSIS — Z9049 Acquired absence of other specified parts of digestive tract: Secondary | ICD-10-CM | POA: Insufficient documentation

## 2014-04-14 DIAGNOSIS — B1920 Unspecified viral hepatitis C without hepatic coma: Secondary | ICD-10-CM | POA: Insufficient documentation

## 2014-04-14 DIAGNOSIS — L97911 Non-pressure chronic ulcer of unspecified part of right lower leg limited to breakdown of skin: Secondary | ICD-10-CM

## 2014-04-18 ENCOUNTER — Encounter (HOSPITAL_COMMUNITY): Payer: Self-pay | Admitting: Hematology & Oncology

## 2014-04-18 ENCOUNTER — Encounter (HOSPITAL_BASED_OUTPATIENT_CLINIC_OR_DEPARTMENT_OTHER): Payer: Medicare HMO

## 2014-04-18 ENCOUNTER — Encounter (HOSPITAL_COMMUNITY): Payer: Medicare HMO | Attending: Hematology and Oncology | Admitting: Hematology & Oncology

## 2014-04-18 VITALS — BP 128/49 | HR 74 | Temp 98.3°F | Resp 16 | Wt 241.8 lb

## 2014-04-18 DIAGNOSIS — R300 Dysuria: Secondary | ICD-10-CM

## 2014-04-18 DIAGNOSIS — K5090 Crohn's disease, unspecified, without complications: Secondary | ICD-10-CM | POA: Diagnosis not present

## 2014-04-18 DIAGNOSIS — K922 Gastrointestinal hemorrhage, unspecified: Secondary | ICD-10-CM | POA: Diagnosis not present

## 2014-04-18 DIAGNOSIS — D62 Acute posthemorrhagic anemia: Secondary | ICD-10-CM | POA: Diagnosis present

## 2014-04-18 DIAGNOSIS — E611 Iron deficiency: Secondary | ICD-10-CM

## 2014-04-18 DIAGNOSIS — D500 Iron deficiency anemia secondary to blood loss (chronic): Secondary | ICD-10-CM

## 2014-04-18 DIAGNOSIS — D61818 Other pancytopenia: Secondary | ICD-10-CM | POA: Diagnosis not present

## 2014-04-18 DIAGNOSIS — K50911 Crohn's disease, unspecified, with rectal bleeding: Secondary | ICD-10-CM

## 2014-04-18 LAB — CBC WITH DIFFERENTIAL/PLATELET
Basophils Absolute: 0 10*3/uL (ref 0.0–0.1)
Basophils Relative: 1 % (ref 0–1)
EOS ABS: 0.2 10*3/uL (ref 0.0–0.7)
Eosinophils Relative: 4 % (ref 0–5)
HEMATOCRIT: 29.4 % — AB (ref 36.0–46.0)
HEMOGLOBIN: 9.9 g/dL — AB (ref 12.0–15.0)
LYMPHS ABS: 1.1 10*3/uL (ref 0.7–4.0)
Lymphocytes Relative: 29 % (ref 12–46)
MCH: 31.4 pg (ref 26.0–34.0)
MCHC: 33.7 g/dL (ref 30.0–36.0)
MCV: 93.3 fL (ref 78.0–100.0)
MONO ABS: 0.5 10*3/uL (ref 0.1–1.0)
MONOS PCT: 13 % — AB (ref 3–12)
NEUTROS PCT: 53 % (ref 43–77)
Neutro Abs: 2 10*3/uL (ref 1.7–7.7)
Platelets: 70 10*3/uL — ABNORMAL LOW (ref 150–400)
RBC: 3.15 MIL/uL — ABNORMAL LOW (ref 3.87–5.11)
RDW: 14.8 % (ref 11.5–15.5)
WBC: 3.8 10*3/uL — ABNORMAL LOW (ref 4.0–10.5)

## 2014-04-18 LAB — IRON AND TIBC
Iron: 173 ug/dL — ABNORMAL HIGH (ref 42–145)
Saturation Ratios: 60 % — ABNORMAL HIGH (ref 20–55)
TIBC: 288 ug/dL (ref 250–470)
UIBC: 115 ug/dL — ABNORMAL LOW (ref 125–400)

## 2014-04-18 NOTE — Patient Instructions (Signed)
[facility] at [facility] Discharge Instructions  RECOMMENDATIONS MADE BY THE CONSULTANT AND ANY TEST RESULTS WILL BE SENT TO YOUR REFERRING PHYSICIAN.  Exam and discussion by Dr. [name] Report uncontrolled bleeding or other concerns  Follow-up in 3 months with labs and office visit.     Thank you for choosing [facility] at [facility] to provide your oncology and hematology care.  To afford each patient quality time with our provider, please arrive at least 15 minutes before your scheduled appointment time.    You need to re-schedule your appointment should you arrive 10 or more minutes late.  We strive to give you quality time with our providers, and arriving late affects you and other patients whose appointments are after yours.  Also, if you no show three or more times for appointments you may be dismissed from the clinic at the providers discretion.     Again, thank you for choosing [facility].  Our hope is that these requests will decrease the amount of time that you wait before being seen by our physicians.       _____________________________________________________________  Should you have questions after your visit to [facility], please contact our office at [phone] between the hours of 8:30 a.m. and 4:30 p.m.  Voicemails left after 4:30 p.m. will not be returned until the following business day.  For prescription refill requests, have your pharmacy contact our office.

## 2014-04-18 NOTE — Progress Notes (Signed)
Kathryn Heap, MD [address] [place] [address]  Pancytopenia  Esophageal Varices Hepatitis C with cirrhosis/splenomegaly. Treated with Harvoni in 09/2013 Crohn's disease s/p surgery with ileostomy Iron deficiency with Faraheme  on 01/13/2014 and 01/20/2014  CURRENT THERAPY: IV Faraheme  INTERVAL HISTORY: Kathryn [name] 59 y.o. female returns for follow-up of her iron deficiency. She has no major complaints today. She remains very positive. She is having no difficulties with bleeding. Her energy is baseline. Last iron infusions were in December of last year.   MEDICAL HISTORY: Past Medical History  Diagnosis Date  . Hypertension   . Enteritis presumed infectious   . Crohn's   . Crohn's   . Anemia   . Hepatitis C antibody test positive   . Hepatitis   . Renal insufficiency   . Cirrhosis   . Splenomegaly   . Bilateral leg edema 01/2014    has Hypertension; Crohn's disease; Acute blood loss anemia; Hepatic cirrhosis; Hepatitis C antibody test positive; Hepatitis C reactive; GERD (gastroesophageal reflux disease); Peripheral neuropathy; Insomnia; GI bleed; Thrombocytopenia; UTI (urinary tract infection); Coagulopathy; Exertional dyspnea; Weakness; GI bleeding; CKD (chronic kidney disease), stage III; Anemia of chronic disease; and Iron deficiency on her problem list.     No history exists.     is allergic to penicillins.  Kathryn [name] does not currently have medications on file.  SURGICAL HISTORY: Past Surgical History  Procedure Laterality Date  . Colon surgery      MULTIPLE SURGERIES FOR CROHNS  . Ileostomy      multiple abdominal surgeries for Crohn's by Dr. Gabriel Cirri  . Cholecystectomy    . Abscess drainage      abdominal  . Esophagogastroduodenoscopy  10/05/2010    Procedure: ESOPHAGOGASTRODUODENOSCOPY (EGD);  Surgeon: [name], MD;  [facility];  Service: Endoscopy;  Laterality: N/A;  7:30 am  . Cirrhosis    .  Esophagogastroduodenoscopy N/A 09/11/2013    Procedure: ESOPHAGOGASTRODUODENOSCOPY (EGD);  Surgeon: [name], MD;  [facility];  Service: Endoscopy;  Laterality: N/A;  . Ileoscopy  09/11/2013    Procedure: ILEOSCOPY THROUGH STOMA;  Surgeon: [name], MD;  [facility];  Service: Endoscopy;;  . Givens capsule study N/A 09/12/2013    Procedure: GIVENS CAPSULE STUDY;  Surgeon: [name], MD;  [facility];  Service: Endoscopy;  Laterality: N/A;  . Givens capsule study N/A 11/25/2013    Procedure: GIVENS CAPSULE STUDY;  Surgeon: [name], MD;  [facility];  Service: Endoscopy;  Laterality: N/A;  . Varices cauterization Right     in the stoma of her ileostomy  . Tips procedure      [facility] in October    SOCIAL HISTORY: History   Social History  . Marital Status: Married    Spouse Name: N/A  . Number of Children: N/A  . Years of Education: N/A   Occupational History  . Not on file.   Social History Main Topics  . Smoking status: Never Smoker   . Smokeless tobacco: Never Used  . Alcohol Use: No  . Drug Use: No  . Sexual Activity: Yes   Other Topics Concern  . Not on file   Social History Narrative    FAMILY HISTORY: Family History  Problem Relation Age of Onset  . Cancer Mother   . Stroke Father     Review of Systems  Constitutional: Positive for malaise/fatigue. Negative for  fever, chills and weight loss.  HENT: Negative for congestion, hearing loss, nosebleeds, sore throat and tinnitus.   Eyes: Negative for blurred vision, double vision, pain and discharge.  Respiratory: Negative for cough, hemoptysis, sputum production, shortness of breath and wheezing.   Cardiovascular: Positive for leg swelling. Negative for chest pain, palpitations, claudication and PND.  Gastrointestinal: Positive for diarrhea and constipation. Negative for heartburn, nausea, vomiting, abdominal pain, blood in stool and melena.    Genitourinary: Negative for dysuria, urgency, frequency and hematuria.  Musculoskeletal: Negative for myalgias, joint pain and falls.  Skin: Negative for itching and rash.  Neurological: Positive for weakness. Negative for dizziness, tingling, tremors, sensory change, speech change, focal weakness, seizures, loss of consciousness and headaches.  Endo/Heme/Allergies: Does not bruise/bleed easily.  Psychiatric/Behavioral: Negative for depression, suicidal ideas, memory loss and substance abuse. The patient is not nervous/anxious and does not have insomnia.     PHYSICAL EXAMINATION  ECOG PERFORMANCE STATUS: 0 - Asymptomatic  Filed Vitals:   04/18/14 1500  BP: 128/49  Pulse: 74  Temp: 98.3 F (36.8 C)  Resp: 16    Physical Exam  Constitutional: She is oriented to person, place, and time and well-developed, well-nourished, and in no distress.  HENT:  Head: Normocephalic and atraumatic.  Nose: Nose normal.  Mouth/Throat: Oropharynx is clear and moist. No oropharyngeal exudate.  Eyes: Conjunctivae and EOM are normal. Pupils are equal, round, and reactive to light. Right eye exhibits no discharge. Left eye exhibits no discharge. No scleral icterus.  Neck: Normal range of motion. Neck supple. No tracheal deviation present. No thyromegaly present.  Cardiovascular: Normal rate, regular rhythm and normal heart sounds.  Exam reveals no gallop and no friction rub.   No murmur heard. Pulmonary/Chest: Effort normal and breath sounds normal. She has no wheezes. She has no rales.  Abdominal: Soft. Bowel sounds are normal. She exhibits no distension and no mass. There is no tenderness. There is no rebound and no guarding.  ileostomy  Musculoskeletal: Normal range of motion. She exhibits edema.  Lymphadenopathy:    She has no cervical adenopathy.  Neurological: She is alert and oriented to person, place, and time. She has normal reflexes. No cranial nerve deficit. Gait normal. Coordination normal.   Skin: Skin is warm and dry. No rash noted.  Psychiatric: Mood, memory, affect and judgment normal.  Nursing note and vitals reviewed.   LABORATORY DATA:  CBC    Component Value Date/Time   WBC 3.5* 04/07/2014 1514   WBC 4.7 12/23/2013 1601   RBC 3.32* 04/07/2014 1514   RBC 3.38* 01/09/2014 1600   RBC 3.75* 12/23/2013 1601   HGB 10.4* 04/07/2014 1514   HCT 31.8* 04/07/2014 1514   PLT 70* 04/07/2014 1514   MCV 95.8 04/07/2014 1514   MCH 31.3 04/07/2014 1514   MCH 26.4* 12/23/2013 1601   MCHC 32.7 04/07/2014 1514   MCHC 32.5 12/23/2013 1601   RDW 14.8 04/07/2014 1514   RDW 16.3* 12/23/2013 1601   LYMPHSABS 1.1 04/07/2014 1514   LYMPHSABS 0.9 12/23/2013 1601   MONOABS 0.5 04/07/2014 1514   EOSABS 0.2 04/07/2014 1514   EOSABS 0.1 12/23/2013 1601   BASOSABS 0.0 04/07/2014 1514   BASOSABS 0.0 12/23/2013 1601   CMP     Component Value Date/Time   NA 139 04/07/2014 1448   NA 137 07/04/2013 0908   K 3.6 04/07/2014 1448   CL 110 04/07/2014 1448   CO2 22 04/07/2014 1448   GLUCOSE 147* 04/07/2014 1448  GLUCOSE 103* 07/04/2013 0908   BUN 16 04/07/2014 1448   BUN 14 07/04/2013 0908   CREATININE 1.54* 04/07/2014 1448   CREATININE 1.21* 01/09/2014 1600   CALCIUM 8.4 04/07/2014 1448   PROT 5.3* 04/07/2014 1448   PROT 6.1 07/04/2013 0908   ALBUMIN 2.7* 04/07/2014 1448   AST 26 04/07/2014 1448   ALT 15 04/07/2014 1448   ALKPHOS 143* 04/07/2014 1448   BILITOT 1.6* 04/07/2014 1448   GFRNONAA 49* 01/09/2014 1600   GFRAA 56* 01/09/2014 1600      ASSESSMENT and THERAPY PLAN:   Pancytopenia Esophageal Varices Hepatitis C with cirrhosis/splenomegaly. Treated with Harvoni in 09/2013 Crohn's disease s/p surgery with ileostomy Iron deficiency with Faraheme 510mg  on 01/13/2014 and 01/20/2014  She is doing fairly well. Unchanged from her prior visit. We will continue with observation of her counts and iron requirements. She does have difficulty tolerating oral iron and therefore  IV is most likely her best option for maintaining adequate iron stores. I have advised her if she develops excessive fatigue or problems prior to follow-up she can come in for a blood count and ferritin level any time.  All questions were answered. The patient knows to call the clinic with any problems, questions or concerns. We can certainly see the patient much sooner if necessary.  Arvil Chaco 04/18/2014

## 2014-04-18 NOTE — Progress Notes (Signed)
Kathryn [name] presented for labwork. Labs per MD order drawn via Peripheral Line 23 gauge needle inserted in right AC  Good blood return present. Procedure without incident.  Needle removed intact. Patient tolerated procedure well.   

## 2014-04-19 LAB — FERRITIN: Ferritin: 159 ng/mL (ref 10–291)

## 2014-04-25 ENCOUNTER — Other Ambulatory Visit: Payer: Self-pay | Admitting: Family Medicine

## 2014-05-13 LAB — CBC WITH DIFFERENTIAL/PLATELET
BASOS ABS: 0 10*3/uL (ref 0.0–0.1)
Basophils Relative: 1 % (ref 0–1)
EOS PCT: 4 % (ref 0–5)
Eosinophils Absolute: 0.2 10*3/uL (ref 0.0–0.7)
HCT: 31.8 % — ABNORMAL LOW (ref 36.0–46.0)
Hemoglobin: 10.5 g/dL — ABNORMAL LOW (ref 12.0–15.0)
LYMPHS ABS: 1.1 10*3/uL (ref 0.7–4.0)
Lymphocytes Relative: 24 % (ref 12–46)
MCH: 30.2 pg (ref 26.0–34.0)
MCHC: 33 g/dL (ref 30.0–36.0)
MCV: 91.4 fL (ref 78.0–100.0)
MONO ABS: 0.5 10*3/uL (ref 0.1–1.0)
MPV: 9.9 fL (ref 8.6–12.4)
Monocytes Relative: 11 % (ref 3–12)
Neutro Abs: 2.6 10*3/uL (ref 1.7–7.7)
Neutrophils Relative %: 60 % (ref 43–77)
PLATELETS: 81 10*3/uL — AB (ref 150–400)
RBC: 3.48 MIL/uL — AB (ref 3.87–5.11)
RDW: 14.5 % (ref 11.5–15.5)
WBC: 4.4 10*3/uL (ref 4.0–10.5)

## 2014-05-13 LAB — COMPREHENSIVE METABOLIC PANEL
ALT: 13 U/L (ref 0–35)
AST: 24 U/L (ref 0–37)
Albumin: 2.8 g/dL — ABNORMAL LOW (ref 3.5–5.2)
Alkaline Phosphatase: 158 U/L — ABNORMAL HIGH (ref 39–117)
BUN: 18 mg/dL (ref 6–23)
CALCIUM: 7.9 mg/dL — AB (ref 8.4–10.5)
CHLORIDE: 109 meq/L (ref 96–112)
CO2: 20 mEq/L (ref 19–32)
Creat: 1.3 mg/dL — ABNORMAL HIGH (ref 0.50–1.10)
Glucose, Bld: 117 mg/dL — ABNORMAL HIGH (ref 70–99)
Potassium: 3.6 mEq/L (ref 3.5–5.3)
Sodium: 137 mEq/L (ref 135–145)
Total Bilirubin: 1.1 mg/dL (ref 0.2–1.2)
Total Protein: 5.5 g/dL — ABNORMAL LOW (ref 6.0–8.3)

## 2014-05-23 ENCOUNTER — Other Ambulatory Visit: Payer: Self-pay | Admitting: *Deleted

## 2014-05-23 NOTE — Telephone Encounter (Signed)
Last filled 04/09/14, last seen 02/13/14 by [facility]. Rx will print

## 2014-05-26 MED ORDER — TRAMADOL HCL 50 MG PO TABS: ORAL_TABLET | ORAL | Status: DC

## 2014-05-26 NOTE — Telephone Encounter (Signed)
Ultram rx ready for pick up  

## 2014-05-26 NOTE — Telephone Encounter (Signed)
Patient aware rx ready to be picked up 

## 2014-06-06 ENCOUNTER — Other Ambulatory Visit (INDEPENDENT_AMBULATORY_CARE_PROVIDER_SITE_OTHER): Payer: Self-pay | Admitting: Internal Medicine

## 2014-06-09 ENCOUNTER — Encounter (HOSPITAL_COMMUNITY): Payer: Self-pay

## 2014-06-09 ENCOUNTER — Inpatient Hospital Stay (HOSPITAL_COMMUNITY)
Admission: EM | Admit: 2014-06-09 | Discharge: 2014-06-12 | DRG: 194 | Disposition: A | Discharge status: Home / Self Care | Payer: Medicare HMO | Attending: Internal Medicine | Admitting: Internal Medicine

## 2014-06-09 ENCOUNTER — Ambulatory Visit (INDEPENDENT_AMBULATORY_CARE_PROVIDER_SITE_OTHER): Payer: Medicare HMO | Admitting: Internal Medicine

## 2014-06-09 ENCOUNTER — Emergency Department (HOSPITAL_COMMUNITY): Payer: Medicare HMO

## 2014-06-09 ENCOUNTER — Other Ambulatory Visit (INDEPENDENT_AMBULATORY_CARE_PROVIDER_SITE_OTHER): Payer: Self-pay | Admitting: Internal Medicine

## 2014-06-09 DIAGNOSIS — N390 Urinary tract infection, site not specified: Secondary | ICD-10-CM | POA: Diagnosis present

## 2014-06-09 DIAGNOSIS — D696 Thrombocytopenia, unspecified: Secondary | ICD-10-CM | POA: Diagnosis present

## 2014-06-09 DIAGNOSIS — Z823 Family history of stroke: Secondary | ICD-10-CM | POA: Diagnosis not present

## 2014-06-09 DIAGNOSIS — Z6839 Body mass index (BMI) 39.0-39.9, adult: Secondary | ICD-10-CM

## 2014-06-09 DIAGNOSIS — E669 Obesity, unspecified: Secondary | ICD-10-CM | POA: Diagnosis present

## 2014-06-09 DIAGNOSIS — N189 Chronic kidney disease, unspecified: Secondary | ICD-10-CM

## 2014-06-09 DIAGNOSIS — K7460 Unspecified cirrhosis of liver: Secondary | ICD-10-CM

## 2014-06-09 DIAGNOSIS — Z932 Ileostomy status: Secondary | ICD-10-CM

## 2014-06-09 DIAGNOSIS — Z809 Family history of malignant neoplasm, unspecified: Secondary | ICD-10-CM

## 2014-06-09 DIAGNOSIS — N183 Chronic kidney disease, stage 3 unspecified: Secondary | ICD-10-CM | POA: Diagnosis present

## 2014-06-09 DIAGNOSIS — D649 Anemia, unspecified: Secondary | ICD-10-CM

## 2014-06-09 DIAGNOSIS — E869 Volume depletion, unspecified: Secondary | ICD-10-CM | POA: Diagnosis present

## 2014-06-09 DIAGNOSIS — J189 Pneumonia, unspecified organism: Principal | ICD-10-CM | POA: Diagnosis present

## 2014-06-09 DIAGNOSIS — R0602 Shortness of breath: Secondary | ICD-10-CM | POA: Diagnosis present

## 2014-06-09 DIAGNOSIS — N289 Disorder of kidney and ureter, unspecified: Secondary | ICD-10-CM

## 2014-06-09 DIAGNOSIS — D638 Anemia in other chronic diseases classified elsewhere: Secondary | ICD-10-CM | POA: Diagnosis present

## 2014-06-09 DIAGNOSIS — R109 Unspecified abdominal pain: Secondary | ICD-10-CM

## 2014-06-09 DIAGNOSIS — I129 Hypertensive chronic kidney disease with stage 1 through stage 4 chronic kidney disease, or unspecified chronic kidney disease: Secondary | ICD-10-CM | POA: Diagnosis present

## 2014-06-09 DIAGNOSIS — E876 Hypokalemia: Secondary | ICD-10-CM | POA: Diagnosis present

## 2014-06-09 DIAGNOSIS — K219 Gastro-esophageal reflux disease without esophagitis: Secondary | ICD-10-CM

## 2014-06-09 DIAGNOSIS — L97911 Non-pressure chronic ulcer of unspecified part of right lower leg limited to breakdown of skin: Secondary | ICD-10-CM

## 2014-06-09 DIAGNOSIS — E871 Hypo-osmolality and hyponatremia: Secondary | ICD-10-CM | POA: Diagnosis present

## 2014-06-09 DIAGNOSIS — J9801 Acute bronchospasm: Secondary | ICD-10-CM | POA: Diagnosis present

## 2014-06-09 DIAGNOSIS — B182 Chronic viral hepatitis C: Secondary | ICD-10-CM | POA: Diagnosis present

## 2014-06-09 DIAGNOSIS — D631 Anemia in chronic kidney disease: Secondary | ICD-10-CM | POA: Diagnosis present

## 2014-06-09 DIAGNOSIS — N179 Acute kidney failure, unspecified: Secondary | ICD-10-CM | POA: Diagnosis present

## 2014-06-09 LAB — CBC
HCT: 31.4 % — ABNORMAL LOW (ref 36.0–46.0)
HEMOGLOBIN: 10.5 g/dL — AB (ref 12.0–15.0)
MCH: 31.5 pg (ref 26.0–34.0)
MCHC: 33.4 g/dL (ref 30.0–36.0)
MCV: 94.3 fL (ref 78.0–100.0)
Platelets: 70 10*3/uL — ABNORMAL LOW (ref 150–400)
RBC: 3.33 MIL/uL — ABNORMAL LOW (ref 3.87–5.11)
RDW: 16 % — AB (ref 11.5–15.5)
WBC: 7.8 10*3/uL (ref 4.0–10.5)

## 2014-06-09 LAB — DIFFERENTIAL
BASOS ABS: 0 10*3/uL (ref 0.0–0.1)
Basophils Relative: 0 % (ref 0–1)
Eosinophils Absolute: 0 10*3/uL (ref 0.0–0.7)
Eosinophils Relative: 1 % (ref 0–5)
Lymphocytes Relative: 11 % — ABNORMAL LOW (ref 12–46)
Lymphs Abs: 0.7 10*3/uL (ref 0.7–4.0)
Monocytes Absolute: 0.7 10*3/uL (ref 0.1–1.0)
Monocytes Relative: 12 % (ref 3–12)
NEUTROS PCT: 76 % (ref 43–77)
Neutro Abs: 4.8 10*3/uL (ref 1.7–7.7)

## 2014-06-09 LAB — CLOSTRIDIUM DIFFICILE BY PCR: Toxigenic C. Difficile by PCR: NEGATIVE

## 2014-06-09 LAB — URINE MICROSCOPIC-ADD ON

## 2014-06-09 LAB — BASIC METABOLIC PANEL
ANION GAP: 6 (ref 5–15)
BUN: 29 mg/dL — AB (ref 6–20)
CHLORIDE: 102 mmol/L (ref 101–111)
CO2: 24 mmol/L (ref 22–32)
Calcium: 8.2 mg/dL — ABNORMAL LOW (ref 8.9–10.3)
Creatinine, Ser: 1.86 mg/dL — ABNORMAL HIGH (ref 0.44–1.00)
GFR calc non Af Amer: 29 mL/min — ABNORMAL LOW (ref >60)
GFR, EST AFRICAN AMERICAN: 33 mL/min — AB (ref >60)
GLUCOSE: 130 mg/dL — AB (ref 70–99)
Potassium: 3.2 mmol/L — ABNORMAL LOW (ref 3.5–5.1)
SODIUM: 132 mmol/L — AB (ref 135–145)

## 2014-06-09 LAB — URINALYSIS, ROUTINE W REFLEX MICROSCOPIC
BILIRUBIN URINE: NEGATIVE
GLUCOSE, UA: NEGATIVE mg/dL
Ketones, ur: NEGATIVE mg/dL
NITRITE: NEGATIVE
Specific Gravity, Urine: 1.015 (ref 1.005–1.030)
Urobilinogen, UA: 0.2 mg/dL (ref 0.0–1.0)
pH: 6 (ref 5.0–8.0)

## 2014-06-09 LAB — MRSA PCR SCREENING: MRSA BY PCR: NEGATIVE

## 2014-06-09 LAB — I-STAT CG4 LACTIC ACID, ED: Lactic Acid, Venous: 1.9 mmol/L (ref 0.5–2.0)

## 2014-06-09 LAB — STREP PNEUMONIAE URINARY ANTIGEN: STREP PNEUMO URINARY ANTIGEN: NEGATIVE

## 2014-06-09 MED ORDER — ACETAMINOPHEN 650 MG RE SUPP: 650.0000 mg | Freq: Four times a day (QID) | RECTAL | Status: DC | PRN

## 2014-06-09 MED ORDER — POTASSIUM CHLORIDE CRYS ER 20 MEQ PO TBCR
40.0000 meq | EXTENDED_RELEASE_TABLET | Freq: Once | ORAL | Status: AC
  Administered 2014-06-09: 40 meq via ORAL
  Filled 2014-06-09: qty 2

## 2014-06-09 MED ORDER — AZITHROMYCIN 500 MG IV SOLR
500.0000 mg | INTRAVENOUS | Status: DC
  Administered 2014-06-10: 500 mg via INTRAVENOUS
  Filled 2014-06-09 (×2): qty 500

## 2014-06-09 MED ORDER — TRAMADOL HCL 50 MG PO TABS
50.0000 mg | ORAL_TABLET | Freq: Four times a day (QID) | ORAL | Status: DC | PRN
  Administered 2014-06-09 – 2014-06-11 (×3): 50 mg via ORAL
  Filled 2014-06-09 (×3): qty 1

## 2014-06-09 MED ORDER — PANTOPRAZOLE SODIUM 40 MG PO TBEC: 40.0000 mg | DELAYED_RELEASE_TABLET | Freq: Every day | ORAL | Status: DC

## 2014-06-09 MED ORDER — DEXTROSE 5 % IV SOLN
1.0000 g | Freq: Once | INTRAVENOUS | Status: AC
  Administered 2014-06-09: 1 g via INTRAVENOUS
  Filled 2014-06-09: qty 10

## 2014-06-09 MED ORDER — DEXTROSE 5 % IV SOLN
1.0000 g | INTRAVENOUS | Status: DC
  Administered 2014-06-10 – 2014-06-12 (×3): 1 g via INTRAVENOUS
  Filled 2014-06-09 (×4): qty 10

## 2014-06-09 MED ORDER — ACETAMINOPHEN 500 MG PO TABS
500.0000 mg | ORAL_TABLET | Freq: Four times a day (QID) | ORAL | Status: DC | PRN
  Filled 2014-06-09: qty 1

## 2014-06-09 MED ORDER — ONDANSETRON HCL 4 MG/2ML IJ SOLN: 4.0000 mg | Freq: Four times a day (QID) | INTRAMUSCULAR | Status: DC | PRN

## 2014-06-09 MED ORDER — ONDANSETRON HCL 4 MG PO TABS: 4.0000 mg | ORAL_TABLET | Freq: Four times a day (QID) | ORAL | Status: DC | PRN

## 2014-06-09 MED ORDER — IPRATROPIUM-ALBUTEROL 0.5-2.5 (3) MG/3ML IN SOLN
3.0000 mL | Freq: Once | RESPIRATORY_TRACT | Status: AC
  Administered 2014-06-09: 3 mL via RESPIRATORY_TRACT
  Filled 2014-06-09: qty 3

## 2014-06-09 MED ORDER — ALPRAZOLAM 1 MG PO TABS
1.0000 mg | ORAL_TABLET | Freq: Every evening | ORAL | Status: DC | PRN
  Administered 2014-06-09 – 2014-06-11 (×3): 1 mg via ORAL
  Filled 2014-06-09 (×3): qty 1

## 2014-06-09 MED ORDER — GABAPENTIN 100 MG PO CAPS
100.0000 mg | ORAL_CAPSULE | Freq: Three times a day (TID) | ORAL | Status: DC
  Administered 2014-06-09 – 2014-06-12 (×9): 100 mg via ORAL
  Filled 2014-06-09 (×9): qty 1

## 2014-06-09 MED ORDER — PANTOPRAZOLE SODIUM 40 MG PO TBEC
40.0000 mg | DELAYED_RELEASE_TABLET | Freq: Every day | ORAL | Status: DC
  Administered 2014-06-09 – 2014-06-12 (×4): 40 mg via ORAL
  Filled 2014-06-09 (×4): qty 1

## 2014-06-09 MED ORDER — SODIUM CHLORIDE 0.9 % IV SOLN: INTRAVENOUS | Status: DC

## 2014-06-09 MED ORDER — AZITHROMYCIN 500 MG IV SOLR
500.0000 mg | Freq: Once | INTRAVENOUS | Status: AC
  Administered 2014-06-09: 500 mg via INTRAVENOUS
  Filled 2014-06-09: qty 500

## 2014-06-09 MED ORDER — NADOLOL 40 MG PO TABS
40.0000 mg | ORAL_TABLET | Freq: Every day | ORAL | Status: DC
  Administered 2014-06-09 – 2014-06-12 (×4): 40 mg via ORAL
  Filled 2014-06-09 (×5): qty 1

## 2014-06-09 MED ORDER — ALBUTEROL SULFATE (2.5 MG/3ML) 0.083% IN NEBU: 2.5000 mg | INHALATION_SOLUTION | RESPIRATORY_TRACT | Status: DC | PRN

## 2014-06-09 MED ORDER — SODIUM CHLORIDE 0.9 % IV SOLN: 1000.0000 mL | INTRAVENOUS | Status: DC

## 2014-06-09 MED ORDER — IPRATROPIUM-ALBUTEROL 0.5-2.5 (3) MG/3ML IN SOLN
3.0000 mL | Freq: Three times a day (TID) | RESPIRATORY_TRACT | Status: DC
Start: 2014-06-09 — End: 2014-06-12
  Administered 2014-06-09 – 2014-06-11 (×7): 3 mL via RESPIRATORY_TRACT
  Filled 2014-06-09 (×7): qty 3

## 2014-06-09 MED ORDER — ALBUTEROL SULFATE (2.5 MG/3ML) 0.083% IN NEBU
2.5000 mg | INHALATION_SOLUTION | Freq: Four times a day (QID) | RESPIRATORY_TRACT | Status: DC | PRN
  Administered 2014-06-12: 2.5 mg via RESPIRATORY_TRACT
  Filled 2014-06-09: qty 3

## 2014-06-09 MED ORDER — POTASSIUM CHLORIDE IN NACL 20-0.9 MEQ/L-% IV SOLN
INTRAVENOUS | Status: DC
  Administered 2014-06-09 – 2014-06-11 (×4): via INTRAVENOUS

## 2014-06-09 MED ORDER — SODIUM CHLORIDE 0.9 % IV SOLN
1000.0000 mL | Freq: Once | INTRAVENOUS | Status: AC
  Administered 2014-06-09: 1000 mL via INTRAVENOUS

## 2014-06-09 NOTE — Progress Notes (Signed)
C Diff and MRSA PCR returned negative. Pt taken of enteric and contact precautions. Pt currently remains on droplet precautions.

## 2014-06-09 NOTE — ED Provider Notes (Signed)
CSN: [id number]     Arrival date & time 06/09/14  0242 History   First MD Initiated Contact with Patient 06/09/14 0501     Chief Complaint  Patient presents with  . Cough     (Consider location/radiation/quality/duration/timing/severity/associated sxs/prior Treatment) Patient is a 59 y.o. female presenting with cough. The history is provided by the patient.  Cough She has been coughing the last 3 days with symptoms getting progressively worse. She was running fever at home as high as 103.2. She did have some chills but no sweats. She is complaining of soreness in her chest when she coughs. She denies dyspnea. There's been nausea but no vomiting. She denies arthralgias or myalgias. There've been no known sick contacts. She states that she has not taken anything for the cough because everything she found had ibuprofen in it and she cannot take ibuprofen because of known esophageal varices. She has had the pneumococcal vaccine given.  Past Medical History  Diagnosis Date  . Hypertension   . Enteritis presumed infectious   . Crohn's   . Crohn's   . Anemia   . Hepatitis C antibody test positive   . Hepatitis   . Renal insufficiency   . Cirrhosis   . Splenomegaly   . Bilateral leg edema 01/2014   Past Surgical History  Procedure Laterality Date  . Colon surgery      MULTIPLE SURGERIES FOR CROHNS  . Ileostomy      multiple abdominal surgeries for Crohn's by Dr. Gabriel Cirri  . Cholecystectomy    . Abscess drainage      abdominal  . Esophagogastroduodenoscopy  10/05/2010    Procedure: ESOPHAGOGASTRODUODENOSCOPY (EGD);  Surgeon: [name], MD;  [facility];  Service: Endoscopy;  Laterality: N/A;  7:30 am  . Cirrhosis    . Esophagogastroduodenoscopy N/A 09/11/2013    Procedure: ESOPHAGOGASTRODUODENOSCOPY (EGD);  Surgeon: [name], MD;  Location: [facility];  Service: Endoscopy;  Laterality: N/A;  . Ileoscopy  09/11/2013    Procedure: ILEOSCOPY THROUGH STOMA;   Surgeon: [name], MD;  Location: [facility];  Service: Endoscopy;;  . Givens capsule study N/A 09/12/2013    Procedure: GIVENS CAPSULE STUDY;  Surgeon: [name], MD;  [facility]: [facility];  Service: Endoscopy;  Laterality: N/A;  . Givens capsule study N/A 11/25/2013    Procedure: GIVENS CAPSULE STUDY;  Surgeon: [name], MD;  [facility]: [facility];  Service: Endoscopy;  Laterality: N/A;  . Varices cauterization Right     in the stoma of her ileostomy  . Tips procedure      [facility] in October   Family History  Problem Relation Age of Onset  . Cancer Mother   . Stroke Father    History  Substance Use Topics  . Smoking status: Never Smoker   . Smokeless tobacco: Never Used  . Alcohol Use: No   OB History    No data available     Review of Systems  Respiratory: Positive for cough.   All other systems reviewed and are negative.     Allergies  Penicillins  Home Medications   Prior to Admission medications   Medication Sig Start Date End Date Taking? Authorizing Provider  acetaminophen (RA ACETAMINOPHEN) 650 MG CR tablet Take 1,300 mg by mouth 2 (two) times daily as needed.    Historical Provider, MD  ALPRAZolam Prudy Feeler) 1 MG tablet Take 1 tablet (1 mg total) by mouth at bedtime as needed  for sleep. 02/13/14   Deatra Canter, FNP  Cholecalciferol (VITAMIN D-1000 MAX ST) 1000 UNITS tablet Take 1,000 Units by mouth daily.    Historical Provider, MD  furosemide (LASIX) 20 MG tablet Take 1 tablet (20 mg total) by mouth daily. 04/07/14   Len Blalock, NP  gabapentin (NEURONTIN) 100 MG capsule TAKE 1 CAPSULE BY MOUTH THREE TIMES DAILY 02/26/14   Deatra Canter, FNP  Multiple Vitamins-Minerals (MULTIVITAMIN WITH MINERALS) tablet Take 1 tablet by mouth daily.    Historical Provider, MD  nadolol (CORGARD) 40 MG tablet Take 40 mg by mouth daily. 12/07/13   Historical Provider, MD  nadolol (CORGARD) 40 MG tablet TAKE 1 TABLET BY MOUTH EVERY DAY 04/28/14   Ernestina Penna, MD  pantoprazole (PROTONIX) 40 MG tablet TAKE 1 TABLET BY MOUTH DAILY 02/03/14   Len Blalock, NP  spironolactone (ALDACTONE) 25 MG tablet Take 1 tablet (25 mg total) by mouth 2 (two) times daily. 04/07/14   Len Blalock, NP  traMADol (ULTRAM) 50 MG tablet TAKE 1 TABLET BY MOUTH EVERY SIX HOURS AS NEEDED 05/26/14   Mary-Margaret Daphine Deutscher, FNP   BP 124/55 mmHg  Pulse 80  Temp(Src) 101.2 F (38.4 C) (Oral)  Resp 20  Ht 5' 5.5" (1.664 m)  Wt 240 lb (108.863 kg)  BMI 39.32 kg/m2  SpO2 94% Physical Exam  Nursing note and vitals reviewed.  59 year old female, resting comfortably and in no acute distress. Vital signs are significant for fever. Oxygen saturation is 94%, which is normal. Head is normocephalic and atraumatic. PERRLA, EOMI. Oropharynx is clear. Neck is nontender and supple without adenopathy or JVD. Back is nontender and there is no CVA tenderness. Lungs have diffuse expiratory wheezes and faint rales are present in the left lateral lung fields. Chest is nontender. Heart has regular rate and rhythm without murmur. Abdomen is soft, flat, nontender without masses or hepatosplenomegaly and peristalsis is normoactive. Extremities have 1+ edema, full range of motion is present. Skin is warm and dry without rash. Neurologic: Mental status is normal, cranial nerves are intact, there are no motor or sensory deficits.  ED Course  Procedures (including critical care time) Labs Review Labs Reviewed  BASIC METABOLIC PANEL - Abnormal; Notable for the following:    Sodium 132 (*)    Potassium 3.2 (*)    Glucose, Bld 130 (*)    BUN 29 (*)    Creatinine, Ser 1.86 (*)    Calcium 8.2 (*)    GFR calc non Af Amer 29 (*)    GFR calc Af Amer 33 (*)    All other components within normal limits  CBC - Abnormal; Notable for the following:    RBC 3.33 (*)    Hemoglobin 10.5 (*)    HCT 31.4 (*)    RDW 16.0 (*)    Platelets 70 (*)    All other components within normal limits   CULTURE, BLOOD (ROUTINE X 2)  CULTURE, BLOOD (ROUTINE X 2)  DIFFERENTIAL  I-STAT CG4 LACTIC ACID, ED    Imaging Review Dg Chest 2 View (if Patient Has Fever And/or Copd)  06/09/2014   CLINICAL DATA:  Cough, congestion, fever  EXAM: CHEST  2 VIEW  COMPARISON:  09/30/2013.  FINDINGS: There is patchy airspace opacity in the lingula, new. This may represent pneumonia. The right lung is clear. There is no pleural effusion. Pulmonary vasculature is normal. There is mildly prominent opacity and mildly prominent contour in the  left hilum and this could represent reactive adenopathy.  IMPRESSION: Lingular infiltrate, likely pneumonia in this clinical setting. Followup PA and lateral chest X-ray is recommended in 3-4 weeks following trial of antibiotic therapy to ensure resolution and exclude underlying malignancy.   Electronically Signed   By: Ellery Plunk M.D.   On: 06/09/2014 04:18   Images viewed by me.  MDM   Final diagnoses:  Community acquired pneumonia  Acute on chronic renal insufficiency  Hypokalemia  Hyponatremia  Normochromic normocytic anemia  Hepatic cirrhosis, unspecified hepatic cirrhosis type    Community-acquired pneumonia with associated bronchospasm. She is given albuterol with ipratropium nebulizer treatment. Laboratory workup shows mild hyponatremia not significant changed from baseline, and also hypokalemia. She's given oral potassium. She does have history of renal insufficiency with slight worsening. She is given IV fluids. She does not appear toxic but given fever, will screen for sepsis with lactic acid. Antibiotics are initiated for community-acquired pneumonia. Because of her underlying condition of cirrhosis, she will need to begin treatment as an inpatient. Old records were reviewed confirming that she is being followed for known history of Crohn's disease and hepatitis C with cirrhosis.  She feels significantly better after nebulizer treatment. On reexam, wheezing  was significantly decreased. Lactic acid is normal. Case is discussed with Dr. Conley Rolls [facility] who agrees to admit the patient.  Dione Booze, MD 06/09/14 [id number]

## 2014-06-09 NOTE — Telephone Encounter (Signed)
Rx sent to her pharmacy 

## 2014-06-09 NOTE — ED Notes (Signed)
Pt ambulatory to the bathroom.  Denies any complaints at this time.

## 2014-06-09 NOTE — H&P (Signed)
History and Physical  Kathryn [name] PPI:[id number] DOB: [birth year] DOA: 06/09/2014   PCP: [name], MD   Chief Complaint: cough and sob  HPI:  59 year old female with a history of hypertension, Crohn's ileitis status post ileostomy, liver cirrhosis secondary to chronic hepatitis C with which she was treated with Harwoni, and GI bleed presented with three-day history of fevers, cough, shortness of breath. The patient stated that she had fevers up to 100.11F at the house. She has had some shortness of breath with a productive cough, but she does not look at her sputum to note if she has had any hemoptysis or the color of the sputum. She has had some nausea without any emesis. She notes increased loose stools from her ileostomy. She has not been on any recent antibiotics. She denies any abdominal pain. She complains of lower chest and upper abdominal pain with her coughing. She denies any dysuria, hematuria, blood in her ostomy, or unusual rashes. She has had headache without any visual disturbance or focal extremity weakness. In the emergency department, the patient was febrile up to 101.11F. She was hemodynamically stable. Oxygen saturation was 94-96 percent on room air. Potassium was 3.2 with sodium 132. Serum creatinine was 1.86. WBC was 7.8 with hemoglobin 10.5 and platelets 70,000. The patient was started on ceftriaxone and azithromycin and given 1 L bolus. Assessment/Plan: Community-acquired pneumonia -Suspect underlying "atypical" given patient's clinical history and unimpressive chest x-ray -Continue ceftriaxone and azithromycin -Albuterol when necessary shortness of breath wheezing -Urine legionella antigen, urine strep pneumoniae antigen -Respiratory viral panel -Blood cultures have been obtained in the emergency department Diarrhea -Patient has noted increased loose stool in her ileostomy -Stool for C. 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 Hypertension   . Enteritis presumed infectious   . Crohn's   . Crohn's   . Anemia   . Hepatitis C antibody test positive   . Hepatitis   . Renal insufficiency   . Cirrhosis   . Splenomegaly   . Bilateral leg edema 01/2014   Past Surgical History  Procedure Laterality Date  . Colon surgery      MULTIPLE SURGERIES FOR CROHNS  . Ileostomy      multiple abdominal surgeries for Crohn's by Dr. Gabriel Cirri  . Cholecystectomy    . Abscess drainage      abdominal  . Esophagogastroduodenoscopy  10/05/2010    Procedure: ESOPHAGOGASTRODUODENOSCOPY (EGD);  Surgeon: [name], MD;  [facility]: [facility];  Service: Endoscopy;  Laterality: N/A;  7:30 am  . Cirrhosis    . Esophagogastroduodenoscopy N/A 09/11/2013    Procedure: ESOPHAGOGASTRODUODENOSCOPY (EGD);  Surgeon: [name], MD;  [facility];  Service: Endoscopy;  Laterality: N/A;  . Ileoscopy  09/11/2013    Procedure: ILEOSCOPY THROUGH STOMA;  Surgeon: [name], MD;  [facility];  Service: Endoscopy;;  . Givens capsule study N/A 09/12/2013    Procedure: GIVENS CAPSULE STUDY;  Surgeon: [name], MD;  [facility];   Service: Endoscopy;  Laterality: N/A;  . Givens capsule study N/A 11/25/2013    Procedure: GIVENS CAPSULE STUDY;  Surgeon: [name], MD;  [facility]: [facility];  Service: Endoscopy;  Laterality: N/A;  . Varices cauterization Right     in the stoma of her ileostomy  . Tips procedure      [facility] in October   Social History:  reports that she has never smoked. She has never used smokeless tobacco. She reports that she does not drink alcohol or use illicit drugs.   Family History  Problem Relation Age of Onset  . Cancer Mother   . Stroke Father      Allergies  Allergen Reactions  . Penicillins Rash      Prior to Admission medications   Medication Sig Start Date End Date Taking? Authorizing Provider  acetaminophen (RA ACETAMINOPHEN) 650 MG CR tablet Take 1,300 mg by mouth 2 (two) times daily as needed.    Historical Provider, MD  ALPRAZolam Prudy Feeler) 1 MG tablet Take 1 tablet (1 mg total) by mouth at bedtime as needed for sleep. 02/13/14   Deatra Canter, FNP  Cholecalciferol (VITAMIN D-1000 MAX ST) 1000 UNITS tablet Take 1,000 Units by mouth daily.    Historical Provider, MD  furosemide (LASIX) 20 MG tablet Take 1 tablet (20 mg total) by mouth daily. 04/07/14   Len Blalock, NP  gabapentin (NEURONTIN) 100 MG capsule TAKE 1 CAPSULE BY MOUTH THREE TIMES DAILY 02/26/14   Deatra Canter, FNP  Multiple Vitamins-Minerals (MULTIVITAMIN WITH MINERALS) tablet Take 1 tablet by mouth daily.    Historical Provider, MD  nadolol (CORGARD) 40 MG tablet Take 40 mg by mouth daily. 12/07/13   Historical Provider, MD  nadolol (CORGARD) 40 MG tablet TAKE 1 TABLET BY MOUTH EVERY DAY 04/28/14   Ernestina Penna, MD  pantoprazole (PROTONIX) 40 MG tablet TAKE 1 TABLET BY MOUTH DAILY 02/03/14   Len Blalock, NP  spironolactone (ALDACTONE) 25 MG tablet Take 1 tablet (25 mg total) by mouth 2 (two) times daily. 04/07/14   Len Blalock, NP  traMADol (ULTRAM) 50 MG tablet TAKE 1 TABLET BY MOUTH EVERY SIX  HOURS AS NEEDED 05/26/14   Mary-Margaret Daphine Deutscher, FNP    Review of Systems:  Constitutional:  No weight loss, night sweats,  Head&Eyes: No headache.  No vision loss.  No eye pain or scotoma ENT:  No Difficulty swallowing,Tooth/dental problems,Sore throat,   Cardio-vascular:  No Orthopnea, PND, swelling in lower extremities,  dizziness, palpitations  GI:  No  abdominal pain, nausea, vomiting, diarrhea, loss of appetite, hematochezia, melena, heartburn, indigestion, Resp:  No coughing up of blood .No wheezing.No chest wall deformity  Skin:  no rash or lesions.  GU:  no dysuria, change in color of urine, no urgency or frequency. No flank pain.  Musculoskeletal:  No joint pain or swelling. No decreased range of motion. No back pain.  Psych:  No change in mood or affect. No depression or anxiety. Neurologic: No headache, no dysesthesia, no focal weakness, no vision loss. No syncope  Physical Exam: Filed Vitals:   06/09/14 0246 06/09/14 0518 06/09/14 0706  BP: 124/55  125/58  Pulse: 80  79  Temp: 101.2 F (38.4 C)  TempSrc: Oral    Resp: 20    Height: 5' 5.5" (1.664 m)    Weight: 108.863 kg (240 lb)    SpO2: 94% 96% 96%   General:  A&O x 3, NAD, nontoxic, pleasant/cooperative Head/Eye: No conjunctival hemorrhage, no icterus, [place]/AT, No nystagmus ENT:  No icterus,  No thrush, good dentition, no pharyngeal exudate Neck:  No masses, no lymphadenpathy, no bruits CV:  RRR, no rub, no gallop, no S3 Lung:  Bibasilar crackles, left greater than right. No wheeze Abdomen: soft/NT, +BS, nondistended, no peritoneal signs; right lower quadrant ileostomy with green watery stool Ext: No cyanosis, No rashes, No petechiae, No lymphangitis, 1+LE edema   Labs on Admission:  Basic Metabolic Panel:  Recent Labs Lab 06/09/14 0334  NA 132*  K 3.2*  CL 102  CO2 24  GLUCOSE 130*  BUN 29*  CREATININE 1.86*  CALCIUM 8.2*   Liver Function Tests: No results for input(s): AST, ALT,  ALKPHOS, BILITOT, PROT, ALBUMIN in the last 168 hours. No results for input(s): LIPASE, AMYLASE in the last 168 hours. No results for input(s): AMMONIA in the last 168 hours. CBC:  Recent Labs Lab 06/09/14 0334 06/09/14 0516  WBC 7.8  --   NEUTROABS  --  4.8  HGB 10.5*  --   HCT 31.4*  --   MCV 94.3  --   PLT 70*  --    Cardiac Enzymes: No results for input(s): CKTOTAL, CKMB, CKMBINDEX, TROPONINI in the last 168 hours. BNP: Invalid input(s): POCBNP CBG: No results for input(s): GLUCAP in the last 168 hours.  Radiological Exams on Admission: Dg Chest 2 View (if Patient Has Fever And/or Copd)  06/09/2014   CLINICAL DATA:  Cough, congestion, fever  EXAM: CHEST  2 VIEW  COMPARISON:  09/30/2013.  FINDINGS: There is patchy airspace opacity in the lingula, new. This may represent pneumonia. The right lung is clear. There is no pleural effusion. Pulmonary vasculature is normal. There is mildly prominent opacity and mildly prominent contour in the left hilum and this could represent reactive adenopathy.  IMPRESSION: Lingular infiltrate, likely pneumonia in this clinical setting. Followup PA and lateral chest X-ray is recommended in 3-4 weeks following trial of antibiotic therapy to ensure resolution and exclude underlying malignancy.   Electronically Signed   By: Ellery Plunk M.D.   On: 06/09/2014 04:18    EKG: Independently reviewed. pending    Time spent:60 minutes Code Status:   FULL Family Communication:   Family at bedside   Yoshiye Kraft, DO  [facility] Pager [phone]  If 7PM-7AM, please contact night-coverage [email or web address] Password [facility] 06/09/2014, 7:57 AM

## 2014-06-09 NOTE — Telephone Encounter (Signed)
rx has been filled

## 2014-06-09 NOTE — ED Notes (Signed)
Patient via [facility] c/o cough, congestion, fever nausea and diarrhea.

## 2014-06-09 NOTE — ED Notes (Signed)
Continue to await bed assignment.  Pt resting at this time and is aware of delay.

## 2014-06-10 ENCOUNTER — Inpatient Hospital Stay (HOSPITAL_COMMUNITY): Payer: Medicare HMO

## 2014-06-10 DIAGNOSIS — E876 Hypokalemia: Secondary | ICD-10-CM

## 2014-06-10 LAB — URINE CULTURE: Colony Count: >=100000

## 2014-06-10 LAB — COMPREHENSIVE METABOLIC PANEL
ALT: 13 U/L — ABNORMAL LOW (ref 14–54)
ANION GAP: 7 (ref 5–15)
AST: 22 U/L (ref 15–41)
Albumin: 2.5 g/dL — ABNORMAL LOW (ref 3.5–5.0)
Alkaline Phosphatase: 95 U/L (ref 38–126)
BILIRUBIN TOTAL: 1.6 mg/dL — AB (ref 0.3–1.2)
BUN: 27 mg/dL — AB (ref 6–20)
CO2: 22 mmol/L (ref 22–32)
Calcium: 7.9 mg/dL — ABNORMAL LOW (ref 8.9–10.3)
Chloride: 104 mmol/L (ref 101–111)
Creatinine, Ser: 1.38 mg/dL — ABNORMAL HIGH (ref 0.44–1.00)
GFR calc non Af Amer: 41 mL/min — ABNORMAL LOW (ref >60)
GFR, EST AFRICAN AMERICAN: 48 mL/min — AB (ref >60)
Glucose, Bld: 115 mg/dL — ABNORMAL HIGH (ref 70–99)
Potassium: 3.7 mmol/L (ref 3.5–5.1)
Sodium: 133 mmol/L — ABNORMAL LOW (ref 135–145)
Total Protein: 5.6 g/dL — ABNORMAL LOW (ref 6.5–8.1)

## 2014-06-10 LAB — FECAL LACTOFERRIN, QUANT: Fecal Lactoferrin: POSITIVE

## 2014-06-10 LAB — CBC
HEMATOCRIT: 29.5 % — AB (ref 36.0–46.0)
HEMOGLOBIN: 10 g/dL — AB (ref 12.0–15.0)
MCH: 31.8 pg (ref 26.0–34.0)
MCHC: 33.9 g/dL (ref 30.0–36.0)
MCV: 93.9 fL (ref 78.0–100.0)
PLATELETS: 76 10*3/uL — AB (ref 150–400)
RBC: 3.14 MIL/uL — ABNORMAL LOW (ref 3.87–5.11)
RDW: 16.1 % — ABNORMAL HIGH (ref 11.5–15.5)
WBC: 6.8 10*3/uL (ref 4.0–10.5)

## 2014-06-10 LAB — MAGNESIUM: Magnesium: 1.4 mg/dL — ABNORMAL LOW (ref 1.7–2.4)

## 2014-06-10 MED ORDER — IOHEXOL 300 MG/ML  SOLN
50.0000 mL | Freq: Once | INTRAMUSCULAR | Status: AC | PRN
  Administered 2014-06-10: 50 mL via ORAL

## 2014-06-10 MED ORDER — MAGNESIUM SULFATE 2 GM/50ML IV SOLN
2.0000 g | Freq: Once | INTRAVENOUS | Status: AC
  Administered 2014-06-10: 2 g via INTRAVENOUS
  Filled 2014-06-10: qty 50

## 2014-06-10 MED ORDER — AZITHROMYCIN 250 MG PO TABS
500.0000 mg | ORAL_TABLET | Freq: Every day | ORAL | Status: DC
  Administered 2014-06-11 – 2014-06-12 (×2): 500 mg via ORAL
  Filled 2014-06-10 (×2): qty 2

## 2014-06-10 NOTE — Progress Notes (Signed)
PROGRESS NOTE  Kathryn [name] ZOX:[id number] DOB: [birth year] DOA: 06/09/2014 PCP: [name], MD  Brief History 59 year old female with a history of hypertension, Crohn's ileitis status post ileostomy, liver cirrhosis secondary to chronic hepatitis C with which she was treated with Harwoni, and GI bleed presented with three-day history of fevers, cough, shortness of breath. The patient stated that she had fevers up to 100.41F at home. She has had some shortness of breath with a productive cough, but she does not look at her sputum to note if she has had any hemoptysis or the color of the sputum. After 24 hours of antibiotics, the patient continued to have low-grade fever up to 100.21F. Because of the abdominal pain, continued watery stool from her ostomy, and passing mucus from her rectum, CT of the abdomen and pelvis was ordered.  Assessment/Plan: Community-acquired pneumonia -Suspect underlying "atypical" given patient's clinical history and unimpressive chest x-ray -Continue ceftriaxone and azithromycin -Albuterol when necessary shortness of breath wheezing -Urine legionella antigen--pending -urine strep pneumoniae antigen--neg -Respiratory viral panel--pending -Blood cultures--neg to date Diarrhea -Patient has noted increased loose stool in her ileostomy -Stool for C. 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) -due to volume depletion -Baseline creatinine 1.2-1.5 -Hold Lasix and spironolactone today -IV  fluids-->back to baseline -Repeat BMP in am Chronic hepatitis C with liver cirrhosis -Hold nadolol due to soft BP -re-assess in am -Appears clinically compensated at this time Hyponatremia -Secondary to volume depletion -Continue IV fluids Normocytic anemia -Hemoglobin is stable Hypokalemia -Replete -Check magnesium--1.4-->replete Thrombocytopenia -Stable -Secondary to the patient's chronic liver disease -Monitor for signs of bleeding  Family Communication:   Pt at beside Disposition Plan:   Home when medically stable      Procedures/Studies: Dg Chest 2 View (if Patient Has Fever And/or Copd)  06/09/2014   CLINICAL DATA:  Cough, congestion, fever  EXAM: CHEST  2 VIEW  COMPARISON:  09/30/2013.  FINDINGS: There is patchy airspace opacity in the lingula, new. This may represent pneumonia. The right lung is clear. There is no pleural effusion. Pulmonary vasculature is normal. There is mildly prominent opacity and mildly prominent contour in the left hilum and this could represent reactive adenopathy.  IMPRESSION: Lingular infiltrate, likely pneumonia in this clinical setting. Followup PA and lateral chest X-ray is recommended in 3-4 weeks following trial of antibiotic therapy to ensure resolution and exclude underlying malignancy.   Electronically Signed   By: Ellery Plunk M.D.   On: 06/09/2014 04:18         Subjective: Patient states that she is breathing better but still having nonproductive cough. She is having vague left upper quadrant abdominal pain. She is still having watery stool from her ostomy. She is passing some mucus from her rectum. Denies any vomiting, chest pain, hemoptysis, headache.  Objective: Filed Vitals:   06/10/14 0510 06/10/14 0741 06/10/14 1430 06/10/14 1507  BP: 118/61   120/56  Pulse: 71   73  Temp: 98.8 F (37.1 C)   100.5 F (38.1 C)  TempSrc: Oral   Oral  Resp: 18  18  Height:      Weight:      SpO2: 10% 93% 94% 100%     Intake/Output Summary (Last 24 hours) at 06/10/14 1640 Last data filed at 06/10/14 1400  Gross per 24 hour  Intake 2082.5 ml  Output      0 ml  Net 2082.5 ml   Weight change: -1.644 kg (-3 lb 10 oz) Exam:   General:  Pt is alert, follows commands appropriately, not in acute distress  HEENT: No icterus, No thrush,[place]/AT  Cardiovascular: RRR, S1/S2, no rubs, no gallops  Respiratory: Left basilar crackles. Right clear to auscultation. No wheeze.  Abdomen: Soft/+BS, non tender, non distended, no guarding  Extremities: 1+LE edema, No lymphangitis, No petechiae, No rashes, no synovitis  Data Reviewed: Basic Metabolic Panel:  Recent Labs Lab 06/09/14 0334 06/10/14 0543  NA 132* 133*  K 3.2* 3.7  CL 102 104  CO2 24 22  GLUCOSE 130* 115*  BUN 29* 27*  CREATININE 1.86* 1.38*  CALCIUM 8.2* 7.9*  MG  --  1.4*   Liver Function Tests:  Recent Labs Lab 06/10/14 0543  AST 22  ALT 13*  ALKPHOS 95  BILITOT 1.6*  PROT 5.6*  ALBUMIN 2.5*   No results for input(s): LIPASE, AMYLASE in the last 168 hours. No results for input(s): AMMONIA in the last 168 hours. CBC:  Recent Labs Lab 06/09/14 0334 06/09/14 0516 06/10/14 0543  WBC 7.8  --  6.8  NEUTROABS  --  4.8  --   HGB 10.5*  --  10.0*  HCT 31.4*  --  29.5*  MCV 94.3  --  93.9  PLT 70*  --  76*   Cardiac Enzymes: No results for input(s): CKTOTAL, CKMB, CKMBINDEX, TROPONINI in the last 168 hours. BNP: Invalid input(s): POCBNP CBG: No results for input(s): GLUCAP in the last 168 hours.  Recent Results (from the past 240 hour(s))  Culture, blood (routine x 2)     Status: None (Preliminary result)   Collection Time: 06/09/14  5:21 AM  Result Value Ref Range Status   Specimen Description BLOOD RIGHT ARM  Final   Special Requests BOTTLES DRAWN AEROBIC AND ANAEROBIC 8CC  Final   Culture NO GROWTH 1 DAY  Final   Report Status PENDING  Incomplete  Culture, blood (routine x 2)     Status: None (Preliminary  result)   Collection Time: 06/09/14  5:23 AM  Result Value Ref Range Status   Specimen Description BLOOD LEFT ARM  Final   Special Requests BOTTLES DRAWN AEROBIC ONLY 8CC  Final   Culture NO GROWTH 1 DAY  Final   Report Status PENDING  Incomplete  Culture, Urine     Status: None   Collection Time: 06/09/14  5:45 AM  Result Value Ref Range Status   Specimen Description URINE, CLEAN CATCH  Final   Special Requests NONE  Final   Colony Count   Final    >=100,000 COLONIES/ML Performed at [facility]    Culture   Final    Multiple bacterial morphotypes present, none predominant. Suggest appropriate recollection if clinically indicated. Performed at [facility]    Report Status 06/10/2014 FINAL  Final  Clostridium Difficile by PCR     Status: None   Collection Time: 06/09/14  2:30 PM  Result Value Ref Range Status   C difficile by pcr NEGATIVE NEGATIVE Final  MRSA PCR Screening     Status: None   Collection Time: 06/09/14  3:52 PM  Result Value Ref Range Status   MRSA by PCR NEGATIVE NEGATIVE Final    Comment:        The GeneXpert MRSA Assay (FDA approved for NASAL specimens only), is one component of a comprehensive MRSA colonization surveillance program. It is not intended to diagnose MRSA infection nor to guide or monitor treatment for MRSA infections.      Scheduled Meds: . [START ON 06/11/2014] azithromycin  500 mg Oral Daily  . cefTRIAXone (ROCEPHIN)  IV  1 g Intravenous Q24H  . gabapentin  100 mg Oral TID  . ipratropium-albuterol  3 mL Nebulization TID  . nadolol  40 mg Oral Daily  . pantoprazole  40 mg Oral Daily   Continuous Infusions: . sodium chloride    . 0.9 % NaCl with KCl 20 mEq / L 75 mL/hr at 06/10/14 1445     Jamee Pacholski, DO  [facility] Pager [phone]  If 7PM-7AM, please contact night-coverage [email or web address] Password [email or web address] 06/10/2014, 4:40 PM   LOS: 1 day

## 2014-06-10 NOTE — Care Management Note (Signed)
Case Management Note  Patient Details  Name: BERNADETTE GORES MRN: [id number] Date of Birth: 02/03/1956  Subjective/Objective:                  Pt is from home, lives with husband and independent at baseline. Pt has no HH services or DME's prior to admission. Pt plans to discharge home with self care. Pt requiring nebs during hospitalization. ? If nebs will be needed at DC. Pt does not have neb machine at home. Will cont to follow.   Action/Plan:   Expected Discharge Date:  06/13/14               Expected Discharge Plan:  Home/Self Care  In-House Referral:  NA  Discharge planning Services  CM Consult  Post Acute Care Choice:  NA Choice offered to:  NA  DME Arranged:    DME Agency:     HH Arranged:    HH Agency:     Status of Service:  In process, will continue to follow  Medicare Important Message Given:    Date Medicare IM Given:    Medicare IM give by:    Date Additional Medicare IM Given:    Additional Medicare Important Message give by:     If discussed at Long Length of Stay Meetings, dates discussed:    Additional Comments:  Malcolm Metro, RN 06/10/2014, 3:05 PM

## 2014-06-10 NOTE — Progress Notes (Signed)
PHARMACIST - PHYSICIAN COMMUNICATION DR:   Tat  CONCERNING: Antibiotic IV to Oral Route Change Policy  RECOMMENDATION: This patient is receiving Zithromax by the intravenous route.  Based on criteria approved by the Pharmacy and Therapeutics Committee, the antibiotic(s) is/are being converted to the equivalent oral dose form(s).   DESCRIPTION: These criteria include:  Patient being treated for a respiratory tract infection, urinary tract infection, cellulitis or clostridium difficile associated diarrhea if on metronidazole  The patient is not neutropenic and does not exhibit a GI malabsorption state  The patient is eating (either orally or via tube) and/or has been taking other orally administered medications for a least 24 hours  The patient is improving clinically and has a Tmax < 100.5  If you have questions about this conversion, please contact the Pharmacy Department  [x]   [phone] )  [facility] []   [phone] )  [facility] []   [phone] )  [facility] []   [phone] )  [facility]    S. Margo Aye, PharmD

## 2014-06-11 LAB — BASIC METABOLIC PANEL
ANION GAP: 5 (ref 5–15)
BUN: 22 mg/dL — ABNORMAL HIGH (ref 6–20)
CALCIUM: 7.9 mg/dL — AB (ref 8.9–10.3)
CO2: 22 mmol/L (ref 22–32)
Chloride: 104 mmol/L (ref 101–111)
Creatinine, Ser: 1.3 mg/dL — ABNORMAL HIGH (ref 0.44–1.00)
GFR calc non Af Amer: 44 mL/min — ABNORMAL LOW (ref >60)
GFR, EST AFRICAN AMERICAN: 51 mL/min — AB (ref >60)
GLUCOSE: 112 mg/dL — AB (ref 70–99)
POTASSIUM: 3.5 mmol/L (ref 3.5–5.1)
SODIUM: 131 mmol/L — AB (ref 135–145)

## 2014-06-11 LAB — CBC
HCT: 28.2 % — ABNORMAL LOW (ref 36.0–46.0)
HEMOGLOBIN: 9.6 g/dL — AB (ref 12.0–15.0)
MCH: 32 pg (ref 26.0–34.0)
MCHC: 34 g/dL (ref 30.0–36.0)
MCV: 94 fL (ref 78.0–100.0)
Platelets: 82 10*3/uL — ABNORMAL LOW (ref 150–400)
RBC: 3 MIL/uL — ABNORMAL LOW (ref 3.87–5.11)
RDW: 15.8 % — ABNORMAL HIGH (ref 11.5–15.5)
WBC: 6.2 10*3/uL (ref 4.0–10.5)

## 2014-06-11 LAB — LEGIONELLA ANTIGEN, URINE

## 2014-06-11 LAB — RESPIRATORY VIRUS PANEL
Adenovirus: NEGATIVE
INFLUENZA B 1: NEGATIVE
Influenza A: NEGATIVE
Metapneumovirus: NEGATIVE
PARAINFLUENZA 1 A: NEGATIVE
Parainfluenza 2: NEGATIVE
Parainfluenza 3: NEGATIVE
RESPIRATORY SYNCYTIAL VIRUS A: NEGATIVE
RESPIRATORY SYNCYTIAL VIRUS B: NEGATIVE
RHINOVIRUS: NEGATIVE

## 2014-06-11 LAB — MAGNESIUM: MAGNESIUM: 1.9 mg/dL (ref 1.7–2.4)

## 2014-06-11 MED ORDER — FUROSEMIDE 20 MG PO TABS
20.0000 mg | ORAL_TABLET | Freq: Every day | ORAL | Status: DC
  Administered 2014-06-11 – 2014-06-12 (×2): 20 mg via ORAL
  Filled 2014-06-11 (×2): qty 1

## 2014-06-11 MED ORDER — SODIUM CHLORIDE 0.9 % IV SOLN
1000.0000 mL | INTRAVENOUS | Status: DC
  Administered 2014-06-11: 1000 mL via INTRAVENOUS

## 2014-06-11 NOTE — Progress Notes (Signed)
[facility] PROGRESS NOTE  Kathryn [name]:[id number] DOB: September 11, 1955 DOA: 06/09/2014 PCP: [name], MD  Assessment/Plan: Community-acquired pneumonia -Concern for  "atypical" given patient's clinical history and unimpressive chest x-ray. Max temp 100.0. No leukocytosis. Continue ceftriaxone and azithromycin day #3. Continue Albuterol when necessary shortness of breath wheezing. Urine legionella antigen and urine strep pneumoniae antigen negative. Respiratory viral panel negative. Blood cultures no growth. Will plan to narrow antibiotics tomorrow if afebrile. Will mobilize patient.  Diarrhea -Patient has noted increased loose stool in her ileostomy. Stool for C. difficile PCR neg. Stool lactoferrin--positive--may be due to her Crohn's disease. CT abdomen pelvis without obstruction.  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 Antibiotics as above. 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 Creatinine in baseline range. Will resume  lasix and continue to hold spironolactone. Decrease IV rate. Baseline Repeat BMP in am Chronic hepatitis C with liver cirrhosis -continue to hold nadolol due to soft BP. Appears clinically compensated at this time Hyponatremia - trending down. volume depleted on admission. Will decrease IV rate and resume home low dose lasix. Recheck in am Normocytic anemia -Hemoglobin remains stable Hypokalemia -Repleted and resolved. Magnesium--1.4-->replete Thrombocytopenia - remains stable.-Secondary to the patient's chronic liver disease.Monitor for signs of bleeding   Code Status: full Family Communication: significant other Disposition Plan: home hopefully 24-48 hours   Consultants:  none  Procedures:  none  Antibiotics:  Rocephin 06/09/14>>  Azithromycin  06/09/14>>  HPI/Subjective: Sitting up in bed visiting with family denies pain/discomfort.   Objective: Filed Vitals:   06/11/14 1356  BP: 111/41  Pulse: 66  Temp: 99 F (37.2 C)  Resp: 18    Intake/Output Summary (Last 24 hours) at 06/11/14 1506 Last data filed at 06/11/14 1357  Gross per 24 hour  Intake 1873.75 ml  Output   1002 ml  Net 871.75 ml   Filed Weights   06/09/14 0246 06/09/14 1253  Weight: 108.863 kg (240 lb) 107.219 kg (236 lb 6 oz)    Exam:   General:  Obese appears comfortable  Cardiovascular: RRR no MGR trace LE edema  Respiratory: normal effort. BS with crackles on leff  Abdomen: obese soft. Ostomy intack   Musculoskeletal: no clubbing or cyanosis   Data Reviewed: Basic Metabolic Panel:  Recent Labs Lab 06/09/14 0334 06/10/14 0543 06/11/14 0544  NA 132* 133* 131*  K 3.2* 3.7 3.5  CL 102 104 104  CO2 GLUCOSE 130* 115* 112*  BUN 29* 27* 22*  CREATININE 1.86* 1.38* 1.30*  CALCIUM 8.2* 7.9* 7.9*  MG  --  1.4* 1.9   Liver Function Tests:  Recent Labs Lab 06/10/14 0543  AST 22  ALT 13*  ALKPHOS 95  BILITOT 1.6*  PROT 5.6*  ALBUMIN 2.5*   No results for input(s): LIPASE, AMYLASE in the last 168 hours. No results for input(s): AMMONIA in the last 168 hours. CBC:  Recent Labs Lab 06/09/14 0334 06/09/14 0516 06/10/14 0543 06/11/14 0544  WBC 7.8  --  6.8 6.2  NEUTROABS  --  4.8  --   --   HGB 10.5*  --  10.0* 9.6*  HCT 31.4*  --  29.5* 28.2*  MCV 94.3  --  93.9 94.0  PLT 70*  --  76* 82*   Cardiac Enzymes: No results for input(s): CKTOTAL, CKMB, CKMBINDEX, TROPONINI in the last 168  hours. BNP (last 3 results) No results for input(s): BNP in the last 8760 hours.  ProBNP (last 3 results) No results for input(s): PROBNP in the last 8760 hours.  CBG: No results for input(s): GLUCAP in the last 168 hours.  Recent Results (from the past 240 hour(s))  Culture, blood (routine x 2)     Status: None (Preliminary  result)   Collection Time: 06/09/14  5:21 AM  Result Value Ref Range Status   Specimen Description BLOOD RIGHT ARM  Final   Special Requests BOTTLES DRAWN AEROBIC AND ANAEROBIC 8CC  Final   Culture NO GROWTH 2 DAYS  Final   Report Status PENDING  Incomplete  Culture, blood (routine x 2)     Status: None (Preliminary result)   Collection Time: 06/09/14  5:23 AM  Result Value Ref Range Status   Specimen Description BLOOD LEFT ARM  Final   Special Requests BOTTLES DRAWN AEROBIC ONLY 8CC  Final   Culture NO GROWTH 2 DAYS  Final   Report Status PENDING  Incomplete  Culture, Urine     Status: None   Collection Time: 06/09/14  5:45 AM  Result Value Ref Range Status   Specimen Description URINE, CLEAN CATCH  Final   Special Requests NONE  Final   Colony Count   Final    >=100,000 COLONIES/ML Performed at [facility]    Culture   Final    Multiple bacterial morphotypes present, none predominant. Suggest appropriate recollection if clinically indicated. Performed at [facility]    Report Status 06/10/2014 FINAL  Final  Clostridium Difficile by PCR     Status: None   Collection Time: 06/09/14  2:30 PM  Result Value Ref Range Status   C difficile by pcr NEGATIVE NEGATIVE Final  Respiratory virus panel     Status: None   Collection Time: 06/09/14  2:30 PM  Result Value Ref Range Status   Respiratory Syncytial Virus A Negative Negative Final   Respiratory Syncytial Virus B Negative Negative Final   Influenza A Negative Negative Final   Influenza B Negative Negative Final   Parainfluenza 1 Negative Negative Final   Parainfluenza 2 Negative Negative Final   Parainfluenza 3 Negative Negative Final   Metapneumovirus Negative Negative Final   Rhinovirus Negative Negative Final   Adenovirus Negative Negative Final    Comment: (NOTE) Performed At: [facility] [address] [place], [place] [id number] Mila Homer MD WU:[id number]   MRSA PCR Screening      Status: None   Collection Time: 06/09/14  3:52 PM  Result Value Ref Range Status   MRSA by PCR NEGATIVE NEGATIVE Final    Comment:        The GeneXpert MRSA Assay (FDA approved for NASAL specimens only), is one component of a comprehensive MRSA colonization surveillance program. It is not intended to diagnose MRSA infection nor to guide or monitor treatment for MRSA infections.      Studies: Ct Abdomen Pelvis Wo Contrast  06/10/2014   CLINICAL DATA:  Abdominal pain, fever, patient Crohn's disease ileostomy.  EXAM: CT ABDOMEN AND PELVIS WITHOUT CONTRAST  TECHNIQUE: Multidetector CT imaging of the abdomen and pelvis was performed following the standard protocol without IV contrast.  COMPARISON:  CT 06/09/2013 MRI abdomen 09/12/2013  FINDINGS: Lower chest: There is a focus of consolidation in the left lower lobe measuring 6 cm most consistent with bronchopneumonia.  Hepatobiliary: No focal hepatic lesions noncontrast exam. TIPS shunt noted. No ascites. Liver  is mildly nodular.  Pancreas: Pancreas is normal. No ductal dilatation. No pancreatic inflammation.  Spleen: Spleen is enlarged.  Adrenals/urinary tract: Adrenal glands and kidneys are normal. The ureters and bladder normal.  Stomach/Bowel: Stomach, duodenum, and small bowel normal. There is a left lower quadrant ileostomy. Contrast flows through the entirety of the small bowel into the ostomy bag. Patient status post colectomy. The remnant sigmoid and rectum are normal.  Vascular/Lymphatic: Abdominal aorta is normal caliber. There is no retroperitoneal or periportal lymphadenopathy. No pelvic lymphadenopathy.  Reproductive: Uterus and ovaries are normal. Tubal ligation clips noted  Musculoskeletal: No aggressive osseous lesion.  Other: No free fluid.  IMPRESSION: 1. Left lower lobe pneumonia. 2. No evidence of bowel obstruction. Right lower quadrant ileostomy without complication. 3. TIPS shunt in place without ascites.  Splenomegaly noted.    Electronically Signed   By: Genevive Bi M.D.   On: 06/10/2014 21:23    Scheduled Meds: . azithromycin  500 mg Oral Daily  . cefTRIAXone (ROCEPHIN)  IV  1 g Intravenous Q24H  . gabapentin  100 mg Oral TID  . ipratropium-albuterol  3 mL Nebulization TID  . nadolol  40 mg Oral Daily  . pantoprazole  40 mg Oral Daily   Continuous Infusions: . sodium chloride      Active Problems:   Thrombocytopenia   Anemia of chronic disease   Community acquired pneumonia   CAP (community acquired pneumonia)   Liver cirrhosis   Acute on chronic renal failure   Cirrhosis   Hypokalemia    Time spent: 35 minutes    [facility] M  [facility] Pager [phone]. If 7PM-7AM, please contact night-coverage at [email or web address], password [facility] 06/11/2014, 3:06 PM  LOS: 2 days

## 2014-06-12 LAB — BASIC METABOLIC PANEL
Anion gap: 3 — ABNORMAL LOW (ref 5–15)
BUN: 19 mg/dL (ref 6–20)
CO2: 21 mmol/L — ABNORMAL LOW (ref 22–32)
Calcium: 7.9 mg/dL — ABNORMAL LOW (ref 8.9–10.3)
Chloride: 109 mmol/L (ref 101–111)
Creatinine, Ser: 1.33 mg/dL — ABNORMAL HIGH (ref 0.44–1.00)
GFR calc Af Amer: 50 mL/min — ABNORMAL LOW (ref >60)
GFR calc non Af Amer: 43 mL/min — ABNORMAL LOW (ref >60)
GLUCOSE: 130 mg/dL — AB (ref 70–99)
POTASSIUM: 3.3 mmol/L — AB (ref 3.5–5.1)
SODIUM: 133 mmol/L — AB (ref 135–145)

## 2014-06-12 LAB — CBC
HCT: 26.9 % — ABNORMAL LOW (ref 36.0–46.0)
HEMOGLOBIN: 9.3 g/dL — AB (ref 12.0–15.0)
MCH: 32.4 pg (ref 26.0–34.0)
MCHC: 34.6 g/dL (ref 30.0–36.0)
MCV: 93.7 fL (ref 78.0–100.0)
Platelets: 81 10*3/uL — ABNORMAL LOW (ref 150–400)
RBC: 2.87 MIL/uL — ABNORMAL LOW (ref 3.87–5.11)
RDW: 15.8 % — ABNORMAL HIGH (ref 11.5–15.5)
WBC: 3.9 10*3/uL — ABNORMAL LOW (ref 4.0–10.5)

## 2014-06-12 MED ORDER — POTASSIUM CHLORIDE CRYS ER 20 MEQ PO TBCR
40.0000 meq | EXTENDED_RELEASE_TABLET | Freq: Once | ORAL | Status: AC
  Administered 2014-06-12: 40 meq via ORAL
  Filled 2014-06-12: qty 2

## 2014-06-12 MED ORDER — SPIRONOLACTONE 25 MG PO TABS: 25.0000 mg | ORAL_TABLET | Freq: Every day | ORAL | Status: DC

## 2014-06-12 MED ORDER — SODIUM CHLORIDE 0.9 % IV SOLN: 1000.0000 mL | INTRAVENOUS | Status: DC

## 2014-06-12 MED ORDER — LEVOFLOXACIN 750 MG PO TABS: 750.0000 mg | ORAL_TABLET | Freq: Every day | ORAL | Status: DC

## 2014-06-12 MED ORDER — TRAMADOL HCL 50 MG PO TABS: 50.0000 mg | ORAL_TABLET | Freq: Four times a day (QID) | ORAL | Status: DC | PRN

## 2014-06-12 NOTE — Discharge Summary (Signed)
Physician Discharge Summary  Kathryn [name] ZOX:[id number] DOB: 1955-09-30 DOA: 06/09/2014  PCP: [name], MD  Admit date: 06/09/2014 Discharge date: 06/12/2014  Time spent: 40 minutes  Recommendations for Outpatient Follow-up:  1. PCP dr [name] for evaluation of symptoms and resolution of CAP   Discharge Diagnoses:  Principal Problem:   Community acquired pneumonia Active Problems:   Thrombocytopenia   Anemia of chronic disease   CAP (community acquired pneumonia)   Liver cirrhosis   Acute on chronic renal failure   Cirrhosis   Hypokalemia   Discharge Condition: stable  Diet recommendation: gm 2 low sodium diet  Filed Weights   06/09/14 0246 06/09/14 1253  Weight: 108.863 kg (240 lb) 107.219 kg (236 lb 6 oz)    History of present illness:  59 year old female with a history of hypertension, Crohn's ileitis status post ileostomy, liver cirrhosis secondary to chronic hepatitis C with which she was treated with Harwoni, and GI bleed presented on 06/09/14 with three-day history of fevers, cough, shortness of breath. The patient stated that she had fevers up to 100.70F at the house. She  had some shortness of breath with a productive cough, but she did not look at her sputum to note if she has had any hemoptysis or the color of the sputum. She  had some nausea without any emesis. She noted increased loose stools from her ileostomy. She had not been on any recent antibiotics. She denied any abdominal pain. She complained of lower chest and upper abdominal pain with her coughing. She denied any dysuria, hematuria, blood in her ostomy, or unusual rashes. She had headache without any visual disturbance or focal extremity weakness. In the emergency department, the patient was febrile up to 101.70F. She was hemodynamically stable. Oxygen saturation was 94-96 percent on room air. Potassium was 3.2 with sodium 132. Serum creatinine was 1.86. WBC was 7.8 with hemoglobin 10.5 and platelets  70,000. The patient was started on ceftriaxone and azithromycin and given 1 L bolus.  Hospital Course:  Community-acquired pneumonia -Concern for "atypical" given patient's clinical history and unimpressive chest x-ray. Admitted provided with IV antibiotics for 3 days. Will be discharged po antibiotics to complete 7 day course. At discharge afebrile and non-toxic appearing.  Urine legionella antigen and urine strep pneumoniae antigen negative. Respiratory viral panel negative. Blood cultures no growth. Oxygen saturation level>90% on room air. Follow up with PCP 1-2 weeks  Diarrhea -Patient has noted increased loose stool in her ileostomy. Stool for C. difficile PCR neg. Stool lactoferrin--positive--may be due to her Crohn's disease. CT abdomen pelvis without obstruction.  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 Antibiotics as above. 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 Creatinine in baseline range at discharge.  Chronic hepatitis C with liver cirrhosis - Appears clinically compensated at this time Hyponatremia - trending up at discharge. Recommend BMET in 1-2 weeks for trending. Normocytic anemia -Hemoglobin remains stable Hypokalemia -Repleted and resolved. Magnesium--1.4-->repleted Thrombocytopenia - remained stable.-Secondary to the patient's chronic liver disease.Monitor for signs of bleeding    Procedures:  none  Consultations:  none  Discharge Exam: Filed Vitals:   06/12/14 0631  BP: 128/53  Pulse: 64  Temp: 98.1 F (36.7 C)  Resp: 18    General: obese appears comfortable Cardiovascular: RRR no MGR trace LE edema Respiratory: normal effort BS diminished somewhat. No crackles mild end expiratory  wheeze  Discharge Instructions    Current Discharge Medication List    START taking these  medications   Details  levofloxacin (LEVAQUIN) 750 MG tablet Take 1 tablet (750 mg total) by mouth daily. Qty: 5 tablet, Refills: 0      CONTINUE these medications which have CHANGED   Details  spironolactone (ALDACTONE) 25 MG tablet Take 1 tablet (25 mg total) by mouth daily. Qty: 60 tablet, Refills: 3   Associated Diagnoses: Leg ulcer, right, limited to breakdown of skin    traMADol (ULTRAM) 50 MG tablet Take 1 tablet (50 mg total) by mouth every 6 (six) hours as needed for moderate pain. TAKE 1 TABLET BY MOUTH EVERY SIX HOURS AS NEEDED Qty: 60 tablet, Refills: 0      CONTINUE these medications which have NOT CHANGED   Details  acetaminophen (RA ACETAMINOPHEN) 650 MG CR tablet Take 1,300 mg by mouth 2 (two) times daily as needed.    ALPRAZolam (XANAX) 1 MG tablet Take 1 tablet (1 mg total) by mouth at bedtime as needed for sleep. Qty: 30 tablet, Refills: 3   Associated Diagnoses: Anxiety state    Cholecalciferol (VITAMIN D-1000 MAX ST) 1000 UNITS tablet Take 1,000 Units by mouth daily.    furosemide (LASIX) 20 MG tablet Take 1 tablet (20 mg total) by mouth daily. Qty: 30 tablet, Refills: 3    gabapentin (NEURONTIN) 100 MG capsule TAKE 1 CAPSULE BY MOUTH THREE TIMES DAILY Qty: 270 capsule, Refills: 0    Multiple Vitamins-Minerals (MULTIVITAMIN WITH MINERALS) tablet Take 1 tablet by mouth daily.    nadolol (CORGARD) 40 MG tablet TAKE 1 TABLET BY MOUTH EVERY DAY Qty: 30 tablet, Refills: 3    Probiotic Product (PROBIOTIC PO) Take 1 tablet by mouth daily.    pantoprazole (PROTONIX) 40 MG tablet TAKE 1 TABLET BY MOUTH DAILY Qty: 30 tablet, Refills: 3       Allergies  Allergen Reactions  . Penicillins Rash   Follow-up Information    Follow up with [name], MD.   Specialty:  [facility]   Contact information:   [address] [place] [address] [phone]        The results of significant diagnostics from this hospitalization (including imaging,  microbiology, ancillary and laboratory) are listed below for reference.    Significant Diagnostic Studies: Ct Abdomen Pelvis Wo Contrast  06/10/2014   CLINICAL DATA:  Abdominal pain, fever, patient Crohn's disease ileostomy.  EXAM: CT ABDOMEN AND PELVIS WITHOUT CONTRAST  TECHNIQUE: Multidetector CT imaging of the abdomen and pelvis was performed following the standard protocol without IV contrast.  COMPARISON:  CT 06/09/2013 MRI abdomen 09/12/2013  FINDINGS: Lower chest: There is a focus of consolidation in the left lower lobe measuring 6 cm most consistent with bronchopneumonia.  Hepatobiliary: No focal hepatic lesions noncontrast exam. TIPS shunt noted. No ascites. Liver is mildly nodular.  Pancreas: Pancreas is normal. No ductal dilatation. No pancreatic inflammation.  Spleen: Spleen is enlarged.  Adrenals/urinary tract: Adrenal glands and kidneys are normal. The ureters and bladder normal.  Stomach/Bowel: Stomach, duodenum, and small bowel normal. There is a left lower quadrant ileostomy. Contrast flows through the entirety of the small bowel into the ostomy bag. Patient status post colectomy. The remnant sigmoid and rectum are normal.  Vascular/Lymphatic: Abdominal aorta is normal caliber. There is no retroperitoneal or periportal lymphadenopathy. No pelvic lymphadenopathy.  Reproductive: Uterus and ovaries are normal. Tubal ligation clips noted  Musculoskeletal: No aggressive osseous lesion.  Other:  No free fluid.  IMPRESSION: 1. Left lower lobe pneumonia. 2. No evidence of bowel obstruction. Right lower quadrant ileostomy without complication. 3. TIPS shunt in place without ascites.  Splenomegaly noted.   Electronically Signed   By: Genevive Bi M.D.   On: 06/10/2014 21:23   Dg Chest 2 View (if Patient Has Fever And/or Copd)  06/09/2014   CLINICAL DATA:  Cough, congestion, fever  EXAM: CHEST  2 VIEW  COMPARISON:  09/30/2013.  FINDINGS: There is patchy airspace opacity in the lingula, new. This may  represent pneumonia. The right lung is clear. There is no pleural effusion. Pulmonary vasculature is normal. There is mildly prominent opacity and mildly prominent contour in the left hilum and this could represent reactive adenopathy.  IMPRESSION: Lingular infiltrate, likely pneumonia in this clinical setting. Followup PA and lateral chest X-ray is recommended in 3-4 weeks following trial of antibiotic therapy to ensure resolution and exclude underlying malignancy.   Electronically Signed   By: Ellery Plunk M.D.   On: 06/09/2014 04:18    Microbiology: Recent Results (from the past 240 hour(s))  Culture, blood (routine x 2)     Status: None (Preliminary result)   Collection Time: 06/09/14  5:21 AM  Result Value Ref Range Status   Specimen Description BLOOD RIGHT ARM  Final   Special Requests BOTTLES DRAWN AEROBIC AND ANAEROBIC 8CC  Final   Culture NO GROWTH 2 DAYS  Final   Report Status PENDING  Incomplete  Culture, blood (routine x 2)     Status: None (Preliminary result)   Collection Time: 06/09/14  5:23 AM  Result Value Ref Range Status   Specimen Description BLOOD LEFT ARM  Final   Special Requests BOTTLES DRAWN AEROBIC ONLY 8CC  Final   Culture NO GROWTH 2 DAYS  Final   Report Status PENDING  Incomplete  Culture, Urine     Status: None   Collection Time: 06/09/14  5:45 AM  Result Value Ref Range Status   Specimen Description URINE, CLEAN CATCH  Final   Special Requests NONE  Final   Colony Count   Final    >=100,000 COLONIES/ML Performed at [facility]    Culture   Final    Multiple bacterial morphotypes present, none predominant. Suggest appropriate recollection if clinically indicated. Performed at [facility]    Report Status 06/10/2014 FINAL  Final  Clostridium Difficile by PCR     Status: None   Collection Time: 06/09/14  2:30 PM  Result Value Ref Range Status   C difficile by pcr NEGATIVE NEGATIVE Final  Respiratory virus panel     Status: None    Collection Time: 06/09/14  2:30 PM  Result Value Ref Range Status   Respiratory Syncytial Virus A Negative Negative Final   Respiratory Syncytial Virus B Negative Negative Final   Influenza A Negative Negative Final   Influenza B Negative Negative Final   Parainfluenza 1 Negative Negative Final   Parainfluenza 2 Negative Negative Final   Parainfluenza 3 Negative Negative Final   Metapneumovirus Negative Negative Final   Rhinovirus Negative Negative Final   Adenovirus Negative Negative Final    Comment: (NOTE) Performed At: [facility] [address] [place], [place] [id number] Mila Homer MD WU:[id number]   MRSA PCR Screening     Status: None   Collection Time: 06/09/14  3:52 PM  Result Value Ref Range Status   MRSA by PCR NEGATIVE NEGATIVE Final    Comment:  The GeneXpert MRSA Assay (FDA approved for NASAL specimens only), is one component of a comprehensive MRSA colonization surveillance program. It is not intended to diagnose MRSA infection nor to guide or monitor treatment for MRSA infections.      Labs: Basic Metabolic Panel:  Recent Labs Lab 06/09/14 0334 06/10/14 0543 06/11/14 0544 06/12/14 0549  NA 132* 133* 131* 133*  K 3.2* 3.7 3.5 3.3*  CL 102 104 104 109  CO2 24 22 22  21*  GLUCOSE 130* 115* 112* 130*  BUN 29* 27* 22* 19  CREATININE 1.86* 1.38* 1.30* 1.33*  CALCIUM 8.2* 7.9* 7.9* 7.9*  MG  --  1.4* 1.9  --    Liver Function Tests:  Recent Labs Lab 06/10/14 0543  AST 22  ALT 13*  ALKPHOS 95  BILITOT 1.6*  PROT 5.6*  ALBUMIN 2.5*   No results for input(s): LIPASE, AMYLASE in the last 168 hours. No results for input(s): AMMONIA in the last 168 hours. CBC:  Recent Labs Lab 06/09/14 0334 06/09/14 0516 06/10/14 0543 06/11/14 0544 06/12/14 0549  WBC 7.8  --  6.8 6.2 3.9*  NEUTROABS  --  4.8  --   --   --   HGB 10.5*  --  10.0* 9.6* 9.3*  HCT 31.4*  --  29.5* 28.2* 26.9*  MCV 94.3  --  93.9 94.0 93.7  PLT 70*   --  76* 82* 81*   Cardiac Enzymes: No results for input(s): CKTOTAL, CKMB, CKMBINDEX, TROPONINI in the last 168 hours. BNP: BNP (last 3 results) No results for input(s): BNP in the last 8760 hours.  ProBNP (last 3 results) No results for input(s): PROBNP in the last 8760 hours.  CBG: No results for input(s): GLUCAP in the last 168 hours.     SignedGwenyth Bender  [facility] 06/12/2014, 11:16 AM

## 2014-06-12 NOTE — Care Management Note (Signed)
Case Management Note  Patient Details  Name: MACIEL LANGLOIS MRN: [id number] Date of Birth: 08-23-1955   Expected Discharge Date:  06/13/14               Expected Discharge Plan:  Home/Self Care  In-House Referral:  NA  Discharge planning Services  CM Consult  Post Acute Care Choice:  NA Choice offered to:  NA  DME Arranged:    DME Agency:     HH Arranged:    HH Agency:     Status of Service:  Completed, signed off  Medicare Important Message Given:  Yes Date Medicare IM Given:  06/12/14 Medicare IM give by:  Kathyrn Sheriff, RN, MSN, CM  Date Additional Medicare IM Given:    Additional Medicare Important Message give by:     If discussed at Long Length of Stay Meetings, dates discussed:    Additional Comments: Pt being discharged home today with self care. No CM needs.   Malcolm Metro, RN 06/12/2014, 1:21 PM

## 2014-06-14 LAB — CULTURE, BLOOD (ROUTINE X 2)
Culture: NO GROWTH
Culture: NO GROWTH

## 2014-06-17 ENCOUNTER — Other Ambulatory Visit: Payer: Self-pay

## 2014-06-17 ENCOUNTER — Telehealth: Payer: Self-pay | Admitting: Nurse Practitioner

## 2014-06-17 DIAGNOSIS — F411 Generalized anxiety disorder: Secondary | ICD-10-CM

## 2014-06-17 MED ORDER — ALPRAZOLAM 1 MG PO TABS: 1.0000 mg | ORAL_TABLET | Freq: Every evening | ORAL | Status: DC | PRN

## 2014-06-17 NOTE — Telephone Encounter (Signed)
rx called into pharmacy

## 2014-06-17 NOTE — Telephone Encounter (Signed)
Last seen 02/13/14 B [place]  If approved route to nurse to call into [facility]  [phone]

## 2014-06-17 NOTE — Telephone Encounter (Signed)
Please call in xanax with 1 refills 

## 2014-06-18 NOTE — Telephone Encounter (Signed)
Appt given per patient request 

## 2014-06-25 ENCOUNTER — Ambulatory Visit: Payer: Medicare HMO | Admitting: Family

## 2014-06-30 ENCOUNTER — Ambulatory Visit (INDEPENDENT_AMBULATORY_CARE_PROVIDER_SITE_OTHER): Payer: Medicare HMO | Admitting: Internal Medicine

## 2014-06-30 ENCOUNTER — Encounter (INDEPENDENT_AMBULATORY_CARE_PROVIDER_SITE_OTHER): Payer: Self-pay | Admitting: Internal Medicine

## 2014-06-30 VITALS — BP 120/66 | HR 64 | Temp 98.4°F | Resp 16 | Ht 65.0 in | Wt 230.1 lb

## 2014-06-30 DIAGNOSIS — D6959 Other secondary thrombocytopenia: Secondary | ICD-10-CM

## 2014-06-30 DIAGNOSIS — D638 Anemia in other chronic diseases classified elsewhere: Secondary | ICD-10-CM | POA: Diagnosis not present

## 2014-06-30 DIAGNOSIS — K7460 Unspecified cirrhosis of liver: Secondary | ICD-10-CM

## 2014-06-30 DIAGNOSIS — K5090 Crohn's disease, unspecified, without complications: Secondary | ICD-10-CM

## 2014-06-30 DIAGNOSIS — D731 Hypersplenism: Secondary | ICD-10-CM

## 2014-06-30 MED ORDER — NYSTATIN-TRIAMCINOLONE 100000-0.1 UNIT/GM-% EX CREA: 1.0000 "application " | TOPICAL_CREAM | Freq: Two times a day (BID) | CUTANEOUS | Status: DC

## 2014-06-30 NOTE — Progress Notes (Signed)
Presenting complaint;  Follow-up for Crohn's disease and cirrhosis.  Subjective:  Kathryn [name] is 59 year old Caucasian female who is here for scheduled visit. She was last seen on 04/07/2014. She had appointment 3 weeks ago but she was hospitalized with pneumonia and could not come. She has lost 19 pounds since her last visit. She states her job has changed. 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 She states she walks a lot and does not have any knee pain. She does complain of upper back pain. She denies abdominal pain melena or bleeding into ileostomy. She has noted metallic taste in her mouth since she was treated for pneumonia. She states her illness started with cold like her husband also had he recovered within 2-3 days but she went on to have pneumonia. She presently does not have cough or shortness of breath. She has noted perianal discomfort since she took antibiotics. For back pain she is taking Tylenol and/or tramadol.   Current Medications: Outpatient Encounter Prescriptions as of 06/30/2014  Medication Sig  . acetaminophen (RA ACETAMINOPHEN) 650 MG CR tablet Take 1,300 mg by mouth 2 (two) times daily as needed.  . ALPRAZolam (XANAX) 1 MG tablet Take 1 tablet (1 mg total) by mouth at bedtime as needed for sleep.  . Cholecalciferol (VITAMIN D-1000 MAX ST) 1000 UNITS tablet Take 1,000 Units by mouth daily.  . furosemide (LASIX) 20 MG tablet Take 1 tablet (20 mg total) by mouth daily.  Marland Kitchen gabapentin (NEURONTIN) 100 MG capsule TAKE 1 CAPSULE BY MOUTH THREE TIMES DAILY  . Multiple Vitamins-Minerals (MULTIVITAMIN WITH MINERALS) tablet Take 1 tablet by mouth daily.  . nadolol (CORGARD) 40 MG tablet TAKE 1 TABLET BY MOUTH EVERY DAY  . pantoprazole (PROTONIX) 40 MG tablet TAKE 1 TABLET BY MOUTH DAILY  . Probiotic Product (PROBIOTIC PO) Take 1 tablet by mouth daily.  Marland Kitchen spironolactone (ALDACTONE) 25 MG tablet Take 1 tablet (25 mg total) by mouth daily.  .  traMADol (ULTRAM) 50 MG tablet Take 1 tablet (50 mg total) by mouth every 6 (six) hours as needed for moderate pain. TAKE 1 TABLET BY MOUTH EVERY SIX HOURS AS NEEDED  . [DISCONTINUED] levofloxacin (LEVAQUIN) 750 MG tablet Take 1 tablet (750 mg total) by mouth daily. (Patient not taking: Reported on 06/30/2014)   No facility-administered encounter medications on file as of 06/30/2014.     Objective: Blood pressure 120/66, pulse 64, temperature 98.4 F (36.9 C), temperature source Oral, resp. rate 16, height  (1.651 m), weight 230 lb 1.6 oz (104.373 kg). Patient is alert and in no acute distress. Asterixis absent. Conjunctiva is pink. Sclera is nonicteric Oropharyngeal mucosa is normal. No neck masses or thyromegaly noted. Cardiac exam with regular rhythm normal S1 and S2. No murmur or gallop noted. Lungs are clear to auscultation. Abdomen is full. She has ileostomy in right low quadrant for abdomen. Colostomy bag has pasty greenish stool. Abdomen is soft and nontender. Liver edge is firm and epigastric region and spleen is not palpable. No LE edema or clubbing noted.  Labs/studies Results: Lab data from 06/12/2014 WBC 3.9, H&H 9.3 and 26.9 and platelet count 81K Total bilirubin 1.6, AP 95, AST 22, ALT 13 and albumin 2.5. Abdominopelvic CT without contrast on 06/10/2014. Cirrhotic liver and TIPS in place. No evidence of ascites.   Assessment:  #1. Crohn's disease. She has perianal skin excoriation since she was treated with antibiotic for pneumonia. If she does not respond to  topical therapy would consider flexible sigmoidoscopy. #2. 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 She is status post TIPS in October 2015 without recurrence of bleeding. #3. Anemia. She has history of iron deficiency and has been receiving parenteral iron under supervision of Dr. [name]. H&H has not, as expected and this may be due to recent illness with pneumonia. #4. Cirrhosis secondary  to chronic hepatitis C which has been successfully treated as well as NAFLD. Disease has been complicated by thrombocytopenia. She had ultrasound in March this year. and abdominopelvic CT earlier this month. Therefore she can wait until November before ultrasound performed.   Plan:  Mycolog II cream to be applied to perianal area twice daily for 1-2 weeks thereafter on as-needed basis. Patient will have lab studies on 08/01/2014 to include CBC, LFTs, HCVRNA by PCR, basic metabolic panel AFP and serum magnesium. Patient encouraged to walk regularly and watch calorie intake in order to lose more weight. Office visit in 6 months.

## 2014-06-30 NOTE — Patient Instructions (Addendum)
Apply Mycolog-II cream as corrected twice daily for 1-2 weeks and thereafter on as-needed basis. Next blood work would be on 08/01/2014.

## 2014-07-02 ENCOUNTER — Ambulatory Visit (INDEPENDENT_AMBULATORY_CARE_PROVIDER_SITE_OTHER): Payer: Medicare HMO

## 2014-07-02 ENCOUNTER — Ambulatory Visit (INDEPENDENT_AMBULATORY_CARE_PROVIDER_SITE_OTHER): Payer: Medicare HMO | Admitting: Family

## 2014-07-02 ENCOUNTER — Encounter: Payer: Self-pay | Admitting: Family

## 2014-07-02 VITALS — BP 142/67 | HR 88 | Temp 97.9°F | Ht 65.0 in | Wt 232.0 lb

## 2014-07-02 DIAGNOSIS — J189 Pneumonia, unspecified organism: Secondary | ICD-10-CM

## 2014-07-02 DIAGNOSIS — N390 Urinary tract infection, site not specified: Secondary | ICD-10-CM

## 2014-07-02 DIAGNOSIS — Z09 Encounter for follow-up examination after completed treatment for conditions other than malignant neoplasm: Secondary | ICD-10-CM | POA: Diagnosis not present

## 2014-07-02 LAB — POCT URINALYSIS DIPSTICK
Bilirubin, UA: NEGATIVE
GLUCOSE UA: NEGATIVE
Ketones, UA: NEGATIVE
Leukocytes, UA: NEGATIVE
NITRITE UA: NEGATIVE
PH UA: 6.5
PROTEIN UA: NEGATIVE
SPEC GRAV UA: 1.01
Urobilinogen, UA: NEGATIVE

## 2014-07-02 LAB — POCT UA - MICROSCOPIC ONLY
Bacteria, U Microscopic: NEGATIVE
CRYSTALS, UR, HPF, POC: NEGATIVE
Casts, Ur, LPF, POC: NEGATIVE
MUCUS UA: NEGATIVE
RBC, URINE, MICROSCOPIC: NEGATIVE
Yeast, UA: NEGATIVE

## 2014-07-02 NOTE — Patient Instructions (Signed)
Pneumonia °Pneumonia is an infection of the lungs.  °CAUSES °Pneumonia may be caused by bacteria or a virus. Usually, these infections are caused by breathing infectious particles into the lungs (respiratory tract). °SIGNS AND SYMPTOMS  °· Cough. °· Fever. °· Chest pain. °· Increased rate of breathing. °· Wheezing. °· Mucus production. °DIAGNOSIS  °If you have the common symptoms of pneumonia, your health care provider will typically confirm the diagnosis with a chest X-ray. The X-ray will show an abnormality in the lung (pulmonary infiltrate) if you have pneumonia. Other tests of your blood, urine, or sputum may be done to find the specific cause of your pneumonia. Your health care provider may also do tests (blood gases or pulse oximetry) to see how well your lungs are working. °TREATMENT  °Some forms of pneumonia may be spread to other people when you cough or sneeze. You may be asked to wear a mask before and during your exam. Pneumonia that is caused by bacteria is treated with antibiotic medicine. Pneumonia that is caused by the influenza virus may be treated with an antiviral medicine. Most other viral infections must run their course. These infections will not respond to antibiotics.  °HOME CARE INSTRUCTIONS  °· Cough suppressants may be used if you are losing too much rest. However, coughing protects you by clearing your lungs. You should avoid using cough suppressants if you can. °· Your health care provider may have prescribed medicine if he or she thinks your pneumonia is caused by bacteria or influenza. Finish your medicine even if you start to feel better. °· Your health care provider may also prescribe an expectorant. This loosens the mucus to be coughed up. °· Take medicines only as directed by your health care provider. °· Do not smoke. Smoking is a common cause of bronchitis and can contribute to pneumonia. If you are a smoker and continue to smoke, your cough may last several weeks after your  pneumonia has cleared. °· A cold steam vaporizer or humidifier in your room or home may help loosen mucus. °· Coughing is often worse at night. Sleeping in a semi-upright position in a recliner or using a couple pillows under your head will help with this. °· Get rest as you feel it is needed. Your body will usually let you know when you need to rest. °PREVENTION °A pneumococcal shot (vaccine) is available to prevent a common bacterial cause of pneumonia. This is usually suggested for: °· People over 65 years old. °· Patients on chemotherapy. °· People with chronic lung problems, such as bronchitis or emphysema. °· People with immune system problems. °If you are over 65 or have a high risk condition, you may receive the pneumococcal vaccine if you have not received it before. In some countries, a routine influenza vaccine is also recommended. This vaccine can help prevent some cases of pneumonia. You may be offered the influenza vaccine as part of your care. °If you smoke, it is time to quit. You may receive instructions on how to stop smoking. Your health care provider can provide medicines and counseling to help you quit. °SEEK MEDICAL CARE IF: °You have a fever. °SEEK IMMEDIATE MEDICAL CARE IF:  °· Your illness becomes worse. This is especially true if you are elderly or weakened from any other disease. °· You cannot control your cough with suppressants and are losing sleep. °· You begin coughing up blood. °· You develop pain which is getting worse or is uncontrolled with medicines. °· Any of the symptoms   which initially brought you in for treatment are getting worse rather than better. °· You develop shortness of breath or chest pain. °MAKE SURE YOU:  °· Understand these instructions. °· Will watch your condition. °· Will get help right away if you are not doing well or get worse. °Document Released: 01/24/2005 Document Revised: 06/10/2013 Document Reviewed: 04/15/2010 °ExitCare® Patient Information ©2015  [employer]. This information is not intended to replace advice given to you by your health care provider. Make sure you discuss any questions you have with your health care provider. ° °Urinary Tract Infection °Urinary tract infections (UTIs) can develop anywhere along your urinary tract. Your urinary tract is your body's drainage system for removing wastes and extra water. Your urinary tract includes two kidneys, two ureters, a bladder, and a urethra. Your kidneys are a pair of bean-shaped organs. Each kidney is about the size of your fist. They are located below your ribs, one on each side of your spine. °CAUSES °Infections are caused by microbes, which are microscopic organisms, including fungi, viruses, and bacteria. These organisms are so small that they can only be seen through a microscope. Bacteria are the microbes that most commonly cause UTIs. °SYMPTOMS  °Symptoms of UTIs may vary by age and gender of the patient and by the location of the infection. Symptoms in young women typically include a frequent and intense urge to urinate and a painful, burning feeling in the bladder or urethra during urination. Older women and men are more likely to be tired, shaky, and weak and have muscle aches and abdominal pain. A fever may mean the infection is in your kidneys. Other symptoms of a kidney infection include pain in your back or sides below the ribs, nausea, and vomiting. °DIAGNOSIS °To diagnose a UTI, your caregiver will ask you about your symptoms. Your caregiver also will ask to provide a urine sample. The urine sample will be tested for bacteria and white blood cells. White blood cells are made by your body to help fight infection. °TREATMENT  °Typically, UTIs can be treated with medication. Because most UTIs are caused by a bacterial infection, they usually can be treated with the use of antibiotics. The choice of antibiotic and length of treatment depend on your symptoms and the type of bacteria causing  your infection. °HOME CARE INSTRUCTIONS °· If you were prescribed antibiotics, take them exactly as your caregiver instructs you. Finish the medication even if you feel better after you have only taken some of the medication. °· Drink enough water and fluids to keep your urine clear or pale yellow. °· Avoid caffeine, tea, and carbonated beverages. They tend to irritate your bladder. °· Empty your bladder often. Avoid holding urine for long periods of time. °· Empty your bladder before and after sexual intercourse. °· After a bowel movement, women should cleanse from front to back. Use each tissue only once. °SEEK MEDICAL CARE IF:  °· You have back pain. °· You develop a fever. °· Your symptoms do not begin to resolve within 3 days. °SEEK IMMEDIATE MEDICAL CARE IF:  °· You have severe back pain or lower abdominal pain. °· You develop chills. °· You have nausea or vomiting. °· You have continued burning or discomfort with urination. °MAKE SURE YOU:  °· Understand these instructions. °· Will watch your condition. °· Will get help right away if you are not doing well or get worse. °Document Released: 11/03/2004 Document Revised: 07/26/2011 Document Reviewed: 03/04/2011 °ExitCare® Patient Information ©2015 [employer].   This information is not intended to replace advice given to you by your health care provider. Make sure you discuss any questions you have with your health care provider. ° °

## 2014-07-02 NOTE — Progress Notes (Signed)
   Subjective:    Patient ID: Kathryn [name], female    DOB: 10/23/1955, 59 y.o.   MRN: [id number]  HPI Pt presents to the office today for hospital follow up. Pt went to the ED on 06/09/14 for fevers and coughing. Pt was diagnosed with pneumonia and a UTI. PT states she is doing a lot better. Pt states she has finished her antibiotics. Pt denies any headache, palpitations, SOB, coughing, fevers, dysuria, or edema at this time.   *ED notes reviewed.  Review of Systems  Constitutional: Negative.   HENT: Negative.   Eyes: Negative.   Respiratory: Negative.  Negative for shortness of breath.   Cardiovascular: Negative.  Negative for palpitations.  Gastrointestinal: Negative.   Endocrine: Negative.   Genitourinary: Negative.   Musculoskeletal: Negative.   Neurological: Negative.  Negative for headaches.  Hematological: Negative.   Psychiatric/Behavioral: Negative.   All other systems reviewed and are negative.      Objective:   Physical Exam  Constitutional: She is oriented to person, place, and time. She appears well-developed and well-nourished. No distress.  HENT:  Head: Normocephalic and atraumatic.  Right Ear: External ear normal.  Left Ear: External ear normal.  Nose: Nose normal.  Mouth/Throat: Oropharynx is clear and moist.  Eyes: Pupils are equal, round, and reactive to light.  Neck: Normal range of motion. Neck supple. No thyromegaly present.  Cardiovascular: Normal rate, regular rhythm, normal heart sounds and intact distal pulses.   No murmur heard. Pulmonary/Chest: Effort normal and breath sounds normal. No respiratory distress. She has no wheezes.  Abdominal: Soft. Bowel sounds are normal. She exhibits no distension. There is no tenderness.  Musculoskeletal: Normal range of motion. She exhibits edema (Trace amt in BLE). She exhibits no tenderness.  Neurological: She is alert and oriented to person, place, and time. She has normal reflexes. No cranial nerve deficit.    Skin: Skin is warm and dry.  Psychiatric: She has a normal mood and affect. Her behavior is normal. Judgment and thought content normal.  Vitals reviewed.   BP 142/67 mmHg  Pulse 88  Temp(Src) 97.9 F (36.6 C) (Oral)  Ht 5' 5" (1.651 m)  Wt 232 lb (105.235 kg)  BMI 38.61 kg/m2  Chest X-ray- WNL Preliminary reading by [name], FNP [facility]      Assessment & Plan:  1. Urinary tract infection without hematuria, site unspecified -Force fluids -Wipe front to back - POCT urinalysis dipstick - POCT UA - Microscopic Only - BMP8+EGFR  2. Hospital discharge follow-up - BMP8+EGFR  3. CAP (community acquired pneumonia) -Force fluids -RTO prn - DG Chest 2 View; Future - BMP8+EGFR  [name], FNP

## 2014-07-03 ENCOUNTER — Other Ambulatory Visit (INDEPENDENT_AMBULATORY_CARE_PROVIDER_SITE_OTHER): Payer: Self-pay | Admitting: *Deleted

## 2014-07-03 ENCOUNTER — Encounter (INDEPENDENT_AMBULATORY_CARE_PROVIDER_SITE_OTHER): Payer: Self-pay | Admitting: *Deleted

## 2014-07-03 DIAGNOSIS — K7460 Unspecified cirrhosis of liver: Secondary | ICD-10-CM

## 2014-07-03 DIAGNOSIS — D638 Anemia in other chronic diseases classified elsewhere: Secondary | ICD-10-CM

## 2014-07-03 DIAGNOSIS — K5090 Crohn's disease, unspecified, without complications: Secondary | ICD-10-CM

## 2014-07-03 DIAGNOSIS — D6959 Other secondary thrombocytopenia: Secondary | ICD-10-CM

## 2014-07-03 LAB — BMP8+EGFR
BUN/Creatinine Ratio: 10 (ref 9–23)
BUN: 13 mg/dL (ref 6–24)
CO2: 20 mmol/L (ref 18–29)
Calcium: 8.3 mg/dL — ABNORMAL LOW (ref 8.7–10.2)
Chloride: 102 mmol/L (ref 97–108)
Creatinine, Ser: 1.24 mg/dL — ABNORMAL HIGH (ref 0.57–1.00)
GFR calc Af Amer: 55 mL/min/1.73 — ABNORMAL LOW
GFR calc non Af Amer: 48 mL/min/1.73 — ABNORMAL LOW
Glucose: 109 mg/dL — ABNORMAL HIGH (ref 65–99)
Potassium: 3.5 mmol/L (ref 3.5–5.2)
Sodium: 137 mmol/L (ref 134–144)

## 2014-07-09 ENCOUNTER — Encounter: Payer: Self-pay | Admitting: *Deleted

## 2014-07-17 ENCOUNTER — Encounter (HOSPITAL_COMMUNITY): Payer: Medicare HMO | Attending: Hematology and Oncology

## 2014-07-17 DIAGNOSIS — E611 Iron deficiency: Secondary | ICD-10-CM

## 2014-07-17 DIAGNOSIS — K922 Gastrointestinal hemorrhage, unspecified: Secondary | ICD-10-CM | POA: Insufficient documentation

## 2014-07-17 DIAGNOSIS — K50911 Crohn's disease, unspecified, with rectal bleeding: Secondary | ICD-10-CM

## 2014-07-17 DIAGNOSIS — D62 Acute posthemorrhagic anemia: Secondary | ICD-10-CM | POA: Diagnosis not present

## 2014-07-17 DIAGNOSIS — R300 Dysuria: Secondary | ICD-10-CM

## 2014-07-17 LAB — CBC WITH DIFFERENTIAL/PLATELET
BASOS PCT: 1 % (ref 0–1)
Basophils Absolute: 0 10*3/uL (ref 0.0–0.1)
EOS PCT: 4 % (ref 0–5)
Eosinophils Absolute: 0.2 10*3/uL (ref 0.0–0.7)
HEMATOCRIT: 29.7 % — AB (ref 36.0–46.0)
HEMOGLOBIN: 10 g/dL — AB (ref 12.0–15.0)
Lymphocytes Relative: 33 % (ref 12–46)
Lymphs Abs: 1.2 10*3/uL (ref 0.7–4.0)
MCH: 31.3 pg (ref 26.0–34.0)
MCHC: 33.7 g/dL (ref 30.0–36.0)
MCV: 92.8 fL (ref 78.0–100.0)
Monocytes Absolute: 0.3 10*3/uL (ref 0.1–1.0)
Monocytes Relative: 9 % (ref 3–12)
Neutro Abs: 1.9 10*3/uL (ref 1.7–7.7)
Neutrophils Relative %: 53 % (ref 43–77)
Platelets: 64 10*3/uL — ABNORMAL LOW (ref 150–400)
RBC: 3.2 MIL/uL — ABNORMAL LOW (ref 3.87–5.11)
RDW: 14.2 % (ref 11.5–15.5)
WBC: 3.5 10*3/uL — ABNORMAL LOW (ref 4.0–10.5)

## 2014-07-17 LAB — IRON AND TIBC
Iron: 156 ug/dL (ref 28–170)
Saturation Ratios: 53 % — ABNORMAL HIGH (ref 10.4–31.8)
TIBC: 295 ug/dL (ref 250–450)
UIBC: 139 ug/dL

## 2014-07-17 LAB — COMPREHENSIVE METABOLIC PANEL
ALK PHOS: 148 U/L — AB (ref 38–126)
ALT: 15 U/L (ref 14–54)
AST: 29 U/L (ref 15–41)
Albumin: 2.8 g/dL — ABNORMAL LOW (ref 3.5–5.0)
Anion gap: 7 (ref 5–15)
BILIRUBIN TOTAL: 1.6 mg/dL — AB (ref 0.3–1.2)
BUN: 23 mg/dL — AB (ref 6–20)
CALCIUM: 8.2 mg/dL — AB (ref 8.9–10.3)
CHLORIDE: 110 mmol/L (ref 101–111)
CO2: 18 mmol/L — ABNORMAL LOW (ref 22–32)
Creatinine, Ser: 1.58 mg/dL — ABNORMAL HIGH (ref 0.44–1.00)
GFR calc non Af Amer: 35 mL/min — ABNORMAL LOW (ref >60)
GFR, EST AFRICAN AMERICAN: 41 mL/min — AB (ref >60)
Glucose, Bld: 190 mg/dL — ABNORMAL HIGH (ref 65–99)
Potassium: 3.4 mmol/L — ABNORMAL LOW (ref 3.5–5.1)
Sodium: 135 mmol/L (ref 135–145)
Total Protein: 5.5 g/dL — ABNORMAL LOW (ref 6.5–8.1)

## 2014-07-17 LAB — FERRITIN: FERRITIN: 94 ng/mL (ref 11–307)

## 2014-07-18 ENCOUNTER — Encounter (HOSPITAL_BASED_OUTPATIENT_CLINIC_OR_DEPARTMENT_OTHER): Payer: Medicare HMO | Admitting: Oncology

## 2014-07-18 ENCOUNTER — Encounter (HOSPITAL_COMMUNITY): Payer: Self-pay | Admitting: Oncology

## 2014-07-18 ENCOUNTER — Ambulatory Visit (HOSPITAL_COMMUNITY): Payer: Medicare HMO | Admitting: Oncology

## 2014-07-18 VITALS — BP 122/61 | HR 64 | Temp 98.6°F | Resp 18 | Wt 222.6 lb

## 2014-07-18 DIAGNOSIS — E611 Iron deficiency: Secondary | ICD-10-CM | POA: Diagnosis not present

## 2014-07-18 DIAGNOSIS — D696 Thrombocytopenia, unspecified: Secondary | ICD-10-CM

## 2014-07-18 DIAGNOSIS — D62 Acute posthemorrhagic anemia: Secondary | ICD-10-CM

## 2014-07-18 NOTE — Progress Notes (Signed)
[name], MD [address] [place] [address]  Iron deficiency - Plan: CBC with Differential, Iron and TIBC, Ferritin  Acute blood loss anemia - Plan: CBC with Differential, Iron and TIBC, Ferritin  Thrombocytopenia - Plan: CBC with Differential  CURRENT THERAPY: IV iron support  INTERVAL HISTORY: Kathryn [name] 59 y.o. female returns for followup of iron deficiency with intolerance to oral iron in the setting of pancytopenia with esophageal varices, hepatitis C with cirrhosis and splenomegaly (S/P Harvoni Tx in 09/2013), and Crohn's Disease S/P ileostomy.  I personally reviewed and went over laboratory results with the patient.  The results are noted within this dictation. She has a normocytic, normochromic anemia that is stable and a ferritin of 94 without a significant elevation in TIBC.  She also has an intermittent leukopenia with a stable thrombocytopenia.  She denies any complaints today.      Past Medical History  Diagnosis Date  . Hypertension   . Enteritis presumed infectious   . Crohn's   . Crohn's   . Anemia   . Hepatitis C antibody test positive   . Hepatitis   . Renal insufficiency   . Cirrhosis   . Splenomegaly   . Bilateral leg edema 01/2014    has Hypertension; Crohn's disease; Acute blood loss anemia; Hepatic cirrhosis; Hepatitis C antibody test positive; Hepatitis C reactive; GERD (gastroesophageal reflux disease); Peripheral neuropathy; Insomnia; GI bleed; Thrombocytopenia; UTI (urinary tract infection); Coagulopathy; Exertional dyspnea; Weakness; GI bleeding; CKD (chronic kidney disease), stage III; Anemia of chronic disease; Iron deficiency; Community acquired pneumonia; CAP (community acquired pneumonia); Liver cirrhosis; Acute on chronic renal failure; Hyponatremia; Cirrhosis; and Hypokalemia on her problem list.     is allergic to penicillins.  Current Outpatient Prescriptions on File Prior to Visit  Medication Sig Dispense Refill   . acetaminophen (RA ACETAMINOPHEN) 650 MG CR tablet Take 1,300 mg by mouth 2 (two) times daily as needed.    . ALPRAZolam (XANAX) 1 MG tablet Take 1 tablet (1 mg total) by mouth at bedtime as needed for sleep. 30 tablet 0  . Cholecalciferol (VITAMIN D-1000 MAX ST) 1000 UNITS tablet Take 1,000 Units by mouth daily.    . furosemide (LASIX) 20 MG tablet Take 1 tablet (20 mg total) by mouth daily. 30 tablet 3  . gabapentin (NEURONTIN) 100 MG capsule TAKE 1 CAPSULE BY MOUTH THREE TIMES DAILY 270 capsule 0  . Multiple Vitamins-Minerals (MULTIVITAMIN WITH MINERALS) tablet Take 1 tablet by mouth daily.    . nadolol (CORGARD) 40 MG tablet TAKE 1 TABLET BY MOUTH EVERY DAY 30 tablet 3  . nystatin-triamcinolone (MYCOLOG II) cream Apply 1 application topically 2 (two) times daily. 30 g 0  . pantoprazole (PROTONIX) 40 MG tablet TAKE 1 TABLET BY MOUTH DAILY 30 tablet 3  . Probiotic Product (PROBIOTIC PO) Take 1 tablet by mouth daily.    Marland Kitchen spironolactone (ALDACTONE) 25 MG tablet Take 1 tablet (25 mg total) by mouth daily. 60 tablet 3  . traMADol (ULTRAM) 50 MG tablet Take 1 tablet (50 mg total) by mouth every 6 (six) hours as needed for moderate pain. TAKE 1 TABLET BY MOUTH EVERY SIX HOURS AS NEEDED 60 tablet 0   No current facility-administered medications on file prior to visit.    Past Surgical History  Procedure Laterality Date  . Colon surgery      MULTIPLE SURGERIES FOR CROHNS  . Ileostomy      multiple abdominal surgeries  for Crohn's by Dr. Gabriel Cirri  . Cholecystectomy    . Abscess drainage      abdominal  . Esophagogastroduodenoscopy  10/05/2010    Procedure: ESOPHAGOGASTRODUODENOSCOPY (EGD);  Surgeon: [name], MD;  Location: [facility];  Service: Endoscopy;  Laterality: N/A;  7:30 am  . Cirrhosis    . Esophagogastroduodenoscopy N/A 09/11/2013    Procedure: ESOPHAGOGASTRODUODENOSCOPY (EGD);  Surgeon: [name], MD;  Location: [facility];  Service: Endoscopy;  Laterality: N/A;    . Ileoscopy  09/11/2013    Procedure: ILEOSCOPY THROUGH STOMA;  Surgeon: [name], MD;  Location: [facility];  Service: Endoscopy;;  . Givens capsule study N/A 09/12/2013    Procedure: GIVENS CAPSULE STUDY;  Surgeon: [name], MD;  Location: [facility];  Service: Endoscopy;  Laterality: N/A;  . Givens capsule study N/A 11/25/2013    Procedure: GIVENS CAPSULE STUDY;  Surgeon: [name], MD;  Location: [facility];  Service: Endoscopy;  Laterality: N/A;  . Varices cauterization Right     in the stoma of her ileostomy  . Tips procedure      [facility] in October    Denies any headaches, dizziness, double vision, fevers, chills, night sweats, nausea, vomiting, diarrhea, constipation, chest pain, heart palpitations, shortness of breath, blood in stool, black tarry stool, urinary pain, urinary burning, urinary frequency, hematuria.   PHYSICAL EXAMINATION  ECOG PERFORMANCE STATUS: 1 - Symptomatic but completely ambulatory  Filed Vitals:   07/18/14 1400  BP: 122/61  Pulse: 64  Temp: 98.6 F (37 C)  Resp: 18    GENERAL:alert, no distress, well nourished, well developed, comfortable, cooperative, obese and smiling SKIN: skin color, texture, turgor are normal, no rashes or significant lesions HEAD: Normocephalic, No masses, lesions, tenderness or abnormalities EYES: normal, PERRLA, EOMI, Conjunctiva are pink and non-injected EARS: External ears normal OROPHARYNX:lips, buccal mucosa, and tongue normal and mucous membranes are moist  NECK: supple, trachea midline LYMPH:  not examined BREAST:not examined LUNGS: clear to auscultation  HEART: regular rate & rhythm, no murmurs and no gallops ABDOMEN:obese BACK: Back symmetric, no curvature. EXTREMITIES:less then 2 second capillary refill, no joint deformities, effusion, or inflammation, no skin discoloration  NEURO: alert & oriented x 3 with fluent speech, no focal motor/sensory deficits, gait normal    LABORATORY  DATA: CBC    Component Value Date/Time   WBC 3.5* 07/17/2014 1328   WBC 4.7 12/23/2013 1601   RBC 3.20* 07/17/2014 1328   RBC 3.38* 01/09/2014 1600   RBC 3.75* 12/23/2013 1601   HGB 10.0* 07/17/2014 1328   HCT 29.7* 07/17/2014 1328   PLT 64* 07/17/2014 1328   MCV 92.8 07/17/2014 1328   MCH 31.3 07/17/2014 1328   MCH 26.4* 12/23/2013 1601   MCHC 33.7 07/17/2014 1328   MCHC 32.5 12/23/2013 1601   RDW 14.2 07/17/2014 1328   RDW 16.3* 12/23/2013 1601   LYMPHSABS 1.2 07/17/2014 1328   LYMPHSABS 0.9 12/23/2013 1601   MONOABS 0.3 07/17/2014 1328   EOSABS 0.2 07/17/2014 1328   EOSABS 0.1 12/23/2013 1601   BASOSABS 0.0 07/17/2014 1328   BASOSABS 0.0 12/23/2013 1601      Chemistry      Component Value Date/Time   NA 135 07/17/2014 1328   NA 137 07/02/2014 1544   K 3.4* 07/17/2014 1328   CL 110 07/17/2014 1328   CO2 18* 07/17/2014 1328   BUN 23* 07/17/2014 1328   BUN 13 07/02/2014 1544   CREATININE  1.58* 07/17/2014 1328   CREATININE 1.30* 05/12/2014 1507      Component Value Date/Time   CALCIUM 8.2* 07/17/2014 1328   ALKPHOS 148* 07/17/2014 1328   AST 29 07/17/2014 1328   ALT 15 07/17/2014 1328   BILITOT 1.6* 07/17/2014 1328     Lab Results  Component Value Date   IRON 156 07/17/2014   TIBC 295 07/17/2014   FERRITIN 94 07/17/2014     RADIOGRAPHIC STUDIES:  Dg Chest 2 View  07/02/2014   CLINICAL DATA:  History of pneumonia, followup  EXAM: CHEST  2 VIEW  COMPARISON:  None.  FINDINGS: Linear opacity is noted medially at the left lung base which may represent residual atelectasis. No definite pneumonia or effusion is seen. Mediastinal and hilar contours are unremarkable. The heart is within normal limits in size. Surgical clips are present in the right upper quadrant from prior cholecystectomy.  IMPRESSION: Probable linear atelectasis or scarring medially at the left lung base. No definite residual pneumonia. No effusion.   Electronically Signed   By: Dwyane Dee M.D.    On: 07/02/2014 16:02     ASSESSMENT AND PLAN:  Iron deficiency Iron deficiency with intolerance to oral iron in the setting of pancytopenia with esophageal varices, hepatitis C with cirrhosis and splenomegaly (S/P Harvoni Tx in 09/2013), and Crohn's Disease S/P ileostomy.  Labs in 3 months: CBC diff, iron/TIBC, ferritin.  Return in 3 months for follow-up.   THERAPY PLAN:  Will continue to monitor labs and provide Iron support IV.  All questions were answered. The patient knows to call the clinic with any problems, questions or concerns. We can certainly see the patient much sooner if necessary.  Patient and plan discussed with Dr. Loma Messing and she is in agreement with the aforementioned.   This note is electronically signed by: Tina Griffiths 07/18/2014 3:40 PM

## 2014-07-18 NOTE — Patient Instructions (Addendum)
[facility] Discharge Instructions  RECOMMENDATIONS MADE BY THE CONSULTANT AND ANY TEST RESULTS WILL BE SENT TO YOUR REFERRING PHYSICIAN.  Exam and discussion today with Jenita Seashore, PA. Return in 3 months for lab work and office visit.  Thank you for choosing [facility] at [facility] to provide your oncology and hematology care.  To afford each patient quality time with our provider, please arrive at least 15 minutes before your scheduled appointment time.    You need to re-schedule your appointment should you arrive 10 or more minutes late.  We strive to give you quality time with our providers, and arriving late affects you and other patients whose appointments are after yours.  Also, if you no show three or more times for appointments you may be dismissed from the clinic at the providers discretion.     Again, thank you for choosing [facility].  Our hope is that these requests will decrease the amount of time that you wait before being seen by our physicians.       _____________________________________________________________  Should you have questions after your visit to [facility], please contact our office at [phone] between the hours of 8:30 a.m. and 4:30 p.m.  Voicemails left after 4:30 p.m. will not be returned until the following business day.  For prescription refill requests, have your pharmacy contact our office.

## 2014-07-18 NOTE — Assessment & Plan Note (Signed)
Iron deficiency with intolerance to oral iron in the setting of pancytopenia with esophageal varices, hepatitis C with cirrhosis and splenomegaly (S/P Harvoni Tx in 09/2013), and Crohn's Disease S/P ileostomy.  Labs in 3 months: CBC diff, iron/TIBC, ferritin.  Return in 3 months for follow-up.

## 2014-07-25 ENCOUNTER — Ambulatory Visit (INDEPENDENT_AMBULATORY_CARE_PROVIDER_SITE_OTHER): Payer: Medicare HMO | Admitting: Family

## 2014-07-25 ENCOUNTER — Encounter: Payer: Self-pay | Admitting: Family

## 2014-07-25 VITALS — BP 119/59 | HR 90 | Temp 98.2°F | Ht 65.0 in | Wt 226.0 lb

## 2014-07-25 DIAGNOSIS — L259 Unspecified contact dermatitis, unspecified cause: Secondary | ICD-10-CM

## 2014-07-25 MED ORDER — TRIAMCINOLONE ACETONIDE 0.5 % EX OINT: 1.0000 "application " | TOPICAL_OINTMENT | Freq: Two times a day (BID) | CUTANEOUS | Status: DC

## 2014-07-25 MED ORDER — TRAMADOL HCL 50 MG PO TABS: 50.0000 mg | ORAL_TABLET | Freq: Four times a day (QID) | ORAL | Status: DC | PRN

## 2014-07-25 MED ORDER — METHYLPREDNISOLONE 4 MG PO TBPK: ORAL_TABLET | ORAL | Status: DC

## 2014-07-25 NOTE — Patient Instructions (Signed)

## 2014-07-25 NOTE — Progress Notes (Signed)
   Subjective:    Patient ID: Kathryn [name], female    DOB: 04/08/1955, 59 y.o.   MRN: [id number]  Rash This is a new problem. The current episode started in the past 7 days. The problem has been gradually worsening since onset. The affected locations include the abdomen, neck and groin. The rash is characterized by itchiness, burning and redness. She was exposed to nothing. Pertinent negatives include no congestion, cough, diarrhea, fever, joint pain, shortness of breath, sore throat or vomiting. Past treatments include moisturizer and anti-itch cream. The treatment provided mild relief. There is no history of allergies or asthma.      Review of Systems  Constitutional: Negative.  Negative for fever.  HENT: Negative.  Negative for congestion and sore throat.   Eyes: Negative.   Respiratory: Negative.  Negative for cough and shortness of breath.   Cardiovascular: Negative.  Negative for palpitations.  Gastrointestinal: Negative.  Negative for vomiting and diarrhea.  Endocrine: Negative.   Genitourinary: Negative.   Musculoskeletal: Negative.  Negative for joint pain.  Skin: Positive for rash.  Neurological: Negative.  Negative for headaches.  Hematological: Negative.   Psychiatric/Behavioral: Negative.   All other systems reviewed and are negative.      Objective:   Physical Exam  Constitutional: She is oriented to person, place, and time. She appears well-developed and well-nourished. No distress.  HENT:  Head: Normocephalic and atraumatic.  Eyes: Pupils are equal, round, and reactive to light.  Neck: Normal range of motion. Neck supple. No thyromegaly present.  Cardiovascular: Normal rate, regular rhythm, normal heart sounds and intact distal pulses.   No murmur heard. Pulmonary/Chest: Effort normal and breath sounds normal. No respiratory distress. She has no wheezes.  Abdominal: Soft. Bowel sounds are normal. She exhibits no distension. There is no tenderness.    Musculoskeletal: Normal range of motion. She exhibits no edema or tenderness.  Neurological: She is alert and oriented to person, place, and time. She has normal reflexes. No cranial nerve deficit.  Skin: Skin is warm and dry. Rash noted. There is erythema.  generalized erythemas rash on left abdomen   Psychiatric: She has a normal mood and affect. Her behavior is normal. Judgment and thought content normal.  Vitals reviewed.   BP 119/59 mmHg  Pulse 90  Temp(Src) 98.2 F (36.8 C) (Oral)  Ht  (1.651 m)  Wt 226 lb (102.513 kg)  BMI 37.61 kg/m2       Assessment & Plan:  1. Contact dermatitis -Do not scratch -Keep clean and dry -Antihistamine take as daily - triamcinolone ointment (KENALOG) 0.5 %; Apply 1 application topically 2 (two) times daily.  Dispense: 30 g; Refill: 0 - methylPREDNISolone (MEDROL DOSEPAK) 4 MG TBPK tablet; Use as directed  Dispense: 21 tablet; Refill: 0  [name], FNP

## 2014-08-04 ENCOUNTER — Ambulatory Visit (INDEPENDENT_AMBULATORY_CARE_PROVIDER_SITE_OTHER): Payer: Medicare HMO | Admitting: Family Medicine

## 2014-08-04 ENCOUNTER — Encounter: Payer: Self-pay | Admitting: Family Medicine

## 2014-08-04 VITALS — BP 139/73 | HR 75 | Temp 100.1°F | Ht 65.0 in | Wt 226.0 lb

## 2014-08-04 DIAGNOSIS — J0120 Acute ethmoidal sinusitis, unspecified: Secondary | ICD-10-CM

## 2014-08-04 DIAGNOSIS — R509 Fever, unspecified: Secondary | ICD-10-CM

## 2014-08-04 MED ORDER — CEFDINIR 300 MG PO CAPS
300.0000 mg | ORAL_CAPSULE | Freq: Two times a day (BID) | ORAL | Status: DC
Start: 2014-08-04 — End: 2014-10-20

## 2014-08-04 NOTE — Patient Instructions (Signed)
Drink plenty of fluids Use nasal saline frequently through the day in each nostril Take Mucinex maximum strength 1 twice daily for cough and congestion with a large glass of water as directed on the paper given to you during the visit Take the antibiotic as directed until completed Take Tylenol as needed for aches pains and fever

## 2014-08-04 NOTE — Progress Notes (Signed)
Subjective:    Patient ID: Kathryn [name], female    DOB: 11/13/1955, 59 y.o.   MRN: [id number]  HPI  Patient here today for sinus trouble that started about 2 days. The patient has had fever or sinus drainage and sinus pressure and this is been going on for 2 days. She is allergic to penicillins. The patient has a history of Crohn's disease with hepatitis C. She says her grandchildren have allergies and her son has allergies and all of them have been feeling bad the past few days. She had a temperature of the 100.8 last night. She has minimal sputum production with coughing and this is cloudy and white in color. It is important to note that the patient has tolerated taken cephalexin in the past even though she is allergic to penicillin.     Patient Active Problem List   Diagnosis Date Noted  . Cirrhosis   . Hypokalemia   . Community acquired pneumonia 06/09/2014  . CAP (community acquired pneumonia) 06/09/2014  . Liver cirrhosis 06/09/2014  . Acute on chronic renal failure 06/09/2014  . Hyponatremia   . Iron deficiency 01/09/2014  . GI bleeding 11/25/2013  . CKD (chronic kidney disease), stage III 11/25/2013  . Anemia of chronic disease 11/25/2013  . Exertional dyspnea 09/30/2013  . Weakness 09/30/2013  . Thrombocytopenia 09/10/2013  . UTI (urinary tract infection) 09/10/2013  . Coagulopathy 09/10/2013  . GI bleed 09/09/2013  . GERD (gastroesophageal reflux disease) 07/04/2013  . Peripheral neuropathy 07/04/2013  . Insomnia 07/04/2013  . Hepatitis C reactive 01/14/2013  . Hepatitis C antibody test positive 12/20/2012  . Hepatic cirrhosis 11/14/2012  . Acute blood loss anemia 09/02/2010  . Hypertension   . Crohn's disease    Outpatient Encounter Prescriptions as of 08/04/2014  Medication Sig  . acetaminophen (RA ACETAMINOPHEN) 650 MG CR tablet Take 1,300 mg by mouth 2 (two) times daily as needed.  . ALPRAZolam (XANAX) 1 MG tablet Take 1 tablet (1 mg total) by mouth at  bedtime as needed for sleep.  . Cholecalciferol (VITAMIN D-1000 MAX ST) 1000 UNITS tablet Take 1,000 Units by mouth daily.  . furosemide (LASIX) 20 MG tablet Take 1 tablet (20 mg total) by mouth daily.  Marland Kitchen gabapentin (NEURONTIN) 100 MG capsule TAKE 1 CAPSULE BY MOUTH THREE TIMES DAILY  . Multiple Vitamins-Minerals (MULTIVITAMIN WITH MINERALS) tablet Take 1 tablet by mouth daily.  . nadolol (CORGARD) 40 MG tablet TAKE 1 TABLET BY MOUTH EVERY DAY  . nystatin-triamcinolone (MYCOLOG II) cream Apply 1 application topically 2 (two) times daily.  . pantoprazole (PROTONIX) 40 MG tablet TAKE 1 TABLET BY MOUTH DAILY  . Probiotic Product (PROBIOTIC PO) Take 1 tablet by mouth daily.  Marland Kitchen spironolactone (ALDACTONE) 25 MG tablet Take 1 tablet (25 mg total) by mouth daily.  . traMADol (ULTRAM) 50 MG tablet Take 1 tablet (50 mg total) by mouth every 6 (six) hours as needed for moderate pain. TAKE 1 TABLET BY MOUTH EVERY SIX HOURS AS NEEDED  . triamcinolone ointment (KENALOG) 0.5 % Apply 1 application topically 2 (two) times daily.  . [DISCONTINUED] methylPREDNISolone (MEDROL DOSEPAK) 4 MG TBPK tablet Use as directed   No facility-administered encounter medications on file as of 08/04/2014.      Review of Systems  Constitutional: Positive for fever.  HENT: Positive for postnasal drip and sinus pressure.   Eyes: Negative.   Respiratory: Negative.   Cardiovascular: Negative.   Gastrointestinal: Negative.   Endocrine: Negative.   Genitourinary: Negative.  Musculoskeletal: Negative.   Skin: Negative.   Allergic/Immunologic: Negative.   Neurological: Negative.   Hematological: Negative.   Psychiatric/Behavioral: Negative.        Objective:   Physical Exam  Constitutional: She is oriented to person, place, and time. She appears well-developed and well-nourished. No distress.  HENT:  Head: Normocephalic and atraumatic.  Left Ear: External ear normal.  Mouth/Throat: Oropharynx is clear and moist.    Nasal congestion and turbinate swelling, there is ethmoid sinus tenderness and frontal and maxillary sinus tenderness bilaterally.  Eyes: Conjunctivae and EOM are normal. Pupils are equal, round, and reactive to light. Right eye exhibits no discharge. Left eye exhibits no discharge.  Neck: Normal range of motion. Neck supple. No thyromegaly present.  Cardiovascular: Normal rate and regular rhythm.   No murmur heard. Pulmonary/Chest: Effort normal. She has no wheezes. She has no rales.  Animal congestion with coughing and no rales or wheezes  Musculoskeletal: Normal range of motion.  Neurological: She is alert and oriented to person, place, and time.  Skin: Skin is dry.  There is some bruising on the left arm  Psychiatric: She has a normal mood and affect. Her behavior is normal. Judgment and thought content normal.  Vitals reviewed.  BP 139/73 mmHg  Pulse 75  Temp(Src) 100.1 F (37.8 C) (Oral)  Ht 5\' 5"  (1.651 m)  Wt 226 lb (102.513 kg)  BMI 37.61 kg/m2        Assessment & Plan:  1. Fever, unspecified fever cause -Take Tylenol for aches pains and fever and drink plenty of fluids - POCT CBC  2. Acute ethmoidal sinusitis, recurrence not specified -Take antibiotic as directed, drink plenty of fluids, use nasal saline, and take Mucinex maximum strength, blue and white in color, 1 twice daily for cough and congestion - cefdinir (OMNICEF) 300 MG capsule; Take 1 capsule (300 mg total) by mouth 2 (two) times daily.  Dispense: 20 capsule; Refill: 0  Meds ordered this encounter  Medications  . cefdinir (OMNICEF) 300 MG capsule    Sig: Take 1 capsule (300 mg total) by mouth 2 (two) times daily.    Dispense:  20 capsule    Refill:  0   Patient Instructions  Drink plenty of fluids Use nasal saline frequently through the day in each nostril Take Mucinex maximum strength 1 twice daily for cough and congestion with a large glass of water as directed on the paper given to you during the  visit Take the antibiotic as directed until completed Take Tylenol as needed for aches pains and fever   Nyra Capes MD

## 2014-08-19 ENCOUNTER — Telehealth: Payer: Self-pay | Admitting: Family

## 2014-08-19 ENCOUNTER — Other Ambulatory Visit: Payer: Self-pay | Admitting: *Deleted

## 2014-08-19 DIAGNOSIS — F411 Generalized anxiety disorder: Secondary | ICD-10-CM

## 2014-08-19 MED ORDER — ALPRAZOLAM 1 MG PO TABS: 1.0000 mg | ORAL_TABLET | Freq: Every evening | ORAL | Status: DC | PRN

## 2014-08-19 NOTE — Telephone Encounter (Signed)
Last fillled 07/18/14, last seen 07/02/14. Route to pool, nurse call to [facility] [phone]

## 2014-08-19 NOTE — Telephone Encounter (Signed)
Refill called to pharmacy VM 

## 2014-08-27 ENCOUNTER — Other Ambulatory Visit: Payer: Self-pay | Admitting: *Deleted

## 2014-08-27 MED ORDER — TRAMADOL HCL 50 MG PO TABS: 50.0000 mg | ORAL_TABLET | Freq: Four times a day (QID) | ORAL | Status: DC | PRN

## 2014-08-27 NOTE — Telephone Encounter (Signed)
No answer on patient phone.  Pain script is ready.

## 2014-08-27 NOTE — Telephone Encounter (Signed)
Last filled 07/26/14, last seen 08/04/14.

## 2014-09-11 ENCOUNTER — Ambulatory Visit: Payer: Medicare HMO | Admitting: Family Medicine

## 2014-09-15 ENCOUNTER — Other Ambulatory Visit (INDEPENDENT_AMBULATORY_CARE_PROVIDER_SITE_OTHER): Payer: Self-pay | Admitting: Internal Medicine

## 2014-10-13 ENCOUNTER — Other Ambulatory Visit: Payer: Self-pay | Admitting: Family

## 2014-10-14 NOTE — Telephone Encounter (Signed)
Last filled 09/01/14, last seen 07/02/14.

## 2014-10-14 NOTE — Telephone Encounter (Signed)
RX ready to pick up

## 2014-10-17 ENCOUNTER — Encounter (HOSPITAL_COMMUNITY): Payer: Medicare HMO | Attending: Hematology and Oncology

## 2014-10-17 DIAGNOSIS — D62 Acute posthemorrhagic anemia: Secondary | ICD-10-CM | POA: Diagnosis not present

## 2014-10-17 DIAGNOSIS — D696 Thrombocytopenia, unspecified: Secondary | ICD-10-CM

## 2014-10-17 DIAGNOSIS — E611 Iron deficiency: Secondary | ICD-10-CM | POA: Diagnosis not present

## 2014-10-17 DIAGNOSIS — K922 Gastrointestinal hemorrhage, unspecified: Secondary | ICD-10-CM | POA: Diagnosis not present

## 2014-10-17 LAB — IRON AND TIBC
IRON: 117 ug/dL (ref 28–170)
Saturation Ratios: 40 % — ABNORMAL HIGH (ref 10.4–31.8)
TIBC: 295 ug/dL (ref 250–450)
UIBC: 178 ug/dL

## 2014-10-17 LAB — CBC WITH DIFFERENTIAL/PLATELET
Basophils Absolute: 0 10*3/uL (ref 0.0–0.1)
Basophils Relative: 0 % (ref 0–1)
EOS ABS: 0.2 10*3/uL (ref 0.0–0.7)
Eosinophils Relative: 4 % (ref 0–5)
HCT: 30.8 % — ABNORMAL LOW (ref 36.0–46.0)
Hemoglobin: 10.6 g/dL — ABNORMAL LOW (ref 12.0–15.0)
LYMPHS ABS: 1.3 10*3/uL (ref 0.7–4.0)
LYMPHS PCT: 32 % (ref 12–46)
MCH: 31.6 pg (ref 26.0–34.0)
MCHC: 34.4 g/dL (ref 30.0–36.0)
MCV: 91.9 fL (ref 78.0–100.0)
Monocytes Absolute: 0.4 10*3/uL (ref 0.1–1.0)
Monocytes Relative: 10 % (ref 3–12)
NEUTROS PCT: 54 % (ref 43–77)
Neutro Abs: 2.2 10*3/uL (ref 1.7–7.7)
PLATELETS: 80 10*3/uL — AB (ref 150–400)
RBC: 3.35 MIL/uL — AB (ref 3.87–5.11)
RDW: 14 % (ref 11.5–15.5)
WBC: 4 10*3/uL (ref 4.0–10.5)

## 2014-10-17 LAB — FERRITIN: Ferritin: 90 ng/mL (ref 11–307)

## 2014-10-17 NOTE — Progress Notes (Signed)
Kathryn [name] presented for labwork. Labs per MD order drawn via Peripheral Line 23 gauge needle inserted in right antecubital.  Good blood return present. Procedure without incident.  Needle removed intact. Patient tolerated procedure well.

## 2014-10-20 ENCOUNTER — Encounter (HOSPITAL_BASED_OUTPATIENT_CLINIC_OR_DEPARTMENT_OTHER): Payer: Medicare HMO | Admitting: Oncology

## 2014-10-20 ENCOUNTER — Other Ambulatory Visit (HOSPITAL_COMMUNITY): Payer: Medicare HMO

## 2014-10-20 VITALS — BP 132/72 | HR 76 | Temp 98.6°F | Resp 20 | Wt 229.6 lb

## 2014-10-20 DIAGNOSIS — E611 Iron deficiency: Secondary | ICD-10-CM | POA: Diagnosis not present

## 2014-10-20 NOTE — Progress Notes (Signed)
Kathryn Heap, MD [address] [place] [address]  Iron deficiency - Plan: ferumoxytol [facility]) 510 mg in sodium chloride 0.9 % 100 mL IVPB, CBC with Differential, Iron and TIBC, Ferritin  CURRENT THERAPY: IV iron support  INTERVAL HISTORY: Kathryn [name] 59 y.o. female returns for followup of iron deficiency with intolerance to oral iron in the setting of pancytopenia with esophageal varices, hepatitis C with cirrhosis and splenomegaly (S/P Harvoni Tx in 09/2013), and Crohn's Disease S/P ileostomy.  I personally reviewed and went over laboratory results with the patient.  The results are noted within this dictation. She has a normocytic, normochromic anemia that is stable and a ferritin of 90 without a significant elevation in TIBC.  She also has an intermittent leukopenia with a stable thrombocytopenia.  She reports that she feels good.    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  She notes that her Gynecologist could not identify the fistula on her last exam.      Past Medical History  Diagnosis Date  . Hypertension   . Enteritis presumed infectious   . Crohn's   . Crohn's   . Anemia   . Hepatitis C antibody test positive   . Hepatitis   . Renal insufficiency   . Cirrhosis   . Splenomegaly   . Bilateral leg edema 01/2014    has Hypertension; Crohn's disease; Acute blood loss anemia; Hepatic cirrhosis; Hepatitis C antibody test positive; Hepatitis C reactive; GERD (gastroesophageal reflux disease); Peripheral neuropathy; Insomnia; GI bleed; Thrombocytopenia; UTI (urinary tract infection); Coagulopathy; Exertional dyspnea; Weakness; CKD (chronic kidney disease), stage III; Anemia of chronic disease; Iron deficiency; Community acquired pneumonia; CAP (community acquired pneumonia); Acute on chronic renal failure; Hyponatremia; and Hypokalemia on her problem list.     is allergic to penicillins.  Current  Outpatient Prescriptions on File Prior to Visit  Medication Sig Dispense Refill  . acetaminophen (RA ACETAMINOPHEN) 650 MG CR tablet Take 1,300 mg by mouth 2 (two) times daily as needed.    . ALPRAZolam (XANAX) 1 MG tablet Take 1 tablet (1 mg total) by mouth at bedtime as needed for sleep. 30 tablet 2  . Cholecalciferol (VITAMIN D-1000 MAX ST) 1000 UNITS tablet Take 1,000 Units by mouth daily.    . furosemide (LASIX) 20 MG tablet TAKE 1 TABLET BY MOUTH EVERY DAY 30 tablet 3  . gabapentin (NEURONTIN) 100 MG capsule TAKE 1 CAPSULE BY MOUTH THREE TIMES DAILY 270 capsule 0  . Multiple Vitamins-Minerals (MULTIVITAMIN WITH MINERALS) tablet Take 1 tablet by mouth daily.    . nadolol (CORGARD) 40 MG tablet TAKE 1 TABLET BY MOUTH EVERY DAY 30 tablet 3  . pantoprazole (PROTONIX) 40 MG tablet TAKE 1 TABLET BY MOUTH DAILY 30 tablet 3  . Probiotic Product (PROBIOTIC PO) Take 1 tablet by mouth daily.    Marland Kitchen spironolactone (ALDACTONE) 25 MG tablet Take 1 tablet (25 mg total) by mouth daily. 60 tablet 3  . traMADol (ULTRAM) 50 MG tablet TAKE 1 TABLET BY MOUTH EVERY SIX HOURS AS NEEDED FOR MODERATE PAIN. 60 tablet 0  . nystatin-triamcinolone (MYCOLOG II) cream Apply 1 application topically 2 (two) times daily. (Patient not taking: Reported on 10/20/2014) 30 g 0  . triamcinolone ointment (KENALOG) 0.5 % Apply 1 application topically 2 (two) times daily. (Patient not taking: Reported on 10/20/2014) 30 g 0   No current facility-administered medications on file prior to visit.  Past Surgical History  Procedure Laterality Date  . Colon surgery      MULTIPLE SURGERIES FOR CROHNS  . Ileostomy      multiple abdominal surgeries for Crohn's by Dr. Gabriel Cirri  . Cholecystectomy    . Abscess drainage      abdominal  . Esophagogastroduodenoscopy  10/05/2010    Procedure: ESOPHAGOGASTRODUODENOSCOPY (EGD);  Surgeon: [name], MD;  [facility]: [facility];  Service: Endoscopy;  Laterality: N/A;  7:30 am  . Cirrhosis     . Esophagogastroduodenoscopy N/A 09/11/2013    Procedure: ESOPHAGOGASTRODUODENOSCOPY (EGD);  Surgeon: [name], MD;  Location: [facility];  Service: Endoscopy;  Laterality: N/A;  . Ileoscopy  09/11/2013    Procedure: ILEOSCOPY THROUGH STOMA;  Surgeon: [name], MD;  Location: [facility];  Service: Endoscopy;;  . Givens capsule study N/A 09/12/2013    Procedure: GIVENS CAPSULE STUDY;  Surgeon: [name], MD;  Location: [facility];  Service: Endoscopy;  Laterality: N/A;  . Givens capsule study N/A 11/25/2013    Procedure: GIVENS CAPSULE STUDY;  Surgeon: [name], MD;  Location: [facility];  Service: Endoscopy;  Laterality: N/A;  . Varices cauterization Right     in the stoma of her ileostomy  . Tips procedure      [facility] in October    Denies any headaches, dizziness, double vision, fevers, chills, night sweats, nausea, vomiting, diarrhea, constipation, chest pain, heart palpitations, shortness of breath, blood in stool, black tarry stool, urinary pain, urinary burning, urinary frequency, hematuria.   PHYSICAL EXAMINATION  ECOG PERFORMANCE STATUS: 1 - Symptomatic but completely ambulatory  Filed Vitals:   10/20/14 1422  BP: 132/72  Pulse: 76  Temp: 98.6 F (37 C)  Resp: 20    GENERAL:alert, no distress, well nourished, well developed, comfortable, cooperative, obese and smiling SKIN: skin color, texture, turgor are normal, no rashes or significant lesions HEAD: Normocephalic, No masses, lesions, tenderness or abnormalities EYES: normal, PERRLA, EOMI, Conjunctiva are pink and non-injected EARS: External ears normal OROPHARYNX:lips, buccal mucosa, and tongue normal and mucous membranes are moist  NECK: supple, trachea midline LYMPH:  not examined BREAST:not examined LUNGS: clear to auscultation  HEART: regular rate & rhythm, no murmurs and no gallops ABDOMEN:obese BACK: Back symmetric, no curvature. EXTREMITIES:less then 2 second capillary refill,  no joint deformities, effusion, or inflammation, no skin discoloration  NEURO: alert & oriented x 3 with fluent speech, no focal motor/sensory deficits, gait normal    LABORATORY DATA: CBC    Component Value Date/Time   WBC 4.0 10/17/2014 1415   WBC 4.7 12/23/2013 1601   RBC 3.35* 10/17/2014 1415   RBC 3.38* 01/09/2014 1600   RBC 3.75* 12/23/2013 1601   HGB 10.6* 10/17/2014 1415   HCT 30.8* 10/17/2014 1415   PLT 80* 10/17/2014 1415   MCV 91.9 10/17/2014 1415   MCH 31.6 10/17/2014 1415   MCH 26.4* 12/23/2013 1601   MCHC 34.4 10/17/2014 1415   MCHC 32.5 12/23/2013 1601   RDW 14.0 10/17/2014 1415   RDW 16.3* 12/23/2013 1601   LYMPHSABS 1.3 10/17/2014 1415   LYMPHSABS 0.9 12/23/2013 1601   MONOABS 0.4 10/17/2014 1415   EOSABS 0.2 10/17/2014 1415   EOSABS 0.1 12/23/2013 1601   BASOSABS 0.0 10/17/2014 1415   BASOSABS 0.0 12/23/2013 1601      Chemistry      Component Value Date/Time   NA 135 07/17/2014 1328   NA 137 07/02/2014 1544   K  3.4* 07/17/2014 1328   CL 110 07/17/2014 1328   CO2 18* 07/17/2014 1328   BUN 23* 07/17/2014 1328   BUN 13 07/02/2014 1544   CREATININE 1.58* 07/17/2014 1328   CREATININE 1.30* 05/12/2014 1507      Component Value Date/Time   CALCIUM 8.2* 07/17/2014 1328   ALKPHOS 148* 07/17/2014 1328   AST 29 07/17/2014 1328   ALT 15 07/17/2014 1328   BILITOT 1.6* 07/17/2014 1328     Lab Results  Component Value Date   IRON 117 10/17/2014   TIBC 295 10/17/2014   FERRITIN 90 10/17/2014     RADIOGRAPHIC STUDIES:  No results found.   ASSESSMENT AND PLAN:  Iron deficiency Iron deficiency with intolerance to oral iron in the setting of pancytopenia with esophageal varices, hepatitis C with cirrhosis and splenomegaly (S/P Harvoni Tx in 09/2013), and Crohn's Disease S/P ileostomy.  Labs in 3 months: CBC diff, iron/TIBC, ferritin.  IV Feraheme 510 mg x 1 dose in the near future for a ferritin less than 100 in the setting of anemia with a  calculated iron deficit > 500 mg.  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  Return in 3 months for follow-up.   THERAPY PLAN:  Will continue to monitor labs and provide Iron support IV.  All questions were answered. The patient knows to call the clinic with any problems, questions or concerns. We can certainly see the patient much sooner if necessary.  Patient and plan discussed with Dr. Loma Messing and she is in agreement with the aforementioned.   This note is electronically signed by: Tina Griffiths 10/20/2014 4:26 PM

## 2014-10-20 NOTE — Patient Instructions (Signed)
[facility] at [facility] Discharge Instructions  RECOMMENDATIONS MADE BY THE CONSULTANT AND ANY TEST RESULTS WILL BE SENT TO YOUR REFERRING PHYSICIAN.  We will get you scheduled for 1 dose IV Feraheme. (We will need insurance approval prior to infusion.) Labs again in 3 months. MD appointment again in 3 months.  Thank you for choosing [facility] at [facility] to provide your oncology and hematology care.  To afford each patient quality time with our provider, please arrive at least 15 minutes before your scheduled appointment time.    You need to re-schedule your appointment should you arrive 10 or more minutes late.  We strive to give you quality time with our providers, and arriving late affects you and other patients whose appointments are after yours.  Also, if you no show three or more times for appointments you may be dismissed from the clinic at the providers discretion.     Again, thank you for choosing [facility].  Our hope is that these requests will decrease the amount of time that you wait before being seen by our physicians.       _____________________________________________________________  Should you have questions after your visit to [facility], please contact our office at [phone] between the hours of 8:30 a.m. and 4:30 p.m.  Voicemails left after 4:30 p.m. will not be returned until the following business day.  For prescription refill requests, have your pharmacy contact our office.

## 2014-10-20 NOTE — Assessment & Plan Note (Addendum)
Iron deficiency with intolerance to oral iron in the setting of pancytopenia with esophageal varices, hepatitis C with cirrhosis and splenomegaly (S/P Harvoni Tx in 09/2013), and Crohn's Disease S/P ileostomy.  Labs in 3 months: CBC diff, iron/TIBC, ferritin.  IV Feraheme 510 mg x 1 dose in the near future for a ferritin less than 100 in the setting of anemia with a calculated iron deficit > 500 mg.  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  Return in 3 months for follow-up.

## 2014-10-23 ENCOUNTER — Other Ambulatory Visit: Payer: Self-pay

## 2014-10-23 ENCOUNTER — Other Ambulatory Visit: Payer: Self-pay | Admitting: Family Medicine

## 2014-10-23 MED ORDER — GABAPENTIN 100 MG PO CAPS: 100.0000 mg | ORAL_CAPSULE | Freq: Three times a day (TID) | ORAL | Status: DC

## 2014-10-24 ENCOUNTER — Encounter (HOSPITAL_BASED_OUTPATIENT_CLINIC_OR_DEPARTMENT_OTHER): Payer: Medicare HMO

## 2014-10-24 VITALS — BP 118/61 | HR 58 | Temp 98.0°F | Resp 20

## 2014-10-24 DIAGNOSIS — E611 Iron deficiency: Secondary | ICD-10-CM

## 2014-10-24 MED ORDER — SODIUM CHLORIDE 0.9 % IV SOLN
INTRAVENOUS | Status: DC
  Administered 2014-10-24: 11:00:00 via INTRAVENOUS

## 2014-10-24 MED ORDER — SODIUM CHLORIDE 0.9 % IV SOLN
510.0000 mg | Freq: Once | INTRAVENOUS | Status: AC
  Administered 2014-10-24: 510 mg via INTRAVENOUS
  Filled 2014-10-24: qty 17

## 2014-10-24 NOTE — Patient Instructions (Signed)
[facility] at [facility] Discharge Instructions  RECOMMENDATIONS MADE BY THE CONSULTANT AND ANY TEST RESULTS WILL BE SENT TO YOUR REFERRING PHYSICIAN.  You received Feraheme today.    Ferumoxytol injection What is this medicine? FERUMOXYTOL is an iron complex. Iron is used to make healthy red blood cells, which carry oxygen and nutrients throughout the body. This medicine is used to treat iron deficiency anemia in people with chronic kidney disease. This medicine may be used for other purposes; ask your health care provider or pharmacist if you have questions. COMMON BRAND NAME(S): Feraheme  What should I tell my health care provider before I take this medicine? They need to know if you have any of these conditions: -anemia not caused by low iron levels -high levels of iron in the blood -magnetic resonance imaging (MRI) test scheduled -an unusual or allergic reaction to iron, other medicines, foods, dyes, or preservatives -pregnant or trying to get pregnant -breast-feeding  How should I use this medicine? This medicine is for injection into a vein. It is given by a health care professional in a hospital or clinic setting.  What if I miss a dose? It is important not to miss your dose. Call your doctor or health care professional if you are unable to keep an appointment.  What may interact with this medicine? This medicine may interact with the following medications: -other iron products This list may not describe all possible interactions. Give your health care provider a list of all the medicines, herbs, non-prescription drugs, or dietary supplements you use. Also tell them if you smoke, drink alcohol, or use illegal drugs. Some items may interact with your medicine.  What should I watch for while using this medicine? Visit your doctor or healthcare professional regularly. Tell your doctor or healthcare professional if your symptoms do not start to get better  or if they get worse. You may need blood work done while you are taking this medicine. You may need to follow a special diet. Talk to your doctor. Foods that contain iron include: whole grains/cereals, dried fruits, beans, or peas, leafy green vegetables, and organ meats (liver, kidney).  What side effects may I notice from receiving this medicine? Side effects that you should report to your doctor or health care professional as soon as possible: -allergic reactions like skin rash, itching or hives, swelling of the face, lips, or tongue -breathing problems -changes in blood pressure -feeling faint or lightheaded, falls -fever or chills -flushing, sweating, or hot feelings -swelling of the ankles or feet Side effects that usually do not require medical attention (Report these to your doctor or health care professional if they continue or are bothersome.): -diarrhea -headache -nausea, vomiting -stomach pain This list may not describe all possible side effects. Call your doctor for medical advice about side effects. You may report side effects to FDA at 1-800-FDA-1088.    Thank you for choosing  [facility] to provide your oncology and hematology care.  To afford each patient quality time with our provider, please arrive at least 15 minutes before your scheduled appointment time.    You need to re-schedule your appointment should you arrive 10 or more minutes late.  We strive to give you quality time with our providers, and arriving late affects you and other patients whose appointments are after yours.  Also, if you no show three or more times for appointments you may be dismissed from the clinic at  the providers discretion.     Again, thank you for choosing [facility].  Our hope is that these requests will decrease the amount of time that you wait before being seen by our physicians.        _____________________________________________________________  Should you have questions after your visit to [facility], please contact our office at [phone] between the hours of 8:30 a.m. and 4:30 p.m.  Voicemails left after 4:30 p.m. will not be returned until the following business day.  For prescription refill requests, have your pharmacy contact our office.

## 2014-10-24 NOTE — Progress Notes (Signed)
Kathryn [name] Tolerated iron infusion today Discharged ambulatory

## 2014-10-27 ENCOUNTER — Ambulatory Visit (HOSPITAL_COMMUNITY): Payer: Medicare HMO

## 2014-11-17 ENCOUNTER — Ambulatory Visit (INDEPENDENT_AMBULATORY_CARE_PROVIDER_SITE_OTHER): Payer: Medicare HMO | Admitting: Family Medicine

## 2014-11-17 ENCOUNTER — Encounter: Payer: Self-pay | Admitting: Family Medicine

## 2014-11-17 VITALS — BP 118/71 | HR 73 | Temp 101.3°F | Ht 65.0 in | Wt 230.8 lb

## 2014-11-17 DIAGNOSIS — N3001 Acute cystitis with hematuria: Secondary | ICD-10-CM

## 2014-11-17 DIAGNOSIS — R350 Frequency of micturition: Secondary | ICD-10-CM

## 2014-11-17 LAB — POCT URINALYSIS DIPSTICK
Bilirubin, UA: NEGATIVE
Glucose, UA: NEGATIVE
KETONES UA: NEGATIVE
Nitrite, UA: NEGATIVE
PROTEIN UA: NEGATIVE
SPEC GRAV UA: 1.015
Urobilinogen, UA: NEGATIVE
pH, UA: 6.5

## 2014-11-17 LAB — POCT UA - MICROSCOPIC ONLY
Bacteria, U Microscopic: NEGATIVE
CASTS, UR, LPF, POC: NEGATIVE
CRYSTALS, UR, HPF, POC: NEGATIVE
Mucus, UA: NEGATIVE
YEAST UA: NEGATIVE

## 2014-11-17 MED ORDER — SULFAMETHOXAZOLE-TRIMETHOPRIM 800-160 MG PO TABS: 1.0000 | ORAL_TABLET | Freq: Two times a day (BID) | ORAL | Status: DC

## 2014-11-17 NOTE — Progress Notes (Signed)
BP 118/71 mmHg  Pulse 73  Temp(Src) 101.3 F (38.5 C) (Oral)  Ht  (1.651 m)  Wt 230 lb 12.8 oz (104.69 kg)  BMI 38.41 kg/m2   Subjective:    Patient ID: Kathryn [name], female    DOB: 08-29-1955, 59 y.o.   MRN: [id number]  HPI: Kathryn [name] is a 59 y.o. female presenting on 11/17/2014 for Urinary Tract Infection; Right leg pain/soreness; and Fever   HPI Dysuria Patient has a 2 day history of general ill feeling and discomfort with urination. 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 She does feel fevers and this morning was up to 103. It is 101 in office today. She denies any flank pain. She denies any blood in her urine. She has been having more blood out of her rectal vaginal vault because of the fistula than she normally does.  Relevant past medical, surgical, family and social history reviewed and updated as indicated. Interim medical history since our last visit reviewed. Allergies and medications reviewed and updated.  Review of Systems  Constitutional: Positive for fever and chills.  HENT: Negative for congestion, ear discharge and ear pain.   Eyes: Negative for redness and visual disturbance.  Respiratory: Negative for chest tightness and shortness of breath.   Cardiovascular: Negative for chest pain and leg swelling.  Genitourinary: Positive for dysuria, urgency and frequency. Negative for hematuria, flank pain and difficulty urinating.  Musculoskeletal: Negative for back pain and gait problem.  Skin: Negative for rash.  Neurological: Negative for light-headedness and headaches.  Psychiatric/Behavioral: Negative for behavioral problems and agitation.  All other systems reviewed and are negative.   Per HPI unless specifically indicated above     Medication List       This list is accurate as of: 11/17/14  1:53 PM.  Always use your most recent med list.               ALPRAZolam 1 MG tablet  Commonly  known as:  XANAX  Take 1 tablet (1 mg total) by mouth at bedtime as needed for sleep.     furosemide 20 MG tablet  Commonly known as:  LASIX  TAKE 1 TABLET BY MOUTH EVERY DAY     gabapentin 100 MG capsule  Commonly known as:  NEURONTIN  Take 1 capsule (100 mg total) by mouth 3 (three) times daily.     multivitamin with minerals tablet  Take 1 tablet by mouth daily.     nadolol 40 MG tablet  Commonly known as:  CORGARD  TAKE 1 TABLET BY MOUTH EVERY DAY     nystatin-triamcinolone cream  Commonly known as:  MYCOLOG II  Apply 1 application topically 2 (two) times daily.     pantoprazole 40 MG tablet  Commonly known as:  PROTONIX  TAKE 1 TABLET BY MOUTH DAILY     PROBIOTIC PO  Take 1 tablet by mouth daily.     RA ACETAMINOPHEN 650 MG CR tablet  Generic drug:  acetaminophen  Take 1,300 mg by mouth 2 (two) times daily as needed.     spironolactone 25 MG tablet  Commonly known as:  ALDACTONE  Take 1 tablet (25 mg total) by mouth daily.     sulfamethoxazole-trimethoprim 800-160 MG tablet  Commonly known as:  BACTRIM DS  Take 1 tablet by mouth 2 (two) times daily.     traMADol 50 MG tablet  Commonly known as:  Janean Sark  TAKE 1 TABLET BY MOUTH EVERY SIX HOURS AS NEEDED FOR MODERATE PAIN.     triamcinolone ointment 0.5 %  Commonly known as:  KENALOG  Apply 1 application topically 2 (two) times daily.     VITAMIN D-1000 MAX ST 1000 UNITS tablet  Generic drug:  Cholecalciferol  Take 1,000 Units by mouth daily.           Objective:    BP 118/71 mmHg  Pulse 73  Temp(Src) 101.3 F (38.5 C) (Oral)  Ht 5\' 5"  (1.651 m)  Wt 230 lb 12.8 oz (104.69 kg)  BMI 38.41 kg/m2  Wt Readings from Last 3 Encounters:  11/17/14 230 lb 12.8 oz (104.69 kg)  10/20/14 229 lb 9.6 oz (104.146 kg)  08/04/14 226 lb (102.513 kg)    Physical Exam  Constitutional: She is oriented to person, place, and time. She appears well-developed and well-nourished. No distress.  Eyes: Conjunctivae and  EOM are normal. Pupils are equal, round, and reactive to light.  Cardiovascular: Normal rate, regular rhythm, normal heart sounds and intact distal pulses.   No murmur heard. Pulmonary/Chest: Effort normal and breath sounds normal. No respiratory distress. She has no wheezes.  Abdominal: Soft. Bowel sounds are normal. She exhibits no distension. There is no tenderness. There is no rebound and no guarding.  No abdominal pain on exam or flank pain. She does have ostomy back.  Musculoskeletal: Normal range of motion. She exhibits no edema or tenderness.  Neurological: She is alert and oriented to person, place, and time. Coordination normal.  Skin: Skin is warm and dry. No rash noted. She is not diaphoretic.  Psychiatric: She has a normal mood and affect. Her behavior is normal.  Vitals reviewed.   Results for orders placed or performed in visit on 11/17/14  POCT UA - Microscopic Only  Result Value Ref Range   WBC, Ur, HPF, POC 40-80    RBC, urine, microscopic 15-20    Bacteria, U Microscopic negative    Mucus, UA neg    Epithelial cells, urine per micros many    Crystals, Ur, HPF, POC neg    Casts, Ur, LPF, POC neg    Yeast, UA neg   POCT urinalysis dipstick  Result Value Ref Range   Color, UA gold    Clarity, UA cloudy    Glucose, UA neg    Bilirubin, UA neg    Ketones, UA neg    Spec Grav, UA 1.015    Blood, UA large    pH, UA 6.5    Protein, UA neg    Urobilinogen, UA negative    Nitrite, UA neg    Leukocytes, UA large (3+) (A) Negative      Assessment & Plan:   Problem List Items Addressed This Visit      Genitourinary   UTI (urinary tract infection)   Relevant Medications   sulfamethoxazole-trimethoprim (BACTRIM DS) 800-160 MG tablet   Other Relevant Orders   POCT UA - Microscopic Only (Completed)   POCT urinalysis dipstick (Completed)   Urine culture    Other Visit Diagnoses    Urinary frequency    -  Primary    Relevant Medications     sulfamethoxazole-trimethoprim (BACTRIM DS) 800-160 MG tablet    Other Relevant Orders    POCT UA - Microscopic Only (Completed)    POCT urinalysis dipstick (Completed)    Urine culture        Follow up plan: Return if symptoms worsen or fail to  improve.  Arville Care, MD [facility] 11/17/2014, 1:53 PM

## 2014-11-20 LAB — URINE CULTURE

## 2014-11-21 ENCOUNTER — Other Ambulatory Visit: Payer: Self-pay | Admitting: *Deleted

## 2014-11-21 ENCOUNTER — Telehealth: Payer: Self-pay | Admitting: Family Medicine

## 2014-11-21 DIAGNOSIS — F411 Generalized anxiety disorder: Secondary | ICD-10-CM

## 2014-11-21 MED ORDER — TRAZODONE HCL 50 MG PO TABS: 25.0000 mg | ORAL_TABLET | Freq: Every evening | ORAL | Status: DC | PRN

## 2014-11-21 NOTE — Telephone Encounter (Signed)
Trazodone 50 qhs can be called in if she likes - if not she needs to be seen for this by a provider. NA at home # -jhb 10/14

## 2014-11-21 NOTE — Telephone Encounter (Signed)
Please have the patient try trazodone 50 mg and see if this will help since it is less habit forming

## 2014-11-21 NOTE — Telephone Encounter (Signed)
Last filled 10/20/14, seen everyone here for sick visits only. Route to pool if approved

## 2014-11-24 MED ORDER — ALPRAZOLAM 1 MG PO TABS: 1.0000 mg | ORAL_TABLET | Freq: Every evening | ORAL | Status: DC | PRN

## 2014-11-24 NOTE — Telephone Encounter (Signed)
rx called into pharmacy

## 2014-11-24 NOTE — Telephone Encounter (Signed)
Pt needs chronic follow up visit, rx refilled

## 2014-11-25 ENCOUNTER — Ambulatory Visit (INDEPENDENT_AMBULATORY_CARE_PROVIDER_SITE_OTHER): Payer: Medicare HMO | Admitting: Family

## 2014-11-25 ENCOUNTER — Encounter: Payer: Self-pay | Admitting: Family

## 2014-11-25 ENCOUNTER — Other Ambulatory Visit: Payer: Self-pay | Admitting: Family Medicine

## 2014-11-25 VITALS — BP 103/60 | HR 68 | Temp 97.7°F | Ht 65.0 in | Wt 230.4 lb

## 2014-11-25 DIAGNOSIS — L03115 Cellulitis of right lower limb: Secondary | ICD-10-CM

## 2014-11-25 MED ORDER — CEPHALEXIN 750 MG PO CAPS: 750.0000 mg | ORAL_CAPSULE | Freq: Three times a day (TID) | ORAL | Status: DC

## 2014-11-25 NOTE — Patient Instructions (Signed)

## 2014-11-25 NOTE — Progress Notes (Signed)
   Subjective:    Patient ID: Kathryn [name], female    DOB: [birth year], 59 y.o.   MRN: [id number]  HPI Pt presents to the office today for cellulites of right lower leg. Pt  states she is having a 5 out 10 burning pain when she is walking and a 10 out 10 if she "bumps" her leg on anything. Pt was given 7 days of bactrim on 11/17/14 for a UTI and this cellulites. PT states it helps slightly, but continues to be painful and red.    Review of Systems  Constitutional: Negative.   HENT: Negative.   Eyes: Negative.   Respiratory: Negative.  Negative for shortness of breath.   Cardiovascular: Negative.  Negative for palpitations.  Gastrointestinal: Negative.   Endocrine: Negative.   Genitourinary: Negative.   Musculoskeletal: Negative.   Neurological: Negative.  Negative for headaches.  Hematological: Negative.   Psychiatric/Behavioral: Negative.   All other systems reviewed and are negative.      Objective:   Physical Exam  Constitutional: She is oriented to person, place, and time. She appears well-developed and well-nourished. No distress.  HENT:  Head: Normocephalic and atraumatic.  Eyes: Pupils are equal, round, and reactive to light.  Neck: Normal range of motion. Neck supple. No thyromegaly present.  Cardiovascular: Normal rate, regular rhythm, normal heart sounds and intact distal pulses.   No murmur heard. Pulmonary/Chest: Effort normal and breath sounds normal. No respiratory distress. She has no wheezes.  Abdominal: Soft. Bowel sounds are normal. She exhibits no distension. There is no tenderness.  Musculoskeletal: Normal range of motion. She exhibits edema (2+ edema in right lower leg). She exhibits no tenderness.  Neurological: She is alert and oriented to person, place, and time. She has normal reflexes. No cranial nerve deficit.  Skin: Skin is warm and dry. There is erythema.  Psychiatric: She has a normal mood and affect. Her behavior is normal. Judgment and thought  content normal.  Vitals reviewed.   BP 103/60 mmHg  Pulse 68  Temp(Src) 97.7 F (36.5 C) (Oral)  Ht 5\' 5"  (1.651 m)  Wt 230 lb 6.4 oz (104.509 kg)  BMI 38.34 kg/m2       Assessment & Plan:  1. Cellulitis of leg, right -Keep leg elevated -Rest -Compression -Warm compresses  -RTO in 10 days - cephALEXin (KEFLEX) 750 MG capsule; Take 1 capsule (750 mg total) by mouth 3 (three) times daily.  Dispense: 30 capsule; Refill: 0  [name], FNP

## 2014-12-17 ENCOUNTER — Ambulatory Visit (INDEPENDENT_AMBULATORY_CARE_PROVIDER_SITE_OTHER): Payer: Medicare HMO | Admitting: Family Medicine

## 2014-12-17 ENCOUNTER — Encounter: Payer: Self-pay | Admitting: Family Medicine

## 2014-12-17 VITALS — BP 113/75 | HR 66 | Temp 98.4°F | Ht 65.0 in | Wt 231.0 lb

## 2014-12-17 DIAGNOSIS — I872 Venous insufficiency (chronic) (peripheral): Secondary | ICD-10-CM | POA: Insufficient documentation

## 2014-12-17 DIAGNOSIS — I8311 Varicose veins of right lower extremity with inflammation: Secondary | ICD-10-CM

## 2014-12-17 MED ORDER — TRIAMCINOLONE ACETONIDE 0.1 % EX CREA: 1.0000 "application " | TOPICAL_CREAM | Freq: Two times a day (BID) | CUTANEOUS | Status: DC

## 2014-12-17 NOTE — Patient Instructions (Signed)
Great to meet you!  Keep an eye on the red spot, if it persists more than 1 month we could consider a biopsy  Try the steroid cream (triamcinolone cream) I have prescribed for leg irritation as it looks like you have venous stasis dermatitis  Please come back if you develop fevers, chills, sweats, or worsening symptoms of your legs.   Continue to keep compression on the leg and elevate as often as possible

## 2014-12-17 NOTE — Progress Notes (Signed)
   HPI  Patient presents today here for evaluation of leg swelling and irritation.  She explains that she's had some right leg swelling intermittently over the last several weeks. She was started on antibiotics for right lower extremity cellulitis. She is almost finished those antibiotics. She states she's had a lot of improvement in the leg and states that it was generally red which has improved. She has Compression on the leg and elevated at night. She denies fever, chills, sweats. She does have some decreased energy. She states that she has the small red area on her medial right leg that she was concerned about.  PMH: Smoking status noted ROS: Per HPI  Objective: BP 113/75 mmHg  Pulse 66  Temp(Src) 98.4 F (36.9 C) (Oral)  Ht 5\' 5"  (1.651 m)  Wt 231 lb (104.781 kg)  BMI 38.44 kg/m2 Gen: NAD, alert, cooperative with exam HEENT: NCAT Ext: No edema, warm Neuro: Alert and oriented, No gross deficits Skin: Hemosiderin staining of the right lower extremity with approximately 1+ pitting edema on the right, left edematous as well but not pitting, leg circumference is 46 cm bilaterally Scaling on the right lower leg Small erythematous flat area on the right medial leg measuring 1 cm x 1.5 cm, nontender to touch, no warmth, induration, or skin breakdown   Assessment and plan:  # Venous stasis dermatitis She is almost finished with her Keflex course, encouraged her to finish this. She does appear to have chronic venous stasis on the right lower extremity, given her triamcinolone for this if she develops irritation. She has a small red lesion on her right medial calf have asked her to watch, if this persists he could be considered for a biopsy to rule out vasculitis. Welcomed her to return if she develops worsening symptoms or if they persist.   Meds ordered this encounter  Medications  . triamcinolone cream (KENALOG) 0.1 %    Sig: Apply 1 application topically 2 (two) times daily.     Dispense:  30 g    Refill:  0    Murtis Sink, MD [facility] 12/17/2014, 4:25 PM

## 2014-12-25 ENCOUNTER — Other Ambulatory Visit: Payer: Self-pay | Admitting: Family

## 2014-12-26 ENCOUNTER — Telehealth: Payer: Self-pay | Admitting: Family

## 2014-12-26 NOTE — Telephone Encounter (Signed)
Patient aware that rx has been called into pharmacy. 

## 2014-12-26 NOTE — Telephone Encounter (Signed)
rx called into pharmacy

## 2014-12-26 NOTE — Telephone Encounter (Signed)
Last filled 11/24/14. Route to pool, call in at [facility] [phone]

## 2014-12-31 ENCOUNTER — Encounter (INDEPENDENT_AMBULATORY_CARE_PROVIDER_SITE_OTHER): Payer: Self-pay | Admitting: *Deleted

## 2014-12-31 ENCOUNTER — Ambulatory Visit (INDEPENDENT_AMBULATORY_CARE_PROVIDER_SITE_OTHER): Payer: Medicare HMO | Admitting: Internal Medicine

## 2014-12-31 ENCOUNTER — Encounter (INDEPENDENT_AMBULATORY_CARE_PROVIDER_SITE_OTHER): Payer: Self-pay | Admitting: Internal Medicine

## 2014-12-31 VITALS — BP 108/54 | HR 64 | Temp 97.7°F | Ht 65.5 in | Wt 223.0 lb

## 2014-12-31 DIAGNOSIS — K5000 Crohn's disease of small intestine without complications: Secondary | ICD-10-CM

## 2014-12-31 DIAGNOSIS — B1920 Unspecified viral hepatitis C without hepatic coma: Secondary | ICD-10-CM

## 2014-12-31 DIAGNOSIS — K7469 Other cirrhosis of liver: Secondary | ICD-10-CM

## 2014-12-31 LAB — HEPATIC FUNCTION PANEL
ALBUMIN: 2.8 g/dL — AB (ref 3.6–5.1)
ALK PHOS: 183 U/L — AB (ref 33–130)
ALT: 19 U/L (ref 6–29)
AST: 26 U/L (ref 10–35)
BILIRUBIN INDIRECT: 0.9 mg/dL (ref 0.2–1.2)
Bilirubin, Direct: 0.4 mg/dL — ABNORMAL HIGH (ref <=0.2)
TOTAL PROTEIN: 5.5 g/dL — AB (ref 6.1–8.1)
Total Bilirubin: 1.3 mg/dL — ABNORMAL HIGH (ref 0.2–1.2)

## 2014-12-31 NOTE — Patient Instructions (Signed)
Labs and US. OV in 6 months.  

## 2014-12-31 NOTE — Progress Notes (Signed)
Subjective:    Patient ID: Kathryn [name], female    DOB: 08-14-1955, 59 y.o.   MRN: [id number]  HPI Her for f/u. Hx of cirrhosis complicated by Hepatitis C which was successfully treated in 2015 with Harvoni. Hx of Crohn's disease. Hx of iron deficiency anemia and thrombocytopenia and is followed by Dr. [name].  Her last iron infusion was 4 months ago. She is due for f/u at the [facility] next month. She tells me her appetite is okay. She has lost 7 pounds since her last visit in May. She says sometimes she has rt lower back pain which occurs rarely. She empties her ileostomy bag 5-6 times a day. She denies BRRB or melena.  Denies periods of confusion.   06/10/2014 CT abdomen/pelvis with CM: abdominal pain:  IMPRESSION: 1. Left lower lobe pneumonia. 2. No evidence of bowel obstruction. Right lower quadrant ileostomy without complication. 3. TIPS shunt in place without ascites. Splenomegaly noted.    CBC    Component Value Date/Time   WBC 4.0 10/17/2014 1415   WBC 4.7 12/23/2013 1601   RBC 3.35* 10/17/2014 1415   RBC 3.38* 01/09/2014 1600   RBC 3.75* 12/23/2013 1601   HGB 10.6* 10/17/2014 1415   HCT 30.8* 10/17/2014 1415   PLT 80* 10/17/2014 1415   MCV 91.9 10/17/2014 1415   MCH 31.6 10/17/2014 1415   MCH 26.4* 12/23/2013 1601   MCHC 34.4 10/17/2014 1415   MCHC 32.5 12/23/2013 1601   RDW 14.0 10/17/2014 1415   RDW 16.3* 12/23/2013 1601   LYMPHSABS 1.3 10/17/2014 1415   LYMPHSABS 0.9 12/23/2013 1601   MONOABS 0.4 10/17/2014 1415   EOSABS 0.2 10/17/2014 1415   EOSABS 0.1 12/23/2013 1601   BASOSABS 0.0 10/17/2014 1415   BASOSABS 0.0 12/23/2013 1601      Review of Systems       Past Medical History  Diagnosis Date  . Hypertension   . Enteritis presumed infectious   . Crohn's   . Crohn's   . Anemia   . Hepatitis C antibody test positive   . Hepatitis   . Renal insufficiency   . Cirrhosis (HCC)   . Splenomegaly   . Bilateral leg edema 01/2014    Past  Surgical History  Procedure Laterality Date  . Colon surgery      MULTIPLE SURGERIES FOR CROHNS  . Ileostomy      multiple abdominal surgeries for Crohn's by Dr. Gabriel Cirri  . Cholecystectomy    . Abscess drainage      abdominal  . Esophagogastroduodenoscopy  10/05/2010    Procedure: ESOPHAGOGASTRODUODENOSCOPY (EGD);  Surgeon: [name], MD;  Location: [facility];  Service: Endoscopy;  Laterality: N/A;  7:30 am  . Cirrhosis    . Esophagogastroduodenoscopy N/A 09/11/2013    Procedure: ESOPHAGOGASTRODUODENOSCOPY (EGD);  Surgeon: [name], MD;  Location: [facility];  Service: Endoscopy;  Laterality: N/A;  . Ileoscopy  09/11/2013    Procedure: ILEOSCOPY THROUGH STOMA;  Surgeon: [name], MD;  Location: [facility];  Service: Endoscopy;;  . Givens capsule study N/A 09/12/2013    Procedure: GIVENS CAPSULE STUDY;  Surgeon: [name], MD;  Location: [facility];  Service: Endoscopy;  Laterality: N/A;  . Givens capsule study N/A 11/25/2013    Procedure: GIVENS CAPSULE STUDY;  Surgeon: [name], MD;  Location: [facility];  Service: Endoscopy;  Laterality: N/A;  . Varices cauterization Right     in the stoma  of her ileostomy  . Tips procedure      [facility] in October    Allergies  Allergen Reactions  . Penicillins Rash    Current Outpatient Prescriptions on File Prior to Visit  Medication Sig Dispense Refill  . acetaminophen (RA ACETAMINOPHEN) 650 MG CR tablet Take 1,300 mg by mouth 2 (two) times daily as needed.    . ALPRAZolam (XANAX) 1 MG tablet TAKE 1 TABLET BY MOUTH AT BEDTIME AS NEEDED 30 tablet 2  . Cholecalciferol (VITAMIN D-1000 MAX ST) 1000 UNITS tablet Take 1,000 Units by mouth daily.    . furosemide (LASIX) 20 MG tablet TAKE 1 TABLET BY MOUTH EVERY DAY 30 tablet 3  . gabapentin (NEURONTIN) 100 MG capsule Take 1 capsule (100 mg total) by mouth 3 (three) times daily. 270 capsule 0  . Multiple Vitamins-Minerals (MULTIVITAMIN WITH MINERALS) tablet  Take 1 tablet by mouth daily.    . nadolol (CORGARD) 40 MG tablet TAKE 1 TABLET BY MOUTH EVERY DAY 30 tablet 5  . pantoprazole (PROTONIX) 40 MG tablet TAKE 1 TABLET BY MOUTH DAILY 30 tablet 3  . Probiotic Product (PROBIOTIC PO) Take 1 tablet by mouth daily.    Marland Kitchen spironolactone (ALDACTONE) 25 MG tablet Take 1 tablet (25 mg total) by mouth daily. 60 tablet 3  . traMADol (ULTRAM) 50 MG tablet TAKE 1 TABLET BY MOUTH EVERY SIX HOURS AS NEEDED FOR MODERATE PAIN. 60 tablet 0  . triamcinolone cream (KENALOG) 0.1 % Apply 1 application topically 2 (two) times daily. 30 g 0  . traZODone (DESYREL) 50 MG tablet Take 0.5-1 tablets (25-50 mg total) by mouth at bedtime as needed for sleep. (Patient not taking: Reported on 12/31/2014) 30 tablet 1   No current facility-administered medications on file prior to visit.       Objective:   Physical Exam Blood pressure 108/54, pulse 64, temperature 97.7 F (36.5 C), height 5' 5.5" (1.664 m), weight 223 lb (101.152 kg).  Alert and oriented. Skin warm and dry. Oral mucosa is moist.   . Sclera anicteric, conjunctivae is pink. Thyroid not enlarged. No cervical lymphadenopathy. Lungs clear. Heart regular rate and rhythm.  Abdomen is soft. Bowel sounds are positive. No hepatomegaly. No abdominal masses felt. No tenderness.  No edema to lower extremities.         Assessment & Plan:  Crohn's disease. She seems to be doing well. Cirrhosis/ Hepatitis C. Needs surveillance US abdomen. An[facility]thrombocytopenia: [facility]  CBC, Hepatic profile, Sedrate, [name] RUQ.  OV in 6 months. #2. 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 She is status post TIPS in October 2015 without recurrence of bleeding.

## 2015-01-01 LAB — CBC WITH DIFFERENTIAL/PLATELET
BASOS ABS: 0 10*3/uL (ref 0.0–0.1)
Basophils Relative: 0 % (ref 0–1)
EOS ABS: 0.2 10*3/uL (ref 0.0–0.7)
Eosinophils Relative: 4 % (ref 0–5)
HEMATOCRIT: 35.5 % — AB (ref 36.0–46.0)
HEMOGLOBIN: 11.3 g/dL — AB (ref 12.0–15.0)
LYMPHS ABS: 1.2 10*3/uL (ref 0.7–4.0)
LYMPHS PCT: 24 % (ref 12–46)
MCH: 30.7 pg (ref 26.0–34.0)
MCHC: 31.8 g/dL (ref 30.0–36.0)
MCV: 96.5 fL (ref 78.0–100.0)
MONOS PCT: 9 % (ref 3–12)
MPV: 10.6 fL (ref 8.6–12.4)
Monocytes Absolute: 0.4 10*3/uL (ref 0.1–1.0)
NEUTROS ABS: 3.1 10*3/uL (ref 1.7–7.7)
NEUTROS PCT: 63 % (ref 43–77)
Platelets: 85 10*3/uL — ABNORMAL LOW (ref 150–400)
RBC: 3.68 MIL/uL — ABNORMAL LOW (ref 3.87–5.11)
RDW: 15.2 % (ref 11.5–15.5)
WBC: 4.9 10*3/uL (ref 4.0–10.5)

## 2015-01-01 LAB — SEDIMENTATION RATE: Sed Rate: 4 mm/hr (ref 0–30)

## 2015-01-06 ENCOUNTER — Ambulatory Visit (HOSPITAL_COMMUNITY)
Admission: RE | Admit: 2015-01-06 | Discharge: 2015-01-06 | Disposition: A | Discharge status: Home / Self Care | Payer: Medicare HMO | Source: Ambulatory Visit | Attending: Internal Medicine | Admitting: Internal Medicine

## 2015-01-06 DIAGNOSIS — K7460 Unspecified cirrhosis of liver: Secondary | ICD-10-CM | POA: Diagnosis not present

## 2015-01-06 DIAGNOSIS — Z9049 Acquired absence of other specified parts of digestive tract: Secondary | ICD-10-CM | POA: Insufficient documentation

## 2015-01-06 DIAGNOSIS — B1920 Unspecified viral hepatitis C without hepatic coma: Secondary | ICD-10-CM

## 2015-01-06 DIAGNOSIS — K5000 Crohn's disease of small intestine without complications: Secondary | ICD-10-CM | POA: Diagnosis present

## 2015-01-13 ENCOUNTER — Encounter: Payer: Self-pay | Admitting: Family

## 2015-01-13 ENCOUNTER — Ambulatory Visit (INDEPENDENT_AMBULATORY_CARE_PROVIDER_SITE_OTHER): Payer: Medicare HMO | Admitting: Family

## 2015-01-13 VITALS — BP 121/76 | HR 63 | Temp 97.3°F | Ht 65.5 in | Wt 229.0 lb

## 2015-01-13 DIAGNOSIS — R300 Dysuria: Secondary | ICD-10-CM

## 2015-01-13 DIAGNOSIS — N390 Urinary tract infection, site not specified: Secondary | ICD-10-CM | POA: Diagnosis not present

## 2015-01-13 LAB — POCT UA - MICROSCOPIC ONLY
CRYSTALS, UR, HPF, POC: NEGATIVE
Casts, Ur, LPF, POC: NEGATIVE
Mucus, UA: NEGATIVE
Yeast, UA: NEGATIVE

## 2015-01-13 LAB — POCT URINALYSIS DIPSTICK
BILIRUBIN UA: NEGATIVE
Glucose, UA: NEGATIVE
Ketones, UA: NEGATIVE
NITRITE UA: POSITIVE
PH UA: 6
Spec Grav, UA: 1.02
Urobilinogen, UA: NEGATIVE

## 2015-01-13 MED ORDER — TRAMADOL HCL 50 MG PO TABS: 50.0000 mg | ORAL_TABLET | Freq: Two times a day (BID) | ORAL | Status: DC | PRN

## 2015-01-13 MED ORDER — SULFAMETHOXAZOLE-TRIMETHOPRIM 800-160 MG PO TABS: 1.0000 | ORAL_TABLET | Freq: Two times a day (BID) | ORAL | Status: DC

## 2015-01-13 NOTE — Progress Notes (Signed)
   Subjective:    Patient ID: Kathryn [name], female    DOB: 08/07/1955, 59 y.o.   MRN: [id number]  Dysuria  This is a new problem. The current episode started in the past 7 days. The problem occurs every urination. The problem has been waxing and waning. The quality of the pain is described as aching. The pain is at a severity of 4/10. The pain is mild. There has been no fever. Associated symptoms include flank pain, frequency and urgency. Pertinent negatives include no discharge, hematuria, hesitancy, nausea or vomiting. The treatment provided mild relief.      Review of Systems  Constitutional: Negative.   HENT: Negative.   Eyes: Negative.   Respiratory: Negative.  Negative for shortness of breath.   Cardiovascular: Negative.  Negative for palpitations.  Gastrointestinal: Negative.  Negative for nausea and vomiting.  Endocrine: Negative.   Genitourinary: Positive for dysuria, urgency, frequency and flank pain. Negative for hesitancy and hematuria.  Musculoskeletal: Negative.   Neurological: Negative.  Negative for headaches.  Hematological: Negative.   Psychiatric/Behavioral: Negative.   All other systems reviewed and are negative.      Objective:   Physical Exam  Constitutional: She is oriented to person, place, and time. She appears well-developed and well-nourished. No distress.  HENT:  Head: Normocephalic and atraumatic.  Right Ear: External ear normal.  Left Ear: External ear normal.  Nose: Nose normal.  Mouth/Throat: Oropharynx is clear and moist.  Eyes: Pupils are equal, round, and reactive to light.  Neck: Normal range of motion. Neck supple. No thyromegaly present.  Cardiovascular: Normal rate, regular rhythm, normal heart sounds and intact distal pulses.   No murmur heard. Pulmonary/Chest: Effort normal and breath sounds normal. No respiratory distress. She has no wheezes.  Abdominal: Soft. Bowel sounds are normal. She exhibits no distension. There is no  tenderness.  Musculoskeletal: Normal range of motion. She exhibits no edema or tenderness.  Negative for CVA tenderness   Neurological: She is alert and oriented to person, place, and time. She has normal reflexes. No cranial nerve deficit.  Skin: Skin is warm and dry.  Psychiatric: She has a normal mood and affect. Her behavior is normal. Judgment and thought content normal.  Vitals reviewed.     BP 121/76 mmHg  Pulse 63  Temp(Src) 97.3 F (36.3 C) (Oral)  Ht 5' 5.5" (1.664 m)  Wt 229 lb (103.874 kg)  BMI 37.51 kg/m2     Assessment & Plan:  1. Dysuria - POCT urinalysis dipstick - POCT UA - Microscopic Only  2. Urinary tract infection, site not specified -Force fluids AZO over the counter X2 days RTO prn Culture pending - sulfamethoxazole-trimethoprim (BACTRIM DS) 800-160 MG tablet; Take 1 tablet by mouth 2 (two) times daily.  Dispense: 14 tablet; Refill: 0  [name], FNP

## 2015-01-13 NOTE — Addendum Note (Signed)
Addended by: [name] A on: 01/13/2015 06:34 PM   Modules accepted: Orders

## 2015-01-13 NOTE — Patient Instructions (Signed)

## 2015-01-18 NOTE — Progress Notes (Signed)
[name], FNP [address] [place] [address]  Iron deficiency - Plan: CBC with Differential, Iron and TIBC, Ferritin, CBC with Differential, Iron and TIBC, Ferritin  Preventative health care - Plan: Influenza vac split quadrivalent PF (FLUARIX) injection 0.5 mL  CURRENT THERAPY: IV iron support  Oncology Flowsheet 10/24/2014  ferumoxytol (FERAHEME) IV 510 mg    INTERVAL HISTORY: Kathryn [name] 59 y.o. female returns for followup of iron deficiency with intolerance to oral iron in the setting of pancytopenia with esophageal varices, hepatitis C with cirrhosis and splenomegaly (S/P Harvoni Tx in 09/2013), and Crohn's Disease S/P ileostomy.  I personally reviewed and went over laboratory results with the patient.  The results are noted within this dictation.   Her iron studies are pending at this time.  She notes that she has not had an influenza vaccine.  She is agreeable to have one today.  She notes recurrent UTIs.  This is managed by her primary care provider.     "What stage cirrhosis am I?"  She notes that GI has not provided her with this information.  She is provided elementary information regarding Child-Pugh staging.  I have reviewed her chart and she appears to have Child-Pugh Class A based upon her most recent labs.    She denies any blood loss.  Past Medical History  Diagnosis Date  . Hypertension   . Enteritis presumed infectious   . Crohn's   . Crohn's   . Anemia   . Hepatitis C antibody test positive   . Hepatitis   . Renal insufficiency   . Cirrhosis (HCC)   . Splenomegaly   . Bilateral leg edema 01/2014    has Hypertension; Crohn's disease (HCC); Acute blood loss anemia; Hepatic cirrhosis (HCC); Hepatitis C antibody test positive; Hepatitis C reactive; GERD (gastroesophageal reflux disease); Peripheral neuropathy (HCC); Insomnia; GI bleed; Thrombocytopenia (HCC); UTI (urinary tract infection); Coagulopathy (HCC); Exertional dyspnea;  Weakness; CKD (chronic kidney disease), stage III; Anemia of chronic disease; Iron deficiency; Community acquired pneumonia; CAP (community acquired pneumonia); Acute on chronic renal failure (HCC); Hyponatremia; Hypokalemia; and Venous stasis dermatitis on her problem list.     is allergic to penicillins.  Current Outpatient Prescriptions on File Prior to Visit  Medication Sig Dispense Refill  . acetaminophen (RA ACETAMINOPHEN) 650 MG CR tablet Take 1,300 mg by mouth 2 (two) times daily as needed.    . ALPRAZolam (XANAX) 1 MG tablet TAKE 1 TABLET BY MOUTH AT BEDTIME AS NEEDED 30 tablet 2  . Cholecalciferol (VITAMIN D-1000 MAX ST) 1000 UNITS tablet Take 1,000 Units by mouth daily.    . furosemide (LASIX) 20 MG tablet TAKE 1 TABLET BY MOUTH EVERY DAY 30 tablet 3  . gabapentin (NEURONTIN) 100 MG capsule Take 1 capsule (100 mg total) by mouth 3 (three) times daily. 270 capsule 0  . Multiple Vitamins-Minerals (MULTIVITAMIN WITH MINERALS) tablet Take 1 tablet by mouth daily.    . nadolol (CORGARD) 40 MG tablet TAKE 1 TABLET BY MOUTH EVERY DAY 30 tablet 5  . pantoprazole (PROTONIX) 40 MG tablet TAKE 1 TABLET BY MOUTH DAILY 30 tablet 3  . Probiotic Product (PROBIOTIC PO) Take 1 tablet by mouth daily.    Marland Kitchen spironolactone (ALDACTONE) 25 MG tablet Take 1 tablet (25 mg total) by mouth daily. 60 tablet 3  . sulfamethoxazole-trimethoprim (BACTRIM DS) 800-160 MG tablet Take 1 tablet by mouth 2 (two) times daily. 14 tablet 0  . traMADol (ULTRAM)  50 MG tablet Take 1 tablet (50 mg total) by mouth every 12 (twelve) hours as needed. 60 tablet 0  . traZODone (DESYREL) 50 MG tablet Take 0.5-1 tablets (25-50 mg total) by mouth at bedtime as needed for sleep. 30 tablet 1   No current facility-administered medications on file prior to visit.    Past Surgical History  Procedure Laterality Date  . Colon surgery      MULTIPLE SURGERIES FOR CROHNS  . Ileostomy      multiple abdominal surgeries for Crohn's by Dr.  Gabriel Cirri  . Cholecystectomy    . Abscess drainage      abdominal  . Esophagogastroduodenoscopy  10/05/2010    Procedure: ESOPHAGOGASTRODUODENOSCOPY (EGD);  Surgeon: [name], MD;  [facility];  Service: Endoscopy;  Laterality: N/A;  7:30 am  . Cirrhosis    . Esophagogastroduodenoscopy N/A 09/11/2013    Procedure: ESOPHAGOGASTRODUODENOSCOPY (EGD);  Surgeon: [name], MD;  Location: [facility];  Service: Endoscopy;  Laterality: N/A;  . Ileoscopy  09/11/2013    Procedure: ILEOSCOPY THROUGH STOMA;  Surgeon: [name], MD;  Location: [facility];  Service: Endoscopy;;  . Givens capsule study N/A 09/12/2013    Procedure: GIVENS CAPSULE STUDY;  Surgeon: [name], MD;  Location: [facility];  Service: Endoscopy;  Laterality: N/A;  . Givens capsule study N/A 11/25/2013    Procedure: GIVENS CAPSULE STUDY;  Surgeon: [name], MD;  Location: [facility];  Service: Endoscopy;  Laterality: N/A;  . Varices cauterization Right     in the stoma of her ileostomy  . Tips procedure      [facility] in October    Denies any headaches, dizziness, double vision, fevers, chills, night sweats, nausea, vomiting, diarrhea, constipation, chest pain, heart palpitations, shortness of breath, blood in stool, black tarry stool, urinary pain, urinary burning, urinary frequency, hematuria.   PHYSICAL EXAMINATION  ECOG PERFORMANCE STATUS: 1 - Symptomatic but completely ambulatory  Filed Vitals:   01/19/15 1329  BP: 120/51  Pulse: 75  Temp: 99.2 F (37.3 C)  Resp: 15    GENERAL:alert, no distress, well nourished, well developed, comfortable, cooperative, obese and smiling, unaccompanied SKIN: skin color, texture, turgor are normal, no rashes or significant lesions HEAD: Normocephalic, No masses, lesions, tenderness or abnormalities EYES: normal, PERRLA, EOMI, Conjunctiva are pink and non-injected EARS: External ears normal OROPHARYNX:lips, buccal mucosa, and tongue normal  and mucous membranes are moist  NECK: supple, trachea midline LYMPH:  not examined BREAST:not examined LUNGS: clear to auscultation  HEART: regular rate & rhythm, no murmurs and no gallops ABDOMEN:obese BACK: Back symmetric, no curvature. EXTREMITIES:less then 2 second capillary refill, no joint deformities, effusion, or inflammation, no skin discoloration  NEURO: alert & oriented x 3 with fluent speech, no focal motor/sensory deficits, gait normal    LABORATORY DATA: CBC    Component Value Date/Time   WBC 4.2 01/19/2015 1317   WBC 4.7 12/23/2013 1601   RBC 3.20* 01/19/2015 1317   RBC 3.38* 01/09/2014 1600   RBC 3.75* 12/23/2013 1601   HGB 10.1* 01/19/2015 1317   HCT 29.4* 01/19/2015 1317   PLT 84* 01/19/2015 1317   MCV 91.9 01/19/2015 1317   MCH 31.6 01/19/2015 1317   MCH 26.4* 12/23/2013 1601   MCHC 34.4 01/19/2015 1317   MCHC 32.5 12/23/2013 1601   RDW 14.5 01/19/2015 1317   RDW 16.3* 12/23/2013 1601   LYMPHSABS 1.3 01/19/2015 1317   LYMPHSABS 0.9 12/23/2013 1601  MONOABS 0.5 01/19/2015 1317   EOSABS 0.1 01/19/2015 1317   EOSABS 0.1 12/23/2013 1601   BASOSABS 0.0 01/19/2015 1317   BASOSABS 0.0 12/23/2013 1601      Chemistry      Component Value Date/Time   NA 135 07/17/2014 1328   NA 137 07/02/2014 1544   K 3.4* 07/17/2014 1328   CL 110 07/17/2014 1328   CO2 18* 07/17/2014 1328   BUN 23* 07/17/2014 1328   BUN 13 07/02/2014 1544   CREATININE 1.58* 07/17/2014 1328   CREATININE 1.30* 05/12/2014 1507      Component Value Date/Time   CALCIUM 8.2* 07/17/2014 1328   ALKPHOS 183* 12/31/2014 1020   AST 26 12/31/2014 1020   ALT 19 12/31/2014 1020   BILITOT 1.3* 12/31/2014 1020     Lab Results  Component Value Date   IRON 117 10/17/2014   TIBC 295 10/17/2014   FERRITIN 90 10/17/2014     RADIOGRAPHIC STUDIES:  US Abdomen Limited Ruq  01/06/2015  CLINICAL DATA:  HCV.TIPS.  Cholecystectomy. EXAM: US ABDOMEN LIMITED - RIGHT UPPER QUADRANT COMPARISON:  CT  06/10/2014. FINDINGS: Gallbladder: Cholecystectomy. Common bile duct: Diameter: 5 mm. Liver: Heterogeneous hepatic echotexture with nodular contour consistent with cirrhosis.TIPS patent. IMPRESSION: 1. Heterogeneous hepatic echotexture with nodular contour consistent with cirrhosis. No focal hepatic abnormality. 2. TIPS patent. 3. Cholecystectomy.  No biliary distention. Electronically Signed   By: Maisie Fus  Register   On: 01/06/2015 11:07     ASSESSMENT AND PLAN:  Iron deficiency Iron deficiency with intolerance to oral iron in the setting of pancytopenia with esophageal varices, hepatitis C with cirrhosis and splenomegaly (S/P Harvoni Tx in 09/2013), and Crohn's Disease S/P ileostomy.  Labs today: CBC diff, iron/TIBC, ferritin.  Labs in 3 and 6 months: CBC diff, iron/TIBC, ferritin  Influenza vaccine today.  Return in 6 months for follow-up.   THERAPY PLAN:  Will continue to monitor labs and provide Iron support IV.  All questions were answered. The patient knows to call the clinic with any problems, questions or concerns. We can certainly see the patient much sooner if necessary.  Patient and plan discussed with Dr. Loma Messing and she is in agreement with the aforementioned.   This note is electronically signed by: Tina Griffiths 01/19/2015 6:06 PM

## 2015-01-18 NOTE — Assessment & Plan Note (Addendum)
Iron deficiency with intolerance to oral iron in the setting of pancytopenia with esophageal varices, hepatitis C with cirrhosis and splenomegaly (S/P Harvoni Tx in 09/2013), and Crohn's Disease S/P ileostomy.  Labs today: CBC diff, iron/TIBC, ferritin.  Labs in 3 and 6 months: CBC diff, iron/TIBC, ferritin  Influenza vaccine today.  Return in 6 months for follow-up.

## 2015-01-19 ENCOUNTER — Encounter (HOSPITAL_COMMUNITY): Payer: Medicare HMO | Attending: Hematology and Oncology | Admitting: Oncology

## 2015-01-19 ENCOUNTER — Encounter (HOSPITAL_COMMUNITY): Payer: Medicare HMO

## 2015-01-19 ENCOUNTER — Encounter (HOSPITAL_COMMUNITY): Payer: Self-pay | Admitting: Oncology

## 2015-01-19 VITALS — BP 120/51 | HR 75 | Temp 99.2°F | Resp 15 | Wt 227.6 lb

## 2015-01-19 DIAGNOSIS — Z23 Encounter for immunization: Secondary | ICD-10-CM | POA: Diagnosis not present

## 2015-01-19 DIAGNOSIS — D61818 Other pancytopenia: Secondary | ICD-10-CM

## 2015-01-19 DIAGNOSIS — E611 Iron deficiency: Secondary | ICD-10-CM

## 2015-01-19 DIAGNOSIS — D62 Acute posthemorrhagic anemia: Secondary | ICD-10-CM | POA: Insufficient documentation

## 2015-01-19 DIAGNOSIS — R161 Splenomegaly, not elsewhere classified: Secondary | ICD-10-CM | POA: Diagnosis not present

## 2015-01-19 DIAGNOSIS — K5090 Crohn's disease, unspecified, without complications: Secondary | ICD-10-CM | POA: Diagnosis not present

## 2015-01-19 DIAGNOSIS — K922 Gastrointestinal hemorrhage, unspecified: Secondary | ICD-10-CM | POA: Diagnosis not present

## 2015-01-19 DIAGNOSIS — Z0000 Encounter for general adult medical examination without abnormal findings: Secondary | ICD-10-CM

## 2015-01-19 LAB — CBC WITH DIFFERENTIAL/PLATELET
Basophils Absolute: 0 10*3/uL (ref 0.0–0.1)
Basophils Relative: 0 %
EOS ABS: 0.1 10*3/uL (ref 0.0–0.7)
Eosinophils Relative: 3 %
HEMATOCRIT: 29.4 % — AB (ref 36.0–46.0)
HEMOGLOBIN: 10.1 g/dL — AB (ref 12.0–15.0)
LYMPHS ABS: 1.3 10*3/uL (ref 0.7–4.0)
LYMPHS PCT: 30 %
MCH: 31.6 pg (ref 26.0–34.0)
MCHC: 34.4 g/dL (ref 30.0–36.0)
MCV: 91.9 fL (ref 78.0–100.0)
MONOS PCT: 13 %
Monocytes Absolute: 0.5 10*3/uL (ref 0.1–1.0)
NEUTROS ABS: 2.3 10*3/uL (ref 1.7–7.7)
NEUTROS PCT: 54 %
Platelets: 84 10*3/uL — ABNORMAL LOW (ref 150–400)
RBC: 3.2 MIL/uL — AB (ref 3.87–5.11)
RDW: 14.5 % (ref 11.5–15.5)
WBC: 4.2 10*3/uL (ref 4.0–10.5)

## 2015-01-19 LAB — IRON AND TIBC
IRON: 163 ug/dL (ref 28–170)
Saturation Ratios: 65 % — ABNORMAL HIGH (ref 10.4–31.8)
TIBC: 251 ug/dL (ref 250–450)
UIBC: 88 ug/dL

## 2015-01-19 LAB — FERRITIN: FERRITIN: 183 ng/mL (ref 11–307)

## 2015-01-19 MED ORDER — INFLUENZA VAC SPLIT QUAD 0.5 ML IM SUSY
0.5000 mL | PREFILLED_SYRINGE | Freq: Once | INTRAMUSCULAR | Status: AC
  Administered 2015-01-19: 0.5 mL via INTRAMUSCULAR
  Filled 2015-01-19: qty 0.5

## 2015-01-19 NOTE — Progress Notes (Signed)
Kathryn [name] presents today for injection per MD orders. Flu vaccine administered IM in left Upper Arm. Administration without incident. Patient tolerated well.

## 2015-01-19 NOTE — Patient Instructions (Signed)
[facility] at [facility] Discharge Instructions  RECOMMENDATIONS MADE BY THE CONSULTANT AND ANY TEST RESULTS WILL BE SENT TO YOUR REFERRING PHYSICIAN.  Exam and discussion by Dellis Anes, PA-C Will give you a flu vaccine today If any concerns with your blood work, we will contact you.   Call with any questions.  Labs every 3 months and office visit in 6 months.  Thank you for choosing [facility] at [facility] to provide your oncology and hematology care.  To afford each patient quality time with our provider, please arrive at least 15 minutes before your scheduled appointment time.    You need to re-schedule your appointment should you arrive 10 or more minutes late.  We strive to give you quality time with our providers, and arriving late affects you and other patients whose appointments are after yours.  Also, if you no show three or more times for appointments you may be dismissed from the clinic at the providers discretion.     Again, thank you for choosing [facility]  Our hope is that these requests will decrease the amount of time that you wait before being seen by our physicians.       _____________________________________________________________  Should you have questions after your visit to [facility], please contact our office at [phone] between the hours of 8:30 a.m. and 4:30 p.m.  Voicemails left after 4:30 p.m. will not be returned until the following business day.  For prescription refill requests, have your pharmacy contact our office.

## 2015-01-28 ENCOUNTER — Other Ambulatory Visit: Payer: Self-pay | Admitting: *Deleted

## 2015-01-28 NOTE — Telephone Encounter (Signed)
Last filled 12/26/14, last seen 01/13/15. Route to pool, call in at [facility]

## 2015-01-29 MED ORDER — ALPRAZOLAM 1 MG PO TABS: 1.0000 mg | ORAL_TABLET | Freq: Every evening | ORAL | Status: DC | PRN

## 2015-02-02 ENCOUNTER — Other Ambulatory Visit (INDEPENDENT_AMBULATORY_CARE_PROVIDER_SITE_OTHER): Payer: Self-pay | Admitting: Internal Medicine

## 2015-02-23 ENCOUNTER — Ambulatory Visit (INDEPENDENT_AMBULATORY_CARE_PROVIDER_SITE_OTHER): Payer: Medicare HMO | Admitting: Family Medicine

## 2015-02-23 VITALS — BP 120/63 | HR 94 | Temp 97.6°F | Ht 65.5 in | Wt 229.0 lb

## 2015-02-23 DIAGNOSIS — N390 Urinary tract infection, site not specified: Secondary | ICD-10-CM | POA: Diagnosis not present

## 2015-02-23 DIAGNOSIS — R300 Dysuria: Secondary | ICD-10-CM | POA: Diagnosis not present

## 2015-02-23 LAB — POCT UA - MICROSCOPIC ONLY
CASTS, UR, LPF, POC: NEGATIVE
Crystals, Ur, HPF, POC: NEGATIVE
Mucus, UA: NEGATIVE
YEAST UA: NEGATIVE

## 2015-02-23 LAB — POCT URINALYSIS DIPSTICK
Bilirubin, UA: NEGATIVE
Glucose, UA: NEGATIVE
KETONES UA: NEGATIVE
Nitrite, UA: NEGATIVE
SPEC GRAV UA: 1.01
UROBILINOGEN UA: NEGATIVE
pH, UA: 5

## 2015-02-23 MED ORDER — NITROFURANTOIN MONOHYD MACRO 100 MG PO CAPS: 100.0000 mg | ORAL_CAPSULE | Freq: Two times a day (BID) | ORAL | Status: DC

## 2015-02-23 NOTE — Progress Notes (Signed)
Subjective:  Patient ID: Kathryn [name], female    DOB: [birth year]  Age: 60 y.o. MRN: [id number]  CC: Back Pain and Dysuria   HPI ALLEGRA CERNIGLIA presents for burning with urination and frequency for several days. Denies fever . No flank pain. No nausea, vomiting.   History Danelia has a past medical history of Hypertension; Enteritis presumed infectious; Crohn's; Crohn's; Anemia; Hepatitis C antibody test positive; Hepatitis; Renal insufficiency; Cirrhosis (HCC); Splenomegaly; and Bilateral leg edema (01/2014).   She has past surgical history that includes Colon surgery; Ileostomy; Cholecystectomy; Abscess drainage; Esophagogastroduodenoscopy (10/05/2010); Cirrhosis; Esophagogastroduodenoscopy (N/A, 09/11/2013); Ileoscopy (09/11/2013); Givens capsule study (N/A, 09/12/2013); Givens capsule study (N/A, 11/25/2013); varices cauterization (Right); and TIPS procedure.   Her family history includes Cancer in her mother; Stroke in her father.She reports that she has never smoked. She has never used smokeless tobacco. She reports that she does not drink alcohol or use illicit drugs.  Outpatient Prescriptions Prior to Visit  Medication Sig Dispense Refill  . acetaminophen (RA ACETAMINOPHEN) 650 MG CR tablet Take 1,300 mg by mouth 2 (two) times daily as needed.    . ALPRAZolam (XANAX) 1 MG tablet Take 1 tablet (1 mg total) by mouth at bedtime as needed. 30 tablet 2  . Cholecalciferol (VITAMIN D-1000 MAX ST) 1000 UNITS tablet Take 1,000 Units by mouth daily.    . furosemide (LASIX) 20 MG tablet TAKE 1 TABLET BY MOUTH EVERY DAY 30 tablet 3  . gabapentin (NEURONTIN) 100 MG capsule Take 1 capsule (100 mg total) by mouth 3 (three) times daily. 270 capsule 0  . Multiple Vitamins-Minerals (MULTIVITAMIN WITH MINERALS) tablet Take 1 tablet by mouth daily.    . nadolol (CORGARD) 40 MG tablet TAKE 1 TABLET BY MOUTH EVERY DAY 30 tablet 5  . pantoprazole (PROTONIX) 40 MG tablet TAKE 1 TABLET BY MOUTH DAILY 30 tablet 3    . Probiotic Product (PROBIOTIC PO) Take 1 tablet by mouth daily.    Marland Kitchen spironolactone (ALDACTONE) 25 MG tablet Take 1 tablet (25 mg total) by mouth daily. 60 tablet 3  . traMADol (ULTRAM) 50 MG tablet Take 1 tablet (50 mg total) by mouth every 12 (twelve) hours as needed. 60 tablet 0  . traZODone (DESYREL) 50 MG tablet Take 0.5-1 tablets (25-50 mg total) by mouth at bedtime as needed for sleep. 30 tablet 1  . sulfamethoxazole-trimethoprim (BACTRIM DS) 800-160 MG tablet Take 1 tablet by mouth 2 (two) times daily. (Patient not taking: Reported on 02/23/2015) 14 tablet 0   No facility-administered medications prior to visit.    ROS Review of Systems  Constitutional: Negative for fever, chills and diaphoresis.  HENT: Negative for congestion.   Eyes: Negative for visual disturbance.  Respiratory: Negative for cough and shortness of breath.   Cardiovascular: Negative for chest pain and palpitations.  Gastrointestinal: Negative for nausea, diarrhea and constipation.  Genitourinary: Positive for dysuria, urgency and frequency. Negative for hematuria, flank pain, decreased urine volume, menstrual problem and pelvic pain.  Musculoskeletal: Negative for joint swelling and arthralgias.  Skin: Negative for rash.  Neurological: Negative for dizziness and numbness.    Objective:  BP 120/63 mmHg  Pulse 94  Temp(Src) 97.6 F (36.4 C) (Oral)  Ht 5' 5.5" (1.664 m)  Wt 229 lb (103.874 kg)  BMI 37.51 kg/m2  BP Readings from Last 3 Encounters:  02/27/15 103/70  02/23/15 120/63  01/19/15 120/51    Wt Readings from Last 3 Encounters:  02/27/15 231 lb 9.6 oz (105.053 kg)  02/23/15 229 lb (103.874 kg)  01/19/15 227 lb 9.6 oz (103.239 kg)     Physical Exam  Constitutional: She is oriented to person, place, and time. She appears well-developed and well-nourished.  HENT:  Head: Normocephalic and atraumatic.  Cardiovascular: Normal rate and regular rhythm.   No murmur heard. Pulmonary/Chest:  Effort normal and breath sounds normal.  Abdominal: Soft. Bowel sounds are normal. She exhibits no mass. There is no tenderness. There is no rebound and no guarding.  Musculoskeletal: She exhibits no tenderness.  Neurological: She is alert and oriented to person, place, and time.  Skin: Skin is warm and dry.  Psychiatric: She has a normal mood and affect. Her behavior is normal.     Lab Results  Component Value Date   WBC 4.2 01/19/2015   HGB 10.1* 01/19/2015   HCT 29.4* 01/19/2015   PLT 84* 01/19/2015   GLUCOSE 190* 07/17/2014   ALT 19 12/31/2014   AST 26 12/31/2014   NA 135 07/17/2014   K 3.4* 07/17/2014   CL 110 07/17/2014   CREATININE 1.58* 07/17/2014   BUN 23* 07/17/2014   CO2 18* 07/17/2014   TSH 1.160 07/04/2013   INR 1.26 11/27/2013   HGBA1C 4.9% 02/13/2014    US Abdomen Limited Ruq  01/06/2015  CLINICAL DATA:  HCV.TIPS.  Cholecystectomy. EXAM: US ABDOMEN LIMITED - RIGHT UPPER QUADRANT COMPARISON:  CT 06/10/2014. FINDINGS: Gallbladder: Cholecystectomy. Common bile duct: Diameter: 5 mm. Liver: Heterogeneous hepatic echotexture with nodular contour consistent with cirrhosis.TIPS patent. IMPRESSION: 1. Heterogeneous hepatic echotexture with nodular contour consistent with cirrhosis. No focal hepatic abnormality. 2. TIPS patent. 3. Cholecystectomy.  No biliary distention. Electronically Signed   By: Maisie Fus  Register   On: 01/06/2015 11:07    Assessment & Plan:   Taniko was seen today for back pain and dysuria.  Diagnoses and all orders for this visit:  Dysuria -     POCT urinalysis dipstick -     POCT UA - Microscopic Only -     Urine culture  Urinary tract infection, site not specified  Other orders -     Discontinue: nitrofurantoin, macrocrystal-monohydrate, (MACROBID) 100 MG capsule; Take 1 capsule (100 mg total) by mouth 2 (two) times daily.   I have discontinued Ms. Wesche's sulfamethoxazole-trimethoprim. I am also having her maintain her multivitamin with  minerals, acetaminophen, Cholecalciferol, pantoprazole, Probiotic Product (PROBIOTIC PO), spironolactone, gabapentin, traZODone, nadolol, traMADol, ALPRAZolam, and furosemide.  Meds ordered this encounter  Medications  . DISCONTD: nitrofurantoin, macrocrystal-monohydrate, (MACROBID) 100 MG capsule    Sig: Take 1 capsule (100 mg total) by mouth 2 (two) times daily.    Dispense:  14 capsule    Refill:  0     Follow-up: Return if symptoms worsen or fail to improve.  Mechele Claude, M.D.

## 2015-02-25 ENCOUNTER — Telehealth: Payer: Self-pay | Admitting: *Deleted

## 2015-02-25 LAB — URINE CULTURE

## 2015-02-25 NOTE — Telephone Encounter (Signed)
Stp and she states she woke up this morning feeling generally weak and achy, fever of 101, SOB and vomiting. Pt was seen yesterday and given macrobid for UTI. Offered appt for afterhours clinic but pt states she doesn't feel good and doesn't want to come in if she doesn't need to. Please advise?

## 2015-02-26 NOTE — Telephone Encounter (Signed)
She really does need to come in. Thanks, WS.

## 2015-02-27 ENCOUNTER — Other Ambulatory Visit: Payer: Self-pay | Admitting: Family Medicine

## 2015-02-27 ENCOUNTER — Encounter: Payer: Self-pay | Admitting: Family Medicine

## 2015-02-27 ENCOUNTER — Ambulatory Visit (INDEPENDENT_AMBULATORY_CARE_PROVIDER_SITE_OTHER): Payer: Medicare HMO | Admitting: Family Medicine

## 2015-02-27 VITALS — BP 103/70 | HR 83 | Temp 98.1°F | Ht 65.5 in | Wt 231.6 lb

## 2015-02-27 DIAGNOSIS — N390 Urinary tract infection, site not specified: Secondary | ICD-10-CM | POA: Diagnosis not present

## 2015-02-27 MED ORDER — SULFAMETHOXAZOLE-TRIMETHOPRIM 800-160 MG PO TABS: 1.0000 | ORAL_TABLET | Freq: Two times a day (BID) | ORAL | Status: DC

## 2015-02-27 NOTE — Progress Notes (Signed)
Subjective:  Patient ID: Kathryn [name], female    DOB: Sep 25, 1955  Age: 60 y.o. MRN: [id number]  CC: Medication Reaction   HPI LILLIEN [name] presents for fever of 102 last night. Not sleeping. Antibiotic doesn't agree with her  History Lilie has a past medical history of Hypertension; Enteritis presumed infectious; Crohn's; Crohn's; Anemia; Hepatitis C antibody test positive; Hepatitis; Renal insufficiency; Cirrhosis (HCC); Splenomegaly; and Bilateral leg edema (01/2014).   She has past surgical history that includes Colon surgery; Ileostomy; Cholecystectomy; Abscess drainage; Esophagogastroduodenoscopy (10/05/2010); Cirrhosis; Esophagogastroduodenoscopy (N/A, 09/11/2013); Ileoscopy (09/11/2013); Givens capsule study (N/A, 09/12/2013); Givens capsule study (N/A, 11/25/2013); varices cauterization (Right); and TIPS procedure.   Her family history includes Cancer in her mother; Stroke in her father.She reports that she has never smoked. She has never used smokeless tobacco. She reports that she does not drink alcohol or use illicit drugs.  Outpatient Prescriptions Prior to Visit  Medication Sig Dispense Refill  . acetaminophen (RA ACETAMINOPHEN) 650 MG CR tablet Take 1,300 mg by mouth 2 (two) times daily as needed.    . ALPRAZolam (XANAX) 1 MG tablet Take 1 tablet (1 mg total) by mouth at bedtime as needed. 30 tablet 2  . Cholecalciferol (VITAMIN D-1000 MAX ST) 1000 UNITS tablet Take 1,000 Units by mouth daily.    . furosemide (LASIX) 20 MG tablet TAKE 1 TABLET BY MOUTH EVERY DAY 30 tablet 3  . gabapentin (NEURONTIN) 100 MG capsule Take 1 capsule (100 mg total) by mouth 3 (three) times daily. 270 capsule 0  . Multiple Vitamins-Minerals (MULTIVITAMIN WITH MINERALS) tablet Take 1 tablet by mouth daily.    . nadolol (CORGARD) 40 MG tablet TAKE 1 TABLET BY MOUTH EVERY DAY 30 tablet 5  . pantoprazole (PROTONIX) 40 MG tablet TAKE 1 TABLET BY MOUTH DAILY 30 tablet 3  . Probiotic Product (PROBIOTIC PO)  Take 1 tablet by mouth daily.    Marland Kitchen spironolactone (ALDACTONE) 25 MG tablet Take 1 tablet (25 mg total) by mouth daily. 60 tablet 3  . traMADol (ULTRAM) 50 MG tablet Take 1 tablet (50 mg total) by mouth every 12 (twelve) hours as needed. 60 tablet 0  . traZODone (DESYREL) 50 MG tablet Take 0.5-1 tablets (25-50 mg total) by mouth at bedtime as needed for sleep. 30 tablet 1  . nitrofurantoin, macrocrystal-monohydrate, (MACROBID) 100 MG capsule Take 1 capsule (100 mg total) by mouth 2 (two) times daily. 14 capsule 0  . sulfamethoxazole-trimethoprim (BACTRIM DS,SEPTRA DS) 800-160 MG tablet Take 1 tablet by mouth 2 (two) times daily. 14 tablet 0   No facility-administered medications prior to visit.    ROS Review of Systems  Constitutional: Positive for fever. Negative for chills and diaphoresis.  HENT: Negative for congestion.   Eyes: Negative for visual disturbance.  Respiratory: Negative for cough and shortness of breath.   Cardiovascular: Negative for chest pain and palpitations.  Gastrointestinal: Negative for nausea, diarrhea and constipation.  Genitourinary: Positive for dysuria, urgency and frequency. Negative for hematuria, flank pain, decreased urine volume, menstrual problem and pelvic pain.  Musculoskeletal: Negative for joint swelling and arthralgias.  Skin: Negative for rash.  Neurological: Negative for dizziness and numbness.    Objective:  BP 103/70 mmHg  Pulse 83  Temp(Src) 98.1 F (36.7 C) (Oral)  Ht 5' 5.5" (1.664 m)  Wt 231 lb 9.6 oz (105.053 kg)  BMI 37.94 kg/m2  SpO2 100%  BP Readings from Last 3 Encounters:  02/27/15 103/70  02/23/15 120/63  01/19/15 120/51  Wt Readings from Last 3 Encounters:  02/27/15 231 lb 9.6 oz (105.053 kg)  02/23/15 229 lb (103.874 kg)  01/19/15 227 lb 9.6 oz (103.239 kg)     Physical Exam  Constitutional: She is oriented to person, place, and time. She appears well-developed and well-nourished.  HENT:  Head: Normocephalic  and atraumatic.  Cardiovascular: Normal rate and regular rhythm.   No murmur heard. Pulmonary/Chest: Effort normal and breath sounds normal.  Abdominal: Soft. Bowel sounds are normal. She exhibits no mass. There is no tenderness. There is no rebound and no guarding.  Musculoskeletal: She exhibits no tenderness.  Neurological: She is alert and oriented to person, place, and time.  Skin: Skin is warm and dry.  Psychiatric: She has a normal mood and affect. Her behavior is normal.     Lab Results  Component Value Date   WBC 4.2 01/19/2015   HGB 10.1* 01/19/2015   HCT 29.4* 01/19/2015   PLT 84* 01/19/2015   GLUCOSE 190* 07/17/2014   ALT 19 12/31/2014   AST 26 12/31/2014   NA 135 07/17/2014   K 3.4* 07/17/2014   CL 110 07/17/2014   CREATININE 1.58* 07/17/2014   BUN 23* 07/17/2014   CO2 18* 07/17/2014   TSH 1.160 07/04/2013   INR 1.26 11/27/2013   HGBA1C 4.9% 02/13/2014    US Abdomen Limited Ruq  01/06/2015  CLINICAL DATA:  HCV.TIPS.  Cholecystectomy. EXAM: US ABDOMEN LIMITED - RIGHT UPPER QUADRANT COMPARISON:  CT 06/10/2014. FINDINGS: Gallbladder: Cholecystectomy. Common bile duct: Diameter: 5 mm. Liver: Heterogeneous hepatic echotexture with nodular contour consistent with cirrhosis.TIPS patent. IMPRESSION: 1. Heterogeneous hepatic echotexture with nodular contour consistent with cirrhosis. No focal hepatic abnormality. 2. TIPS patent. 3. Cholecystectomy.  No biliary distention. Electronically Signed   By: Maisie Fus  Register   On: 01/06/2015 11:07    Assessment & Plan:   Estalee was seen today for medication reaction.  Diagnoses and all orders for this visit:  Urinary tract infection, site not specified  I have discontinued Ms. Damaso's sulfamethoxazole-trimethoprim. I am also having her maintain her multivitamin with minerals, acetaminophen, Cholecalciferol, pantoprazole, Probiotic Product (PROBIOTIC PO), spironolactone, gabapentin, traZODone, nadolol, traMADol, ALPRAZolam, and  furosemide.  No orders of the defined types were placed in this encounter.     Follow-up: Return if symptoms worsen or fail to improve.  Mechele Claude, M.D.

## 2015-02-27 NOTE — Telephone Encounter (Signed)
appt made

## 2015-03-01 ENCOUNTER — Encounter: Payer: Self-pay | Admitting: Family Medicine

## 2015-03-09 ENCOUNTER — Telehealth (INDEPENDENT_AMBULATORY_CARE_PROVIDER_SITE_OTHER): Payer: Self-pay | Admitting: Internal Medicine

## 2015-03-09 NOTE — Telephone Encounter (Signed)
Kathryn [name] left a message on the office voice mail saying she has pain going from the middle of her back to her stomach area. She has no appetite, no energy, and hasn't been vomiting. She'd like a phone call regarding this.   Pt's ph# [phone]  Thank you.

## 2015-03-09 NOTE — Telephone Encounter (Signed)
No answer at home. Will call later. 

## 2015-03-10 ENCOUNTER — Inpatient Hospital Stay (HOSPITAL_COMMUNITY)
Admission: EM | Admit: 2015-03-10 | Discharge: 2015-03-12 | DRG: 394 | Disposition: A | Discharge status: Home / Self Care | Payer: Medicare HMO | Attending: Internal Medicine | Admitting: Internal Medicine

## 2015-03-10 ENCOUNTER — Emergency Department (HOSPITAL_COMMUNITY): Payer: Medicare HMO

## 2015-03-10 ENCOUNTER — Telehealth (INDEPENDENT_AMBULATORY_CARE_PROVIDER_SITE_OTHER): Payer: Self-pay | Admitting: Internal Medicine

## 2015-03-10 ENCOUNTER — Encounter (HOSPITAL_COMMUNITY): Payer: Self-pay | Admitting: Emergency Medicine

## 2015-03-10 DIAGNOSIS — K219 Gastro-esophageal reflux disease without esophagitis: Secondary | ICD-10-CM | POA: Diagnosis present

## 2015-03-10 DIAGNOSIS — I959 Hypotension, unspecified: Secondary | ICD-10-CM

## 2015-03-10 DIAGNOSIS — K50919 Crohn's disease, unspecified, with unspecified complications: Secondary | ICD-10-CM

## 2015-03-10 DIAGNOSIS — G629 Polyneuropathy, unspecified: Secondary | ICD-10-CM

## 2015-03-10 DIAGNOSIS — L97911 Non-pressure chronic ulcer of unspecified part of right lower leg limited to breakdown of skin: Secondary | ICD-10-CM

## 2015-03-10 DIAGNOSIS — K5090 Crohn's disease, unspecified, without complications: Secondary | ICD-10-CM | POA: Diagnosis present

## 2015-03-10 DIAGNOSIS — K7460 Unspecified cirrhosis of liver: Secondary | ICD-10-CM | POA: Diagnosis present

## 2015-03-10 DIAGNOSIS — N183 Chronic kidney disease, stage 3 unspecified: Secondary | ICD-10-CM | POA: Diagnosis present

## 2015-03-10 DIAGNOSIS — Z823 Family history of stroke: Secondary | ICD-10-CM | POA: Diagnosis not present

## 2015-03-10 DIAGNOSIS — Z809 Family history of malignant neoplasm, unspecified: Secondary | ICD-10-CM | POA: Diagnosis not present

## 2015-03-10 DIAGNOSIS — E611 Iron deficiency: Secondary | ICD-10-CM

## 2015-03-10 DIAGNOSIS — D61818 Other pancytopenia: Secondary | ICD-10-CM | POA: Diagnosis present

## 2015-03-10 DIAGNOSIS — Z6836 Body mass index (BMI) 36.0-36.9, adult: Secondary | ICD-10-CM | POA: Diagnosis not present

## 2015-03-10 DIAGNOSIS — Z9049 Acquired absence of other specified parts of digestive tract: Secondary | ICD-10-CM | POA: Diagnosis not present

## 2015-03-10 DIAGNOSIS — I129 Hypertensive chronic kidney disease with stage 1 through stage 4 chronic kidney disease, or unspecified chronic kidney disease: Secondary | ICD-10-CM | POA: Diagnosis present

## 2015-03-10 DIAGNOSIS — R109 Unspecified abdominal pain: Secondary | ICD-10-CM | POA: Diagnosis not present

## 2015-03-10 DIAGNOSIS — G6289 Other specified polyneuropathies: Secondary | ICD-10-CM

## 2015-03-10 DIAGNOSIS — E440 Moderate protein-calorie malnutrition: Secondary | ICD-10-CM | POA: Diagnosis present

## 2015-03-10 DIAGNOSIS — N189 Chronic kidney disease, unspecified: Secondary | ICD-10-CM

## 2015-03-10 DIAGNOSIS — B1920 Unspecified viral hepatitis C without hepatic coma: Secondary | ICD-10-CM | POA: Diagnosis present

## 2015-03-10 DIAGNOSIS — N179 Acute kidney failure, unspecified: Secondary | ICD-10-CM | POA: Diagnosis present

## 2015-03-10 DIAGNOSIS — K430 Incisional hernia with obstruction, without gangrene: Secondary | ICD-10-CM | POA: Diagnosis present

## 2015-03-10 DIAGNOSIS — K5669 Other intestinal obstruction: Secondary | ICD-10-CM | POA: Diagnosis not present

## 2015-03-10 DIAGNOSIS — D696 Thrombocytopenia, unspecified: Secondary | ICD-10-CM

## 2015-03-10 DIAGNOSIS — I10 Essential (primary) hypertension: Secondary | ICD-10-CM | POA: Diagnosis present

## 2015-03-10 DIAGNOSIS — Z932 Ileostomy status: Secondary | ICD-10-CM

## 2015-03-10 DIAGNOSIS — K7469 Other cirrhosis of liver: Secondary | ICD-10-CM

## 2015-03-10 DIAGNOSIS — K56600 Partial intestinal obstruction, unspecified as to cause: Secondary | ICD-10-CM | POA: Diagnosis present

## 2015-03-10 HISTORY — DX: Dorsalgia, unspecified: M54.9

## 2015-03-10 HISTORY — DX: Other chronic pain: G89.29

## 2015-03-10 LAB — COMPREHENSIVE METABOLIC PANEL
ALBUMIN: 2.9 g/dL — AB (ref 3.5–5.0)
ALT: 26 U/L (ref 14–54)
AST: 34 U/L (ref 15–41)
Alkaline Phosphatase: 183 U/L — ABNORMAL HIGH (ref 38–126)
Anion gap: 6 (ref 5–15)
BUN: 39 mg/dL — AB (ref 6–20)
CHLORIDE: 108 mmol/L (ref 101–111)
CO2: 17 mmol/L — AB (ref 22–32)
CREATININE: 2.73 mg/dL — AB (ref 0.44–1.00)
Calcium: 8.6 mg/dL — ABNORMAL LOW (ref 8.9–10.3)
GFR calc non Af Amer: 18 mL/min — ABNORMAL LOW (ref >60)
GFR, EST AFRICAN AMERICAN: 21 mL/min — AB (ref >60)
Glucose, Bld: 120 mg/dL — ABNORMAL HIGH (ref 65–99)
POTASSIUM: 3.5 mmol/L (ref 3.5–5.1)
SODIUM: 131 mmol/L — AB (ref 135–145)
Total Bilirubin: 1.5 mg/dL — ABNORMAL HIGH (ref 0.3–1.2)
Total Protein: 5.5 g/dL — ABNORMAL LOW (ref 6.5–8.1)

## 2015-03-10 LAB — CBC
HCT: 30.1 % — ABNORMAL LOW (ref 36.0–46.0)
HEMOGLOBIN: 10.2 g/dL — AB (ref 12.0–15.0)
MCH: 30.5 pg (ref 26.0–34.0)
MCHC: 33.9 g/dL (ref 30.0–36.0)
MCV: 90.1 fL (ref 78.0–100.0)
Platelets: 97 10*3/uL — ABNORMAL LOW (ref 150–400)
RBC: 3.34 MIL/uL — AB (ref 3.87–5.11)
RDW: 15.8 % — ABNORMAL HIGH (ref 11.5–15.5)
WBC: 7.9 10*3/uL (ref 4.0–10.5)

## 2015-03-10 LAB — CBC WITH DIFFERENTIAL/PLATELET
BASOS ABS: 0 10*3/uL (ref 0.0–0.1)
BASOS PCT: 0 %
EOS ABS: 0.1 10*3/uL (ref 0.0–0.7)
EOS PCT: 2 %
HEMATOCRIT: 34.1 % — AB (ref 36.0–46.0)
Hemoglobin: 11.5 g/dL — ABNORMAL LOW (ref 12.0–15.0)
Lymphocytes Relative: 27 %
Lymphs Abs: 2.2 10*3/uL (ref 0.7–4.0)
MCH: 30.2 pg (ref 26.0–34.0)
MCHC: 33.7 g/dL (ref 30.0–36.0)
MCV: 89.5 fL (ref 78.0–100.0)
MONO ABS: 0.7 10*3/uL (ref 0.1–1.0)
Monocytes Relative: 9 %
NEUTROS ABS: 5.1 10*3/uL (ref 1.7–7.7)
Neutrophils Relative %: 62 %
PLATELETS: 113 10*3/uL — AB (ref 150–400)
RBC: 3.81 MIL/uL — ABNORMAL LOW (ref 3.87–5.11)
RDW: 16 % — AB (ref 11.5–15.5)
WBC: 8.2 10*3/uL (ref 4.0–10.5)

## 2015-03-10 LAB — URINALYSIS, ROUTINE W REFLEX MICROSCOPIC
BILIRUBIN URINE: NEGATIVE
Glucose, UA: NEGATIVE mg/dL
KETONES UR: NEGATIVE mg/dL
NITRITE: NEGATIVE
PROTEIN: NEGATIVE mg/dL
Specific Gravity, Urine: 1.02 (ref 1.005–1.030)
pH: 6 (ref 5.0–8.0)

## 2015-03-10 LAB — MRSA PCR SCREENING: MRSA by PCR: NEGATIVE

## 2015-03-10 LAB — LIPASE, BLOOD: LIPASE: 98 U/L — AB (ref 11–51)

## 2015-03-10 LAB — LACTIC ACID, PLASMA: LACTIC ACID, VENOUS: 0.6 mmol/L (ref 0.5–2.0)

## 2015-03-10 LAB — URINE MICROSCOPIC-ADD ON

## 2015-03-10 LAB — SEDIMENTATION RATE: Sed Rate: 2 mm/hr (ref 0–22)

## 2015-03-10 MED ORDER — ONDANSETRON HCL 4 MG/2ML IJ SOLN: 4.0000 mg | Freq: Four times a day (QID) | INTRAMUSCULAR | Status: DC | PRN

## 2015-03-10 MED ORDER — SODIUM CHLORIDE 0.9 % IV SOLN
INTRAVENOUS | Status: DC
  Administered 2015-03-10: 18:00:00 via INTRAVENOUS

## 2015-03-10 MED ORDER — ONDANSETRON HCL 4 MG PO TABS: 4.0000 mg | ORAL_TABLET | Freq: Four times a day (QID) | ORAL | Status: DC | PRN

## 2015-03-10 MED ORDER — ALBUMIN HUMAN 25 % IV SOLN
12.5000 g | Freq: Once | INTRAVENOUS | Status: AC
  Administered 2015-03-11: 12.5 g via INTRAVENOUS
  Filled 2015-03-10: qty 50

## 2015-03-10 MED ORDER — ALPRAZOLAM 1 MG PO TABS
1.0000 mg | ORAL_TABLET | Freq: Every evening | ORAL | Status: DC | PRN
  Administered 2015-03-11: 1 mg via ORAL
  Filled 2015-03-10 (×2): qty 1

## 2015-03-10 MED ORDER — DIATRIZOATE MEGLUMINE & SODIUM 66-10 % PO SOLN
ORAL | Status: AC
  Filled 2015-03-10: qty 30

## 2015-03-10 MED ORDER — ACETAMINOPHEN 325 MG PO TABS: 650.0000 mg | ORAL_TABLET | Freq: Four times a day (QID) | ORAL | Status: DC | PRN

## 2015-03-10 MED ORDER — PANTOPRAZOLE SODIUM 40 MG IV SOLR
40.0000 mg | INTRAVENOUS | Status: DC
  Administered 2015-03-10: 40 mg via INTRAVENOUS
  Filled 2015-03-10: qty 40

## 2015-03-10 MED ORDER — HEPARIN SODIUM (PORCINE) 5000 UNIT/ML IJ SOLN: 5000.0000 [IU] | Freq: Three times a day (TID) | INTRAMUSCULAR | Status: DC

## 2015-03-10 MED ORDER — ALBUMIN HUMAN 5 % IV SOLN
12.5000 g | Freq: Once | INTRAVENOUS | Status: DC
  Filled 2015-03-10: qty 250

## 2015-03-10 MED ORDER — SODIUM CHLORIDE 0.9 % IV BOLUS (SEPSIS)
1000.0000 mL | Freq: Once | INTRAVENOUS | Status: AC
  Administered 2015-03-10: 1000 mL via INTRAVENOUS

## 2015-03-10 MED ORDER — MORPHINE SULFATE (PF) 2 MG/ML IV SOLN
2.0000 mg | INTRAVENOUS | Status: DC | PRN
  Administered 2015-03-10 – 2015-03-11 (×2): 2 mg via INTRAVENOUS
  Filled 2015-03-10 (×2): qty 1

## 2015-03-10 MED ORDER — ONDANSETRON HCL 4 MG/2ML IJ SOLN
4.0000 mg | INTRAMUSCULAR | Status: DC | PRN
  Administered 2015-03-10: 4 mg via INTRAVENOUS
  Filled 2015-03-10: qty 2

## 2015-03-10 MED ORDER — ALBUMIN HUMAN 25 % IV SOLN
INTRAVENOUS | Status: AC
  Filled 2015-03-10: qty 50

## 2015-03-10 MED ORDER — MORPHINE SULFATE (PF) 4 MG/ML IV SOLN
4.0000 mg | INTRAVENOUS | Status: DC | PRN
  Administered 2015-03-10: 4 mg via INTRAVENOUS
  Filled 2015-03-10: qty 1

## 2015-03-10 MED ORDER — SODIUM CHLORIDE 0.9 % IV BOLUS (SEPSIS)
500.0000 mL | Freq: Once | INTRAVENOUS | Status: AC
  Administered 2015-03-10: 500 mL via INTRAVENOUS

## 2015-03-10 MED ORDER — ACETAMINOPHEN 650 MG RE SUPP: 650.0000 mg | Freq: Four times a day (QID) | RECTAL | Status: DC | PRN

## 2015-03-10 MED ORDER — POLYETHYLENE GLYCOL 3350 17 G PO PACK
17.0000 g | PACK | Freq: Every day | ORAL | Status: DC
  Administered 2015-03-11 – 2015-03-12 (×2): 17 g via ORAL
  Filled 2015-03-10 (×2): qty 1

## 2015-03-10 MED ORDER — SODIUM CHLORIDE 0.9 % IV SOLN
INTRAVENOUS | Status: DC
  Administered 2015-03-10 (×2): via INTRAVENOUS

## 2015-03-10 NOTE — ED Provider Notes (Signed)
CSN: [id number]     Arrival date & time 03/10/15  1227 History   First MD Initiated Contact with Patient 03/10/15 1239     Chief Complaint  Patient presents with  . Abdominal Pain  . Back Pain  . Nausea      HPI Pt was seen at 1250.  Per pt, c/o gradual onset and persistence of constant left sided abd "pain" for the past 1 to 2 weeks.  Has been associated with nausea.  Describes the abd pain as "cramping," with radiation into her back "and then all over my abd."  Denies vomiting/diarrhea, no fevers, no rash, no CP/SOB, no black or blood in stools. Pt states she was tx for UTI by her PMD 2 weeks ago "and the antibiotic made me sick," so her antibiotic was changed to bactrim. Pt states she has taken the full course with improvement in dysuria and fevers.        Past Medical History  Diagnosis Date  . Hypertension   . Enteritis presumed infectious   . Crohn's   . Anemia   . Hepatitis C antibody test positive   . Hepatitis   . Renal insufficiency   . Cirrhosis (HCC)   . Splenomegaly   . Bilateral leg edema 01/2014  . Chronic back pain    Past Surgical History  Procedure Laterality Date  . Colon surgery      MULTIPLE SURGERIES FOR CROHNS  . Ileostomy      multiple abdominal surgeries for Crohn's by Dr. Gabriel Cirri  . Cholecystectomy    . Abscess drainage      abdominal  . Esophagogastroduodenoscopy  10/05/2010    Procedure: ESOPHAGOGASTRODUODENOSCOPY (EGD);  Surgeon: [name], MD;  Location: [facility];  Service: Endoscopy;  Laterality: N/A;  7:30 am  . Cirrhosis    . Esophagogastroduodenoscopy N/A 09/11/2013    Procedure: ESOPHAGOGASTRODUODENOSCOPY (EGD);  Surgeon: [name], MD;  Location: [facility];  Service: Endoscopy;  Laterality: N/A;  . Ileoscopy  09/11/2013    Procedure: ILEOSCOPY THROUGH STOMA;  Surgeon: [name], MD;  Location: [facility];  Service: Endoscopy;;  . Givens capsule study N/A 09/12/2013    Procedure: GIVENS CAPSULE STUDY;  Surgeon:  [name], MD;  Location: [facility];  Service: Endoscopy;  Laterality: N/A;  . Givens capsule study N/A 11/25/2013    Procedure: GIVENS CAPSULE STUDY;  Surgeon: [name], MD;  Location: [facility];  Service: Endoscopy;  Laterality: N/A;  . Varices cauterization Right     in the stoma of her ileostomy  . Tips procedure  11/2013    [facility]    Family History  Problem Relation Age of Onset  . Cancer Mother   . Stroke Father    Social History  Substance Use Topics  . Smoking status: Never Smoker   . Smokeless tobacco: Never Used  . Alcohol Use: No    Review of Systems ROS: Statement: All systems negative except as marked or noted in the HPI; Constitutional: Negative for fever and chills. ; ; Eyes: Negative for eye pain, redness and discharge. ; ; ENMT: Negative for ear pain, hoarseness, nasal congestion, sinus pressure and sore throat. ; ; Cardiovascular: Negative for chest pain, palpitations, diaphoresis, dyspnea and peripheral edema. ; ; Respiratory: Negative for cough, wheezing and stridor. ; ; Gastrointestinal: +nausea, abd pain. Negative for vomiting, diarrhea, blood in stool, hematemesis, jaundice and rectal bleeding. . ; ; Genitourinary: Negative for dysuria,  flank pain and hematuria. ; ; GYN:  No pelvic pain, no vaginal bleeding, no vaginal discharge, no vulvar pain. ;; Musculoskeletal: +back pain. Negative for neck pain. Negative for swelling and trauma.; ; Skin: Negative for pruritus, rash, abrasions, blisters, bruising and skin lesion.; ; Neuro: Negative for headache, lightheadedness and neck stiffness. Negative for weakness, altered level of consciousness , altered mental status, extremity weakness, paresthesias, involuntary movement, seizure and syncope.      Allergies  Penicillins  Home Medications   Prior to Admission medications   Medication Sig Start Date End Date Taking? Authorizing Provider  ALPRAZolam Prudy Feeler) 1 MG tablet Take 1 tablet (1 mg total) by  mouth at bedtime as needed. Patient taking differently: Take 1 mg by mouth at bedtime.  01/29/15  Yes Junie Spencer, FNP  furosemide (LASIX) 20 MG tablet TAKE 1 TABLET BY MOUTH EVERY DAY 02/03/15  Yes Len Blalock, NP  gabapentin (NEURONTIN) 100 MG capsule Take 1 capsule (100 mg total) by mouth 3 (three) times daily. 10/23/14  Yes Ernestina Penna, MD  spironolactone (ALDACTONE) 25 MG tablet Take 1 tablet (25 mg total) by mouth daily. 06/12/14  Yes Gwenyth Bender, NP  acetaminophen (RA ACETAMINOPHEN) 650 MG CR tablet Take 1,300 mg by mouth 2 (two) times daily as needed.    Historical Provider, MD  Cholecalciferol (VITAMIN D-1000 MAX ST) 1000 UNITS tablet Take 1,000 Units by mouth daily.    Historical Provider, MD  Multiple Vitamins-Minerals (MULTIVITAMIN WITH MINERALS) tablet Take 1 tablet by mouth daily.    Historical Provider, MD  nadolol (CORGARD) 40 MG tablet TAKE 1 TABLET BY MOUTH EVERY DAY 11/26/14   Junie Spencer, FNP  nitrofurantoin, macrocrystal-monohydrate, (MACROBID) 100 MG capsule Take 1 capsule by mouth 2 (two) times daily. 02/23/15   Historical Provider, MD  pantoprazole (PROTONIX) 40 MG tablet TAKE 1 TABLET BY MOUTH DAILY 06/09/14   Len Blalock, NP  Probiotic Product (PROBIOTIC PO) Take 1 tablet by mouth daily.    Historical Provider, MD  sulfamethoxazole-trimethoprim (BACTRIM DS,SEPTRA DS) 800-160 MG tablet Take 1 tablet by mouth 2 (two) times daily. Starting 03/02/2015 x 7 days. 02/27/15   Historical Provider, MD  traMADol (ULTRAM) 50 MG tablet Take 1 tablet (50 mg total) by mouth every 12 (twelve) hours as needed. 01/13/15   Junie Spencer, FNP  traZODone (DESYREL) 50 MG tablet Take 0.5-1 tablets (25-50 mg total) by mouth at bedtime as needed for sleep. 11/21/14   Ernestina Penna, MD   BP 117/45 mmHg  Pulse 63  Temp(Src) 97.8 F (36.6 C) (Oral)  Resp 18  Ht 5\' 5"  (1.651 m)  Wt 231 lb (104.781 kg)  BMI 38.44 kg/m2  SpO2 97% Physical Exam  1255: Physical examination:  Nursing  notes reviewed; Vital signs and O2 SAT reviewed;  Constitutional: Well developed, Well nourished, Well hydrated, In no acute distress; Head:  Normocephalic, atraumatic; Eyes: EOMI, PERRL, No scleral icterus; ENMT: Mouth and pharynx normal, Mucous membranes moist; Neck: Supple, Full range of motion, No lymphadenopathy; Cardiovascular: Regular rate and rhythm, No gallop; Respiratory: Breath sounds clear & equal bilaterally, No wheezes.  Speaking full sentences with ease, Normal respiratory effort/excursion; Chest: Nontender, Movement normal; Abdomen: Soft, +left sided abd tenderness to palp. No rebound or guarding. +ileostomy. Nondistended, Normal bowel sounds; Genitourinary: No CVA tenderness; Spine:  No midline CS, TS, LS tenderness. +TTP left lumbar paraspinal muscles. No rash.;; Extremities: Pulses normal, No tenderness, No edema, No calf edema or asymmetry.; Neuro:  AA&Ox3, Major CN grossly intact.  Speech clear. No gross focal motor or sensory deficits in extremities.; Skin: Color normal, Warm, Dry.    ED Course  Procedures (including critical care time)  Labs Review  Imaging Review  I have personally reviewed and evaluated these images and lab results as part of my medical decision-making.   EKG Interpretation None      MDM  MDM Reviewed: previous chart, nursing note and vitals Reviewed previous: labs Interpretation: labs, x-ray and CT scan   Results for orders placed or performed during the hospital encounter of 03/10/15  CBC with Differential  Result Value Ref Range   WBC 8.2 4.0 - 10.5 K/uL   RBC 3.81 (L) 3.87 - 5.11 MIL/uL   Hemoglobin 11.5 (L) 12.0 - 15.0 g/dL   HCT 16.1 (L) 09.6 - 04.5 %   MCV 89.5 78.0 - 100.0 fL   MCH 30.2 26.0 - 34.0 pg   MCHC 33.7 30.0 - 36.0 g/dL   RDW 40.9 (H) 81.1 - 91.4 %   Platelets 113 (L) 150 - 400 K/uL   Neutrophils Relative % 62 %   Neutro Abs 5.1 1.7 - 7.7 K/uL   Lymphocytes Relative 27 %   Lymphs Abs 2.2 0.7 - 4.0 K/uL   Monocytes  Relative 9 %   Monocytes Absolute 0.7 0.1 - 1.0 K/uL   Eosinophils Relative 2 %   Eosinophils Absolute 0.1 0.0 - 0.7 K/uL   Basophils Relative 0 %   Basophils Absolute 0.0 0.0 - 0.1 K/uL  Urinalysis, Routine w reflex microscopic (not at [facility])  Result Value Ref Range   Color, Urine YELLOW YELLOW   APPearance HAZY (A) CLEAR   Specific Gravity, Urine 1.020 1.005 - 1.030   pH 6.0 5.0 - 8.0   Glucose, UA NEGATIVE NEGATIVE mg/dL   Hgb urine dipstick LARGE (A) NEGATIVE   Bilirubin Urine NEGATIVE NEGATIVE   Ketones, ur NEGATIVE NEGATIVE mg/dL   Protein, ur NEGATIVE NEGATIVE mg/dL   Nitrite NEGATIVE NEGATIVE   Leukocytes, UA LARGE (A) NEGATIVE  Comprehensive metabolic panel  Result Value Ref Range   Sodium 131 (L) 135 - 145 mmol/L   Potassium 3.5 3.5 - 5.1 mmol/L   Chloride 108 101 - 111 mmol/L   CO2 17 (L) 22 - 32 mmol/L   Glucose, Bld 120 (H) 65 - 99 mg/dL   BUN 39 (H) 6 - 20 mg/dL   Creatinine, Ser 7.82 (H) 0.44 - 1.00 mg/dL   Calcium 8.6 (L) 8.9 - 10.3 mg/dL   Total Protein 5.5 (L) 6.5 - 8.1 g/dL   Albumin 2.9 (L) 3.5 - 5.0 g/dL   AST 34 15 - 41 U/L   ALT 26 14 - 54 U/L   Alkaline Phosphatase 183 (H) 38 - 126 U/L   Total Bilirubin 1.5 (H) 0.3 - 1.2 mg/dL   GFR calc non Af Amer 18 (L) >60 mL/min   GFR calc Af Amer 21 (L) >60 mL/min   Anion gap 6 5 - 15  Lipase, blood  Result Value Ref Range   Lipase 98 (H) 11 - 51 U/L  Urine microscopic-add on  Result Value Ref Range   Squamous Epithelial / LPF 0-5 (A) NONE SEEN   WBC, UA 6-30 0 - 5 WBC/hpf   RBC / HPF 6-30 0 - 5 RBC/hpf   Bacteria, UA MANY (A) NONE SEEN   Ct Abdomen Pelvis Wo Contrast 03/10/2015  CLINICAL DATA:  Central abdominal pain from epigastric region to back,  history of Crohn's disease and colon resection with ileostomy EXAM: CT ABDOMEN AND PELVIS WITHOUT CONTRAST TECHNIQUE: Multidetector CT imaging of the abdomen and pelvis was performed following the standard protocol without IV contrast. COMPARISON:  06/10/2014  FINDINGS: Lower chest:  Clear Hepatobiliary: Status post cholecystectomy.  Tips stent noted. Pancreas: Normal Spleen: Normal Adrenals/Urinary Tract: Negative Stomach/Bowel: Stomach is decompressed but otherwise normal. There is mild diffuse wall thickening of the duodenum and proximal jejunum, with these loops mildly dilated to a diameter of about 3.7 cm. There is an anterior abdominal wall hernia to the right of midline above the umbilicus involving the junction of proximal and mid jejunum. Beyond this, jejunum and ileum are relatively decompressed. Oral contrast extends throughout small bowel and into right lower abdominal ileostomy. Status post colectomy with decompressed sigmoid colon and rectum. Vascular/Lymphatic: No acute abnormalities Reproductive: No acute findings Other: No ascites Musculoskeletal: No acute findings IMPRESSION: Mild incomplete proximal small bowel obstruction related to anterior abdominal wall hernia. Oral contrast extends beyond this into ileostomy in there is no definite evidence of strangulation. Electronically Signed   By: Esperanza Heir M.D.   On: 03/10/2015 16:49   Dg Chest 2 View 03/10/2015  CLINICAL DATA:  Upper abdominal pain for 2 weeks, back pain EXAM: CHEST  2 VIEW COMPARISON:  07/02/2014 FINDINGS: Cardiomediastinal silhouette is stable. No acute infiltrate or pleural effusion. No pulmonary edema. Mild degenerative changes lower thoracic spine. IMPRESSION: No active cardiopulmonary disease. Electronically Signed   By: Natasha Mead M.D.   On: 03/10/2015 13:52     Results for GLENNYS, SCHORSCH (MRN [id number]) as of 03/10/2015 14:00  Ref. Range 06/09/2014 03:34 06/10/2014 05:43 06/11/2014 05:44 06/12/2014 05:49 07/02/2014 15:44 07/17/2014 13:28 03/10/2015 13:08  BUN Latest Ref Range: 6-20 mg/dL 29 (H) 27 (H) 22 (H) 19 13 23  (H) 39 (H)  Creatinine Latest Ref Range: 0.44-1.00 mg/dL 4.09 (H) 8.11 (H) 9.14 (H) 1.33 (H) 1.24 (H) 1.58 (H) 2.73 (H)    1710:  Hernia easily reduced. Pt NT over  hernia, no overlying skin erythema or ecchymosis.  BUN/Cr elevated from baseline; IVF given. Dx and testing d/w pt and family.  Questions answered.  Verb understanding, agreeable to admit.  T/C to [facility] Dr. Allena Katz, case discussed, including:  HPI, pertinent PM/SHx, VS/PE, dx testing, ED course and treatment:  Agreeable to admit, requests to consult General Surgery, write temporary orders, obtain medical bed to team APAdmits.  T/C to General Surgery Dr. Lovell Sheehan, case discussed, including:  HPI, pertinent PM/SHx, VS/PE, dx testing, ED course and treatment:  Agreeable to consult.    Samuel Jester, DO 03/14/15 0010

## 2015-03-10 NOTE — ED Notes (Signed)
IV to right AC positional.

## 2015-03-10 NOTE — ED Notes (Signed)
PT states she was treated for UTI on 02/24/15 and finished her Bactrim antibiotics last night. PT c/o blood in urine and lower abdominal pain radiating into her back.

## 2015-03-10 NOTE — Telephone Encounter (Signed)
I spoke with patient today. She says she is in excruciating pain.  Her spleen is enlarged. She can barely lie down. Pain radiating into her back. I advised her to go to the ED.

## 2015-03-10 NOTE — ED Notes (Signed)
RUQ ileostomy with pink stoma, light brown stool noted.

## 2015-03-10 NOTE — H&P (Addendum)
[facility] History and Physical   Patient: Kathryn [name] MEQ:[id number]   PCP: [name], FNP DOB: July 23, 1955   DOA: 03/10/2015   DOS: 03/10/2015   DOS: the patient was seen and examined on 03/10/2015  Referring physician: Dr. Thurnell Garbe Chief Complaint: Abdominal pain  HPI: Kathryn [name] is a 60 y.o. female with Past medical history of Crohn's disease status post colectomy and ileostomy creation, cirrhosis of the liver, hepatitis C, hypertension, chronic anemia with thrombocytopenia requiring iron transfusions. Patient presented with complaints of abdominal pain as well as back pain. She mentions that when she lies down she did have significant pain at home. She also felt that her spleen was being enlarged. While here in the ER she was complaining of pain primarily when she was moving. The pain was described as sharp pain. She also had some nausea but no mop vomiting. She denies having any fever or chills. No active bleeding or increase the discharge from the ileostomy. She denies any burning urination. She denies any blood in the urine. She completed her Bactrim yesterday. She mentions no other changes in her medication. Complains of some mild dizziness.  The patient is coming from home.  At her baseline ambulates without support And is independent for most of her ADL; manages her medication on her own.  Review of Systems: as mentioned in the history of present illness.  A comprehensive review of the other systems is negative.  Past Medical History  Diagnosis Date  . Hypertension   . Enteritis presumed infectious   . Crohn's   . Anemia   . Hepatitis C antibody test positive   . Hepatitis   . Renal insufficiency   . Cirrhosis ([place])   . Splenomegaly   . Bilateral leg edema 01/2014  . Chronic back pain    Past Surgical History  Procedure Laterality Date  . Colon surgery      MULTIPLE SURGERIES FOR CROHNS  . Ileostomy      multiple abdominal surgeries for Crohn's by Dr.  Anthony Sar  . Cholecystectomy    . Abscess drainage      abdominal  . Esophagogastroduodenoscopy  10/05/2010    Procedure: ESOPHAGOGASTRODUODENOSCOPY (EGD);  Surgeon: Rogene Houston, MD;  Location: [facility];  Service: Endoscopy;  Laterality: N/A;  7:30 am  . Cirrhosis    . Esophagogastroduodenoscopy N/A 09/11/2013    Procedure: ESOPHAGOGASTRODUODENOSCOPY (EGD);  Surgeon: Rogene Houston, MD;  Location: [facility];  Service: Endoscopy;  Laterality: N/A;  . Ileoscopy  09/11/2013    Procedure: ILEOSCOPY THROUGH STOMA;  Surgeon: Rogene Houston, MD;  Location: [facility];  Service: Endoscopy;;  . Givens capsule study N/A 09/12/2013    Procedure: GIVENS CAPSULE STUDY;  Surgeon: Rogene Houston, MD;  Location: [facility];  Service: Endoscopy;  Laterality: N/A;  . Givens capsule study N/A 11/25/2013    Procedure: GIVENS CAPSULE STUDY;  Surgeon: Rogene Houston, MD;  Location: [facility];  Service: Endoscopy;  Laterality: N/A;  . Varices cauterization Right     in the stoma of her ileostomy  . Tips procedure  11/2013    [facility]    Social History:  reports that she has never smoked. She has never used smokeless tobacco. She reports that she does not drink alcohol or use illicit drugs.  Allergies  Allergen Reactions  . Penicillins Rash    Has patient had a PCN reaction causing immediate rash, facial/tongue/throat swelling, SOB or lightheadedness with hypotension:  Yes Has patient had a PCN reaction causing severe rash involving mucus membranes or skin necrosis: No Has patient had a PCN reaction that required hospitalization No Has patient had a PCN reaction occurring within the last 10 years: No If all of the above answers are "NO", then may proceed with Cephalosporin use.     Family History  Problem Relation Age of Onset  . Cancer Mother   . Stroke Father     Prior to Admission medications   Medication Sig Start Date End Date Taking? Authorizing Provider  ALPRAZolam Duanne Moron) 1 MG  tablet Take 1 tablet (1 mg total) by mouth at bedtime as needed. Patient taking differently: Take 1 mg by mouth at bedtime.  01/29/15  Yes Sharion Balloon, FNP  Cholecalciferol (VITAMIN D-1000 MAX ST) 1000 UNITS tablet Take 1,000 Units by mouth daily.   Yes Historical Provider, MD  furosemide (LASIX) 20 MG tablet TAKE 1 TABLET BY MOUTH EVERY DAY 02/03/15  Yes Butch Penny, NP  gabapentin (NEURONTIN) 100 MG capsule Take 1 capsule (100 mg total) by mouth 3 (three) times daily. 10/23/14  Yes Chipper Herb, MD  Multiple Vitamins-Minerals (MULTIVITAMIN WITH MINERALS) tablet Take 1 tablet by mouth daily.   Yes Historical Provider, MD  nadolol (CORGARD) 40 MG tablet TAKE 1 TABLET BY MOUTH EVERY DAY 11/26/14  Yes Sharion Balloon, FNP  pantoprazole (PROTONIX) 40 MG tablet TAKE 1 TABLET BY MOUTH DAILY 06/09/14  Yes Butch Penny, NP  Probiotic Product (PROBIOTIC PO) Take 1 tablet by mouth daily.   Yes Historical Provider, MD  spironolactone (ALDACTONE) 25 MG tablet Take 1 tablet (25 mg total) by mouth daily. 06/12/14  Yes Lezlie Octave Black, NP  traMADol (ULTRAM) 50 MG tablet Take 1 tablet (50 mg total) by mouth every 12 (twelve) hours as needed. Patient taking differently: Take 50 mg by mouth every 12 (twelve) hours as needed for moderate pain.  01/13/15  Yes Sharion Balloon, FNP  traZODone (DESYREL) 50 MG tablet Take 0.5-1 tablets (25-50 mg total) by mouth at bedtime as needed for sleep. 11/21/14  Yes Chipper Herb, MD  nitrofurantoin, macrocrystal-monohydrate, (MACROBID) 100 MG capsule Take 1 capsule by mouth 2 (two) times daily. Reported on 03/10/2015 02/23/15   Historical Provider, MD  sulfamethoxazole-trimethoprim (BACTRIM DS,SEPTRA DS) 800-160 MG tablet Take 1 tablet by mouth 2 (two) times daily. Reported on 03/10/2015 02/27/15   Historical Provider, MD    Physical Exam: Filed Vitals:   03/10/15 1455 03/10/15 1703 03/10/15 2011 03/10/15 2102  BP: 110/68 104/60 101/45 86/39  Pulse: 76 60 60 59  Temp:   97.9  F (36.6 C) 97.6 F (36.4 C)  TempSrc:   Axillary Oral  Resp: _0 Height:   _1  (1.651 m)   Weight:   82 kg (180 lb 12.4 oz)   SpO2: 100% 100% 100% 96%    General: Alert, Awake and Oriented to Time, Place and Person. Appear in mild distress Eyes: PERRL ENT: Oral Mucosa clear moist. Neck: No JVD Cardiovascular: S1 and S2 Present, no Murmur, Peripheral Pulses Present Respiratory: Bilateral Air entry equal and Decreased,  Clear to Auscultation, no Crackles, no wheezes Abdomen: Bowel Sound present but sluggish, Soft and no tenderness Skin: no Rash Extremities: Bilateral Pedal edema, no calf tenderness Neurologic: Grossly no focal neuro deficit.  Labs on Admission:  CBC:  Recent Labs Lab 03/10/15 1308  WBC 8.2  NEUTROABS 5.1  HGB 11.5*  HCT 34.1*  MCV 89.5  PLT 113*    CMP     Component Value Date/Time   NA 131* 03/10/2015 1308   NA 137 07/02/2014 1544   K 3.5 03/10/2015 1308   CL 108 03/10/2015 1308   CO2 17* 03/10/2015 1308   GLUCOSE 120* 03/10/2015 1308   GLUCOSE 109* 07/02/2014 1544   BUN 39* 03/10/2015 1308   BUN 13 07/02/2014 1544   CREATININE 2.73* 03/10/2015 1308   CREATININE 1.30* 05/12/2014 1507   CALCIUM 8.6* 03/10/2015 1308   PROT 5.5* 03/10/2015 1308   PROT 6.1 07/04/2013 0908   ALBUMIN 2.9* 03/10/2015 1308   ALBUMIN 3.5 07/04/2013 0908   AST 34 03/10/2015 1308   ALT 26 03/10/2015 1308   ALKPHOS 183* 03/10/2015 1308   BILITOT 1.5* 03/10/2015 1308   GFRNONAA 18* 03/10/2015 1308   GFRAA 21* 03/10/2015 1308    No results for input(s): CKTOTAL, CKMB, CKMBINDEX, TROPONINI in the last 168 hours. BNP (last 3 results) No results for input(s): BNP in the last 8760 hours.  ProBNP (last 3 results) No results for input(s): PROBNP in the last 8760 hours.   Radiological Exams on Admission: Ct Abdomen Pelvis Wo Contrast  03/10/2015  CLINICAL DATA:  Central abdominal pain from epigastric region to back, history of Crohn's disease and colon  resection with ileostomy EXAM: CT ABDOMEN AND PELVIS WITHOUT CONTRAST TECHNIQUE: Multidetector CT imaging of the abdomen and pelvis was performed following the standard protocol without IV contrast. COMPARISON:  06/10/2014 FINDINGS: Lower chest:  Clear Hepatobiliary: Status post cholecystectomy.  Tips stent noted. Pancreas: Normal Spleen: Normal Adrenals/Urinary Tract: Negative Stomach/Bowel: Stomach is decompressed but otherwise normal. There is mild diffuse wall thickening of the duodenum and proximal jejunum, with these loops mildly dilated to a diameter of about 3.7 cm. There is an anterior abdominal wall hernia to the right of midline above the umbilicus involving the junction of proximal and mid jejunum. Beyond this, jejunum and ileum are relatively decompressed. Oral contrast extends throughout small bowel and into right lower abdominal ileostomy. Status post colectomy with decompressed sigmoid colon and rectum. Vascular/Lymphatic: No acute abnormalities Reproductive: No acute findings Other: No ascites Musculoskeletal: No acute findings IMPRESSION: Mild incomplete proximal small bowel obstruction related to anterior abdominal wall hernia. Oral contrast extends beyond this into ileostomy in there is no definite evidence of strangulation. Electronically Signed   By: Skipper Cliche M.D.   On: 03/10/2015 16:49   Dg Chest 2 View  03/10/2015  CLINICAL DATA:  Upper abdominal pain for 2 weeks, back pain EXAM: CHEST  2 VIEW COMPARISON:  07/02/2014 FINDINGS: Cardiomediastinal silhouette is stable. No acute infiltrate or pleural effusion. No pulmonary edema. Mild degenerative changes lower thoracic spine. IMPRESSION: No active cardiopulmonary disease. Electronically Signed   By: Lahoma Crocker M.D.   On: 03/10/2015 13:52   Assessment/Plan 1. Partial small bowel obstruction ([place]) Patient presents with complaints of abdominal pain as well as back pain and distention. CT abdomen is positive for partial small bowel  obstruction. She did have a reducible hernia. Gen. surgery is consulted. An S4 the ER physician they have recommended conservative management. Since the patient is not having any active vomiting will hold off on NG tube insertion. Patient will be monitored in med surge unit. IV hydration IV Zofran as needed. She remains nothing by mouth for bowel rest at present.  2. History of Crohn's disease, ileostomy. Monitor for excess output. Surgery consulted. Does not appear to be an active Crohn's inflammation causing her  obstruction at present. We will check ESR and CRP  3. Hepatic cirrhosis and splenomegaly. CT abdomen is not showing any significant splenomegaly. Patient does not appear to be having any active bleeding. Holding Aldactone as well as furosemide at present while the patient is receiving IV fluids. Holding nadolol while the patient is remaining hypotensive.  4. Hypotension. Acute kidney injury on chronic kidney disease stage III  Giving IV hydration. General surgery will be following. Likely dehydration. May benefit from albumin infusion of the blood pressure does not respond.  5. Chronic pancytopenia.  patient receives IV iron infusion at oncology clinic as an outpatient. We'll monitor closely. Using mechanical compression for DVT prophylaxis instead of pharmacological.  6. Peripheral neuropathy. Continuing gabapentin.  Nutrition: Nothing by mouth except ice chips and medications DVT Prophylaxis: mechanical compression device  Advance goals of care discussion: Full code   Consults: Gen. surgery  Family Communication: no family was present at bedside, at the time of interview. Disposition: Admitted as inpatient, med-surge unit.  Author: Berle Mull, MD [facility] Pager: [phone] 03/10/2015  If 7PM-7AM, please contact night-coverage [email or web address] Password [email or web address]

## 2015-03-10 NOTE — ED Notes (Signed)
Completed oral contrasts.

## 2015-03-11 DIAGNOSIS — N183 Chronic kidney disease, stage 3 (moderate): Secondary | ICD-10-CM

## 2015-03-11 DIAGNOSIS — N189 Chronic kidney disease, unspecified: Secondary | ICD-10-CM

## 2015-03-11 DIAGNOSIS — I10 Essential (primary) hypertension: Secondary | ICD-10-CM

## 2015-03-11 DIAGNOSIS — E440 Moderate protein-calorie malnutrition: Secondary | ICD-10-CM | POA: Insufficient documentation

## 2015-03-11 DIAGNOSIS — R109 Unspecified abdominal pain: Secondary | ICD-10-CM

## 2015-03-11 DIAGNOSIS — N179 Acute kidney failure, unspecified: Secondary | ICD-10-CM

## 2015-03-11 DIAGNOSIS — K5669 Other intestinal obstruction: Secondary | ICD-10-CM

## 2015-03-11 LAB — CBC
HCT: 30.3 % — ABNORMAL LOW (ref 36.0–46.0)
HEMOGLOBIN: 10.2 g/dL — AB (ref 12.0–15.0)
MCH: 30.6 pg (ref 26.0–34.0)
MCHC: 33.7 g/dL (ref 30.0–36.0)
MCV: 91 fL (ref 78.0–100.0)
Platelets: 102 10*3/uL — ABNORMAL LOW (ref 150–400)
RBC: 3.33 MIL/uL — ABNORMAL LOW (ref 3.87–5.11)
RDW: 15.9 % — AB (ref 11.5–15.5)
WBC: 8.3 10*3/uL (ref 4.0–10.5)

## 2015-03-11 LAB — COMPREHENSIVE METABOLIC PANEL
ALBUMIN: 2.6 g/dL — AB (ref 3.5–5.0)
ALK PHOS: 129 U/L — AB (ref 38–126)
ALT: 21 U/L (ref 14–54)
ANION GAP: 7 (ref 5–15)
AST: 28 U/L (ref 15–41)
BILIRUBIN TOTAL: 1.4 mg/dL — AB (ref 0.3–1.2)
BUN: 35 mg/dL — AB (ref 6–20)
CALCIUM: 7.8 mg/dL — AB (ref 8.9–10.3)
CO2: 14 mmol/L — AB (ref 22–32)
Chloride: 112 mmol/L — ABNORMAL HIGH (ref 101–111)
Creatinine, Ser: 2.27 mg/dL — ABNORMAL HIGH (ref 0.44–1.00)
GFR calc Af Amer: 26 mL/min — ABNORMAL LOW (ref >60)
GFR calc non Af Amer: 23 mL/min — ABNORMAL LOW (ref >60)
GLUCOSE: 74 mg/dL (ref 65–99)
Potassium: 3.6 mmol/L (ref 3.5–5.1)
SODIUM: 133 mmol/L — AB (ref 135–145)
Total Protein: 4.9 g/dL — ABNORMAL LOW (ref 6.5–8.1)

## 2015-03-11 LAB — PROTIME-INR
INR: 1.67 — ABNORMAL HIGH (ref 0.00–1.49)
PROTHROMBIN TIME: 19.7 s — AB (ref 11.6–15.2)

## 2015-03-11 LAB — C-REACTIVE PROTEIN: CRP: <0.5 mg/dL (ref <1.0)

## 2015-03-11 LAB — LIPASE, BLOOD: Lipase: 59 U/L — ABNORMAL HIGH (ref 11–51)

## 2015-03-11 LAB — LACTIC ACID, PLASMA: LACTIC ACID, VENOUS: 0.6 mmol/L (ref 0.5–2.0)

## 2015-03-11 MED ORDER — ENSURE ENLIVE PO LIQD
237.0000 mL | Freq: Three times a day (TID) | ORAL | Status: DC
  Administered 2015-03-11 – 2015-03-12 (×4): 237 mL via ORAL

## 2015-03-11 NOTE — Progress Notes (Signed)
[facility] PROGRESS NOTE  Kathryn [name] QIH:[id number] DOB: 21-Jul-1955 DOA: 03/10/2015 PCP: [name], FNP  Assessment/Plan: 1. Partial small bowel obstruction: CT abdomen is positive for partial small bowel obstruction. Reducible hernia. Gen. surgery is consulted. No emesis and not requiring NGT insertion. Since the patient is not having any active vomiting will hold off on NG tube insertion. IV hydration IV Zofran as needed. Patient receiving IV morphine for pain control- no pain appreciated at this time.  If tolerating PO will d/c IVF.    2. History of Crohn's disease, ileostomy: no excessive output noted. Surgery consulted. ESR WNL  3. Hepatic cirrhosis and splenomegaly. Patient home medications of Aldactone, furosemide and nadolol held.  Patient remains hypotensive at this time.    4. Hypotension. Acute kidney injury on chronic kidney disease stage III. Patient has been fluid resuscitated and is not on clears.  Will d/c IVF if patient tolerating PO.  Orthostatic vital signs ordered.  Patient encouraged to change positions slowly. S/p 2 doses albumin  5. Chronic pancytopenia.  patient receives IV iron infusion at oncology clinic as an outpatient. SCD's not in place so will encourage nursing staff to apply.  CBC values stable  6. Peripheral neuropathy. Continuing gabapentin.  Nutrition: full liquid diet DVT Prophylaxis: mechanical compression device   Code Status: Full Code Family Communication: son at bedside Disposition Plan: discharge when tolerating regular diet   Consultants:  General Surgery  Procedures:  none  Antibiotics:  none  HPI/Subjective: Kathryn [name] is a 60 y.o. female with Past medical history of Crohn's disease status post colectomy and ileostomy creation, cirrhosis of the liver, hepatitis C, hypertension, chronic anemia with thrombocytopenia requiring iron transfusions.  Objective: Filed Vitals:   03/11/15 0610 03/11/15 0828  BP:  102/45 104/46  Pulse: 63 66  Temp: 97.6 F (36.4 C)   Resp: 20     Intake/Output Summary (Last 24 hours) at 03/11/15 0953 Last data filed at 03/11/15 0600  Gross per 24 hour  Intake 2173.33 ml  Output      0 ml  Net 2173.33 ml   Filed Weights   03/10/15 1230 03/10/15 2011 03/11/15 0610  Weight: 104.781 kg (231 lb) 82 kg (180 lb 12.4 oz) 99.202 kg (218 lb 11.2 oz)    Exam:   General:  Elderly female in no acute distress, laying comfortably in bed, talking in full sentences  Cardiovascular: S1S2, crescendo murmur II/VI heard best at left upper sternal border and left lower sternal border with radiation to left carotid  Respiratory: clear to auscultation bilaterally, no wheezing, rhonchi or rales  Abdomen: soft, minimally tender at upper aspect of ilieostomy stoma, bowel sounds in all four quadrants  Musculoskeletal: nonpitting edema of lower extremities, peripheral pulses appreciated in dorsalis pedis bilaterally   Data Reviewed: Basic Metabolic Panel:  Recent Labs Lab 03/10/15 1308 03/11/15 0129  NA 131* 133*  K 3.5 3.6  CL 108 112*  CO2 17* 14*  GLUCOSE 120* 74  BUN 39* 35*  CREATININE 2.73* 2.27*  CALCIUM 8.6* 7.8*   Liver Function Tests:  Recent Labs Lab 03/10/15 1308 03/11/15 0129  AST 34 28  ALT 26 21  ALKPHOS 183* 129*  BILITOT 1.5* 1.4*  PROT 5.5* 4.9*  ALBUMIN 2.9* 2.6*    Recent Labs Lab 03/10/15 1308 03/11/15 0129  LIPASE 98* 59*   No results for input(s): AMMONIA in the last 168 hours. CBC:  Recent Labs Lab 03/10/15 1308 03/10/15 2220 03/11/15 0129  WBC  8.2 7.9 8.3  NEUTROABS 5.1  --   --   HGB 11.5* 10.2* 10.2*  HCT 34.1* 30.1* 30.3*  MCV 89.5 90.1 91.0  PLT 113* 97* 102*   Cardiac Enzymes: No results for input(s): CKTOTAL, CKMB, CKMBINDEX, TROPONINI in the last 168 hours. BNP (last 3 results) No results for input(s): BNP in the last 8760 hours.  ProBNP (last 3 results) No results for input(s): PROBNP in the last 8760  hours.  CBG: No results for input(s): GLUCAP in the last 168 hours.  Recent Results (from the past 240 hour(s))  MRSA PCR Screening     Status: None   Collection Time: 03/10/15 10:25 PM  Result Value Ref Range Status   MRSA by PCR NEGATIVE NEGATIVE Final    Comment:        The GeneXpert MRSA Assay (FDA approved for NASAL specimens only), is one component of a comprehensive MRSA colonization surveillance program. It is not intended to diagnose MRSA infection nor to guide or monitor treatment for MRSA infections.      Studies: Ct Abdomen Pelvis Wo Contrast  03/10/2015  CLINICAL DATA:  Central abdominal pain from epigastric region to back, history of Crohn's disease and colon resection with ileostomy EXAM: CT ABDOMEN AND PELVIS WITHOUT CONTRAST TECHNIQUE: Multidetector CT imaging of the abdomen and pelvis was performed following the standard protocol without IV contrast. COMPARISON:  06/10/2014 FINDINGS: Lower chest:  Clear Hepatobiliary: Status post cholecystectomy.  Tips stent noted. Pancreas: Normal Spleen: Normal Adrenals/Urinary Tract: Negative Stomach/Bowel: Stomach is decompressed but otherwise normal. There is mild diffuse wall thickening of the duodenum and proximal jejunum, with these loops mildly dilated to a diameter of about 3.7 cm. There is an anterior abdominal wall hernia to the right of midline above the umbilicus involving the junction of proximal and mid jejunum. Beyond this, jejunum and ileum are relatively decompressed. Oral contrast extends throughout small bowel and into right lower abdominal ileostomy. Status post colectomy with decompressed sigmoid colon and rectum. Vascular/Lymphatic: No acute abnormalities Reproductive: No acute findings Other: No ascites Musculoskeletal: No acute findings IMPRESSION: Mild incomplete proximal small bowel obstruction related to anterior abdominal wall hernia. Oral contrast extends beyond this into ileostomy in there is no definite  evidence of strangulation. Electronically Signed   By: Skipper Cliche M.D.   On: 03/10/2015 16:49   Dg Chest 2 View  03/10/2015  CLINICAL DATA:  Upper abdominal pain for 2 weeks, back pain EXAM: CHEST  2 VIEW COMPARISON:  07/02/2014 FINDINGS: Cardiomediastinal silhouette is stable. No acute infiltrate or pleural effusion. No pulmonary edema. Mild degenerative changes lower thoracic spine. IMPRESSION: No active cardiopulmonary disease. Electronically Signed   By: Lahoma Crocker M.D.   On: 03/10/2015 13:52    Scheduled Meds: . pantoprazole (PROTONIX) IV  40 mg Intravenous Q24H  . polyethylene glycol  17 g Oral Daily   Continuous Infusions: . sodium chloride 100 mL/hr at 03/10/15 1816    Principal Problem:   Partial small bowel obstruction ([facility]) Active Problems:   Hypertension   Crohn's disease ([facility])   Hepatic cirrhosis ([facility])   GERD (gastroesophageal reflux disease)   Peripheral neuropathy ([facility])   Thrombocytopenia ([facility])   CKD (chronic kidney disease), stage III   Iron deficiency   Acute on chronic renal failure ([place])        Coralie Common  Resident Physician  03/11/2015, 9:53 AM  LOS: 1 day

## 2015-03-11 NOTE — Consult Note (Signed)
Reason for Consult: Incisional hernia, partial small bowel obstruction Referring Physician: Hospitalist  Kathryn [name] is an 59 y.o. female.  HPI: Patient is a 59-year-old white female with multiple medical problems including cirrhosis, splenomegaly, renal insufficiency, hepatitis C, Crohn's disease who is had multiple abdominal surgeries in the past including ileostomy formation. Patient states that she did have some nausea earlier this week. She has known about having an incisional hernia in the past. Her ostomy output has been normal. She is currently hungry.  Past Medical History  Diagnosis Date  . Hypertension   . Enteritis presumed infectious   . Crohn's   . Anemia   . Hepatitis C antibody test positive   . Hepatitis   . Renal insufficiency   . Cirrhosis (HCC)   . Splenomegaly   . Bilateral leg edema 01/2014  . Chronic back pain     Past Surgical History  Procedure Laterality Date  . Colon surgery      MULTIPLE SURGERIES FOR CROHNS  . Ileostomy      multiple abdominal surgeries for Crohn's by Dr. DeMason  . Cholecystectomy    . Abscess drainage      abdominal  . Esophagogastroduodenoscopy  10/05/2010    Procedure: ESOPHAGOGASTRODUODENOSCOPY (EGD);  Surgeon: Najeeb U Rehman, MD;  [facility]: [facility];  Service: Endoscopy;  Laterality: N/A;  7:30 am  . Cirrhosis    . Esophagogastroduodenoscopy N/A 09/11/2013    Procedure: ESOPHAGOGASTRODUODENOSCOPY (EGD);  Surgeon: Najeeb U Rehman, MD;  Location: [facility];  Service: Endoscopy;  Laterality: N/A;  . Ileoscopy  09/11/2013    Procedure: ILEOSCOPY THROUGH STOMA;  Surgeon: Najeeb U Rehman, MD;  Location: [facility];  Service: Endoscopy;;  . Givens capsule study N/A 09/12/2013    Procedure: GIVENS CAPSULE STUDY;  Surgeon: Najeeb U Rehman, MD;  Location: [facility];  Service: Endoscopy;  Laterality: N/A;  . Givens capsule study N/A 11/25/2013    Procedure: GIVENS CAPSULE STUDY;  Surgeon: Najeeb U Rehman, MD;  Location: [facility];  Service: Endoscopy;  Laterality: N/A;  . Varices cauterization Right     in the stoma of her ileostomy  . Tips procedure  11/2013    [facility]     Family History  Problem Relation Age of Onset  . Cancer Mother   . Stroke Father     Social History:  reports that she has never smoked. She has never used smokeless tobacco. She reports that she does not drink alcohol or use illicit drugs.  Allergies:  Allergies  Allergen Reactions  . Penicillins Rash    Has patient had a PCN reaction causing immediate rash, facial/tongue/throat swelling, SOB or lightheadedness with hypotension: Yes Has patient had a PCN reaction causing severe rash involving mucus membranes or skin necrosis: No Has patient had a PCN reaction that required hospitalization No Has patient had a PCN reaction occurring within the last 10 years: No If all of the above answers are "NO", then may proceed with Cephalosporin use.     Medications: I have reviewed the patient's current medications.  Results for orders placed or performed during the hospital encounter of 03/10/15 (from the past 48 hour(s))  Urinalysis, Routine w reflex microscopic (not at [facility])     Status: Abnormal   Collection Time: 03/10/15 12:50 PM  Result Value Ref Range   Color, Urine YELLOW YELLOW   APPearance HAZY (A) CLEAR   Specific Gravity, Urine 1.020 1.005 - 1.030   pH 6.0 5.0 -   8.0   Glucose, UA NEGATIVE NEGATIVE mg/dL   Hgb urine dipstick LARGE (A) NEGATIVE   Bilirubin Urine NEGATIVE NEGATIVE   Ketones, ur NEGATIVE NEGATIVE mg/dL   Protein, ur NEGATIVE NEGATIVE mg/dL   Nitrite NEGATIVE NEGATIVE   Leukocytes, UA LARGE (A) NEGATIVE  Urine microscopic-add on     Status: Abnormal   Collection Time: 03/10/15 12:50 PM  Result Value Ref Range   Squamous Epithelial / LPF 0-5 (A) NONE SEEN   WBC, UA 6-30 0 - 5 WBC/hpf   RBC / HPF 6-30 0 - 5 RBC/hpf   Bacteria, UA MANY (A) NONE SEEN  CBC with Differential     Status: Abnormal    Collection Time: 03/10/15  1:08 PM  Result Value Ref Range   WBC 8.2 4.0 - 10.5 K/uL   RBC 3.81 (L) 3.87 - 5.11 MIL/uL   Hemoglobin 11.5 (L) 12.0 - 15.0 g/dL   HCT 34.1 (L) 36.0 - 46.0 %   MCV 89.5 78.0 - 100.0 fL   MCH 30.2 26.0 - 34.0 pg   MCHC 33.7 30.0 - 36.0 g/dL   RDW 16.0 (H) 11.5 - 15.5 %   Platelets 113 (L) 150 - 400 K/uL    Comment: SPECIMEN CHECKED FOR CLOTS PLATELET COUNT CONFIRMED BY SMEAR    Neutrophils Relative % 62 %   Neutro Abs 5.1 1.7 - 7.7 K/uL   Lymphocytes Relative 27 %   Lymphs Abs 2.2 0.7 - 4.0 K/uL   Monocytes Relative 9 %   Monocytes Absolute 0.7 0.1 - 1.0 K/uL   Eosinophils Relative 2 %   Eosinophils Absolute 0.1 0.0 - 0.7 K/uL   Basophils Relative 0 %   Basophils Absolute 0.0 0.0 - 0.1 K/uL  Comprehensive metabolic panel     Status: Abnormal   Collection Time: 03/10/15  1:08 PM  Result Value Ref Range   Sodium 131 (L) 135 - 145 mmol/L   Potassium 3.5 3.5 - 5.1 mmol/L   Chloride 108 101 - 111 mmol/L   CO2 17 (L) 22 - 32 mmol/L   Glucose, Bld 120 (H) 65 - 99 mg/dL   BUN 39 (H) 6 - 20 mg/dL   Creatinine, Ser 2.73 (H) 0.44 - 1.00 mg/dL   Calcium 8.6 (L) 8.9 - 10.3 mg/dL   Total Protein 5.5 (L) 6.5 - 8.1 g/dL   Albumin 2.9 (L) 3.5 - 5.0 g/dL   AST 34 15 - 41 U/L   ALT 26 14 - 54 U/L   Alkaline Phosphatase 183 (H) 38 - 126 U/L   Total Bilirubin 1.5 (H) 0.3 - 1.2 mg/dL   GFR calc non Af Amer 18 (L) >60 mL/min   GFR calc Af Amer 21 (L) >60 mL/min    Comment: (NOTE) The eGFR has been calculated using the CKD EPI equation. This calculation has not been validated in all clinical situations. eGFR's persistently <60 mL/min signify possible Chronic Kidney Disease.    Anion gap 6 5 - 15  Lipase, blood     Status: Abnormal   Collection Time: 03/10/15  1:08 PM  Result Value Ref Range   Lipase 98 (H) 11 - 51 U/L  Sedimentation rate     Status: None   Collection Time: 03/10/15 10:20 PM  Result Value Ref Range   Sed Rate 2 0 - 22 mm/hr  Lactic acid,  plasma     Status: None   Collection Time: 03/10/15 10:20 PM  Result Value Ref Range   Lactic Acid,   Venous 0.6 0.5 - 2.0 mmol/L  CBC     Status: Abnormal   Collection Time: 03/10/15 10:20 PM  Result Value Ref Range   WBC 7.9 4.0 - 10.5 K/uL   RBC 3.34 (L) 3.87 - 5.11 MIL/uL   Hemoglobin 10.2 (L) 12.0 - 15.0 g/dL   HCT 30.1 (L) 36.0 - 46.0 %   MCV 90.1 78.0 - 100.0 fL   MCH 30.5 26.0 - 34.0 pg   MCHC 33.9 30.0 - 36.0 g/dL   RDW 15.8 (H) 11.5 - 15.5 %   Platelets 97 (L) 150 - 400 K/uL    Comment: SPECIMEN CHECKED FOR CLOTS CONSISTENT WITH PREVIOUS RESULT   MRSA PCR Screening     Status: None   Collection Time: 03/10/15 10:25 PM  Result Value Ref Range   MRSA by PCR NEGATIVE NEGATIVE    Comment:        The GeneXpert MRSA Assay (FDA approved for NASAL specimens only), is one component of a comprehensive MRSA colonization surveillance program. It is not intended to diagnose MRSA infection nor to guide or monitor treatment for MRSA infections.   Comprehensive metabolic panel     Status: Abnormal   Collection Time: 03/11/15  1:29 AM  Result Value Ref Range   Sodium 133 (L) 135 - 145 mmol/L   Potassium 3.6 3.5 - 5.1 mmol/L   Chloride 112 (H) 101 - 111 mmol/L   CO2 14 (L) 22 - 32 mmol/L   Glucose, Bld 74 65 - 99 mg/dL   BUN 35 (H) 6 - 20 mg/dL   Creatinine, Ser 2.27 (H) 0.44 - 1.00 mg/dL   Calcium 7.8 (L) 8.9 - 10.3 mg/dL   Total Protein 4.9 (L) 6.5 - 8.1 g/dL   Albumin 2.6 (L) 3.5 - 5.0 g/dL   AST 28 15 - 41 U/L   ALT 21 14 - 54 U/L   Alkaline Phosphatase 129 (H) 38 - 126 U/L   Total Bilirubin 1.4 (H) 0.3 - 1.2 mg/dL   GFR calc non Af Amer 23 (L) >60 mL/min   GFR calc Af Amer 26 (L) >60 mL/min    Comment: (NOTE) The eGFR has been calculated using the CKD EPI equation. This calculation has not been validated in all clinical situations. eGFR's persistently <60 mL/min signify possible Chronic Kidney Disease.    Anion gap 7 5 - 15  CBC     Status: Abnormal    Collection Time: 03/11/15  1:29 AM  Result Value Ref Range   WBC 8.3 4.0 - 10.5 K/uL   RBC 3.33 (L) 3.87 - 5.11 MIL/uL   Hemoglobin 10.2 (L) 12.0 - 15.0 g/dL   HCT 30.3 (L) 36.0 - 46.0 %   MCV 91.0 78.0 - 100.0 fL   MCH 30.6 26.0 - 34.0 pg   MCHC 33.7 30.0 - 36.0 g/dL   RDW 15.9 (H) 11.5 - 15.5 %   Platelets 102 (L) 150 - 400 K/uL    Comment: SPECIMEN CHECKED FOR CLOTS CONSISTENT WITH PREVIOUS RESULT   Protime-INR     Status: Abnormal   Collection Time: 03/11/15  1:29 AM  Result Value Ref Range   Prothrombin Time 19.7 (H) 11.6 - 15.2 seconds   INR 1.67 (H) 0.00 - 1.49  Lipase, blood     Status: Abnormal   Collection Time: 03/11/15  1:29 AM  Result Value Ref Range   Lipase 59 (H) 11 - 51 U/L  Lactic acid, plasma     Status: None  Collection Time: 03/11/15  1:29 AM  Result Value Ref Range   Lactic Acid, Venous 0.6 0.5 - 2.0 mmol/L    Ct Abdomen Pelvis Wo Contrast  03/10/2015  CLINICAL DATA:  Central abdominal pain from epigastric region to back, history of Crohn's disease and colon resection with ileostomy EXAM: CT ABDOMEN AND PELVIS WITHOUT CONTRAST TECHNIQUE: Multidetector CT imaging of the abdomen and pelvis was performed following the standard protocol without IV contrast. COMPARISON:  06/10/2014 FINDINGS: Lower chest:  Clear Hepatobiliary: Status post cholecystectomy.  Tips stent noted. Pancreas: Normal Spleen: Normal Adrenals/Urinary Tract: Negative Stomach/Bowel: Stomach is decompressed but otherwise normal. There is mild diffuse wall thickening of the duodenum and proximal jejunum, with these loops mildly dilated to a diameter of about 3.7 cm. There is an anterior abdominal wall hernia to the right of midline above the umbilicus involving the junction of proximal and mid jejunum. Beyond this, jejunum and ileum are relatively decompressed. Oral contrast extends throughout small bowel and into right lower abdominal ileostomy. Status post colectomy with decompressed sigmoid colon  and rectum. Vascular/Lymphatic: No acute abnormalities Reproductive: No acute findings Other: No ascites Musculoskeletal: No acute findings IMPRESSION: Mild incomplete proximal small bowel obstruction related to anterior abdominal wall hernia. Oral contrast extends beyond this into ileostomy in there is no definite evidence of strangulation. Electronically Signed   By: Skipper Cliche M.D.   On: 03/10/2015 16:49   Dg Chest 2 View  03/10/2015  CLINICAL DATA:  Upper abdominal pain for 2 weeks, back pain EXAM: CHEST  2 VIEW COMPARISON:  07/02/2014 FINDINGS: Cardiomediastinal silhouette is stable. No acute infiltrate or pleural effusion. No pulmonary edema. Mild degenerative changes lower thoracic spine. IMPRESSION: No active cardiopulmonary disease. Electronically Signed   By: Lahoma Crocker M.D.   On: 03/10/2015 13:52    ROS: See chart Blood pressure 104/46, pulse 66, temperature 97.6 F (36.4 C), temperature source Oral, resp. rate 20, height 5' 5" (1.651 m), weight 99.202 kg (218 lb 11.2 oz), SpO2 97 %. Physical Exam: Pleasant white female in no acute distress. Abdomen is soft, nontender, nondistended. Ileostomy in the right side of the abdomen. The incisional hernia has reduced itself.   Assessment/Plan: Impression: Incisional hernia, chronic Partial bowel obstruction, resolved I suspect that she has had episodes of partial bowel obstructions in the past. Given her extensive comorbidities and multiple abdominal surgeries, no need for acute surgical intervention. I would only fix this hernia should it become incarcerated or a bowel obstruction complete. Plan: Will start full liquid diet. May advance as tolerated. Will follow peripherally with you.  Athenia Rys A 03/11/2015, 9:06 AM

## 2015-03-11 NOTE — Progress Notes (Signed)
Initial Nutrition Assessment  DOCUMENTATION CODES:   Non-severe (moderate) malnutrition in context of chronic illness  INTERVENTION:   Provide Ensure Enlive po TID, each supplement provides 350 kcal and 20 grams of protein.   NUTRITION DIAGNOSIS:   Inadequate oral intake related to poor appetite, nausea as evidenced by per patient/family report.  GOAL:   Patient will meet greater than or equal to 90% of their needs  MONITOR:   PO intake, Supplement acceptance, Diet advancement, I & O's, Weight trends  REASON FOR ASSESSMENT:   Malnutrition Screening Tool   ASSESSMENT:   60 y.o. female with past medical history of Crohn's disease status post colectomy and ileostomy creation, cirrhosis of the liver, hepatitis C, HTN, chronic anemia with thrombocytopenia requiring iron transfusions.   Visited with patient at bedside.  She reported that she had no breakfast, only ice chips, since her diet was advanced to full liquids this morning.  She reports being hungry. Pt reports poor appetite for about 3 weeks.  During this time she was consuming 1-2 small meals/snacks per day.  She would consume homemade soups like tomato soup or chicken noodle soup.  Occasionally she would consume a sandwich or some fruit.  Pt reports that she does not care much for sweets.  Prior to this 3 week period patient was eating well.  She reports 3 meals per day saying that "sometime I ate too well".   Patient reports usual body weight of 240 lbs (not confirmed in chart review); however, her weight has been gradually declining for a while.  Per chart review, patient was 231 lbs on 02/27/15, suggesting a 13 lb weight loss over approximately 2 weeks (5.9%).    Nutrition Focused Physical Exam was completed. Findings include mild fat depletion limited to upper arm region and mild muscle depletion limited to the temple region.  All other areas were found to be well nourished and no edema was noted.   RD encouraged patient  to continue good liquid po's while on full liquid diet to meet caloric and protein needs and to prevent dehydration.  RD provided patient with a chocolate Ensure at the time of visit.   Diet Order:  Diet full liquid Room service appropriate?: Yes; Fluid consistency:: Thin  Skin:  Reviewed, no issues  Last BM:  PTA  Height:   Ht Readings from Last 1 Encounters:  03/10/15 5\' 5"  (1.651 m)   Weight:   Wt Readings from Last 1 Encounters:  03/11/15 218 lb 11.2 oz (99.202 kg)   Ideal Body Weight:  56.8 kg  BMI:  Body mass index is 36.39 kg/(m^2).  Estimated Nutritional Needs:   Kcal:  1600 - 1800  Protein:  68 - 78 grams  Fluid:  1.6 - 1.8 L  EDUCATION NEEDS:   No education needs identified at this time  Doroteo Glassman, Dietetic Intern Pager: [phone]

## 2015-03-12 DIAGNOSIS — N179 Acute kidney failure, unspecified: Secondary | ICD-10-CM | POA: Insufficient documentation

## 2015-03-12 DIAGNOSIS — I959 Hypotension, unspecified: Secondary | ICD-10-CM

## 2015-03-12 LAB — COMPREHENSIVE METABOLIC PANEL
ALT: 19 U/L (ref 14–54)
AST: 25 U/L (ref 15–41)
Albumin: 2.3 g/dL — ABNORMAL LOW (ref 3.5–5.0)
Alkaline Phosphatase: 127 U/L — ABNORMAL HIGH (ref 38–126)
Anion gap: 5 (ref 5–15)
BUN: 40 mg/dL — AB (ref 6–20)
CHLORIDE: 111 mmol/L (ref 101–111)
CO2: 15 mmol/L — ABNORMAL LOW (ref 22–32)
Calcium: 8.1 mg/dL — ABNORMAL LOW (ref 8.9–10.3)
Creatinine, Ser: 2.25 mg/dL — ABNORMAL HIGH (ref 0.44–1.00)
GFR, EST AFRICAN AMERICAN: 26 mL/min — AB (ref >60)
GFR, EST NON AFRICAN AMERICAN: 23 mL/min — AB (ref >60)
Glucose, Bld: 121 mg/dL — ABNORMAL HIGH (ref 65–99)
POTASSIUM: 3.8 mmol/L (ref 3.5–5.1)
Sodium: 131 mmol/L — ABNORMAL LOW (ref 135–145)
Total Bilirubin: 1.2 mg/dL (ref 0.3–1.2)
Total Protein: 4.4 g/dL — ABNORMAL LOW (ref 6.5–8.1)

## 2015-03-12 LAB — CBC
HEMATOCRIT: 28 % — AB (ref 36.0–46.0)
Hemoglobin: 9.7 g/dL — ABNORMAL LOW (ref 12.0–15.0)
MCH: 31.4 pg (ref 26.0–34.0)
MCHC: 34.6 g/dL (ref 30.0–36.0)
MCV: 90.6 fL (ref 78.0–100.0)
Platelets: 79 10*3/uL — ABNORMAL LOW (ref 150–400)
RBC: 3.09 MIL/uL — AB (ref 3.87–5.11)
RDW: 16.1 % — AB (ref 11.5–15.5)
WBC: 5.9 10*3/uL (ref 4.0–10.5)

## 2015-03-12 LAB — URINE CULTURE

## 2015-03-12 NOTE — Progress Notes (Signed)
Patient states understanding of discharge instructions.  

## 2015-03-12 NOTE — Care Management Important Message (Signed)
Important Message  Patient Details  Name: BLAISE KNAUS MRN: [id number] Date of Birth: [birth year]   Medicare Important Message Given:  N/A - LOS <3 / Initial given by admissions    Malcolm Metro, RN 03/12/2015, 1:48 PM

## 2015-03-12 NOTE — Discharge Summary (Signed)
Physician Discharge Summary  Kathryn [name] ZOX:[id number] DOB: Aug 07, 1955 DOA: 03/10/2015  PCP: Kathryn Rodney, FNP  Admit date: 03/10/2015 Discharge date: 03/12/2015  Recommendations for Outpatient Follow-up:  Follow up for Basic Metabolic Panel blood draw within 1 week   Discharge Diagnoses:  1. Partial Small bowel obstruction 2. Abdominal pain 3. Acute on chronic renal failure 4. Hepatic cirrhosis 5. Hypotension  Discharge Condition: stable Disposition: home  Diet recommendation: regular diet  Filed Weights   03/10/15 2011 03/11/15 0610 03/12/15 0300  Weight: 82 kg (180 lb 12.4 oz) 99.202 kg (218 lb 11.2 oz) 99.882 kg (220 lb 3.2 oz)    History of present illness:  Kathryn [name] is a 60 y.o. female with Past medical history of Crohn's disease status post colectomy and ileostomy creation, cirrhosis of the liver, hepatitis C, hypertension, chronic anemia with thrombocytopenia requiring iron transfusions. Patient presented with complaints of abdominal pain as well as back pain. She mentions that when she lies down she did have significant pain at home. She also felt that her spleen was being enlarged. While here in the ER she was complaining of pain primarily when she was moving. The pain was described as sharp pain. She also had some nausea but no mop vomiting. She denies having any fever or chills. No active bleeding or increase the discharge from the ileostomy. She denies any burning urination. She denies any blood in the urine. She completed her Bactrim yesterday. She mentions no other changes in her medication. Complains of some mild dizziness. Ileostomy output normal and incisional hernia noted at time of admission.   Hospital Course:  Patient was admitted and evaluated by general surgery.  At that time her incisional hernia, which was present at time of admission, was found to be reduced.  Patient abdomen was nontender and she was found to not require surgical intervention.   Her diet was advanced from NPO to full liquids then to a regular diet on day of discharge.  She had adequate urine output and was having bowel movements.  On day of discharge her abdominal pain was significantly improved.  During hospitalization blood pressure occasionally hypotensive despite fluid resuscitation.  Likely, this was due to her underlying liver disease.  Her creatinine was also found to be elevated at time of admission and did improve throughout hospitalization therefore making hepatorenal syndrome unlikely.    Discharge Instructions  Discharge Instructions    Call MD for:  persistant dizziness or light-headedness    Complete by:  As directed      Call MD for:  persistant nausea and vomiting    Complete by:  As directed      Call MD for:  severe uncontrolled pain    Complete by:  As directed      Diet - low sodium heart healthy    Complete by:  As directed      Discharge instructions    Complete by:  As directed   Hold all diuretics     Increase activity slowly    Complete by:  As directed           Current Discharge Medication List    CONTINUE these medications which have NOT CHANGED   Details  ALPRAZolam (XANAX) 1 MG tablet Take 1 tablet (1 mg total) by mouth at bedtime as needed. Qty: 30 tablet, Refills: 2    Cholecalciferol (VITAMIN D-1000 MAX ST) 1000 UNITS tablet Take 1,000 Units by mouth daily.    gabapentin (NEURONTIN)  100 MG capsule Take 1 capsule (100 mg total) by mouth 3 (three) times daily. Qty: 270 capsule, Refills: 0    Multiple Vitamins-Minerals (MULTIVITAMIN WITH MINERALS) tablet Take 1 tablet by mouth daily.    pantoprazole (PROTONIX) 40 MG tablet TAKE 1 TABLET BY MOUTH DAILY Qty: 30 tablet, Refills: 3    Probiotic Product (PROBIOTIC PO) Take 1 tablet by mouth daily.    traMADol (ULTRAM) 50 MG tablet Take 1 tablet (50 mg total) by mouth every 12 (twelve) hours as needed. Qty: 60 tablet, Refills: 0    traZODone (DESYREL) 50 MG tablet Take  0.5-1 tablets (25-50 mg total) by mouth at bedtime as needed for sleep. Qty: 30 tablet, Refills: 1      STOP taking these medications     furosemide (LASIX) 20 MG tablet      nadolol (CORGARD) 40 MG tablet      spironolactone (ALDACTONE) 25 MG tablet      nitrofurantoin, macrocrystal-monohydrate, (MACROBID) 100 MG capsule      sulfamethoxazole-trimethoprim (BACTRIM DS,SEPTRA DS) 800-160 MG tablet        Allergies  Allergen Reactions  . Penicillins Rash    Has patient had a PCN reaction causing immediate rash, facial/tongue/throat swelling, SOB or lightheadedness with hypotension: Yes Has patient had a PCN reaction causing severe rash involving mucus membranes or skin necrosis: No Has patient had a PCN reaction that required hospitalization No Has patient had a PCN reaction occurring within the last 10 years: No If all of the above answers are "NO", then may proceed with Cephalosporin use.     The results of significant diagnostics from this hospitalization (including imaging, microbiology, ancillary and laboratory) are listed below for reference.    Significant Diagnostic Studies: Ct Abdomen Pelvis Wo Contrast  03/10/2015  CLINICAL DATA:  Central abdominal pain from epigastric region to back, history of Crohn's disease and colon resection with ileostomy EXAM: CT ABDOMEN AND PELVIS WITHOUT CONTRAST TECHNIQUE: Multidetector CT imaging of the abdomen and pelvis was performed following the standard protocol without IV contrast. COMPARISON:  06/10/2014 FINDINGS: Lower chest:  Clear Hepatobiliary: Status post cholecystectomy.  Tips stent noted. Pancreas: Normal Spleen: Normal Adrenals/Urinary Tract: Negative Stomach/Bowel: Stomach is decompressed but otherwise normal. There is mild diffuse wall thickening of the duodenum and proximal jejunum, with these loops mildly dilated to a diameter of about 3.7 cm. There is an anterior abdominal wall hernia to the right of midline above the umbilicus  involving the junction of proximal and mid jejunum. Beyond this, jejunum and ileum are relatively decompressed. Oral contrast extends throughout small bowel and into right lower abdominal ileostomy. Status post colectomy with decompressed sigmoid colon and rectum. Vascular/Lymphatic: No acute abnormalities Reproductive: No acute findings Other: No ascites Musculoskeletal: No acute findings IMPRESSION: Mild incomplete proximal small bowel obstruction related to anterior abdominal wall hernia. Oral contrast extends beyond this into ileostomy in there is no definite evidence of strangulation. Electronically Signed   By: Esperanza Heir M.D.   On: 03/10/2015 16:49   Dg Chest 2 View  03/10/2015  CLINICAL DATA:  Upper abdominal pain for 2 weeks, back pain EXAM: CHEST  2 VIEW COMPARISON:  07/02/2014 FINDINGS: Cardiomediastinal silhouette is stable. No acute infiltrate or pleural effusion. No pulmonary edema. Mild degenerative changes lower thoracic spine. IMPRESSION: No active cardiopulmonary disease. Electronically Signed   By: Natasha Mead M.D.   On: 03/10/2015 13:52    Microbiology: Recent Results (from the past 240 hour(s))  Urine culture  Status: None   Collection Time: 03/10/15 12:50 PM  Result Value Ref Range Status   Specimen Description URINE, CLEAN CATCH  Final   Special Requests NONE  Final   Culture   Final    MULTIPLE SPECIES PRESENT, SUGGEST RECOLLECTION Performed at [facility]    Report Status 03/12/2015 FINAL  Final  MRSA PCR Screening     Status: None   Collection Time: 03/10/15 10:25 PM  Result Value Ref Range Status   MRSA by PCR NEGATIVE NEGATIVE Final    Comment:        The GeneXpert MRSA Assay (FDA approved for NASAL specimens only), is one component of a comprehensive MRSA colonization surveillance program. It is not intended to diagnose MRSA infection nor to guide or monitor treatment for MRSA infections.      Labs: Basic Metabolic Panel:  Recent  Labs Lab 03/10/15 1308 03/11/15 0129 03/12/15 0606  NA 131* 133* 131*  K 3.5 3.6 3.8  CL 108 112* 111  CO2 17* 14* 15*  GLUCOSE 120* 74 121*  BUN 39* 35* 40*  CREATININE 2.73* 2.27* 2.25*  CALCIUM 8.6* 7.8* 8.1*   Liver Function Tests:  Recent Labs Lab 03/10/15 1308 03/11/15 0129 03/12/15 0606  AST 34 28 25  ALT 26 21 19   ALKPHOS 183* 129* 127*  BILITOT 1.5* 1.4* 1.2  PROT 5.5* 4.9* 4.4*  ALBUMIN 2.9* 2.6* 2.3*    Recent Labs Lab 03/10/15 1308 03/11/15 0129  LIPASE 98* 59*   No results for input(s): AMMONIA in the last 168 hours. CBC:  Recent Labs Lab 03/10/15 1308 03/10/15 2220 03/11/15 0129 03/12/15 0606  WBC 8.2 7.9 8.3 5.9  NEUTROABS 5.1  --   --   --   HGB 11.5* 10.2* 10.2* 9.7*  HCT 34.1* 30.1* 30.3* 28.0*  MCV 89.5 90.1 91.0 90.6  PLT 113* 97* 102* 79*   Cardiac Enzymes: No results for input(s): CKTOTAL, CKMB, CKMBINDEX, TROPONINI in the last 168 hours. BNP: BNP (last 3 results) No results for input(s): BNP in the last 8760 hours.  ProBNP (last 3 results) No results for input(s): PROBNP in the last 8760 hours.  CBG: No results for input(s): GLUCAP in the last 168 hours.  Principal Problem:   Partial small bowel obstruction (HCC) Active Problems:   Hypertension   Crohn's disease (HCC)   Hepatic cirrhosis (HCC)   GERD (gastroesophageal reflux disease)   Peripheral neuropathy (HCC)   Thrombocytopenia (HCC)   CKD (chronic kidney disease), stage III   Iron deficiency   Acute on chronic renal failure (HCC)   AP (abdominal pain)   Malnutrition of moderate degree   Hypotension   Time coordinating discharge: >30 minutes  Signed: Reuben Likes Resident Physician 03/12/2015, 1:19 PM

## 2015-03-12 NOTE — Care Management Note (Signed)
Case Management Note  Patient Details  Name: Kathryn [name] MRN: [id number] Date of Birth: Nov 23, 1955  Subjective/Objective:                  Admitted with SBO. Pt is from home with family. Pt is ind with ADLs, no HH or DME prior to admission. PT has recommended OP PT at DC. Pt is agreeable and has requested any facility in [place], [place]. Pt referral faxed to [facility] Physical Therapy dept.   Action/Plan: Pt DC home today with self care. No CM needs.   Expected Discharge Date:    03/12/2015              Expected Discharge Plan:  Home/Self Care  In-House Referral:  NA  Discharge planning Services  CM Consult  Post Acute Care Choice:  NA Choice offered to:  NA  DME Arranged:    DME Agency:     HH Arranged:    HH Agency:     Status of Service:  Completed, signed off  Medicare Important Message Given:    Date Medicare IM Given:    Medicare IM give by:    Date Additional Medicare IM Given:    Additional Medicare Important Message give by:     If discussed at Long Length of Stay Meetings, dates discussed:    Additional Comments:  Malcolm Metro, RN 03/12/2015, 1:38 PM

## 2015-03-12 NOTE — Evaluation (Signed)
Physical Therapy Evaluation Patient Details Name: Kathryn [name] MRN: [id number] DOB: 1956-01-28 Today's Date: 03/12/2015   History of Present Illness  60yo white female who comes to [facility] after worsening ABD and back pain: pt found to have a partial SBO. PMH: Crohn's disease status post colectomy and ileostomy creation, cirrhosis of the liver, hepatitis C, hypertension, chronic anemia with thrombocytopenia requiring iron transfusions. At baseline is fully indep, but reports a gradual decline in overalls trength and functional capacity over the last 6-12 months.   Clinical Impression  Pt received sleeping in room, but is easily aroused and is agreeable to PT evaluation. No acute distress noted. Pt reporting to feel 20% weaker when compared to baseline (2 weeks PTA) however states that she has noticed an overall decline in strength, activity tolerance, and balance in the past 6-12 months. Today Pt demonstrating impaired balance as evidenced by score on Berg Balance Test, gait speed is slow, only slightly faster than what is typical for household ambulation. Pt will benefit from skilled PT intervention to address the above deficits and limitations, to restore PLOF in strength, reduce falls risk, and improve quality of life. Pt is safe for DC to home with out patient PT services recommended, discussed, and pt is agreeable. No equipment needed at this time. All PT needs can be met at the next venue of care.     Follow Up Recommendations Outpatient PT    Equipment Recommendations  None recommended by PT    Recommendations for Other Services       Precautions / Restrictions Precautions Precautions: None Restrictions Weight Bearing Restrictions: No      Mobility  Bed Mobility Overal bed mobility: Needs Assistance Bed Mobility: Sit to Supine;Supine to Sit     Supine to sit: Supervision Sit to supine: Supervision   General bed mobility comments: requires supervision due to IV line.    Transfers Overall transfer level: Modified independent Equipment used: None                Ambulation/Gait Ambulation/Gait assistance: Supervision Ambulation Distance (Feet): 300 Feet Assistive device: None   Gait velocity: .63ms Gait velocity interpretation: Below normal speed for age/gender    Stairs            Wheelchair Mobility    Modified Rankin (Stroke Patients Only)       Balance Overall balance assessment: Modified Independent;History of Falls                               Standardized Balance Assessment Standardized Balance Assessment : Berg Balance Test Berg Balance Test Sit to Stand: Able to stand  independently using hands Standing Unsupported: Able to stand safely 2 minutes Sitting with Back Unsupported but Feet Supported on Floor or Stool: Able to sit safely and securely 2 minutes Stand to Sit: Sits safely with minimal use of hands Transfers: Able to transfer safely, minor use of hands Standing Unsupported with Eyes Closed: Able to stand 10 seconds safely Standing Ubsupported with Feet Together: Able to place feet together independently and stand 1 minute safely From Standing, Reach Forward with Outstretched Arm: Can reach confidently >25 cm (10") From Standing Position, Pick up Object from Floor: Able to pick up shoe safely and easily From Standing Position, Turn to Look Behind Over each Shoulder: Looks behind from both sides and weight shifts well Turn 360 Degrees: Able to turn 360 degrees safely one side only in  4 seconds or less Standing Unsupported, Alternately Place Feet on Step/Stool: Able to stand independently and safely and complete 8 steps in 20 seconds Standing Unsupported, One Foot in Front: Able to take small step independently and hold 30 seconds Standing on One Leg: Tries to lift leg/unable to hold 3 seconds but remains standing independently Total Score: 49         Pertinent Vitals/Pain Pain Assessment:  No/denies pain    Home Living Family/patient expects to be discharged to:: Private residence Living Arrangements: Spouse/significant other Available Help at Discharge: Family Type of Home: House Home Access: Stairs to enter Entrance Stairs-Rails: Right Entrance Stairs-Number of Steps: 2 Home Layout: One level Home Equipment: None      Prior Function Level of Independence: Independent               Hand Dominance        Extremity/Trunk Assessment   Upper Extremity Assessment: Generalized weakness;Overall [facility] for tasks assessed           Lower Extremity Assessment: Generalized weakness;Overall [facility] for tasks assessed      Cervical / Trunk Assessment: Kyphotic  Communication   Communication: No difficulties  Cognition Arousal/Alertness: Awake/alert Behavior During Therapy: WFL for tasks assessed/performed Overall Cognitive Status: Within Functional Limits for tasks assessed                      General Comments      Exercises        Assessment/Plan    PT Assessment All further PT needs can be met in the next venue of care  PT Diagnosis Generalized weakness;Difficulty walking   PT Problem List Decreased strength;Decreased activity tolerance;Decreased balance;Obesity  PT Treatment Interventions     PT Goals (Current goals can be found in the Care Plan section) Acute Rehab PT Goals Patient Stated Goal: Regain functional capacity and general strength with work related activities and IADL.  PT Goal Formulation: With patient Time For Goal Achievement: 03/26/15 Potential to Achieve Goals: Fair    Frequency     Barriers to discharge        Co-evaluation               End of Session Equipment Utilized During Treatment: Gait belt Activity Tolerance: Patient tolerated treatment well;No increased pain Patient left: in bed;with call bell/phone within reach           Time: 1135-1150 PT Time Calculation (min) (ACUTE ONLY): 15  min   Charges:   PT Evaluation $PT Eval Low Complexity: 1 Procedure PT Treatments $Therapeutic Activity: 8-22 mins   PT G Codes:        12:45 PM, 04-01-15 Etta Grandchild, PT, DPT PRN Physical Therapist at [place] License # [id number] [phone] (wireless)  [phone] (mobile)

## 2015-03-12 NOTE — Progress Notes (Signed)
  Subjective: Denies any abdominal pain, nausea, vomiting.  Tolerating diet well.  Objective: Vital signs in last 24 hours: Temp:  [97.9 F (36.6 C)-98.4 F (36.9 C)] 98.4 F (36.9 C) (02/02 0300) Pulse Rate:  [71-76] 76 (02/01 2044) Resp:  [20-21] 20 (02/02 0300) BP: (95-98)/(40-47) 95/47 mmHg (02/01 2044) SpO2:  [94 %-100 %] 100 % (02/02 0300) Weight:  [99.882 kg (220 lb 3.2 oz)] 99.882 kg (220 lb 3.2 oz) (02/02 0300) Last BM Date: 03/10/15  Intake/Output from previous day: 02/01 0701 - 02/02 0700 In: 2105 [P.O.:1080; I.V.:1025] Out: -  Intake/Output this shift:    General appearance: alert, cooperative and no distress GI: Soft, nontender, nondistended. Ileostomy actively patent. No incarcerated hernia present.  Lab Results:   Recent Labs  03/11/15 0129 03/12/15 0606  WBC 8.3 5.9  HGB 10.2* 9.7*  HCT 30.3* 28.0*  PLT 102* 79*   BMET  Recent Labs  03/11/15 0129 03/12/15 0606  NA 133* 131*  K 3.6 3.8  CL 112* 111  CO2 14* 15*  GLUCOSE 74 121*  BUN 35* 40*  CREATININE 2.27* 2.25*  CALCIUM 7.8* 8.1*   PT/INR  Recent Labs  03/11/15 0129  LABPROT 19.7*  INR 1.67*    Studies/Results: Ct Abdomen Pelvis Wo Contrast  03/10/2015  CLINICAL DATA:  Central abdominal pain from epigastric region to back, history of Crohn's disease and colon resection with ileostomy EXAM: CT ABDOMEN AND PELVIS WITHOUT CONTRAST TECHNIQUE: Multidetector CT imaging of the abdomen and pelvis was performed following the standard protocol without IV contrast. COMPARISON:  06/10/2014 FINDINGS: Lower chest:  Clear Hepatobiliary: Status post cholecystectomy.  Tips stent noted. Pancreas: Normal Spleen: Normal Adrenals/Urinary Tract: Negative Stomach/Bowel: Stomach is decompressed but otherwise normal. There is mild diffuse wall thickening of the duodenum and proximal jejunum, with these loops mildly dilated to a diameter of about 3.7 cm. There is an anterior abdominal wall hernia to the right  of midline above the umbilicus involving the junction of proximal and mid jejunum. Beyond this, jejunum and ileum are relatively decompressed. Oral contrast extends throughout small bowel and into right lower abdominal ileostomy. Status post colectomy with decompressed sigmoid colon and rectum. Vascular/Lymphatic: No acute abnormalities Reproductive: No acute findings Other: No ascites Musculoskeletal: No acute findings IMPRESSION: Mild incomplete proximal small bowel obstruction related to anterior abdominal wall hernia. Oral contrast extends beyond this into ileostomy in there is no definite evidence of strangulation. Electronically Signed   By: Esperanza Heir M.D.   On: 03/10/2015 16:49   Dg Chest 2 View  03/10/2015  CLINICAL DATA:  Upper abdominal pain for 2 weeks, back pain EXAM: CHEST  2 VIEW COMPARISON:  07/02/2014 FINDINGS: Cardiomediastinal silhouette is stable. No acute infiltrate or pleural effusion. No pulmonary edema. Mild degenerative changes lower thoracic spine. IMPRESSION: No active cardiopulmonary disease. Electronically Signed   By: Natasha Mead M.D.   On: 03/10/2015 13:52    Anti-infectives: Anti-infectives    None      Assessment/Plan: Impression: Partial small bowel obstruction secondary to incisional hernia, resolved Plan: Will follow peripherally with you. No need for repair at this time.  LOS: 2 days    Emmitte Surgeon A 03/12/2015

## 2015-03-26 ENCOUNTER — Emergency Department (HOSPITAL_COMMUNITY): Payer: Medicare HMO

## 2015-03-26 ENCOUNTER — Encounter (HOSPITAL_COMMUNITY): Payer: Self-pay

## 2015-03-26 ENCOUNTER — Inpatient Hospital Stay (HOSPITAL_COMMUNITY)
Admission: EM | Admit: 2015-03-26 | Discharge: 2015-03-27 | DRG: 442 | Disposition: A | Discharge status: Home / Self Care | Payer: Medicare HMO | Attending: Family Medicine | Admitting: Family Medicine

## 2015-03-26 DIAGNOSIS — D509 Iron deficiency anemia, unspecified: Secondary | ICD-10-CM | POA: Diagnosis present

## 2015-03-26 DIAGNOSIS — I129 Hypertensive chronic kidney disease with stage 1 through stage 4 chronic kidney disease, or unspecified chronic kidney disease: Secondary | ICD-10-CM | POA: Diagnosis present

## 2015-03-26 DIAGNOSIS — Z823 Family history of stroke: Secondary | ICD-10-CM

## 2015-03-26 DIAGNOSIS — D638 Anemia in other chronic diseases classified elsewhere: Secondary | ICD-10-CM | POA: Diagnosis present

## 2015-03-26 DIAGNOSIS — D696 Thrombocytopenia, unspecified: Secondary | ICD-10-CM | POA: Diagnosis present

## 2015-03-26 DIAGNOSIS — R410 Disorientation, unspecified: Secondary | ICD-10-CM | POA: Diagnosis present

## 2015-03-26 DIAGNOSIS — N183 Chronic kidney disease, stage 3 unspecified: Secondary | ICD-10-CM | POA: Diagnosis present

## 2015-03-26 DIAGNOSIS — E878 Other disorders of electrolyte and fluid balance, not elsewhere classified: Secondary | ICD-10-CM | POA: Diagnosis present

## 2015-03-26 DIAGNOSIS — D6959 Other secondary thrombocytopenia: Secondary | ICD-10-CM | POA: Diagnosis present

## 2015-03-26 DIAGNOSIS — K7682 Hepatic encephalopathy: Secondary | ICD-10-CM | POA: Diagnosis present

## 2015-03-26 DIAGNOSIS — K7200 Acute and subacute hepatic failure without coma: Principal | ICD-10-CM | POA: Diagnosis present

## 2015-03-26 DIAGNOSIS — K5090 Crohn's disease, unspecified, without complications: Secondary | ICD-10-CM | POA: Diagnosis present

## 2015-03-26 DIAGNOSIS — I10 Essential (primary) hypertension: Secondary | ICD-10-CM | POA: Diagnosis present

## 2015-03-26 DIAGNOSIS — D631 Anemia in chronic kidney disease: Secondary | ICD-10-CM | POA: Diagnosis present

## 2015-03-26 DIAGNOSIS — K7290 Hepatic failure, unspecified without coma: Secondary | ICD-10-CM | POA: Diagnosis not present

## 2015-03-26 DIAGNOSIS — K7460 Unspecified cirrhosis of liver: Secondary | ICD-10-CM | POA: Diagnosis present

## 2015-03-26 DIAGNOSIS — Z809 Family history of malignant neoplasm, unspecified: Secondary | ICD-10-CM | POA: Diagnosis not present

## 2015-03-26 DIAGNOSIS — Z87440 Personal history of urinary (tract) infections: Secondary | ICD-10-CM

## 2015-03-26 DIAGNOSIS — B182 Chronic viral hepatitis C: Secondary | ICD-10-CM | POA: Diagnosis present

## 2015-03-26 DIAGNOSIS — R0600 Dyspnea, unspecified: Secondary | ICD-10-CM

## 2015-03-26 DIAGNOSIS — N390 Urinary tract infection, site not specified: Secondary | ICD-10-CM | POA: Diagnosis present

## 2015-03-26 DIAGNOSIS — E871 Hypo-osmolality and hyponatremia: Secondary | ICD-10-CM | POA: Diagnosis present

## 2015-03-26 DIAGNOSIS — K50918 Crohn's disease, unspecified, with other complication: Secondary | ICD-10-CM | POA: Diagnosis not present

## 2015-03-26 LAB — URINALYSIS, ROUTINE W REFLEX MICROSCOPIC
Bilirubin Urine: NEGATIVE
GLUCOSE, UA: NEGATIVE mg/dL
KETONES UR: NEGATIVE mg/dL
Nitrite: NEGATIVE
PROTEIN: NEGATIVE mg/dL
Specific Gravity, Urine: 1.01 (ref 1.005–1.030)
pH: 5.5 (ref 5.0–8.0)

## 2015-03-26 LAB — CBC WITH DIFFERENTIAL/PLATELET
BASOS ABS: 0 10*3/uL (ref 0.0–0.1)
Basophils Relative: 1 %
Eosinophils Absolute: 0.3 10*3/uL (ref 0.0–0.7)
Eosinophils Relative: 5 %
HEMATOCRIT: 30.4 % — AB (ref 36.0–46.0)
HEMOGLOBIN: 10.3 g/dL — AB (ref 12.0–15.0)
LYMPHS ABS: 1.5 10*3/uL (ref 0.7–4.0)
LYMPHS PCT: 30 %
MCH: 31.2 pg (ref 26.0–34.0)
MCHC: 33.9 g/dL (ref 30.0–36.0)
MCV: 92.1 fL (ref 78.0–100.0)
MONO ABS: 0.6 10*3/uL (ref 0.1–1.0)
Monocytes Relative: 12 %
NEUTROS ABS: 2.7 10*3/uL (ref 1.7–7.7)
Neutrophils Relative %: 52 %
Platelets: 80 10*3/uL — ABNORMAL LOW (ref 150–400)
RBC: 3.3 MIL/uL — ABNORMAL LOW (ref 3.87–5.11)
RDW: 16.7 % — ABNORMAL HIGH (ref 11.5–15.5)
SMEAR REVIEW: DECREASED
WBC: 5.1 10*3/uL (ref 4.0–10.5)

## 2015-03-26 LAB — PROTIME-INR
INR: 1.4 (ref 0.00–1.49)
PROTHROMBIN TIME: 17.3 s — AB (ref 11.6–15.2)

## 2015-03-26 LAB — COMPREHENSIVE METABOLIC PANEL
ALBUMIN: 2.8 g/dL — AB (ref 3.5–5.0)
ALT: 21 U/L (ref 14–54)
ANION GAP: 4 — AB (ref 5–15)
AST: 26 U/L (ref 15–41)
Alkaline Phosphatase: 171 U/L — ABNORMAL HIGH (ref 38–126)
BILIRUBIN TOTAL: 1.1 mg/dL (ref 0.3–1.2)
BUN: 23 mg/dL — ABNORMAL HIGH (ref 6–20)
CHLORIDE: 113 mmol/L — AB (ref 101–111)
CO2: 17 mmol/L — AB (ref 22–32)
Calcium: 8.3 mg/dL — ABNORMAL LOW (ref 8.9–10.3)
Creatinine, Ser: 1.4 mg/dL — ABNORMAL HIGH (ref 0.44–1.00)
GFR calc Af Amer: 47 mL/min — ABNORMAL LOW (ref >60)
GFR calc non Af Amer: 41 mL/min — ABNORMAL LOW (ref >60)
GLUCOSE: 113 mg/dL — AB (ref 65–99)
POTASSIUM: 3.8 mmol/L (ref 3.5–5.1)
SODIUM: 134 mmol/L — AB (ref 135–145)
TOTAL PROTEIN: 5.4 g/dL — AB (ref 6.5–8.1)

## 2015-03-26 LAB — URINE MICROSCOPIC-ADD ON

## 2015-03-26 LAB — TROPONIN I: Troponin I: <0.03 ng/mL (ref <0.031)

## 2015-03-26 LAB — BRAIN NATRIURETIC PEPTIDE: B NATRIURETIC PEPTIDE 5: 74 pg/mL (ref 0.0–100.0)

## 2015-03-26 LAB — AMMONIA
AMMONIA: 177 umol/L — AB (ref 9–35)
AMMONIA: 70 umol/L — AB (ref 9–35)

## 2015-03-26 LAB — GLUCOSE, CAPILLARY: GLUCOSE-CAPILLARY: 160 mg/dL — AB (ref 65–99)

## 2015-03-26 LAB — APTT: aPTT: 34 seconds (ref 24–37)

## 2015-03-26 LAB — CBG MONITORING, ED: GLUCOSE-CAPILLARY: 129 mg/dL — AB (ref 65–99)

## 2015-03-26 MED ORDER — HEPARIN SODIUM (PORCINE) 5000 UNIT/ML IJ SOLN
5000.0000 [IU] | Freq: Three times a day (TID) | INTRAMUSCULAR | Status: DC
  Administered 2015-03-26: 5000 [IU] via SUBCUTANEOUS
  Filled 2015-03-26: qty 1

## 2015-03-26 MED ORDER — ALBUTEROL SULFATE (2.5 MG/3ML) 0.083% IN NEBU: 5.0000 mg | INHALATION_SOLUTION | Freq: Once | RESPIRATORY_TRACT | Status: DC

## 2015-03-26 MED ORDER — SODIUM CHLORIDE 0.9% FLUSH
3.0000 mL | Freq: Two times a day (BID) | INTRAVENOUS | Status: DC
  Administered 2015-03-26 – 2015-03-27 (×3): 3 mL via INTRAVENOUS

## 2015-03-26 MED ORDER — ONDANSETRON HCL 4 MG PO TABS: 4.0000 mg | ORAL_TABLET | Freq: Four times a day (QID) | ORAL | Status: DC | PRN

## 2015-03-26 MED ORDER — PANTOPRAZOLE SODIUM 40 MG PO TBEC
40.0000 mg | DELAYED_RELEASE_TABLET | Freq: Every day | ORAL | Status: DC
  Administered 2015-03-26 – 2015-03-27 (×2): 40 mg via ORAL
  Filled 2015-03-26 (×2): qty 1

## 2015-03-26 MED ORDER — CIPROFLOXACIN IN D5W 400 MG/200ML IV SOLN
400.0000 mg | Freq: Once | INTRAVENOUS | Status: AC
  Administered 2015-03-26: 400 mg via INTRAVENOUS
  Filled 2015-03-26: qty 200

## 2015-03-26 MED ORDER — ALPRAZOLAM 1 MG PO TABS: 1.0000 mg | ORAL_TABLET | Freq: Every evening | ORAL | Status: DC | PRN

## 2015-03-26 MED ORDER — ADULT MULTIVITAMIN W/MINERALS CH
1.0000 | ORAL_TABLET | Freq: Every day | ORAL | Status: DC
  Administered 2015-03-26 – 2015-03-27 (×2): 1 via ORAL
  Filled 2015-03-26 (×2): qty 1

## 2015-03-26 MED ORDER — LACTULOSE 10 GM/15ML PO SOLN
20.0000 g | Freq: Three times a day (TID) | ORAL | Status: DC
  Administered 2015-03-26 – 2015-03-27 (×5): 20 g via ORAL
  Filled 2015-03-26 (×4): qty 30

## 2015-03-26 MED ORDER — IPRATROPIUM BROMIDE 0.02 % IN SOLN: 0.5000 mg | Freq: Once | RESPIRATORY_TRACT | Status: DC

## 2015-03-26 MED ORDER — TRAZODONE HCL 50 MG PO TABS
25.0000 mg | ORAL_TABLET | Freq: Every evening | ORAL | Status: DC | PRN
  Administered 2015-03-26: 50 mg via ORAL
  Filled 2015-03-26: qty 1

## 2015-03-26 MED ORDER — SODIUM CHLORIDE 0.9% FLUSH: 3.0000 mL | INTRAVENOUS | Status: DC | PRN

## 2015-03-26 MED ORDER — CIPROFLOXACIN HCL 250 MG PO TABS
500.0000 mg | ORAL_TABLET | Freq: Two times a day (BID) | ORAL | Status: DC
  Administered 2015-03-26 – 2015-03-27 (×2): 500 mg via ORAL
  Filled 2015-03-26 (×2): qty 2

## 2015-03-26 MED ORDER — TRAMADOL HCL 50 MG PO TABS: 50.0000 mg | ORAL_TABLET | Freq: Two times a day (BID) | ORAL | Status: DC | PRN

## 2015-03-26 MED ORDER — RISAQUAD PO CAPS
ORAL_CAPSULE | Freq: Every day | ORAL | Status: DC
  Administered 2015-03-26 – 2015-03-27 (×2): 1 via ORAL
  Filled 2015-03-26 (×3): qty 1

## 2015-03-26 MED ORDER — ONDANSETRON HCL 4 MG/2ML IJ SOLN: 4.0000 mg | Freq: Four times a day (QID) | INTRAMUSCULAR | Status: DC | PRN

## 2015-03-26 MED ORDER — IPRATROPIUM-ALBUTEROL 0.5-2.5 (3) MG/3ML IN SOLN
3.0000 mL | Freq: Once | RESPIRATORY_TRACT | Status: AC
  Administered 2015-03-26: 3 mL via RESPIRATORY_TRACT
  Filled 2015-03-26: qty 3

## 2015-03-26 MED ORDER — SODIUM CHLORIDE 0.9 % IV SOLN: 250.0000 mL | INTRAVENOUS | Status: DC | PRN

## 2015-03-26 MED ORDER — POLYETHYLENE GLYCOL 3350 17 G PO PACK: 17.0000 g | PACK | Freq: Every day | ORAL | Status: DC | PRN

## 2015-03-26 MED ORDER — ALBUTEROL SULFATE (2.5 MG/3ML) 0.083% IN NEBU
2.5000 mg | INHALATION_SOLUTION | Freq: Once | RESPIRATORY_TRACT | Status: AC
  Administered 2015-03-26: 2.5 mg via RESPIRATORY_TRACT
  Filled 2015-03-26: qty 3

## 2015-03-26 MED ORDER — VITAMIN D 1000 UNITS PO TABS
1000.0000 [IU] | ORAL_TABLET | Freq: Every day | ORAL | Status: DC
  Administered 2015-03-26 – 2015-03-27 (×2): 1000 [IU] via ORAL
  Filled 2015-03-26 (×5): qty 1

## 2015-03-26 NOTE — Progress Notes (Signed)
PROGRESS NOTE  Kathryn [name] ION:[id number] DOB: Jan 08, 1956 DOA: 03/26/2015 PCP: Kathryn Rodney, Kathryn [name]  Summary: 60 y.o. female with PMH of Crohn's disease and apparent remission, liver cirrhosis secondary to hepatitis C, hypertension, and iron deficiency anemia who presents to the ED with confusion and reported dyspnea. While in the ED, CXR was unremarkable. Labs revealed a ammonia level of 177. She was admitted for further management of hepatic encephalopathy.   Assessment/Plan: 1. Acute hepatic encephalopathy. Improved. Ammonia level 70 following lactulose.  2. Possible UTI, bacteruria noted on UA. 3. Cirrhosis with associated thrombocytopenia secondary to chronic Hep C, treated with Harvoni. S/p TIPS 10/15.  4. CKD stage III. Creatinine slightly increased from baseline.  5. Thrombocytopenia, chronic. Related to cirrhosis. Platelets 80.  6. Iron-deficiency anemia, chronic. Managed with iron infusions through Dr. [name]. No signs of active bleeding. 7. Hyponatremia, chronic. Likely related to cirrhosis.  8. Crohn's disease, followed by Dr. [name] as outpatient. Continue management.  9. Essential HTN, stable. Continue home meds.    Overall improved. Continue Lactulose.   Follow up UC.   Repeat ammonia level, CBC, and BMP in am.   Anticipate discharge tomorrow if continues to improve  Code Status: Full DVT prophylaxis: Heparin  Family Communication: no family at bedside. Disposition Plan: Anticipate discharge tomorrow if continues to improve.   Brendia Sacks, MD  [facility]  Pager [phone] If 7PM-7AM, please contact night-coverage at [email or web address], password [facility] 03/26/2015, 7:47 AM  LOS: 0 days   Consultants:    Procedures:    Antibiotics:    HPI/Subjective:   Has some nausea but denies any pain or vomiting. Patient states she feels much improved from earlier. Her confusion and breathing are much better. Has had multiple bowel movements. Denies  any urinary symptoms but has a hx of UTIs.   Objective: Filed Vitals:   03/26/15 0330 03/26/15 0345 03/26/15 0500 03/26/15 0622  BP: 113/41  109/52 112/45  Pulse: 77  73 73  Temp:      TempSrc:      Resp: Height:      Weight:      SpO2: 100% 98% 94% 97%   No intake or output data in the 24 hours ending 03/26/15 0747   Filed Weights   03/26/15 0230  Weight: 95.255 kg (210 lb)    Exam:     VSS, afebrile General:  Appears calm and comfortable Cardiovascular: RRR, no m/r/g. No LE edema. Respiratory: CTA bilaterally, no w/r/r. Normal respiratory effort. Abdomen: soft, ntnd Musculoskeletal: grossly normal tone BUE/BLE Psychiatric: grossly normal mood and affect, speech fluent and appropriate. Oriented to self, location, month, and year.  Neurologic: grossly non-focal.  New data reviewed:  Ammonia down to 70  CBG stable  Pertinent data since admission:  BUN 23, Creatinine 1.40  Ammonia 177  Troponin negative  BNP 74   Scheduled Meds: . acidophilus   Oral Daily  . Cholecalciferol  1,000 Units Oral Daily  . lactulose  20 g Oral TID  . multivitamin with minerals  1 tablet Oral Daily  . pantoprazole  40 mg Oral Daily  . sodium chloride flush  3 mL Intravenous Q12H   Continuous Infusions: . sodium chloride      Principal Problem:   Hepatic encephalopathy (HCC) Active Problems:   Hypertension   Crohn's disease (HCC)   Hepatic cirrhosis (HCC)   Thrombocytopenia (HCC)   CKD (chronic kidney disease), stage III   Anemia of chronic disease  Hyponatremia   Time spent 20 minutes    By signing my name below, I, Burnett Harry attest that this documentation has been prepared under the direction and in the presence of Brendia Sacks, MD Electronically signed: Burnett Harry, Scribe.  03/26/2015 1:01pm  I personally performed the services described in this documentation. All medical record entries made by the scribe were at my direction. I  have reviewed the chart and agree that the record reflects my personal performance and is accurate and complete. Brendia Sacks, MD

## 2015-03-26 NOTE — ED Notes (Signed)
Pt reports sob since yesterday, mild nausea, no chest pain.

## 2015-03-26 NOTE — Evaluation (Signed)
Physical Therapy Evaluation Patient Details Name: Kathryn [name] MRN: [id number] DOB: Jun 30, 1955 Today's Date: 03/26/2015   History of Present Illness  60yo white female who comes to [facility] after sudden SOB and AMS: pt found to have elevated ammonia in 170's, admitted for hepatic encephalopathy. PMH: Crohn's disease status post colectomy and ileostomy creation, cirrhosis of the liver, hepatitis C, hypertension, chronic anemia with thrombocytopenia requiring iron transfusions. At baseline is fully indep, but reports a gradual decline in overalls trength and functional capacity over the last 6-12 months.  Pt was recently admitted  for SBO on 03/12/15, DC c recommendation  for OPPT, which she has recently begun on 2/13.    Clinical Impression  Pt is familiar to PT from previous visit. Pt is A&Ox4, conversational, calm, and pleasant, agreeable to PT evaluation.  Assessment of functional mobility, strength, balance, and activity tolerance area near identical to visit on 2/2. BBT today is 50/56 (49/56 on 2/2) and ambulation distance is equal, however DOE is 5/10 today with SaO2 88%, which corrects to 96% with pursed lip breathing recovery seated. Pending oxygen saturation issues, pt is appropriate and safe for return to home at DC, once medically cleared. Pt will benefit from skilled PT services to address the impairments and limitations outlined in this note. Recommending continuance of outpatient PT that was commenced on 2/13 to address the above impairments. No additional PT services needed at this time in this venue of care. PT signing off.     Follow Up Recommendations Outpatient PT (Resume c OPPT at [facility] OP. )    Equipment Recommendations  None recommended by PT    Recommendations for Other Services       Precautions / Restrictions Precautions Precautions: None Restrictions Weight Bearing Restrictions: No      Mobility  Bed Mobility Overal bed mobility: Independent Bed Mobility:  Supine to Sit;Sit to Supine     Supine to sit: Independent Sit to supine: Independent      Transfers Overall transfer level: Modified independent Equipment used: None             General transfer comment: 5x STS in 23.04s  Ambulation/Gait Ambulation/Gait assistance: Supervision Ambulation Distance (Feet): 300 Feet Assistive device: None   Gait velocity: 0.55, reports to be at baseline.  Gait velocity interpretation: <1.8 ft/sec, indicative of risk for recurrent falls    Stairs            Wheelchair Mobility    Modified Rankin (Stroke Patients Only)       Balance                                 Standardized Balance Assessment Standardized Balance Assessment : Berg Balance Test Berg Balance Test Sit to Stand: Able to stand without using hands and stabilize independently Standing Unsupported: Able to stand safely 2 minutes Sitting with Back Unsupported but Feet Supported on Floor or Stool: Able to sit safely and securely 2 minutes Stand to Sit: Sits safely with minimal use of hands Transfers: Able to transfer safely, minor use of hands Standing Unsupported with Eyes Closed: Able to stand 10 seconds safely Standing Ubsupported with Feet Together: Able to place feet together independently and stand 1 minute safely From Standing, Reach Forward with Outstretched Arm: Can reach confidently >25 cm (10") From Standing Position, Pick up Object from Floor: Able to pick up shoe safely and easily From Standing Position, Turn to  Look Behind Over each Shoulder: Looks behind from both sides and weight shifts well Turn 360 Degrees: Able to turn 360 degrees safely in 4 seconds or less Standing Unsupported, Alternately Place Feet on Step/Stool: Able to stand independently and safely and complete 8 steps in 20 seconds Standing Unsupported, One Foot in Front: Loses balance while stepping or standing Standing on One Leg: Able to lift leg independently and hold  equal to or more than 3 seconds Total Score: 50         Pertinent Vitals/Pain Pain Assessment: No/denies pain    Home Living Family/patient expects to be discharged to:: Private residence Living Arrangements: Spouse/significant other Available Help at Discharge: Family (son and daughter in Sports [place] in [place]. ) Type of Home: House Home Access: Stairs to enter Entrance Stairs-Rails: Psychiatric nurse of Steps: 2 Home Layout: One level Home Equipment: None      Prior Function Level of Independence: Independent         Comments: Limited actvity tolerance, easily fatigued with prolonged walking; reports she requires a buggy when making groceries due to gradual onset fatgiue.      Hand Dominance        Extremity/Trunk Assessment   Upper Extremity Assessment: Overall WFL for tasks assessed           Lower Extremity Assessment: Generalized weakness;Overall [facility] for tasks assessed      Cervical / Trunk Assessment: Kyphotic  Communication   Communication: No difficulties  Cognition Arousal/Alertness: Awake/alert Behavior During Therapy: WFL for tasks assessed/performed Overall Cognitive Status: Within Functional Limits for tasks assessed                      General Comments      Exercises        Assessment/Plan    PT Assessment All further PT needs can be met in the next venue of care  PT Diagnosis Generalized weakness;Difficulty walking   PT Problem List Decreased strength;Decreased activity tolerance;Decreased balance;Obesity;Decreased mobility;Cardiopulmonary status limiting activity  PT Treatment Interventions     PT Goals (Current goals can be found in the Care Plan section) Acute Rehab PT Goals Patient Stated Goal: Regain functional capacity and general strength with work related activities and IADL.  PT Goal Formulation: With patient Time For Goal Achievement: April 12, 2015 Potential to Achieve Goals: Fair    Frequency      Barriers to discharge        Co-evaluation               End of Session Equipment Utilized During Treatment: Gait belt Activity Tolerance: Patient tolerated treatment well;No increased pain Patient left: in bed;with call bell/phone within reach;with family/visitor present           Time: 0370-9643 PT Time Calculation (min) (ACUTE ONLY): 22 min   Charges:   PT Evaluation $PT Eval Low Complexity: 1 Procedure PT Treatments $Therapeutic Activity: 8-22 mins   PT G Codes:       2:31 PM, 04/12/2015 Etta Grandchild, PT, DPT PRN Physical Therapist at [place] License # [id number] [phone] (wireless)  [phone] (mobile)

## 2015-03-26 NOTE — H&P (Signed)
[facility] History and Physical  Kathryn [name] HQI:[id number] DOB: Sep 17, 1955 DOA: 03/26/2015  Referring physician: ED physician PCP: [name], FNP  Specialists: Dr. Laural Golden (GI), Dr. Whitney Muse (onc)  Chief Complaint:  SOB, confused   HPI: Kathryn [name] is a 60 y.o. female with PMH of Crohn's disease and apparent remission, liver cirrhosis secondary to hepatitis C status post successful treatment with Harvoni, hypertension, and iron deficiency anemia who presents to the ED with confusion and reported dyspnea. History is difficult to obtain as the patient is acutely encephalopathic and her husband is essentially deaf and unable to discuss the case by phone. Patient reports dyspnea, but gives conflicting information regarding onset and associated symptoms. She has history of variceal bleed, but there is been no bleeding episodes since undergoing TIPS in October 2015.Per chart review, she does not appear to have history of hepatic encephalopathy.  She was admitted to the hospital from 03/10/2015 - 03/12/2015 for management of SBO which resolved spontaneously. She does not take lactulose or rifaximin at home.  In ED, patient was found to be afebrile, saturating well on room air, and with vital signs stable.  She reported dyspnea and chest x-ray was obtained and not demonstrative of any acute cardiopulmonary disease. CMP was obtained and grossly abnormal with hyponatremia, hypochloremia, depressed bicarbonate, elevated BUN, and elevated creatinine. There is also elevation in alkaline phosphatase and albumin is low at 2.8. CBC features a stable normocytic anemia with hemoglobin of 10.3, stable chronic thrombocytopenia with platelet count of 80,000. Teardrop cells are noted on the peripheral smear. Additional labs of note include INR of 1.4 and ammonia level of 177. Patient was treated with a nebulized breathing treatment in the emergency department. She remained hemodynamically stable and will be  admitted for ongoing evaluation and management of acute encephalopathy suspected be hepatic in origin.  Where does patient live?   At home    Can patient participate in ADLs?  Yes       Review of Systems:  Unable to obtain ROS secondary to patient's clinical condition with acute encephalopathy and associated confusion.   Allergy:  Allergies  Allergen Reactions  . Penicillins Rash    Has patient had a PCN reaction causing immediate rash, facial/tongue/throat swelling, SOB or lightheadedness with hypotension: Yes Has patient had a PCN reaction causing severe rash involving mucus membranes or skin necrosis: No Has patient had a PCN reaction that required hospitalization No Has patient had a PCN reaction occurring within the last 10 years: No If all of the above answers are "NO", then may proceed with Cephalosporin use.     Past Medical History  Diagnosis Date  . Hypertension   . Enteritis presumed infectious   . Crohn's   . Anemia   . Hepatitis C antibody test positive   . Hepatitis   . Renal insufficiency   . Cirrhosis ([place])   . Splenomegaly   . Bilateral leg edema 01/2014  . Chronic back pain     Past Surgical History  Procedure Laterality Date  . Colon surgery      MULTIPLE SURGERIES FOR CROHNS  . Ileostomy      multiple abdominal surgeries for Crohn's by Dr. Anthony Sar  . Cholecystectomy    . Abscess drainage      abdominal  . Esophagogastroduodenoscopy  10/05/2010    Procedure: ESOPHAGOGASTRODUODENOSCOPY (EGD);  Surgeon: Rogene Houston, MD;  [facility];  Service: Endoscopy;  Laterality: N/A;  7:30 am  . Cirrhosis    .  Esophagogastroduodenoscopy N/A 09/11/2013    Procedure: ESOPHAGOGASTRODUODENOSCOPY (EGD);  Surgeon: Rogene Houston, MD;  [facility];  Service: Endoscopy;  Laterality: N/A;  . Ileoscopy  09/11/2013    Procedure: ILEOSCOPY THROUGH STOMA;  Surgeon: Rogene Houston, MD;  [facility];  Service: Endoscopy;;  . Givens capsule  study N/A 09/12/2013    Procedure: GIVENS CAPSULE STUDY;  Surgeon: Rogene Houston, MD;  [facility];  Service: Endoscopy;  Laterality: N/A;  . Givens capsule study N/A 11/25/2013    Procedure: GIVENS CAPSULE STUDY;  Surgeon: Rogene Houston, MD;  [facility];  Service: Endoscopy;  Laterality: N/A;  . Varices cauterization Right     in the stoma of her ileostomy  . Tips procedure  11/2013    [facility]     Social History:  reports that she has never smoked. She has never used smokeless tobacco. She reports that she does not drink alcohol or use illicit drugs.  Family History:  Family History  Problem Relation Age of Onset  . Cancer Mother   . Stroke Father      Prior to Admission medications   Medication Sig Start Date End Date Taking? Authorizing Provider  ALPRAZolam Duanne Moron) 1 MG tablet Take 1 tablet (1 mg total) by mouth at bedtime as needed. Patient taking differently: Take 1 mg by mouth at bedtime.  01/29/15   Sharion Balloon, FNP  Cholecalciferol (VITAMIN D-1000 MAX ST) 1000 UNITS tablet Take 1,000 Units by mouth daily.    Historical Provider, MD  gabapentin (NEURONTIN) 100 MG capsule Take 1 capsule (100 mg total) by mouth 3 (three) times daily. 10/23/14   Chipper Herb, MD  Multiple Vitamins-Minerals (MULTIVITAMIN WITH MINERALS) tablet Take 1 tablet by mouth daily.    Historical Provider, MD  pantoprazole (PROTONIX) 40 MG tablet TAKE 1 TABLET BY MOUTH DAILY 06/09/14   Butch Penny, NP  Probiotic Product (PROBIOTIC PO) Take 1 tablet by mouth daily.    Historical Provider, MD  traMADol (ULTRAM) 50 MG tablet Take 1 tablet (50 mg total) by mouth every 12 (twelve) hours as needed. Patient taking differently: Take 50 mg by mouth every 12 (twelve) hours as needed for moderate pain.  01/13/15   Sharion Balloon, FNP  traZODone (DESYREL) 50 MG tablet Take 0.5-1 tablets (25-50 mg total) by mouth at bedtime as needed for sleep. 11/21/14   Chipper Herb, MD    Physical  Exam: Filed Vitals:   03/26/15 0330 03/26/15 0345 03/26/15 0500 03/26/15 0622  BP: 113/41  109/52 112/45  Pulse: 77  73 73  Temp:      TempSrc:      Resp: _0 Height:      Weight:      SpO2: 100% 98% 94% 97%   General: Not in acute distress HEENT:       Eyes: PERRL, EOMI, no scleral icterus or conjunctival pallor.       ENT: No discharge from the ears or nose, no pharyngeal ulcers, petechiae or exudate         Neck: No JVD, no bruit, no appreciable mass Heme: No cervical adenopathy, no pallor Cardiac: Rate ~80 and regular with soft holosystolic murmur at lower sternal border, No gallops or rubs. Pulm: Good air movement bilaterally. No rales, wheezing, rhonchi or rubs. Abd: Soft, nondistended, nontender, no rebound pain or gaurding, BS present. Ext: Bilateral LE edema to knees, 2+DP/PT pulse bilaterally.  Musculoskeletal: No gross deformity, no red, hot, swollen joints   Skin: No rashes or wounds on exposed surfaces. Scattered angiomata noted   Neuro: Lethargic, arousable, oriented to person and place but not situation or year, follows commands, cranial nerves II-XII grossly intact, tremulous with finger-nose-finger test. No focal findings Psych: Patient is not overtly psychotic, but disoriented and confused .  Labs on Admission:  Basic Metabolic Panel:  Recent Labs Lab 03/26/15 0345  NA 134*  K 3.8  CL 113*  CO2 17*  GLUCOSE 113*  BUN 23*  CREATININE 1.40*  CALCIUM 8.3*   Liver Function Tests:  Recent Labs Lab 03/26/15 0345  AST 26  ALT 21  ALKPHOS 171*  BILITOT 1.1  PROT 5.4*  ALBUMIN 2.8*   No results for input(s): LIPASE, AMYLASE in the last 168 hours.  Recent Labs Lab 03/26/15 0345  AMMONIA 177*   CBC:  Recent Labs Lab 03/26/15 0345  WBC 5.1  NEUTROABS 2.7  HGB 10.3*  HCT 30.4*  MCV 92.1  PLT 80*   Cardiac Enzymes:  Recent Labs Lab 03/26/15 0345  TROPONINI <0.03    BNP (last 3 results)  Recent Labs  03/26/15 0345  BNP  74.0    ProBNP (last 3 results) No results for input(s): PROBNP in the last 8760 hours.  CBG: No results for input(s): GLUCAP in the last 168 hours.  Radiological Exams on Admission: Dg Chest Portable 1 View  03/26/2015  CLINICAL DATA:  Increasing shortness of breath with dry nonproductive cough for 1 day. History of hypertension. Smoker. EXAM: PORTABLE CHEST 1 VIEW COMPARISON:  03/10/2015 FINDINGS: The heart size and mediastinal contours are within normal limits. Both lungs are clear. The visualized skeletal structures are unremarkable. IMPRESSION: No active disease. Electronically Signed   By: Lucienne Capers M.D.   On: 03/26/2015 03:02    EKG:  Not done in ED, will obtain as appropriate   Assessment/Plan  1. Acute hepatic encephalopathy  - Underwent TIPS in October 2015; no ammonia levels in EMR since that time and no apparent admission for encephalopathy  - Ammonia is 177 on admission  - Will start treatment with PO lactulose 20 mL TID, to be titrated to achieve 3 loose stools daily  - Repeat ammonia level later today   - There is bacteruria noted on UA, but pt denies sxs of UTI; would be reasonable to treat as could potentially be contributing to encephalopathy, but will elect to bring down ammonia first and treat bacteruria if AMS persists   2. 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 CKD stage III - SCr 1.40 on admission, up from an apparent baseline of 1.2, but improved from recent AKI  - Monitor fluid status closely, intervene prn  - Avoid nephrotoxic agents where feasible   4. Thrombocytopenia, anemia  - Thrombocytopenia  stable with platelet count of 80,000; suspected secondary to cirrhosis; will hold pharmacologic VTE ppx, SCDs only for now  - Anemia has been attributed to iron-deficiency and pt managed with iron infusions through Dr. Whitney Muse of oncology; stable with Hgb 10.3 on admission  -  No sign of active bleed, monitoring    5. Hyponatremia  - Chronic and likely secondary to cirrhosis  - Mild and unlikely to contribute to her confusional state    6. Crohn's disease - Followed by Dr. Laural Golden of GI in the outpt setting  - Appears to have been stable without immunosuppression or other therapies  - Monitoring, can check ESR if concerned for active disease       DVT ppx: SQ Heparin    Code Status: Full code Family Communication: None at bed side.   Disposition Plan: Admit to inpatient   Date of Service 03/26/2015    Vianne Bulls, MD [facility] Pager [phone]  If 7PM-7AM, please contact night-coverage [email or web address] Password [facility] 03/26/2015, 6:39 AM

## 2015-03-26 NOTE — Progress Notes (Signed)
PT Cancellation Note  Patient Details Name: Kathryn [name] MRN: [id number] DOB: April 22, 1955   Cancelled Treatment:      Chart reviewed, treatment attempted. Pt has been off the floor for imaging at multiple attempts. PT eval to be attempted again at later date/time.    10:49 AM, 03/26/2015 Rosamaria Lints, PT, DPT PRN Physical Therapist at [name] [place] License # [id number] [phone] (wireless)  [phone] (mobile)

## 2015-03-26 NOTE — ED Provider Notes (Addendum)
CSN: [id number]     Arrival date & time 03/26/15  0219 History   First MD Initiated Contact with Patient 03/26/15 [phone]    Chief Complaint  Patient presents with  . Shortness of Breath   Level V caveat for confusion  (Consider location/radiation/quality/duration/timing/severity/associated sxs/prior Treatment) HPI patient reports she was admitted 3 weeks ago when she had renal failure, cirrhosis, and Crohn's flareup. Patient seems to be a little confused in  that she initially told me she felt bad yesterday, February 15 and then she told me she didn't, and then she told me that she had trouble breathing yesterday meaning the 15th. She denies cough, fever, and when asked about chest pain states "not really". She's had mild nausea without vomiting or diarrhea. She denies abdominal swelling or leg swelling.   PCP Lendon Colonel  Past Medical History  Diagnosis Date  . Hypertension   . Enteritis presumed infectious   . Crohn's   . Anemia   . Hepatitis C antibody test positive   . Hepatitis   . Renal insufficiency   . Cirrhosis ([facility])   . Splenomegaly   . Bilateral leg edema 01/2014  . Chronic back pain    Past Surgical History  Procedure Laterality Date  . Colon surgery      MULTIPLE SURGERIES FOR CROHNS  . Ileostomy      multiple abdominal surgeries for Crohn's by Dr. Gabriel Cirri  . Cholecystectomy    . Abscess drainage      abdominal  . Esophagogastroduodenoscopy  10/05/2010    Procedure: ESOPHAGOGASTRODUODENOSCOPY (EGD);  Surgeon: [name], MD;  [facility];  Service: Endoscopy;  Laterality: N/A;  7:30 am  . Cirrhosis    . Esophagogastroduodenoscopy N/A 09/11/2013    Procedure: ESOPHAGOGASTRODUODENOSCOPY (EGD);  Surgeon: [name], MD;  [facility];  Service: Endoscopy;  Laterality: N/A;  . Ileoscopy  09/11/2013    Procedure: ILEOSCOPY THROUGH STOMA;  Surgeon: [name], MD;  [facility];  Service: Endoscopy;;  . Givens capsule study N/A  09/12/2013    Procedure: GIVENS CAPSULE STUDY;  Surgeon: [name], MD;  [facility];  Service: Endoscopy;  Laterality: N/A;  . Givens capsule study N/A 11/25/2013    Procedure: GIVENS CAPSULE STUDY;  Surgeon: [name], MD;  [facility];  Service: Endoscopy;  Laterality: N/A;  . Varices cauterization Right     in the stoma of her ileostomy  . Tips procedure  11/2013    [facility]    Family History  Problem Relation Age of Onset  . Cancer Mother   . Stroke Father    Social History  Substance Use Topics  . Smoking status: Never Smoker   . Smokeless tobacco: Never Used  . Alcohol Use: No   Lives at home Lives with spouse  OB History    Gravida Para Term Preterm AB TAB SAB Ectopic Multiple Living            1     Review of Systems  All other systems reviewed and are negative.     Allergies  Penicillins  Home Medications   Prior to Admission medications   Medication Sig Start Date End Date Taking? Authorizing Provider  ALPRAZolam Prudy Feeler) 1 MG tablet Take 1 tablet (1 mg total) by mouth at bedtime as needed. Patient taking differently: Take 1 mg by mouth at bedtime.  01/29/15   Junie Spencer, FNP  Cholecalciferol (VITAMIN D-1000  MAX ST) 1000 UNITS tablet Take 1,000 Units by mouth daily.    Historical Provider, MD  gabapentin (NEURONTIN) 100 MG capsule Take 1 capsule (100 mg total) by mouth 3 (three) times daily. 10/23/14   Ernestina Penna, MD  Multiple Vitamins-Minerals (MULTIVITAMIN WITH MINERALS) tablet Take 1 tablet by mouth daily.    Historical Provider, MD  pantoprazole (PROTONIX) 40 MG tablet TAKE 1 TABLET BY MOUTH DAILY 06/09/14   Len Blalock, NP  Probiotic Product (PROBIOTIC PO) Take 1 tablet by mouth daily.    Historical Provider, MD  traMADol (ULTRAM) 50 MG tablet Take 1 tablet (50 mg total) by mouth every 12 (twelve) hours as needed. Patient taking differently: Take 50 mg by mouth every 12 (twelve) hours as needed for moderate pain.   01/13/15   Junie Spencer, FNP  traZODone (DESYREL) 50 MG tablet Take 0.5-1 tablets (25-50 mg total) by mouth at bedtime as needed for sleep. 11/21/14   Ernestina Penna, MD   BP 118/50 mmHg  Pulse 75  Temp(Src) 98 F (36.7 C) (Oral)  Resp 24  Ht  (1.651 m)  Wt 210 lb (95.255 kg)  BMI 34.95 kg/m2  SpO2 100%  Vital signs normal   Physical Exam  Constitutional: She is oriented to person, place, and time. She appears well-developed and well-nourished.  Non-toxic appearance. She does not appear ill. No distress.  HENT:  Head: Normocephalic and atraumatic.  Right Ear: External ear normal.  Left Ear: External ear normal.  Nose: Nose normal. No mucosal edema or rhinorrhea.  Mouth/Throat: Oropharynx is clear and moist and mucous membranes are normal. No dental abscesses or uvula swelling.  Some tremor of the tongue  Eyes: Conjunctivae and EOM are normal. Pupils are equal, round, and reactive to light.  Neck: Normal range of motion and full passive range of motion without pain. Neck supple.  Cardiovascular: Normal rate, regular rhythm and normal heart sounds.  Exam reveals no gallop and no friction rub.   No murmur heard. Pulmonary/Chest: Effort normal. No respiratory distress. She has no wheezes. She has no rhonchi. She has rales. She exhibits no tenderness and no crepitus.  Patient has a few crackles at the bases  Abdominal: Soft. Normal appearance and bowel sounds are normal. She exhibits no distension. There is no tenderness. There is no rebound and no guarding.  Musculoskeletal: Normal range of motion. She exhibits no edema or tenderness.  Moves all extremities well.   Neurological: She is alert and oriented to person, place, and time. She has normal strength. No cranial nerve deficit.  Patient knows the month, year, resident, and where she is. But in some respects she does have some confusion.  Skin: Skin is warm, dry and intact. No rash noted. No erythema. No pallor.   Psychiatric: She has a normal mood and affect. Her speech is normal and behavior is normal. Her mood appears not anxious.  Nursing note and vitals reviewed.   ED Course  Procedures (including critical care time)  Medications  ciprofloxacin (CIPRO) IVPB 400 mg (not administered)  ipratropium-albuterol (DUONEB) 0.5-2.5 (3) MG/3ML nebulizer solution 3 mL (3 mLs Nebulization Given 03/26/15 0345)  albuterol (PROVENTIL) (2.5 MG/3ML) 0.083% nebulizer solution 2.5 mg (2.5 mg Nebulization Given 03/26/15 0345)   Patient was given a nebulizer for her crackles in her lungs.  After reviewing patient's labs I felt she had hepatic encephalopathy. Talk to the patient and felt she should be admitted and she is agreeable. At this point  I am not sure about the source of her shortness of breath. However she does not appear to have pneumonia, acute myocardial ischemia, congestive heart failure.  05:10 Dr Antionette Char, hospitalist, admit to med-surg   Patient's urine came back and is positive for UTI. Urine culture was sent. She was given IV Cipro for her infection.  Labs Review Results for orders placed or performed during the hospital encounter of 03/26/15  Comprehensive metabolic panel  Result Value Ref Range   Sodium 134 (L) 135 - 145 mmol/L   Potassium 3.8 3.5 - 5.1 mmol/L   Chloride 113 (H) 101 - 111 mmol/L   CO2 17 (L) 22 - 32 mmol/L   Glucose, Bld 113 (H) 65 - 99 mg/dL   BUN 23 (H) 6 - 20 mg/dL   Creatinine, Ser 4.09 (H) 0.44 - 1.00 mg/dL   Calcium 8.3 (L) 8.9 - 10.3 mg/dL   Total Protein 5.4 (L) 6.5 - 8.1 g/dL   Albumin 2.8 (L) 3.5 - 5.0 g/dL   AST 26 15 - 41 U/L   ALT 21 14 - 54 U/L   Alkaline Phosphatase 171 (H) 38 - 126 U/L   Total Bilirubin 1.1 0.3 - 1.2 mg/dL   GFR calc non Af Amer 41 (L) >60 mL/min   GFR calc Af Amer 47 (L) >60 mL/min   Anion gap 4 (L) 5 - 15  CBC with Differential  Result Value Ref Range   WBC 5.1 4.0 - 10.5 K/uL   RBC 3.30 (L) 3.87 - 5.11 MIL/uL   Hemoglobin 10.3 (L)  12.0 - 15.0 g/dL   HCT 81.1 (L) 91.4 - 78.2 %   MCV 92.1 78.0 - 100.0 fL   MCH 31.2 26.0 - 34.0 pg   MCHC 33.9 30.0 - 36.0 g/dL   RDW 95.6 (H) 21.3 - 08.6 %   Platelets 80 (L) 150 - 400 K/uL   Neutrophils Relative % 52 %   Neutro Abs 2.7 1.7 - 7.7 K/uL   Lymphocytes Relative 30 %   Lymphs Abs 1.5 0.7 - 4.0 K/uL   Monocytes Relative 12 %   Monocytes Absolute 0.6 0.1 - 1.0 K/uL   Eosinophils Relative 5 %   Eosinophils Absolute 0.3 0.0 - 0.7 K/uL   Basophils Relative 1 %   Basophils Absolute 0.0 0.0 - 0.1 K/uL   RBC Morphology TEARDROP CELLS    Smear Review PLATELETS APPEAR DECREASED   Brain natriuretic peptide  Result Value Ref Range   B Natriuretic Peptide 74.0 0.0 - 100.0 pg/mL  Ammonia  Result Value Ref Range   Ammonia 177 (H) 9 - 35 umol/L  APTT  Result Value Ref Range   aPTT 34 24 - 37 seconds  Protime-INR  Result Value Ref Range   Prothrombin Time 17.3 (H) 11.6 - 15.2 seconds   INR 1.40 0.00 - 1.49  Urinalysis, Routine w reflex microscopic (not at [facility])  Result Value Ref Range   Color, Urine YELLOW YELLOW   APPearance CLEAR CLEAR   Specific Gravity, Urine 1.010 1.005 - 1.030   pH 5.5 5.0 - 8.0   Glucose, UA NEGATIVE NEGATIVE mg/dL   Hgb urine dipstick MODERATE (A) NEGATIVE   Bilirubin Urine NEGATIVE NEGATIVE   Ketones, ur NEGATIVE NEGATIVE mg/dL   Protein, ur NEGATIVE NEGATIVE mg/dL   Nitrite NEGATIVE NEGATIVE   Leukocytes, UA MODERATE (A) NEGATIVE  Troponin I  Result Value Ref Range   Troponin I <0.03 <0.031 ng/mL  Urine microscopic-add on  Result Value  Ref Range   Squamous Epithelial / LPF 6-30 (A) NONE SEEN   WBC, UA TOO NUMEROUS TO COUNT 0 - 5 WBC/hpf   RBC / HPF TOO NUMEROUS TO COUNT 0 - 5 RBC/hpf   Bacteria, UA MANY (A) NONE SEEN   Laboratory interpretation all normal except elevated ammonia, stable anemia, malnutrition, improving renal insufficiency, UTI   Imaging Review Dg Chest Portable 1 View  03/26/2015  CLINICAL DATA:  Increasing shortness  of breath with dry nonproductive cough for 1 day. History of hypertension. Smoker. EXAM: PORTABLE CHEST 1 VIEW COMPARISON:  03/10/2015 FINDINGS: The heart size and mediastinal contours are within normal limits. Both lungs are clear. The visualized skeletal structures are unremarkable. IMPRESSION: No active disease. Electronically Signed   By: Burman Nieves M.D.   On: 03/26/2015 03:02    Ct Abdomen Pelvis Wo Contrast  03/10/2015  CLINICAL DATA:  Central abdominal pain from epigastric region to back, history of Crohn's disease and colon resection with ileostomy  IMPRESSION: Mild incomplete proximal small bowel obstruction related to anterior abdominal wall hernia. Oral contrast extends beyond this into ileostomy in there is no definite evidence of strangulation. Electronically Signed   By: Esperanza Heir M.D.   On: 03/10/2015 16:49     I have personally reviewed and evaluated these images and lab results as part of my medical decision-making.   EKG Interpretation   Date/Time:  Thursday March 26 2015 02:34:48 EST Ventricular Rate:  74 PR Interval:  179 QRS Duration: 98 QT Interval:  412 QTC Calculation: 457 R Axis:   20 Text Interpretation:  Sinus rhythm Low voltage, extremity and precordial  leads Baseline wander No significant change since last tracing 09 Jun 2014  Confirmed by Maximus Hoffert  MD-I, Delane Wessinger ([id number]) on 03/26/2015 2:59:10 AM      MDM   Final diagnoses:  Hepatic encephalopathy (HCC)  Dyspnea  Urinary tract infection without hematuria, site unspecified   Plan admission  Devoria Albe, MD, Concha Pyo, MD 03/26/15 [id number]  Devoria Albe, MD 03/26/15 [phone]

## 2015-03-26 NOTE — ED Notes (Signed)
Room air pulse ox 97-99%.  Removed oxygen [place].  Pt no distress.

## 2015-03-27 DIAGNOSIS — N183 Chronic kidney disease, stage 3 (moderate): Secondary | ICD-10-CM

## 2015-03-27 DIAGNOSIS — K50918 Crohn's disease, unspecified, with other complication: Secondary | ICD-10-CM

## 2015-03-27 DIAGNOSIS — K7469 Other cirrhosis of liver: Secondary | ICD-10-CM

## 2015-03-27 DIAGNOSIS — D638 Anemia in other chronic diseases classified elsewhere: Secondary | ICD-10-CM

## 2015-03-27 DIAGNOSIS — D696 Thrombocytopenia, unspecified: Secondary | ICD-10-CM

## 2015-03-27 LAB — URINE CULTURE

## 2015-03-27 LAB — AMMONIA: Ammonia: 94 umol/L — ABNORMAL HIGH (ref 9–35)

## 2015-03-27 LAB — GLUCOSE, CAPILLARY: Glucose-Capillary: 106 mg/dL — ABNORMAL HIGH (ref 65–99)

## 2015-03-27 MED ORDER — ENSURE ENLIVE PO LIQD: 237.0000 mL | ORAL | Status: AC

## 2015-03-27 MED ORDER — LACTULOSE 10 GM/15ML PO SOLN: 20.0000 g | Freq: Two times a day (BID) | ORAL | Status: DC

## 2015-03-27 MED ORDER — ENSURE ENLIVE PO LIQD
237.0000 mL | ORAL | Status: DC
  Administered 2015-03-27: 237 mL via ORAL

## 2015-03-27 NOTE — Progress Notes (Signed)
PROGRESS NOTE  Kathryn [name] BFX:[id number] DOB: [birth year] DOA: 03/26/2015 PCP: [name], FNP  Summary: 60 y.o. female with PMH of Crohn's disease and apparent remission, liver cirrhosis secondary to hepatitis C, hypertension, and iron deficiency anemia who presents to the ED with confusion and reported dyspnea. While in the ED, CXR was unremarkable. Labs revealed a ammonia level of 177. She was admitted for further management of hepatic encephalopathy.   Assessment/Plan: 1. Acute hepatic encephalopathy. Clinically resolved despite mild increase in serum ammonia. Multiple bowel movements. 2. Pyuria. Urine culture without dominant organism. 3. Cirrhosis with associated thrombocytopenia and hyponatremia secondary to chronic Hep C, treated with Harvoni. S/p TIPS 10/15.  4. CKD stage III. Stable. 5. Thrombocytopenia, chronic. Secondary to cirrhosis. 6. Iron-deficiency anemia, chronic. Managed with iron infusions through Dr. [name]. No signs of active bleeding. 7. Crohn's disease, followed by Dr. [name] as outpatient. Continue management.  8. Essential HTN, stable. Continue home meds.    Overall improved. Mentation baseline. Discharge home on lactulose. We discussed titration to achieve 2-3 soft bowel movements per day.  Message sent to Dr. [name].  Discharge home today  Code Status: Full DVT prophylaxis: Heparin  Family Communication: no family at bedside. Disposition Plan: Discharge home today.   Brendia Sacks, MD  [facility]  Pager [phone] If 7PM-7AM, please contact night-coverage at [email or web address], password [facility] 03/27/2015, 7:09 AM  LOS: 1 day   Consultants:  PT- HH  Procedures:  none  Antibiotics:  none  HPI/Subjective: Feels okay. Slept well last night but does not have much of an appetite. Has some nausea but denies vomiting, pain, or SOB. Has had two large BMs today. Denies any urinary sx.  Objective: Filed Vitals:   03/26/15 1212  03/26/15 1415 03/26/15 2123 03/27/15 0427  BP: 107/52  143/63 134/60  Pulse: 69 78 83 72  Temp: 98.5 F (36.9 C)  98.8 F (37.1 C) 98.7 F (37.1 C)  TempSrc: Oral  Oral Oral  Resp:   20 18  Height:      Weight:      SpO2: 98% 88% 97% 98%    Intake/Output Summary (Last 24 hours) at 03/27/15 0709 Last data filed at 03/26/15 2126  Gross per 24 hour  Intake    240 ml  Output    700 ml  Net   -460 ml     Filed Weights   03/26/15 0230 03/26/15 1155  Weight: 95.255 kg (210 lb) 100 kg (220 lb 7.4 oz)    Exam:     VSS, afebrile General:  Appears calm and comfortable Cardiovascular: RRR, no m/r/g. 1+ BLE edema. Respiratory: CTA bilaterally, no w/r/r. Normal respiratory effort. Abdomen: soft, ntnd Psychiatric: grossly normal mood and affect, speech fluent and appropriate. Oriented to self, location, month, and year. Remembers events in room yesterday. Neurologic: grossly non-focal.   New data reviewed:  Ammonia 94  CBG stable.   Pertinent data since admission:  BUN 23, Creatinine 1.40  Ammonia 177  Troponin negative  BNP 74   Scheduled Meds: . acidophilus   Oral Daily  . Cholecalciferol  1,000 Units Oral Daily  . ciprofloxacin  500 mg Oral BID  . lactulose  20 g Oral TID  . multivitamin with minerals  1 tablet Oral Daily  . pantoprazole  40 mg Oral Daily  . sodium chloride flush  3 mL Intravenous Q12H   Continuous Infusions:    Principal Problem:   Hepatic encephalopathy (HCC) Active Problems:   Hypertension  Crohn's disease (HCC)   Hepatic cirrhosis (HCC)   Thrombocytopenia (HCC)   CKD (chronic kidney disease), stage III   Anemia of chronic disease   Hyponatremia   By signing my name below, I, Burnett Harry attest that this documentation has been prepared under the direction and in the presence of Brendia Sacks, MD Electronically signed: Burnett Harry, Scribe.  03/27/2015  1:03pm  I personally performed the services described in  this documentation. All medical record entries made by the scribe were at my direction. I have reviewed the chart and agree that the record reflects my personal performance and is accurate and complete. Brendia Sacks, MD

## 2015-03-27 NOTE — Discharge Summary (Signed)
Physician Discharge Summary  Kathryn [name] ENI:[id number] DOB: 05/20/1955 DOA: 03/26/2015  PCP: Kathryn Rodney, FNP  Admit date: 03/26/2015 Discharge date: 03/27/2015  Recommendations for Outpatient Follow-up:  1. Follow up with PCP as needed. 2. Message sent to Dr. [name] to apprise of hospitalization.    Follow-up Information    Follow up with Kathryn Rodney, FNP.   Specialty:  Family Medicine   Why:  As needed   Contact information:   [address] [place] [address] [phone]       Follow up with Kathryn Hippo, MD.   Specialty:  Gastroenterology   Contact information:   [address], [address] [place] [address] [phone]       Discharge Diagnoses:  1. Acute hepatic encephalopathy 2. Cirrhosis with associated thrombocytopenia and hyponatremia secondary to chronic Hep C, treated with Harvoni. S/p TIPS 10/15.  3. CKD stage III.  4. Iron-deficiency anemia, chronic.  5. Crohn's disease. 6. Essential HTN.  Discharge Condition: Improved Disposition: Home  Diet recommendation: Heart Healthy  Filed Weights   03/26/15 0230 03/26/15 1155  Weight: 95.255 kg (210 lb) 100 kg (220 lb 7.4 oz)    History of present illness:  60 y.o. female with PMH of Crohn's disease and apparent remission, liver cirrhosis secondary to hepatitis C, hypertension, and iron deficiency anemia who presents to the ED with confusion and reported dyspnea. While in the ED, CXR was unremarkable. Labs revealed a ammonia level of 177. She was admitted for further management of hepatic encephalopathy.   Hospital Course:  Patient presented with acute hepatic encephalopathy, ammonia level 177 on admission. She was started on Lactulose with significant improvement. Ammonia level now 94. She is alert and oriented x4 and having regular bowel movements. She will be continued on Lactulose upon discharge.    Individual issues as below:  1. Acute hepatic encephalopathy. Clinically resolved  despite mild increase in serum ammonia. Multiple bowel movements. 2. Pyuria. Urine culture without dominant organism. 3. Cirrhosis with associated thrombocytopenia and hyponatremia secondary to chronic Hep C, treated with Harvoni. S/p TIPS 10/15.  4. CKD stage III. Stable. 5. Thrombocytopenia, chronic. Secondary to cirrhosis. 6. Iron-deficiency anemia, chronic. Managed with iron infusions through Dr. Galen [name] of oncology. No signs of active bleeding. 7. Crohn's disease, followed by Dr. [name] as outpatient. Continue management.  8. Essential HTN, stable. Continue home meds.  Consultants:  PT- HH  Procedures:  none  Antibiotics:  None  Discharge Instructions   Discharge Medication List as of 03/27/2015  1:27 PM    START taking these medications   Details  feeding supplement, ENSURE ENLIVE, (ENSURE ENLIVE) LIQD Take 237 mLs by mouth daily., Starting 03/27/2015, Until Discontinued, No Print    lactulose (CHRONULAC) 10 GM/15ML solution Take 30 mLs (20 g total) by mouth 2 (two) times daily., Starting 03/27/2015, Until Discontinued, Normal      CONTINUE these medications which have NOT CHANGED   Details  ALPRAZolam (XANAX) 1 MG tablet Take 1 tablet (1 mg total) by mouth at bedtime as needed., Starting 01/29/2015, Until Discontinued, Phone In    Cholecalciferol (VITAMIN D-1000 MAX ST) 1000 UNITS tablet Take 1,000 Units by mouth daily., Until Discontinued, Historical Med    gabapentin (NEURONTIN) 100 MG capsule Take 1 capsule (100 mg total) by mouth 3 (three) times daily., Starting 10/23/2014, Until Discontinued, Normal    hydrocortisone cream 1 % Apply 1 application topically 2 (two) times daily., Until Discontinued, Historical Med    Multiple Vitamins-Minerals (MULTIVITAMIN  WITH MINERALS) tablet Take 1 tablet by mouth daily., Until Discontinued, Historical Med    nadolol (CORGARD) 20 MG tablet Take 20 mg by mouth daily., Until Discontinued, Historical Med    pantoprazole  (PROTONIX) 40 MG tablet TAKE 1 TABLET BY MOUTH DAILY, Normal    Probiotic Product (PROBIOTIC PO) Take 1 tablet by mouth daily., Until Discontinued, Historical Med    traMADol (ULTRAM) 50 MG tablet Take 1 tablet (50 mg total) by mouth every 12 (twelve) hours as needed., Starting 01/13/2015, Until Discontinued, Print      STOP taking these medications     traZODone (DESYREL) 50 MG tablet        Allergies  Allergen Reactions  . Penicillins Rash    Has patient had a PCN reaction causing immediate rash, facial/tongue/throat swelling, SOB or lightheadedness with hypotension: Yes Has patient had a PCN reaction causing severe rash involving mucus membranes or skin necrosis: No Has patient had a PCN reaction that required hospitalization No Has patient had a PCN reaction occurring within the last 10 years: No If all of the above answers are "NO", then may proceed with Cephalosporin use.     The results of significant diagnostics from this hospitalization (including imaging, microbiology, ancillary and laboratory) are listed below for reference.    Significant Diagnostic Studies: Ct Abdomen Pelvis Wo Contrast  03/10/2015  CLINICAL DATA:  Central abdominal pain from epigastric region to back, history of Crohn's disease and colon resection with ileostomy EXAM: CT ABDOMEN AND PELVIS WITHOUT CONTRAST TECHNIQUE: Multidetector CT imaging of the abdomen and pelvis was performed following the standard protocol without IV contrast. COMPARISON:  06/10/2014 FINDINGS: Lower chest:  Clear Hepatobiliary: Status post cholecystectomy.  Tips stent noted. Pancreas: Normal Spleen: Normal Adrenals/Urinary Tract: Negative Stomach/Bowel: Stomach is decompressed but otherwise normal. There is mild diffuse wall thickening of the duodenum and proximal jejunum, with these loops mildly dilated to a diameter of about 3.7 cm. There is an anterior abdominal wall hernia to the right of midline above the umbilicus involving the  junction of proximal and mid jejunum. Beyond this, jejunum and ileum are relatively decompressed. Oral contrast extends throughout small bowel and into right lower abdominal ileostomy. Status post colectomy with decompressed sigmoid colon and rectum. Vascular/Lymphatic: No acute abnormalities Reproductive: No acute findings Other: No ascites Musculoskeletal: No acute findings IMPRESSION: Mild incomplete proximal small bowel obstruction related to anterior abdominal wall hernia. Oral contrast extends beyond this into ileostomy in there is no definite evidence of strangulation. Electronically Signed   By: Esperanza Heir M.D.   On: 03/10/2015 16:49   Dg Chest 2 View  03/10/2015  CLINICAL DATA:  Upper abdominal pain for 2 weeks, back pain EXAM: CHEST  2 VIEW COMPARISON:  07/02/2014 FINDINGS: Cardiomediastinal silhouette is stable. No acute infiltrate or pleural effusion. No pulmonary edema. Mild degenerative changes lower thoracic spine. IMPRESSION: No active cardiopulmonary disease. Electronically Signed   By: Natasha Mead M.D.   On: 03/10/2015 13:52   Dg Chest Portable 1 View  03/26/2015  CLINICAL DATA:  Increasing shortness of breath with dry nonproductive cough for 1 day. History of hypertension. Smoker. EXAM: PORTABLE CHEST 1 VIEW COMPARISON:  03/10/2015 FINDINGS: The heart size and mediastinal contours are within normal limits. Both lungs are clear. The visualized skeletal structures are unremarkable. IMPRESSION: No active disease. Electronically Signed   By: Burman Nieves M.D.   On: 03/26/2015 03:02    Labs: Basic Metabolic Panel:  Recent Labs Lab 03/26/15 0345  NA  134*  K 3.8  CL 113*  CO2 17*  GLUCOSE 113*  BUN 23*  CREATININE 1.40*  CALCIUM 8.3*   Liver Function Tests:  Recent Labs Lab 03/26/15 0345  AST 26  ALT 21  ALKPHOS 171*  BILITOT 1.1  PROT 5.4*  ALBUMIN 2.8*      Recent Labs Lab 03/26/15 0345 03/26/15 1203  AMMONIA 177* 70*   CBC:  Recent Labs Lab  03/26/15 0345  WBC 5.1  NEUTROABS 2.7  HGB 10.3*  HCT 30.4*  MCV 92.1  PLT 80*   Cardiac Enzymes:  Recent Labs Lab 03/26/15 0345  TROPONINI <0.03       Recent Labs  03/26/15 0345  BNP 74.0    CBG:  Recent Labs Lab 03/26/15 0731 03/26/15 1613  GLUCAP 129* 160*    Principal Problem:   Hepatic encephalopathy (HCC) Active Problems:   Hypertension   Crohn's disease (HCC)   Hepatic cirrhosis (HCC)   Thrombocytopenia (HCC)   CKD (chronic kidney disease), stage III   Anemia of chronic disease   Hyponatremia   Time coordinating discharge: 35 minutes  Signed:  Brendia Sacks, MD [facility] 03/27/2015, 7:12 AM    By signing my name below, I, Burnett Harry attest that this documentation has been prepared under the direction and in the presence of Brendia Sacks, MD Electronically signed: Burnett Harry, Scribe. 03/27/2015  I personally performed the services described in this documentation. All medical record entries made by the scribe were at my direction. I have reviewed the chart and agree that the record reflects my personal performance and is accurate and complete. Brendia Sacks, MD

## 2015-03-27 NOTE — Progress Notes (Signed)
Initial Nutrition Assessment  DOCUMENTATION CODES:   Obesity unspecified, Non-severe (moderate) malnutrition in context of acute illness/injury  INTERVENTION:  -Provide Ensure daily, 350 kcal, 20 grams of protein per bottle   NUTRITION DIAGNOSIS:   Malnutrition related to poor appetite as evidenced by per patient/family report, energy intake < 75% for > 7 days, percent weight loss  GOAL:   Patient will meet greater than or equal to 90% of their needs  MONITOR:   PO intake, Supplement acceptance, Weight trends, Labs  REASON FOR ASSESSMENT:   Consult  (Malnutrition)  ASSESSMENT:   Pt with PMH of Crohn's disease and apparent remission, liver cirrhosis secondary to hepatitis C status post successful treatment with Harvoni, hypertension, and iron deficiency anemia who presents to the ED with confusion and reported dyspnea.   Pt seen for malnutrition consult. Pt reports appetite has been "no good". Decreased appetite has been occuring for a while but could not elaborate on time frame.  Per chart pt consuming 75% of meals. PTA pt consumes Ensure Enlive daily. Will provide Ensure Enlive daily while she is here.  Pt sates she does not know if she has lost weight, states that she does not weigh herself regularly. She reports a usual body weight of 200-240 lb. Per chart review she has 11 lb (5%) weight loss within a month, which is significant for time frame.   Conducted nutrition focused physical exam, identified no muscle or fat wasting.   Medications reviewed. Labs reviewed; sodium 134  Diet Order:  Diet Heart Room service appropriate?: Yes; Fluid consistency:: Thin  Skin:  Reviewed, no issues  Last BM:  03/26/2015  Height:   Ht Readings from Last 1 Encounters:  03/26/15 5\' 5"  (1.651 m)    Weight:   Wt Readings from Last 1 Encounters:  03/26/15 220 lb 7.4 oz (100 kg)    Ideal Body Weight:  56.8 kg  BMI:  Body mass index is 36.69 kg/(m^2).  Estimated Nutritional  Needs:   Kcal:  1800-2000  Protein:  68-78 g (1.2g /kg IBW)  Fluid:  1.8-2 L  EDUCATION NEEDS:   No education needs identified at this time  [place], Dietetic Intern Pager: [phone]

## 2015-03-27 NOTE — Care Management Note (Signed)
Case Management Note  Patient Details  Name: Kathryn [name] MRN: [id number] Date of Birth: Apr 25, 1955  Subjective/Objective:       Spoke with patient for discharge planning. Patient is alert and oriented from home with spouse.    Drives self and independent. No DME , No difficulty obtaining medications. No CM needs idenitified. Patient stated that she is enrolled in outpatient physical therapy.           Action/Plan: Home with self care outpatient PT. Expected Discharge Date:                  Expected Discharge Plan:  Home/Self Care  In-House Referral:     Discharge planning Services     Post Acute Care Choice:    Choice offered to:     DME Arranged:    DME Agency:     HH Arranged:    HH Agency:     Status of Service:  Completed, signed off  Medicare Important Message Given:    Date Medicare IM Given:    Medicare IM give by:    Date Additional Medicare IM Given:    Additional Medicare Important Message give by:     If discussed at Long Length of Stay Meetings, dates discussed:    Additional Comments:  Adonis Huguenin, RN 03/27/2015, 1:39 PM

## 2015-03-27 NOTE — Care Management Important Message (Signed)
Important Message  Patient Details  Name: HALEI SINEATH MRN: [id number] Date of Birth: 1955/05/28   Medicare Important Message Given:  Yes    Adonis Huguenin, RN 03/27/2015, 1:41 PM

## 2015-03-27 NOTE — Progress Notes (Signed)
Pt CBG 106. 

## 2015-03-27 NOTE — Progress Notes (Signed)
Pt's IV catheter removed and intact. Pt's IV site clean dry and intact. Discharge instructions including follow up appointments and medications reviewed and discussed with patient. Pt verbalized understanding of discharge instructions. All questions were answered and no further questions at this time. Pt in stable condition and in no acute distress at time of discharge. Pt escorted by nurse tech.

## 2015-04-01 ENCOUNTER — Telehealth (INDEPENDENT_AMBULATORY_CARE_PROVIDER_SITE_OTHER): Payer: Self-pay | Admitting: Internal Medicine

## 2015-04-01 ENCOUNTER — Encounter (INDEPENDENT_AMBULATORY_CARE_PROVIDER_SITE_OTHER): Payer: Self-pay | Admitting: Internal Medicine

## 2015-04-01 NOTE — Telephone Encounter (Signed)
Kathryn [name] is scheduled on March 23rd, 2017. Thank you.

## 2015-04-01 NOTE — Telephone Encounter (Signed)
I spoke with patient.   Kathryn [name], needs OV in about 4 weeks

## 2015-04-01 NOTE — Telephone Encounter (Signed)
Ms. Darnell left a message saying she needed a hospital follow-up appointment. When I called her back she said she was in the hospital for multiple reasons but one of which being the fact that she has cirrhosis of the liver. She said the doctors were keeping a close eye on it. She's thinking she needs a hospital follow-up appointment with [name]. She'd like a phone call regarding this.  Pt's ph# [phone]  Thank you.

## 2015-04-02 ENCOUNTER — Ambulatory Visit (INDEPENDENT_AMBULATORY_CARE_PROVIDER_SITE_OTHER): Payer: Medicare HMO | Admitting: Family

## 2015-04-02 ENCOUNTER — Encounter: Payer: Self-pay | Admitting: Family

## 2015-04-02 VITALS — BP 99/58 | HR 69 | Temp 97.7°F | Ht 65.0 in | Wt 209.0 lb

## 2015-04-02 DIAGNOSIS — I959 Hypotension, unspecified: Secondary | ICD-10-CM | POA: Diagnosis not present

## 2015-04-02 DIAGNOSIS — K50918 Crohn's disease, unspecified, with other complication: Secondary | ICD-10-CM

## 2015-04-02 DIAGNOSIS — K7290 Hepatic failure, unspecified without coma: Secondary | ICD-10-CM

## 2015-04-02 DIAGNOSIS — N183 Chronic kidney disease, stage 3 unspecified: Secondary | ICD-10-CM

## 2015-04-02 DIAGNOSIS — K7682 Hepatic encephalopathy: Secondary | ICD-10-CM

## 2015-04-02 DIAGNOSIS — R894 Abnormal immunological findings in specimens from other organs, systems and tissues: Secondary | ICD-10-CM | POA: Diagnosis not present

## 2015-04-02 DIAGNOSIS — R768 Other specified abnormal immunological findings in serum: Secondary | ICD-10-CM

## 2015-04-02 DIAGNOSIS — Z09 Encounter for follow-up examination after completed treatment for conditions other than malignant neoplasm: Secondary | ICD-10-CM | POA: Diagnosis not present

## 2015-04-02 DIAGNOSIS — R531 Weakness: Secondary | ICD-10-CM

## 2015-04-02 DIAGNOSIS — E871 Hypo-osmolality and hyponatremia: Secondary | ICD-10-CM

## 2015-04-02 NOTE — Addendum Note (Signed)
Addended by: Orma Render F on: 04/02/2015 12:46 PM   Modules accepted: Orders

## 2015-04-02 NOTE — Addendum Note (Signed)
Addended by: Orma Render F on: 04/02/2015 12:45 PM   Modules accepted: Orders

## 2015-04-02 NOTE — Progress Notes (Signed)
   Subjective:    Patient ID: Kathryn [name], female    DOB: December 30, 1955, 60 y.o.   MRN: [id number]  HPI Pt presents to the office today for hospital follow up. Pt was admitted to the hospital for hepatic encephalopathy, dyspnea, HTN, CKD 3, and cirrhosis a secondary to Hepatitis C. Pt states she is going better, but seems weak. Pt has appt with GI on 03/24. Pt's ammonia was elevated and was given rx of lactulose BID. Pt states she has never seen a nephrologists. Pt is complaining of SOB, dyspnea, and fatigue. Pt denies any headache, palpitations, or edema at this time.   [facility] notes were reviewed.   Review of Systems  Constitutional: Negative.   HENT: Negative.   Eyes: Negative.   Respiratory: Negative.  Negative for shortness of breath.   Cardiovascular: Negative.  Negative for palpitations.  Gastrointestinal: Negative.   Endocrine: Negative.   Genitourinary: Negative.   Musculoskeletal: Negative.   Neurological: Negative.  Negative for headaches.  Hematological: Negative.   Psychiatric/Behavioral: Negative.   All other systems reviewed and are negative.      Objective:   Physical Exam  Constitutional: She is oriented to person, place, and time. She appears well-developed and well-nourished. No distress.  HENT:  Head: Normocephalic and atraumatic.  Eyes: Pupils are equal, round, and reactive to light.  Neck: Normal range of motion. Neck supple. No thyromegaly present.  Cardiovascular: Normal rate, regular rhythm, normal heart sounds and intact distal pulses.   No murmur heard. Pulmonary/Chest: Effort normal and breath sounds normal. No respiratory distress. She has no wheezes.  Abdominal: Soft. Bowel sounds are normal. She exhibits no distension. There is no tenderness.  Musculoskeletal: Normal range of motion. She exhibits edema (trace in BLE). She exhibits no tenderness.  Neurological: She is alert and oriented to person, place, and time. She has normal reflexes. No  cranial nerve deficit.  Skin: Skin is warm and dry.  Psychiatric: She has a normal mood and affect. Her behavior is normal. Judgment and thought content normal.  Vitals reviewed.     BP 99/58 mmHg  Pulse 69  Temp(Src) 97.7 F (36.5 C) (Oral)  Ht '5\' 5"'$  (1.651 m)  Wt 209 lb (94.802 kg)  BMI 34.78 kg/m2     Assessment & Plan:  1. Crohn's disease with other complication ([place]) - VQX4+HWTU  2. Hepatic encephalopathy (HCC) - BMP8+EGFR - AMMONIA  3. CKD (chronic kidney disease), stage III - Ambulatory referral to Nephrology - BMP8+EGFR  4. Hepatitis C antibody test positive - BMP8+EGFR  5. Hyponatremia - BMP8+EGFR  6. Weakness - BMP8+EGFR  7. Hypotension, unspecified hypotension type  -Kidney diet discussed -Referral to nephrologists pending  Continue all meds Labs pending Health Maintenance reviewed Diet and exercise encouraged RTO 3 months  [name], FNP

## 2015-04-02 NOTE — Patient Instructions (Signed)
Food Basics for Chronic Kidney Disease When your kidneys are not working well, they cannot remove waste and excess substances from your blood as effectively as they did before. This can lead to a buildup and imbalance of these substances, which can affect how your body functions. This buildup can also make your kidneys work harder, causing even more damage. You may need to eat less of certain foods that can lead to the buildup of these substances in your body. By making the changes to your diet that are recommended by your dietitian or health care provider, you could possibly help prevent further kidney damage and delay or prevent the need for dialysis. The following information can help give you a basic understanding of these substances and how they affect your bodily functions. The information also gives examples of foods that contain the highest amounts of these substances. WHAT DO I NEED TO KNOW ABOUT SUBSTANCES IN MY FOOD THAT I MAY NEED TO ADJUST? Food adjustments will be different for each person with chronic kidney disease. It is important that you see a dietitian who can help you determine the specific adjustments that you will need to make for each of the following substances: Potassium Potassium affects how steadily your heart beats. If too much potassium builds up in your blood, it can cause an irregular heartbeat or even a heart attack. Examples of foods rich in potassium include:  Milk.  Fruits.  Vegetables. Phosphorus Phosphorus is a mineral found in your bones. A balance between calcium and phosphorous is needed to build and maintain healthy bones. Too much phosphorus pulls calcium from your bones. This can make your bones weak and more likely to break. Too much phosphorus can also make your skin itch. Examples of foods rich in phosphorus include:  Milk and cheese.  Dried beans.  Peas.  Colas.  Nuts and peanut butter. Animal Protein Animal protein helps you make and keep  muscle. It also helps in the repair of your body's cells and tissues. One of the natural breakdown products of protein is a waste product called urea. When your kidneys are not working properly, they cannot remove wastes such as urea like they did before you developed chronic kidney disease. You will likely need to limit the amount of protein you eat to help prevent a buildup of urea in your blood. Examples of animal protein include:  Meat (all types).  Fish and seafood.  Poultry.  Eggs. Sodium Sodium, which is found in salt, helps maintain a healthy balance of fluids in your body. Too much sodium can increase your blood pressure level and have a negative affect on the function of your heart and lungs. Too much sodium also can cause your body to retain too much fluid, making your kidneys work harder. Examples of foods with high levels of sodium include:  Salt seasonings.  Soy sauce.  Cured and processed meats.  Salted crackers and snack foods.  Fast food.  Canned soups and most canned foods. Glucose Glucose provides energy for your body. If you have diabetes mellitus that is not properly controlled, you have too much glucose in your blood. Too much glucose in your blood can worsen the function of your kidneys by damaging small blood vessels. This prevents enough blood flow to your kidneys to give them what they need to work. If you have diabetes mellitus and chronic kidney disease, it is important to maintain your blood glucose at a level recommended by your health care provider. SHOULD I   TAKE A VITAMIN AND MINERAL SUPPLEMENT? Because you may need to avoid eating certain foods, you may not get all of the vitamins and minerals that would normally come from those foods. Your health care provider or dietitian may recommend that you take a supplement to ensure that you get all of the vitamins and minerals that your body needs.    This information is not intended to replace advice given to you  by your health care provider. Make sure you discuss any questions you have with your health care provider.   Document Released: 04/16/2002 Document Revised: 02/14/2014 Document Reviewed: 12/21/2012 Elsevier Interactive Patient Education 2016 [employer] Hepatic Encephalopathy Hepatic encephalopathy is a loss of brain function from advanced liver disease. The effects of the condition depend on the type of liver damage and how severe it is. In some cases, hepatic encephalopathy can be reversed. CAUSES The exact cause of hepatic encephalopathy is not known. RISK FACTORS You have a higher risk of getting this condition if your liver is damaged. When the liver is damaged harmful substances called toxins can build up in the body. Certain toxins, such as ammonia, can harm your brain. Conditions that can cause liver damage include:  An infection.  Dehydration.  Intestinal bleeding.  Drinking too much alcohol.  Taking certain medicines, including tranquilizers, water pills (diuretics), antidepressants, or sleeping pills. SIGNS AND SYMPTOMS Signs and symptoms may develop suddenly. Or, they may develop slowly and get worse gradually. Symptoms can range from mild to severe. Mild Hepatic Encephalopathy  Mild confusion.  Personality and mood changes.  Anxiety and agitation.  Drowsiness.  Loss of mental abilities.  Musty or sweet-smelling breath. Worsening or Severe Hepatic Encephalopathy  Slowed movement.  Slurred speech.  Extreme personality changes.  Disorientation.  Abnormal shaking or flapping of the hands.  Coma. DIAGNOSIS To make a diagnosis, your health care provider will do a physical exam. To rule out other causes of your signs and symptoms, he or she may order tests. You may have:  Blood tests. These may be done to check your ammonia level, measure how long it takes your blood to clot, and check for infection.  Liver function tests. These may be done to check how  well your liver is working.  MRI and CT scans. These may be done to check for a brain disorder.  Electroencephalogram (EEG). This may be done to measure the electrical activity in your brain. TREATMENT The first step in treatment is identifying and treating possible triggers. The next step is involves taking medicine to lower the level of toxins in the body and to prevent ammonia from building up. You may need to take:  Antibiotics to reduce the ammonia-producing bacteria in your gut.  Lactulose to help flush ammonia from the gut. HOME CARE INSTRUCTIONS Eating and Drinking  Follow a low-protein diet that includes plenty of fruits, vegetables, and whole grains, as directed by your health care provider. Ammonia is produced when you digest high-protein foods.  Work with a Data processing manager or with your health care provider to make sure you are getting the right balance of protein and minerals.  Drink enough fluids to keep your urine clear or pale yellow. Drinking plenty of water helps prevent constipation.  Do not drink alcohol or use illegal drugs. Medicines  Only take medicine as directed by your health care provider.  If you were prescribed an antibiotic medicine, finish it all even if you start to feel better.  Do not start any new medicines,  including over-the-counter medicines, without first checking with your health care provider. SEEK MEDICAL CARE IF:  You have new symptoms.  Your symptoms change.  Your symptoms get worse.  You have a fever.  You are constipated.  You have persistent nausea, vomiting, or diarrhea. SEEK IMMEDIATE MEDICAL CARE IF:  You become very confused or drowsy.  You vomit blood or material that looks like coffee grounds.  Your stool is bloody or black or looks like tar.   This information is not intended to replace advice given to you by your health care provider. Make sure you discuss any questions you have with your health care provider.    Document Released: 04/05/2006 Document Revised: 02/14/2014 Document Reviewed: 09/11/2013 Elsevier Interactive Patient Education [employer]

## 2015-04-03 ENCOUNTER — Other Ambulatory Visit: Payer: Self-pay | Admitting: Family

## 2015-04-03 LAB — BMP8+EGFR
BUN / CREAT RATIO: 17 (ref 9–23)
BUN: 34 mg/dL — AB (ref 6–24)
CALCIUM: 8.7 mg/dL (ref 8.7–10.2)
CO2: 15 mmol/L — ABNORMAL LOW (ref 18–29)
CREATININE: 1.97 mg/dL — AB (ref 0.57–1.00)
Chloride: 100 mmol/L (ref 96–106)
GFR calc Af Amer: 32 mL/min/{1.73_m2} — ABNORMAL LOW (ref >59)
GFR, EST NON AFRICAN AMERICAN: 27 mL/min/{1.73_m2} — AB (ref >59)
Glucose: 141 mg/dL — ABNORMAL HIGH (ref 65–99)
Potassium: 3.3 mmol/L — ABNORMAL LOW (ref 3.5–5.2)
SODIUM: 131 mmol/L — AB (ref 134–144)

## 2015-04-03 LAB — AMMONIA: AMMONIA: 143 ug/dL — AB (ref 19–87)

## 2015-04-03 MED ORDER — POTASSIUM CHLORIDE ER 10 MEQ PO TBCR: 10.0000 meq | EXTENDED_RELEASE_TABLET | Freq: Every day | ORAL | Status: DC

## 2015-04-06 ENCOUNTER — Other Ambulatory Visit: Payer: Self-pay | Admitting: Family

## 2015-04-06 ENCOUNTER — Other Ambulatory Visit: Payer: Medicare HMO

## 2015-04-06 DIAGNOSIS — K7682 Hepatic encephalopathy: Secondary | ICD-10-CM

## 2015-04-06 DIAGNOSIS — K7290 Hepatic failure, unspecified without coma: Secondary | ICD-10-CM

## 2015-04-07 ENCOUNTER — Telehealth (INDEPENDENT_AMBULATORY_CARE_PROVIDER_SITE_OTHER): Payer: Self-pay | Admitting: Internal Medicine

## 2015-04-07 DIAGNOSIS — K7460 Unspecified cirrhosis of liver: Secondary | ICD-10-CM

## 2015-04-07 LAB — AMMONIA: Ammonia: 109 ug/dL — ABNORMAL HIGH (ref 19–87)

## 2015-04-07 NOTE — Telephone Encounter (Signed)
Ms. Stavropoulos left a message saying her PCP informed her that her ammonia levels were high. She wanted to make [name] aware that she had labs drawn for this.  Thank you.

## 2015-04-07 NOTE — Telephone Encounter (Signed)
Advised patient to increase Lactulose to 45cc BID. Will repeat ammonia in 2 weeks.

## 2015-04-07 NOTE — Telephone Encounter (Signed)
I advised patient to take 45cc Lactulose twice a day.   Tammy, ammonia level in 2 weeks.

## 2015-04-08 ENCOUNTER — Encounter (INDEPENDENT_AMBULATORY_CARE_PROVIDER_SITE_OTHER): Payer: Self-pay | Admitting: *Deleted

## 2015-04-08 NOTE — Telephone Encounter (Signed)
.  Per Delrae Rend to have ammonia level in 2 weeks.

## 2015-04-08 NOTE — Telephone Encounter (Signed)
Lab noted for 2 weeks. A letter has been sent to the patient as a reminder.

## 2015-04-09 NOTE — Telephone Encounter (Signed)
Ms. Haen called saying Kathryn [name] asked her who prescribed the Lactulose. She initially said her PCP prescribed it but this morning she said a Provider at the hospital actually prescribed it for her. She wanted to make Kathryn [name] aware of this.  Thank you.

## 2015-04-09 NOTE — Telephone Encounter (Signed)
noted 

## 2015-04-15 ENCOUNTER — Encounter: Payer: Self-pay | Admitting: Pediatrics

## 2015-04-15 ENCOUNTER — Ambulatory Visit (INDEPENDENT_AMBULATORY_CARE_PROVIDER_SITE_OTHER): Payer: Medicare HMO | Admitting: Pediatrics

## 2015-04-15 VITALS — BP 106/66 | HR 57 | Temp 97.1°F | Ht 65.0 in | Wt 200.8 lb

## 2015-04-15 DIAGNOSIS — M545 Low back pain, unspecified: Secondary | ICD-10-CM

## 2015-04-15 DIAGNOSIS — K50918 Crohn's disease, unspecified, with other complication: Secondary | ICD-10-CM

## 2015-04-15 DIAGNOSIS — K7469 Other cirrhosis of liver: Secondary | ICD-10-CM | POA: Diagnosis not present

## 2015-04-15 DIAGNOSIS — N390 Urinary tract infection, site not specified: Secondary | ICD-10-CM | POA: Diagnosis not present

## 2015-04-15 DIAGNOSIS — R5382 Chronic fatigue, unspecified: Secondary | ICD-10-CM

## 2015-04-15 DIAGNOSIS — R8281 Pyuria: Secondary | ICD-10-CM

## 2015-04-15 LAB — URINALYSIS
Bilirubin, UA: NEGATIVE
Glucose, UA: NEGATIVE
KETONES UA: NEGATIVE
NITRITE UA: NEGATIVE
PH UA: 6.5 (ref 5.0–7.5)
Specific Gravity, UA: 1.015 (ref 1.005–1.030)
UUROB: 0.2 mg/dL (ref 0.2–1.0)

## 2015-04-15 LAB — MICROSCOPIC EXAMINATION: RBC, UA: >30 /hpf — ABNORMAL HIGH (ref 0–?)

## 2015-04-15 LAB — URINALYSIS, COMPLETE

## 2015-04-15 MED ORDER — CIPROFLOXACIN HCL 500 MG PO TABS: 500.0000 mg | ORAL_TABLET | Freq: Two times a day (BID) | ORAL | Status: DC

## 2015-04-15 NOTE — Addendum Note (Signed)
Addended by: Johna Sheriff on: 04/15/2015 07:30 PM   Modules accepted: Orders, Medications

## 2015-04-15 NOTE — Progress Notes (Addendum)
Subjective:    Patient ID: Kathryn [name], female    DOB: 03-07-1955, 60 y.o.   MRN: [id number]  CC: Fatigue and Back Pain   HPI: Kathryn [name] is a 60 y.o. female presenting for Fatigue and Back Pain  Complicated PMH including ileostomy due to crohn's disease, s/p colonectomy, S/p harvoni for Hep C,with cirrhosis. H/o variceal bleed and hepatic encephalopathy. On lactulose daily. Having a lot of UTIs recently Having weakness, nausea, pain in lower back. Sometimes feels like this with UTIs. Ongoing past couple weeks. Worse the last couple of days.  On lactulose for h/o hepatic encephalopathy No swelling in legs Weight has gone about 20 lbs over past 3 weeks Drinking ensure regularly Appetite has been poor Tries to eat something for breakfast lunch and dinner Not vomiting No SOB, does not feel like she is holding onto any fluid.  Minimal bloody discharge from anus. Usually does have scant amount of discharge, sometimes bloody.  Depression screen [facility] 2/9 04/15/2015 02/23/2015 11/17/2014 02/21/2014 01/13/2014  Decreased Interest 0 0 1 0 0  Down, Depressed, Hopeless 0 0 2 0 0  PHQ - 2 Score 0 0 3 0 0  Altered sleeping - - 1 - -  Tired, decreased energy - - 3 - -  Change in appetite - - 2 - -  Feeling bad or failure about yourself  - - 3 - -  Trouble concentrating - - 1 - -  Moving slowly or fidgety/restless - - 0 - -  Suicidal thoughts - - 0 - -  PHQ-9 Score - - 13 - -  Difficult doing work/chores - - Somewhat difficult - -     Relevant past medical, surgical, family and social history reviewed and updated as indicated. Interim medical history since our last visit reviewed. Allergies and medications reviewed and updated.    ROS: Per HPI unless specifically indicated above  History  Smoking status  . Never Smoker   Smokeless tobacco  . Never Used    Past Medical History Patient Active Problem List   Diagnosis Date Noted  . Hepatic encephalopathy ([place])  03/26/2015  . Hypotension 03/12/2015  . AKI (acute kidney injury) ([place])   . Malnutrition of moderate degree 03/11/2015  . AP (abdominal pain)   . Abdominal pain 03/10/2015  . Partial small bowel obstruction ([place]) 03/10/2015  . Venous stasis dermatitis 12/17/2014  . Hypokalemia   . Acute on chronic renal failure ([place]) 06/09/2014  . Hyponatremia   . Iron deficiency 01/09/2014  . CKD (chronic kidney disease), stage III 11/25/2013  . Anemia of chronic disease 11/25/2013  . Exertional dyspnea 09/30/2013  . Weakness 09/30/2013  . Thrombocytopenia ([place]) 09/10/2013  . Coagulopathy ([place]) 09/10/2013  . GI bleed 09/09/2013  . GERD (gastroesophageal reflux disease) 07/04/2013  . Peripheral neuropathy ([place]) 07/04/2013  . Insomnia 07/04/2013  . Hepatitis C reactive 01/14/2013  . Hepatitis C antibody test positive 12/20/2012  . Hepatic cirrhosis ([place]) 11/14/2012  . Acute blood loss anemia 09/02/2010  . Hypertension   . Crohn's disease ([place])         Objective:    BP 106/66 mmHg  Pulse 57  Temp(Src) 97.1 F (36.2 C) (Oral)  Ht 5' 5"  (1.651 m)  Wt 200 lb 12.8 oz (91.082 kg)  BMI 33.41 kg/m2  Wt Readings from Last 3 Encounters:  04/15/15 200 lb 12.8 oz (91.082 kg)  04/02/15 209 lb (94.802 kg)  03/26/15 220 lb 7.4 oz (100 kg)  Gen: NAD, alert, cooperative with exam, NCAT EYES: EOMI, no scleral injection or icterus ENT:  TMs pearly gray b/l, OP without erythema LYMPH: no cervical LAD CV: NRRR, normal S1/S2, no murmur, distal pulses 2+ b/l Resp: CTABL, no wheezes, normal WOB Abd: +BS, soft, NTND. no guarding or organomegaly. Ileostomy in place, liquid brown stool in bag. Mild CVA tenderness b/l.  Ext: No edema, warm Neuro: Alert and oriented MSK: normal muscle bulk Rectal: no tenderness, no abscesses on external exam     Assessment & Plan:    [place] w/ h/o cirrhosis, crohn's disease was seen today for fatigue and back pain. Will get below labs, and urine to eval for UTI.  Mental status clear.  Diagnoses and all orders for this visit:  Bilateral low back pain without sciatica -     CMP14+EGFR -     CBC with Differential -     Urinalysis -     Urine culture  Chronic fatigue -     CMP14+EGFR -     CBC with Differential -     Urinalysis -     Urine culture  Crohn's disease with other complication (HCC) -     CMP14+EGFR -     CBC with Differential -     Urinalysis -     Urine culture  Other cirrhosis of liver (HCC) -     CMP14+EGFR -     CBC with Differential -     Urinalysis -     Urine culture  [place]ia: start cipro, f/u culture  Follow up plan: Return in about 2 weeks (around 04/29/2015), or if symptoms worsen or fail to improve.  Assunta Found, MD [facility] 04/15/2015, 11:28 AM

## 2015-04-16 ENCOUNTER — Telehealth: Payer: Self-pay | Admitting: Pediatrics

## 2015-04-16 ENCOUNTER — Inpatient Hospital Stay (HOSPITAL_COMMUNITY): Payer: Medicare HMO

## 2015-04-16 ENCOUNTER — Encounter (HOSPITAL_COMMUNITY): Payer: Self-pay | Admitting: Emergency Medicine

## 2015-04-16 ENCOUNTER — Inpatient Hospital Stay (HOSPITAL_COMMUNITY)
Admission: EM | Admit: 2015-04-16 | Discharge: 2015-04-22 | DRG: 641 | Disposition: A | Discharge status: Home / Self Care | Payer: Medicare HMO | Attending: Internal Medicine | Admitting: Internal Medicine

## 2015-04-16 DIAGNOSIS — K7469 Other cirrhosis of liver: Secondary | ICD-10-CM | POA: Diagnosis not present

## 2015-04-16 DIAGNOSIS — K7290 Hepatic failure, unspecified without coma: Secondary | ICD-10-CM | POA: Diagnosis present

## 2015-04-16 DIAGNOSIS — Z9049 Acquired absence of other specified parts of digestive tract: Secondary | ICD-10-CM | POA: Diagnosis not present

## 2015-04-16 DIAGNOSIS — Z809 Family history of malignant neoplasm, unspecified: Secondary | ICD-10-CM | POA: Diagnosis not present

## 2015-04-16 DIAGNOSIS — E871 Hypo-osmolality and hyponatremia: Secondary | ICD-10-CM | POA: Diagnosis present

## 2015-04-16 DIAGNOSIS — Z932 Ileostomy status: Secondary | ICD-10-CM

## 2015-04-16 DIAGNOSIS — G629 Polyneuropathy, unspecified: Secondary | ICD-10-CM | POA: Diagnosis present

## 2015-04-16 DIAGNOSIS — R7989 Other specified abnormal findings of blood chemistry: Secondary | ICD-10-CM

## 2015-04-16 DIAGNOSIS — B1920 Unspecified viral hepatitis C without hepatic coma: Secondary | ICD-10-CM | POA: Diagnosis present

## 2015-04-16 DIAGNOSIS — D696 Thrombocytopenia, unspecified: Secondary | ICD-10-CM

## 2015-04-16 DIAGNOSIS — D649 Anemia, unspecified: Secondary | ICD-10-CM | POA: Diagnosis present

## 2015-04-16 DIAGNOSIS — K219 Gastro-esophageal reflux disease without esophagitis: Secondary | ICD-10-CM | POA: Diagnosis present

## 2015-04-16 DIAGNOSIS — N390 Urinary tract infection, site not specified: Secondary | ICD-10-CM | POA: Diagnosis present

## 2015-04-16 DIAGNOSIS — K7460 Unspecified cirrhosis of liver: Secondary | ICD-10-CM | POA: Diagnosis present

## 2015-04-16 DIAGNOSIS — T502X5A Adverse effect of carbonic-anhydrase inhibitors, benzothiadiazides and other diuretics, initial encounter: Secondary | ICD-10-CM | POA: Diagnosis present

## 2015-04-16 DIAGNOSIS — N19 Unspecified kidney failure: Secondary | ICD-10-CM

## 2015-04-16 DIAGNOSIS — R778 Other specified abnormalities of plasma proteins: Secondary | ICD-10-CM

## 2015-04-16 DIAGNOSIS — R531 Weakness: Secondary | ICD-10-CM

## 2015-04-16 DIAGNOSIS — N183 Chronic kidney disease, stage 3 unspecified: Secondary | ICD-10-CM | POA: Diagnosis present

## 2015-04-16 DIAGNOSIS — Z823 Family history of stroke: Secondary | ICD-10-CM

## 2015-04-16 DIAGNOSIS — E8770 Fluid overload, unspecified: Secondary | ICD-10-CM | POA: Diagnosis present

## 2015-04-16 DIAGNOSIS — B9562 Methicillin resistant Staphylococcus aureus infection as the cause of diseases classified elsewhere: Secondary | ICD-10-CM | POA: Diagnosis present

## 2015-04-16 DIAGNOSIS — I129 Hypertensive chronic kidney disease with stage 1 through stage 4 chronic kidney disease, or unspecified chronic kidney disease: Secondary | ICD-10-CM | POA: Diagnosis present

## 2015-04-16 DIAGNOSIS — E876 Hypokalemia: Secondary | ICD-10-CM

## 2015-04-16 DIAGNOSIS — G6289 Other specified polyneuropathies: Secondary | ICD-10-CM

## 2015-04-16 HISTORY — DX: Thrombocytopenia, unspecified: D69.6

## 2015-04-16 LAB — COMPREHENSIVE METABOLIC PANEL
ALK PHOS: 164 U/L — AB (ref 38–126)
ALT: 24 U/L (ref 14–54)
ANION GAP: 4 — AB (ref 5–15)
AST: 38 U/L (ref 15–41)
Albumin: 2.9 g/dL — ABNORMAL LOW (ref 3.5–5.0)
BILIRUBIN TOTAL: 1.9 mg/dL — AB (ref 0.3–1.2)
BUN: 43 mg/dL — ABNORMAL HIGH (ref 6–20)
CALCIUM: 8.7 mg/dL — AB (ref 8.9–10.3)
CO2: 18 mmol/L — ABNORMAL LOW (ref 22–32)
Chloride: 93 mmol/L — ABNORMAL LOW (ref 101–111)
Creatinine, Ser: 1.49 mg/dL — ABNORMAL HIGH (ref 0.44–1.00)
GFR, EST AFRICAN AMERICAN: 44 mL/min — AB (ref >60)
GFR, EST NON AFRICAN AMERICAN: 38 mL/min — AB (ref >60)
Glucose, Bld: 125 mg/dL — ABNORMAL HIGH (ref 65–99)
Potassium: 2.8 mmol/L — ABNORMAL LOW (ref 3.5–5.1)
Sodium: 115 mmol/L — CL (ref 135–145)
TOTAL PROTEIN: 5.8 g/dL — AB (ref 6.5–8.1)

## 2015-04-16 LAB — CBC WITH DIFFERENTIAL/PLATELET
BASOS ABS: 0 10*3/uL (ref 0.0–0.1)
BASOS PCT: 0 %
BASOS: 0 %
Basophils Absolute: 0 10*3/uL (ref 0.0–0.2)
EOS (ABSOLUTE): 0.3 10*3/uL (ref 0.0–0.4)
EOS ABS: 0.1 10*3/uL (ref 0.0–0.7)
EOS: 2 %
Eosinophils Relative: 1 %
HCT: 32.9 % — ABNORMAL LOW (ref 36.0–46.0)
HEMOGLOBIN: 12.9 g/dL (ref 11.1–15.9)
HEMOGLOBIN: 12.2 g/dL (ref 12.0–15.0)
Hematocrit: 37.2 % (ref 34.0–46.6)
IMMATURE GRANS (ABS): 0 10*3/uL (ref 0.0–0.1)
IMMATURE GRANULOCYTES: 0 %
LYMPHS: 25 %
Lymphocytes Absolute: 2.9 10*3/uL (ref 0.7–3.1)
Lymphocytes Relative: 18 %
Lymphs Abs: 1.8 10*3/uL (ref 0.7–4.0)
MCH: 29.9 pg (ref 26.6–33.0)
MCH: 30.3 pg (ref 26.0–34.0)
MCHC: 34.7 g/dL (ref 31.5–35.7)
MCHC: 37.1 g/dL — ABNORMAL HIGH (ref 30.0–36.0)
MCV: 86 fL (ref 79–97)
MCV: 81.8 fL (ref 78.0–100.0)
MONOCYTES: 7 %
Monocytes Absolute: 0.8 10*3/uL (ref 0.1–0.9)
Monocytes Absolute: 0.8 10*3/uL (ref 0.1–1.0)
Monocytes Relative: 8 %
NEUTROS ABS: 7.6 10*3/uL — AB (ref 1.4–7.0)
NEUTROS PCT: 66 %
NEUTROS PCT: 73 %
Neutro Abs: 7.3 10*3/uL (ref 1.7–7.7)
PLATELETS: 137 10*3/uL — AB (ref 150–379)
Platelets: 104 10*3/uL — ABNORMAL LOW (ref 150–400)
RBC: 4.31 x10E6/uL (ref 3.77–5.28)
RBC: 4.02 MIL/uL (ref 3.87–5.11)
RDW: 15.4 % (ref 12.3–15.4)
RDW: 13.9 % (ref 11.5–15.5)
WBC: 11.6 10*3/uL — ABNORMAL HIGH (ref 3.4–10.8)
WBC: 10 10*3/uL (ref 4.0–10.5)

## 2015-04-16 LAB — CMP14+EGFR
ALT: 25 IU/L (ref 0–32)
AST: 39 IU/L (ref 0–40)
Albumin/Globulin Ratio: 1.3 (ref 1.1–2.5)
Albumin: 3.3 g/dL — ABNORMAL LOW (ref 3.5–5.5)
Alkaline Phosphatase: 189 IU/L — ABNORMAL HIGH (ref 39–117)
BUN/Creatinine Ratio: 25 — ABNORMAL HIGH (ref 9–23)
BUN: 37 mg/dL — AB (ref 6–24)
Bilirubin Total: 2.2 mg/dL — ABNORMAL HIGH (ref 0.0–1.2)
CALCIUM: 9 mg/dL (ref 8.7–10.2)
CO2: 15 mmol/L — ABNORMAL LOW (ref 18–29)
CREATININE: 1.49 mg/dL — AB (ref 0.57–1.00)
Chloride: 82 mmol/L — ABNORMAL LOW (ref 96–106)
GFR, EST AFRICAN AMERICAN: 44 mL/min/{1.73_m2} — AB (ref >59)
GFR, EST NON AFRICAN AMERICAN: 38 mL/min/{1.73_m2} — AB (ref >59)
Globulin, Total: 2.5 g/dL (ref 1.5–4.5)
Glucose: 103 mg/dL — ABNORMAL HIGH (ref 65–99)
Potassium: 3.4 mmol/L — ABNORMAL LOW (ref 3.5–5.2)
Sodium: 113 mmol/L — CL (ref 134–144)
TOTAL PROTEIN: 5.8 g/dL — AB (ref 6.0–8.5)

## 2015-04-16 LAB — TROPONIN I
TROPONIN I: 0.1 ng/mL — AB (ref <0.031)
TROPONIN I: 0.07 ng/mL — AB (ref <0.031)

## 2015-04-16 LAB — PROTIME-INR
INR: 1.43 (ref 0.00–1.49)
PROTHROMBIN TIME: 17.5 s — AB (ref 11.6–15.2)

## 2015-04-16 LAB — AMMONIA: AMMONIA: 24 umol/L (ref 9–35)

## 2015-04-16 LAB — MAGNESIUM: Magnesium: 1.8 mg/dL (ref 1.7–2.4)

## 2015-04-16 LAB — TSH: TSH: 0.728 u[IU]/mL (ref 0.350–4.500)

## 2015-04-16 MED ORDER — ONDANSETRON HCL 4 MG PO TABS: 4.0000 mg | ORAL_TABLET | Freq: Four times a day (QID) | ORAL | Status: DC | PRN

## 2015-04-16 MED ORDER — POTASSIUM CHLORIDE CRYS ER 20 MEQ PO TBCR
40.0000 meq | EXTENDED_RELEASE_TABLET | Freq: Once | ORAL | Status: AC
  Administered 2015-04-16: 40 meq via ORAL
  Filled 2015-04-16: qty 2

## 2015-04-16 MED ORDER — LACTULOSE 10 GM/15ML PO SOLN
20.0000 g | Freq: Two times a day (BID) | ORAL | Status: DC
Start: 2015-04-16 — End: 2015-04-22
  Administered 2015-04-16 – 2015-04-22 (×10): 20 g via ORAL
  Filled 2015-04-16 (×12): qty 30

## 2015-04-16 MED ORDER — SODIUM CHLORIDE 0.9 % IV SOLN
INTRAVENOUS | Status: AC
  Administered 2015-04-16: 16:00:00 via INTRAVENOUS

## 2015-04-16 MED ORDER — PANTOPRAZOLE SODIUM 40 MG PO TBEC
40.0000 mg | DELAYED_RELEASE_TABLET | Freq: Every day | ORAL | Status: DC
  Administered 2015-04-17 – 2015-04-22 (×6): 40 mg via ORAL
  Filled 2015-04-16 (×6): qty 1

## 2015-04-16 MED ORDER — ONDANSETRON HCL 4 MG/2ML IJ SOLN
4.0000 mg | Freq: Four times a day (QID) | INTRAMUSCULAR | Status: DC | PRN
Start: 2015-04-16 — End: 2015-04-22

## 2015-04-16 MED ORDER — SODIUM CHLORIDE 0.9 % IV SOLN
Freq: Once | INTRAVENOUS | Status: AC
  Administered 2015-04-16: 11:00:00 via INTRAVENOUS

## 2015-04-16 MED ORDER — SODIUM CHLORIDE 0.9% FLUSH
3.0000 mL | Freq: Two times a day (BID) | INTRAVENOUS | Status: DC
  Administered 2015-04-17 – 2015-04-22 (×4): 3 mL via INTRAVENOUS

## 2015-04-16 MED ORDER — ACETAMINOPHEN 650 MG RE SUPP: 650.0000 mg | Freq: Four times a day (QID) | RECTAL | Status: DC | PRN

## 2015-04-16 MED ORDER — ENSURE ENLIVE PO LIQD
237.0000 mL | Freq: Every day | ORAL | Status: DC
  Administered 2015-04-19 – 2015-04-22 (×4): 237 mL via ORAL

## 2015-04-16 MED ORDER — NADOLOL 20 MG PO TABS
20.0000 mg | ORAL_TABLET | Freq: Every day | ORAL | Status: DC
  Administered 2015-04-17 – 2015-04-22 (×6): 20 mg via ORAL
  Filled 2015-04-16 (×8): qty 1

## 2015-04-16 MED ORDER — POTASSIUM CHLORIDE IN NACL 40-0.9 MEQ/L-% IV SOLN
INTRAVENOUS | Status: DC
  Administered 2015-04-16 – 2015-04-17 (×2): 100 mL/h via INTRAVENOUS

## 2015-04-16 MED ORDER — ALPRAZOLAM 1 MG PO TABS
1.0000 mg | ORAL_TABLET | Freq: Every evening | ORAL | Status: DC | PRN
  Administered 2015-04-20 – 2015-04-21 (×2): 1 mg via ORAL
  Filled 2015-04-16 (×2): qty 1

## 2015-04-16 MED ORDER — SODIUM CHLORIDE 0.9 % IV BOLUS (SEPSIS)
500.0000 mL | Freq: Once | INTRAVENOUS | Status: AC
  Administered 2015-04-16: 500 mL via INTRAVENOUS

## 2015-04-16 MED ORDER — ACETAMINOPHEN 325 MG PO TABS: 650.0000 mg | ORAL_TABLET | Freq: Four times a day (QID) | ORAL | Status: DC | PRN

## 2015-04-16 MED ORDER — GABAPENTIN 100 MG PO CAPS
100.0000 mg | ORAL_CAPSULE | Freq: Three times a day (TID) | ORAL | Status: DC
  Administered 2015-04-16 – 2015-04-22 (×17): 100 mg via ORAL
  Filled 2015-04-16 (×17): qty 1

## 2015-04-16 NOTE — ED Provider Notes (Signed)
CSN: [id number]     Arrival date & time 04/16/15  4098 History   First MD Initiated Contact with Patient 04/16/15 1034     Chief Complaint  Patient presents with  . Abnormal Lab    low sodium      HPI  Pt was seen at 1045. Per pt, c/o gradual onset and persistence of constant "fatigue" for the past few weeks. Pt was evaluated by her PMD yesterday, had labs drawn. Pt was called today, told her "sodium was low," and to come to the ED for further evaluation/admission. Denies any specific symptoms. Denies CP/palpitations, no SOB/cough, no abd pain, no N/V/D, no fevers, no rash, no focal motor weakness, no tingling/numbness in extremities.   Past Medical History  Diagnosis Date  . Hypertension   . Enteritis presumed infectious   . Crohn's   . Anemia   . Hepatitis C antibody test positive   . Hepatitis   . Renal insufficiency   . Cirrhosis (HCC)   . Splenomegaly   . Bilateral leg edema 01/2014  . Chronic back pain   . Thrombocytopenia [facility])    Past Surgical History  Procedure Laterality Date  . Colon surgery      MULTIPLE SURGERIES FOR CROHNS  . Ileostomy      multiple abdominal surgeries for Crohn's by Dr. Gabriel Cirri  . Cholecystectomy    . Abscess drainage      abdominal  . Esophagogastroduodenoscopy  10/05/2010    Procedure: ESOPHAGOGASTRODUODENOSCOPY (EGD);  Surgeon: [name], MD;  [facility];  Service: Endoscopy;  Laterality: N/A;  7:30 am  . Cirrhosis    . Esophagogastroduodenoscopy N/A 09/11/2013    Procedure: ESOPHAGOGASTRODUODENOSCOPY (EGD);  Surgeon: [name], MD;  [facility];  Service: Endoscopy;  Laterality: N/A;  . Ileoscopy  09/11/2013    Procedure: ILEOSCOPY THROUGH STOMA;  Surgeon: [name], MD;  [facility];  Service: Endoscopy;;  . Givens capsule study N/A 09/12/2013    Procedure: GIVENS CAPSULE STUDY;  Surgeon: [name], MD;  [facility];  Service: Endoscopy;  Laterality: N/A;  . Givens  capsule study N/A 11/25/2013    Procedure: GIVENS CAPSULE STUDY;  Surgeon: [name], MD;  [facility];  Service: Endoscopy;  Laterality: N/A;  . Varices cauterization Right     in the stoma of her ileostomy  . Tips procedure  11/2013    [facility]    Family History  Problem Relation Age of Onset  . Cancer Mother   . Stroke Father    Social History  Substance Use Topics  . Smoking status: Never Smoker   . Smokeless tobacco: Never Used  . Alcohol Use: No    Review of Systems ROS: Statement: All systems negative except as marked or noted in the HPI; Constitutional: Negative for fever and chills. +generalized fatigue. ; ; Eyes: Negative for eye pain, redness and discharge. ; ; ENMT: Negative for ear pain, hoarseness, nasal congestion, sinus pressure and sore throat. ; ; Cardiovascular: Negative for chest pain, palpitations, diaphoresis, dyspnea and peripheral edema. ; ; Respiratory: Negative for cough, wheezing and stridor. ; ; Gastrointestinal: Negative for nausea, vomiting, diarrhea, abdominal pain, blood in stool, hematemesis, jaundice and rectal bleeding. . ; ; Genitourinary: Negative for dysuria, flank pain and hematuria. ; ; Musculoskeletal: Negative for back pain and neck pain. Negative for swelling and trauma.; ; Skin: Negative for pruritus, rash, abrasions, blisters, bruising and skin lesion.; ;  Neuro: Negative for headache, lightheadedness and neck stiffness. Negative for altered level of consciousness , altered mental status, extremity weakness, paresthesias, involuntary movement, seizure and syncope.      Allergies  Penicillins  Home Medications   Prior to Admission medications   Medication Sig Start Date End Date Taking? Authorizing Provider  ALPRAZolam Prudy Feeler) 1 MG tablet Take 1 tablet (1 mg total) by mouth at bedtime as needed. Patient taking differently: Take 1 mg by mouth at bedtime as needed for sleep.  01/29/15  Yes Junie Spencer, FNP  Cholecalciferol  (VITAMIN D-1000 MAX ST) 1000 UNITS tablet Take 1,000 Units by mouth daily.   Yes Historical Provider, MD  ciprofloxacin (CIPRO) 500 MG tablet Take 1 tablet (500 mg total) by mouth 2 (two) times daily. 04/15/15  Yes Johna Sheriff, MD  feeding supplement, ENSURE ENLIVE, (ENSURE ENLIVE) LIQD Take 237 mLs by mouth daily. 03/27/15  Yes Standley Brooking, MD  gabapentin (NEURONTIN) 100 MG capsule Take 1 capsule (100 mg total) by mouth 3 (three) times daily. 10/23/14  Yes Ernestina Penna, MD  lactulose (CHRONULAC) 10 GM/15ML solution Take 30 mLs (20 g total) by mouth 2 (two) times daily. 03/27/15  Yes Standley Brooking, MD  Multiple Vitamins-Minerals (MULTIVITAMIN WITH MINERALS) tablet Take 1 tablet by mouth daily.   Yes Historical Provider, MD  nadolol (CORGARD) 20 MG tablet Take 20 mg by mouth daily.   Yes Historical Provider, MD  pantoprazole (PROTONIX) 40 MG tablet TAKE 1 TABLET BY MOUTH DAILY 06/09/14  Yes Len Blalock, NP  potassium chloride (K-DUR) 10 MEQ tablet Take 1 tablet (10 mEq total) by mouth daily. 04/03/15  Yes Junie Spencer, FNP  Probiotic Product (PROBIOTIC PO) Take 1 tablet by mouth daily.   Yes Historical Provider, MD  hydrocortisone cream 1 % Apply 1 application topically 2 (two) times daily. Reported on 04/16/2015    Historical Provider, MD   BP 94/54 mmHg  Pulse 57  Temp(Src) 98.1 F (36.7 C) (Oral)  Resp 17  Ht 5\' 5"  (1.651 m)  Wt 200 lb (90.719 kg)  BMI 33.28 kg/m2  SpO2 100%  Patient Vitals for the past 24 hrs:  BP Temp Temp src Pulse Resp SpO2 Height Weight  04/16/15 1130 (!) 94/54 mmHg - - (!) 57 17 100 % - -  04/16/15 1019 98/74 mmHg 98.1 F (36.7 C) Oral (!) 59 16 100 % 5\' 5"  (1.651 m) 200 lb (90.719 kg)     Physical Exam  1045: Physical examination:  Nursing notes reviewed; Vital signs and O2 SAT reviewed;  Constitutional: Well developed, Well nourished, In no acute distress; Head:  Normocephalic, atraumatic; Eyes: EOMI, PERRL, No scleral icterus; ENMT: Mouth and  pharynx normal, Mucous membranes dry; Neck: Supple, Full range of motion, No lymphadenopathy; Cardiovascular: Regular rate and rhythm, No gallop; Respiratory: Breath sounds clear & equal bilaterally, No wheezes.  Speaking full sentences with ease, Normal respiratory effort/excursion; Chest: Nontender, Movement normal; Abdomen: Soft, Nontender, Nondistended, Normal bowel sounds; Genitourinary: No CVA tenderness; Extremities: Pulses normal, No tenderness, No edema, No calf edema or asymmetry.; Neuro: AA&Ox3, Major CN grossly intact.  Speech clear. No gross focal motor or sensory deficits in extremities.; Skin: Color normal, Warm, Dry.   ED Course  Procedures (including critical care time) Labs Review  Imaging Review  I have personally reviewed and evaluated these images and lab results as part of my medical decision-making.   EKG Interpretation   Date/Time:  Thursday April 16 2015  10:48:51 EST Ventricular Rate:  57 PR Interval:  174 QRS Duration: 96 QT Interval:  458 QTC Calculation: 446 R Axis:   57 Text Interpretation:  Sinus or ectopic atrial rhythm Low voltage,  precordial leads Baseline wander When compared with ECG of 03/26/2015 No  significant change was found Confirmed by [facility]  MD, Nicholos Johns [phone]) on  04/16/2015 11:04:16 AM      MDM  MDM Reviewed: previous chart, nursing note and vitals Reviewed previous: labs and ECG Interpretation: labs and ECG Total time providing critical care: 30-74 minutes. This excludes time spent performing separately reportable procedures and services. Consults: admitting MD     CRITICAL CARE Performed by: Laray Anger Total critical care time: 35 minutes Critical care time was exclusive of separately billable procedures and treating other patients. Critical care was necessary to treat or prevent imminent or life-threatening deterioration. Critical care was time spent personally by me on the following activities: development of  treatment plan with patient and/or surrogate as well as nursing, discussions with consultants, evaluation of patient's response to treatment, examination of patient, obtaining history from patient or surrogate, ordering and performing treatments and interventions, ordering and review of laboratory studies, ordering and review of radiographic studies, pulse oximetry and re-evaluation of patient's condition.  Results for orders placed or performed during the hospital encounter of 04/16/15  CBC with Differential  Result Value Ref Range   WBC 10.0 4.0 - 10.5 K/uL   RBC 4.02 3.87 - 5.11 MIL/uL   Hemoglobin 12.2 12.0 - 15.0 g/dL   HCT 60.4 (L) 54.0 - 98.1 %   MCV 81.8 78.0 - 100.0 fL   MCH 30.3 26.0 - 34.0 pg   MCHC 37.1 (H) 30.0 - 36.0 g/dL   RDW 19.1 47.8 - 29.5 %   Platelets 104 (L) 150 - 400 K/uL   Neutrophils Relative % 73 %   Neutro Abs 7.3 1.7 - 7.7 K/uL   Lymphocytes Relative 18 %   Lymphs Abs 1.8 0.7 - 4.0 K/uL   Monocytes Relative 8 %   Monocytes Absolute 0.8 0.1 - 1.0 K/uL   Eosinophils Relative 1 %   Eosinophils Absolute 0.1 0.0 - 0.7 K/uL   Basophils Relative 0 %   Basophils Absolute 0.0 0.0 - 0.1 K/uL  Comprehensive metabolic panel  Result Value Ref Range   Sodium 115 (LL) 135 - 145 mmol/L   Potassium 2.8 (L) 3.5 - 5.1 mmol/L   Chloride 93 (L) 101 - 111 mmol/L   CO2 18 (L) 22 - 32 mmol/L   Glucose, Bld 125 (H) 65 - 99 mg/dL   BUN 43 (H) 6 - 20 mg/dL   Creatinine, Ser 6.21 (H) 0.44 - 1.00 mg/dL   Calcium 8.7 (L) 8.9 - 10.3 mg/dL   Total Protein 5.8 (L) 6.5 - 8.1 g/dL   Albumin 2.9 (L) 3.5 - 5.0 g/dL   AST 38 15 - 41 U/L   ALT 24 14 - 54 U/L   Alkaline Phosphatase 164 (H) 38 - 126 U/L   Total Bilirubin 1.9 (H) 0.3 - 1.2 mg/dL   GFR calc non Af Amer 38 (L) >60 mL/min   GFR calc Af Amer 44 (L) >60 mL/min   Anion gap 4 (L) 5 - 15  Ammonia  Result Value Ref Range   Ammonia 24 9 - 35 umol/L  Troponin I  Result Value Ref Range   Troponin I 0.10 (H) <0.031 ng/mL   Protime-INR  Result Value Ref Range   Prothrombin  Time 17.5 (H) 11.6 - 15.2 seconds   INR 1.43 0.00 - 1.49    1225:  New hyponatremia on labs. Judicious IVF given for soft BP.  BUN/Cr per baseline. Potassium repleted PO. Troponin elevated, but no acute STTW changes on EKG and pt denies CP. Dx and testing d/w pt and family.  Questions answered.  Verb understanding, agreeable to admit. T/C to [facility]. Kerry Hough, case discussed, including:  HPI, pertinent PM/SHx, VS/PE, dx testing, ED course and treatment:  Agreeable to admit, requests to write temporary orders, obtain tele bed to team [facility].     Samuel Jester, DO 04/20/15 1612

## 2015-04-16 NOTE — ED Notes (Signed)
CRITICAL VALUE ALERT  Critical value received:  Sodium  Date of notification:  04/16/15  Time of notification:  1110  Critical value read back:Yes.    Nurse who received alert:  Earl Gala, RN  MD notified (1st page):  Dr Clarene Duke  Time of first page:  1116

## 2015-04-16 NOTE — Progress Notes (Signed)
Pt family member stated patient was complaining of being SOB.  Pt stated she gets SOB at times. Not on O2 at this time. Check O2 and was 97%. RN will continue to monitor. Lesly Dukes, RN

## 2015-04-16 NOTE — H&P (Signed)
[facility] History and Physical  Kathryn [name]:[id number] DOB: 05-Oct-1955 DOA: 04/16/2015  Referring physician: Dr. Clarene Duke PCP: [name], FNP   Chief Complaint: fatigue  HPI: Kathryn [name] is a 60 y.o. female  With a history of hepC cirrhosis s/p treatment with harvoni, Crohns disease s/p bowel resection with ileostomy, who had presented to her PCP office yesterday with 2-3 days of fatigue/generalized weakness and generally not feeling well. PCP had done labs and she was found to be hyponatremic with sodium of 113. She reports adequate po intake lately. She denies any vomiting, or increased output from ostomy. She is not on any diuretics and drinks 5-6 12 oz glasses of water every day. She has not had any fever, cough or shortness of breath. She will be admitted for further treatment.   Review of Systems:  Pertinent positives as per HPI, otherwise negative   Past Medical History  Diagnosis Date  . Hypertension   . Enteritis presumed infectious   . Crohn's   . Anemia   . Hepatitis C antibody test positive   . Hepatitis   . Renal insufficiency   . Cirrhosis (HCC)   . Splenomegaly   . Bilateral leg edema 01/2014  . Chronic back pain   . Thrombocytopenia [facility])    Past Surgical History  Procedure Laterality Date  . Colon surgery      MULTIPLE SURGERIES FOR CROHNS  . Ileostomy      multiple abdominal surgeries for Crohn's by Dr. Gabriel Cirri  . Cholecystectomy    . Abscess drainage      abdominal  . Esophagogastroduodenoscopy  10/05/2010    Procedure: ESOPHAGOGASTRODUODENOSCOPY (EGD);  Surgeon: [name], MD;  [facility];  Service: Endoscopy;  Laterality: N/A;  7:30 am  . Cirrhosis    . Esophagogastroduodenoscopy N/A 09/11/2013    Procedure: ESOPHAGOGASTRODUODENOSCOPY (EGD);  Surgeon: [name], MD;  [facility];  Service: Endoscopy;  Laterality: N/A;  . Ileoscopy  09/11/2013    Procedure: ILEOSCOPY THROUGH STOMA;  Surgeon: [name], MD;  [facility];  Service: Endoscopy;;  . Givens capsule study N/A 09/12/2013    Procedure: GIVENS CAPSULE STUDY;  Surgeon: [name], MD;  [facility];  Service: Endoscopy;  Laterality: N/A;  . Givens capsule study N/A 11/25/2013    Procedure: GIVENS CAPSULE STUDY;  Surgeon: [name], MD;  [facility];  Service: Endoscopy;  Laterality: N/A;  . Varices cauterization Right     in the stoma of her ileostomy  . Tips procedure  11/2013    [facility]    Social History:  reports that she has never smoked. She has never used smokeless tobacco. She reports that she does not drink alcohol or use illicit drugs.  Allergies  Allergen Reactions  . Penicillins Rash    Has patient had a PCN reaction causing immediate rash, facial/tongue/throat swelling, SOB or lightheadedness with hypotension: Yes Has patient had a PCN reaction causing severe rash involving mucus membranes or skin necrosis: No Has patient had a PCN reaction that required hospitalization No Has patient had a PCN reaction occurring within the last 10 years: No If all of the above answers are "NO", then may proceed with Cephalosporin use.     Family History  Problem Relation Age of Onset  . Cancer Mother   . Stroke Father     Prior to Admission medications   Medication Sig Start Date End  Date Taking? Authorizing Provider  ALPRAZolam Prudy Feeler) 1 MG tablet Take 1 tablet (1 mg total) by mouth at bedtime as needed. Patient taking differently: Take 1 mg by mouth at bedtime as needed for sleep.  01/29/15  Yes Junie Spencer, FNP  Cholecalciferol (VITAMIN D-1000 MAX ST) 1000 UNITS tablet Take 1,000 Units by mouth daily.   Yes Historical Provider, MD  ciprofloxacin (CIPRO) 500 MG tablet Take 1 tablet (500 mg total) by mouth 2 (two) times daily. 04/15/15  Yes Johna Sheriff, MD  feeding supplement, ENSURE ENLIVE, (ENSURE ENLIVE) LIQD Take 237 mLs by mouth daily. 03/27/15  Yes Standley Brooking,  MD  gabapentin (NEURONTIN) 100 MG capsule Take 1 capsule (100 mg total) by mouth 3 (three) times daily. 10/23/14  Yes Ernestina Penna, MD  lactulose (CHRONULAC) 10 GM/15ML solution Take 30 mLs (20 g total) by mouth 2 (two) times daily. 03/27/15  Yes Standley Brooking, MD  Multiple Vitamins-Minerals (MULTIVITAMIN WITH MINERALS) tablet Take 1 tablet by mouth daily.   Yes Historical Provider, MD  nadolol (CORGARD) 20 MG tablet Take 20 mg by mouth daily.   Yes Historical Provider, MD  pantoprazole (PROTONIX) 40 MG tablet TAKE 1 TABLET BY MOUTH DAILY 06/09/14  Yes Len Blalock, NP  potassium chloride (K-DUR) 10 MEQ tablet Take 1 tablet (10 mEq total) by mouth daily. 04/03/15  Yes Junie Spencer, FNP  Probiotic Product (PROBIOTIC PO) Take 1 tablet by mouth daily.   Yes Historical Provider, MD  hydrocortisone cream 1 % Apply 1 application topically 2 (two) times daily. Reported on 04/16/2015    Historical Provider, MD   Physical Exam: Filed Vitals:   04/16/15 1130 04/16/15 1330 04/16/15 1400 04/16/15 1509  BP: 94/54 99/56 105/84 108/62  Pulse: 57 58 57 59  Temp:    98 F (36.7 C)  TempSrc:    Oral  Resp: 17 24 17    Height:    5\' 5"  (1.651 m)  Weight:    89.223 kg (196 lb 11.2 oz)  SpO2: 100% 97% 97% 100%    Wt Readings from Last 3 Encounters:  04/16/15 89.223 kg (196 lb 11.2 oz)  04/15/15 91.082 kg (200 lb 12.8 oz)  04/02/15 94.802 kg (209 lb)    General:  Appears calm and comfortable Eyes: PERRL, normal lids, irises & conjunctiva ENT: grossly normal hearing, lips & tongue Neck: no LAD, masses or thyromegaly Cardiovascular: RRR, no m/r/g. No LE edema. Telemetry: SR, no arrhythmias  Respiratory: CTA bilaterally, no w/r/r. Normal respiratory effort. Abdomen: soft, ntnd, ileostomy in RLQ Skin: no rash or induration seen on limited exam Musculoskeletal: grossly normal tone BUE/BLE Psychiatric: grossly normal mood and affect, speech fluent and appropriate Neurologic: grossly non-focal.            Labs on Admission:  Basic Metabolic Panel:  Recent Labs Lab 04/15/15 1132 04/16/15 1035  NA 113* 115*  K 3.4* 2.8*  CL 82* 93*  CO2 15* 18*  GLUCOSE 103* 125*  BUN 37* 43*  CREATININE 1.49* 1.49*  CALCIUM 9.0 8.7*  MG  --  1.8   Liver Function Tests:  Recent Labs Lab 04/15/15 1132 04/16/15 1035  AST 39 38  ALT 25 24  ALKPHOS 189* 164*  BILITOT 2.2* 1.9*  PROT 5.8* 5.8*  ALBUMIN 3.3* 2.9*   No results for input(s): LIPASE, AMYLASE in the last 168 hours.  Recent Labs Lab 04/16/15 1035  AMMONIA 24   CBC:  Recent Labs Lab 04/15/15 1132  04/16/15 1035  WBC 11.6* 10.0  NEUTROABS 7.6* 7.3  HGB  --  12.2  HCT 37.2 32.9*  MCV 86 81.8  PLT 137* 104*   Cardiac Enzymes:  Recent Labs Lab 04/16/15 1035  TROPONINI 0.10*    BNP (last 3 results)  Recent Labs  03/26/15 0345  BNP 74.0    ProBNP (last 3 results) No results for input(s): PROBNP in the last 8760 hours.  CBG: No results for input(s): GLUCAP in the last 168 hours.  Radiological Exams on Admission: No results found.  EKG: Independently reviewed. No acute changes  Assessment/Plan Principal Problem:   Hyponatremia Active Problems:   Hepatic cirrhosis (HCC)   GERD (gastroesophageal reflux disease)   Peripheral neuropathy (HCC)   Thrombocytopenia (HCC)   Weakness   CKD (chronic kidney disease), stage III   Hypokalemia   Elevated troponin   1. Hyponatremia. Etiology is not entirely clear. She may be having increased output from ostomy, also with her history of cirrhosis, SIADH is another possibility. Will start on saline infusion and follow labs. Check serum osm, urine osm, urine sodium, tsh 2. CKD stage 3. Creatinine appears to be near baseline. Continue to follow 3. Crohn'disease with ileostomy. She denies abdominal pain. Continue ostomy care 4. Hep C cirrhosis. LFts are stable. Follow up with gastroenterology 5. Pyuria. Urinalysis from PCP indicated possible infection. This  will be repeated and will check urine culture. She does not appear to be having symptoms at this time. 6. Hypokalemia. Replace 7. Elevated troponin. Suspect demand ischemia. EKG is non acute and she is not having any chest pain. Continue to cycle troponin and monitor on telemetry 8. Peripheral neuropathy. Continue gabapentin 9. Thrombocytopenia. Related to cirrhosis. Continue to follow. 10. GERD. Continue PPI    Code Status: full code DVT Prophylaxis: scd Family Communication: discussed with patient and family Disposition Plan: discharge home once improved  Time spent: 60 minutes  Erick Blinks, MD. [facility] Pager [phone]

## 2015-04-16 NOTE — ED Notes (Signed)
Was called by [facility] with low sodium.  Told to come to ED.

## 2015-04-16 NOTE — Telephone Encounter (Signed)
Sodium level 113. Called and talked with pt, she is going to call a friend to drive her to the ED. Her UTI symptoms are better. Still feeling weak and tired. H/o cirrhosis, has had to be hospitalized for hyponatremia in the past.

## 2015-04-16 NOTE — ED Notes (Signed)
Attempted to call report. RN "at lunch, and no other nurse's available" per staff on phone. Will attempt to call back.

## 2015-04-17 LAB — BASIC METABOLIC PANEL
ANION GAP: 5 (ref 5–15)
BUN: 37 mg/dL — ABNORMAL HIGH (ref 6–20)
CO2: 17 mmol/L — ABNORMAL LOW (ref 22–32)
Calcium: 8.7 mg/dL — ABNORMAL LOW (ref 8.9–10.3)
Chloride: 105 mmol/L (ref 101–111)
Creatinine, Ser: 1.54 mg/dL — ABNORMAL HIGH (ref 0.44–1.00)
GFR calc Af Amer: 42 mL/min — ABNORMAL LOW (ref >60)
GFR, EST NON AFRICAN AMERICAN: 36 mL/min — AB (ref >60)
GLUCOSE: 102 mg/dL — AB (ref 65–99)
POTASSIUM: 4.2 mmol/L (ref 3.5–5.1)
Sodium: 127 mmol/L — ABNORMAL LOW (ref 135–145)

## 2015-04-17 LAB — URINALYSIS, ROUTINE W REFLEX MICROSCOPIC
Bilirubin Urine: NEGATIVE
GLUCOSE, UA: NEGATIVE mg/dL
KETONES UR: NEGATIVE mg/dL
Nitrite: NEGATIVE
PH: 6.5 (ref 5.0–8.0)
PROTEIN: NEGATIVE mg/dL

## 2015-04-17 LAB — CBC
HEMATOCRIT: 31.9 % — AB (ref 36.0–46.0)
HEMOGLOBIN: 11.3 g/dL — AB (ref 12.0–15.0)
MCH: 29.5 pg (ref 26.0–34.0)
MCHC: 35.4 g/dL (ref 30.0–36.0)
MCV: 83.3 fL (ref 78.0–100.0)
Platelets: 112 10*3/uL — ABNORMAL LOW (ref 150–400)
RBC: 3.83 MIL/uL — ABNORMAL LOW (ref 3.87–5.11)
RDW: 14.3 % (ref 11.5–15.5)
WBC: 9.6 10*3/uL (ref 4.0–10.5)

## 2015-04-17 LAB — URINE MICROSCOPIC-ADD ON

## 2015-04-17 LAB — OSMOLALITY, URINE: OSMOLALITY UR: 309 mosm/kg (ref 300–900)

## 2015-04-17 LAB — SODIUM, URINE, RANDOM

## 2015-04-17 LAB — TROPONIN I
TROPONIN I: 0.07 ng/mL — AB (ref <0.031)
Troponin I: 0.06 ng/mL — ABNORMAL HIGH (ref <0.031)

## 2015-04-17 LAB — OSMOLALITY: Osmolality: 272 mOsm/kg — ABNORMAL LOW (ref 275–295)

## 2015-04-17 MED ORDER — CIPROFLOXACIN IN D5W 200 MG/100ML IV SOLN
200.0000 mg | Freq: Two times a day (BID) | INTRAVENOUS | Status: DC
  Administered 2015-04-17 – 2015-04-19 (×4): 200 mg via INTRAVENOUS
  Filled 2015-04-17 (×6): qty 100

## 2015-04-17 MED ORDER — SODIUM BICARBONATE 8.4 % IV SOLN
INTRAVENOUS | Status: DC
  Administered 2015-04-17: 17:00:00 via INTRAVENOUS
  Filled 2015-04-17 (×6): qty 1000

## 2015-04-17 NOTE — Progress Notes (Signed)
[facility] PROGRESS NOTE  Kathryn [name] DGU:[id number] DOB: July 24, 1955 DOA: 04/16/2015 PCP: [name], FNP  Assessment/Plan: 1. Hyponatremia. Possibly related to volume depletion. Appears to be improving with normal saline. I suspect that she may have more output from ostomy and is not keeping up with her po intake. Improving. Continue on IVF and follow labs. Urine sodium <10. TSH 0.728. 2. CKD stage 3. Creatinine appears to be near baseline. Continue to follow 3. Crohn'disease with ileostomy. She denies abdominal pain. Continue ostomy care 4. Hep C cirrhosis. LFts are stable. Follow up with gastroenterology 5. UTI. Urinalysis revealed many bacteria and TNTC WBC. Urine culture in progress. She'll be started on Cipro  6. Hypokalemia. Replaced.  7. Elevated troponin. Suspect demand ischemia. EKG is non acute and she is not having any chest pain. Troponin trending down. Continue on telemetry 8. Peripheral neuropathy. Continue gabapentin 9. Thrombocytopenia. Related to cirrhosis. Continue to follow. 10. GERD. Continue PPI  Code Status: Full DVT prophylaxis: SCDs Family Communication: no family at bedside  Disposition Plan: Anticipate discharge in 24 hours.  Consultants:  none  Procedures:  none  Antibiotics:  Cipro 3/10 >>  HPI/Subjective: Feels better today. Is still having trouble breathing, and complains that it has been going for about a week. She states that it is easier to breath when laying flat and that it is about the same when moving. She also complains of weakness today.  Objective: Filed Vitals:   04/16/15 2025 04/17/15 0549  BP: 104/75 101/68  Pulse: 62 66  Temp: 98.2 F (36.8 C) 98.1 F (36.7 C)  Resp: 20 20    Intake/Output Summary (Last 24 hours) at 04/17/15 0700 Last data filed at 04/17/15 0550  Gross per 24 hour  Intake   1065 ml  Output    500 ml  Net    565 ml   Filed Weights   04/16/15 1019 04/16/15 1509  Weight: 90.719 kg (200 lb)  89.223 kg (196 lb 11.2 oz)    Exam:  General: NAD, looks comfortable Cardiovascular: RRR, S1, S2  Respiratory: clear bilaterally, No wheezing, rales or rhonchi Abdomen: soft, non tender, no distention , bowel sounds normal Musculoskeletal: No edema b/l  Data Reviewed: Basic Metabolic Panel:  Recent Labs Lab 04/15/15 1132 04/16/15 1035  NA 113* 115*  K 3.4* 2.8*  CL 82* 93*  CO2 15* 18*  GLUCOSE 103* 125*  BUN 37* 43*  CREATININE 1.49* 1.49*  CALCIUM 9.0 8.7*  MG  --  1.8   Liver Function Tests:  Recent Labs Lab 04/15/15 1132 04/16/15 1035  AST 39 38  ALT 25 24  ALKPHOS 189* 164*  BILITOT 2.2* 1.9*  PROT 5.8* 5.8*  ALBUMIN 3.3* 2.9*   No results for input(s): LIPASE, AMYLASE in the last 168 hours.  Recent Labs Lab 04/16/15 1035  AMMONIA 24   CBC:  Recent Labs Lab 04/15/15 1132 04/16/15 1035  WBC 11.6* 10.0  NEUTROABS 7.6* 7.3  HGB  --  12.2  HCT 37.2 32.9*  MCV 86 81.8  PLT 137* 104*   Cardiac Enzymes:  Recent Labs Lab 04/16/15 1035 04/16/15 1947 04/17/15 0110  TROPONINI 0.10* 0.07* 0.06*   BNP (last 3 results)  Recent Labs  03/26/15 0345  BNP 74.0    ProBNP (last 3 results) No results for input(s): PROBNP in the last 8760 hours.  CBG: No results for input(s): GLUCAP in the last 168 hours.  Recent Results (from the past 240 hour(s))  Urine culture  Status: None (Preliminary result)   Collection Time: 04/15/15 11:32 AM  Result Value Ref Range Status   Urine Culture, Routine Preliminary report  Preliminary   Urine Culture result 1 Comment  Preliminary    Comment: Microbiological testing to rule out the presence of possible pathogens is in progress. 50,000-100,000 colony forming units per mL   Microscopic Examination     Status: Abnormal   Collection Time: 04/15/15  2:22 PM  Result Value Ref Range Status   WBC, UA >30 (H) 0 -  5 /hpf Final   RBC, UA >30 (H) 0 -  2 /hpf Final   Epithelial Cells (non renal) 0-10 0 - 10  /hpf Final   Bacteria, UA Many (A) None seen/Few Final     Studies: Dg Chest Port 1 View  04/16/2015  CLINICAL DATA:  Hyponatremia.  Short of breath EXAM: PORTABLE CHEST 1 VIEW COMPARISON:  03/26/2015 FINDINGS: The heart size and mediastinal contours are within normal limits. Both lungs are clear. The visualized skeletal structures are unremarkable. IMPRESSION: No active disease. Electronically Signed   By: Marlan Palau M.D.   On: 04/16/2015 20:05    Scheduled Meds: . feeding supplement (ENSURE ENLIVE)  237 mL Oral Daily  . gabapentin  100 mg Oral TID  . lactulose  20 g Oral BID  . nadolol  20 mg Oral Daily  . pantoprazole  40 mg Oral Daily  . sodium chloride flush  3 mL Intravenous Q12H   Continuous Infusions: . 0.9 % NaCl with KCl 40 mEq / L 100 mL/hr (04/17/15 0617)    Principal Problem:   Hyponatremia Active Problems:   Hepatic cirrhosis (HCC)   GERD (gastroesophageal reflux disease)   Peripheral neuropathy (HCC)   Thrombocytopenia (HCC)   Weakness   CKD (chronic kidney disease), stage III   Hypokalemia   Elevated troponin    Time spent: 25 minutes    Erick Blinks, MD. [facility] Pager [phone]. If 7PM-7AM, please contact night-coverage at [email or web address], password [facility] 04/17/2015, 7:00 AM  LOS: 1 day

## 2015-04-17 NOTE — Care Management Note (Signed)
Case Management Note  Patient Details  Name: ISHAANI ELLINGSWORTH MRN: [id number] Date of Birth: [birth year]  Subjective/Objective:                  Pt admitted with hyponatremia. Pt is from home, lives with her husband and is ind with ADL's. Pt has no assistive devices at home prior to admission. Pt has been referred for OP PT on multiple occasions in the past to [facility] (at pts choice of facility) and has never received call to schedule appointment. Pt says she would like to be referred to another OP PT site and has chosen [facility] in [place]. Pt feels the OP PT may be able to help her better manage her chronic pain.   Action/Plan: Pt plans to return home with husband at DC. Pt says she does not need cane or walker. MD signed referral and order faxed to OP facility. No further CM needs.   Expected Discharge Date:       04/21/2015           Expected Discharge Plan:  Home/Self Care  In-House Referral:  NA  Discharge planning Services  CM Consult  Post Acute Care Choice:  NA Choice offered to:  NA  DME Arranged:    DME Agency:     HH Arranged:    HH Agency:     Status of Service:  Completed, signed off  Medicare Important Message Given:  Yes Date Medicare IM Given:    Medicare IM give by:    Date Additional Medicare IM Given:    Additional Medicare Important Message give by:     If discussed at Long Length of Stay Meetings, dates discussed:    Additional Comments:  Malcolm Metro, RN 04/17/2015, 12:40 PM

## 2015-04-17 NOTE — Care Management Important Message (Signed)
Important Message  Patient Details  Name: Kathryn [name] MRN: [id number] Date of Birth: 12/06/1955   Medicare Important Message Given:  Yes    Malcolm Metro, RN 04/17/2015, 12:40 PM

## 2015-04-18 DIAGNOSIS — K7469 Other cirrhosis of liver: Secondary | ICD-10-CM

## 2015-04-18 LAB — BASIC METABOLIC PANEL
Anion gap: 4 — ABNORMAL LOW (ref 5–15)
BUN: 33 mg/dL — AB (ref 6–20)
CHLORIDE: 104 mmol/L (ref 101–111)
CO2: 18 mmol/L — AB (ref 22–32)
CREATININE: 1.69 mg/dL — AB (ref 0.44–1.00)
Calcium: 8.3 mg/dL — ABNORMAL LOW (ref 8.9–10.3)
GFR calc Af Amer: 37 mL/min — ABNORMAL LOW (ref >60)
GFR calc non Af Amer: 32 mL/min — ABNORMAL LOW (ref >60)
GLUCOSE: 165 mg/dL — AB (ref 65–99)
POTASSIUM: 3.7 mmol/L (ref 3.5–5.1)
Sodium: 126 mmol/L — ABNORMAL LOW (ref 135–145)

## 2015-04-18 LAB — URINE CULTURE

## 2015-04-18 MED ORDER — FUROSEMIDE 10 MG/ML IJ SOLN
40.0000 mg | Freq: Every day | INTRAMUSCULAR | Status: DC
  Administered 2015-04-18 – 2015-04-20 (×3): 40 mg via INTRAVENOUS
  Filled 2015-04-18 (×3): qty 4

## 2015-04-18 NOTE — Progress Notes (Signed)
[facility] PROGRESS NOTE  Kathryn [name] ZOX:[id number] DOB: 1955-12-26 DOA: 04/16/2015 PCP: [name], FNP  Assessment/Plan: 1. Hyponatremia. Possibly related to volume depletion vs. SIADH. Appears to be improving with IV fluids. Continue current treatments and add low dose lasix. Urine sodium <10. TSH 0.728. 2. CKD stage 3. Creatinine trending up with hydration. Will add low dose lasix. Continue to follow 3. Crohn'disease with ileostomy. She denies abdominal pain. Continue ostomy care 4. Hep C cirrhosis. LFts are stable. Follow up with gastroenterology 5. UTI. Urinalysis revealed many bacteria and TNTC WBC. Urine culture in progress. Continue Cipro  6. Hypokalemia. Replaced. Improved. 7. Elevated troponin. Suspect demand ischemia. EKG is non acute and she is not having any chest pain. Troponin trending down. Continue on telemetry 8. Peripheral neuropathy. Continue gabapentin. 9. Thrombocytopenia. Related to cirrhosis. Continue to follow. 10. GERD. Continue PPI  Code Status: Full DVT prophylaxis: SCDs Family Communication: discussed with family at bedside Disposition Plan: Anticipate discharge within 24 hours.  Consultants:  none  Procedures:  none  Antibiotics:  Cipro 3/10 >>  HPI/Subjective: Feels weak. Denies any shortness of breath. No chest pain.  Objective: Filed Vitals:   04/17/15 2148 04/18/15 0535  BP: 104/50 111/46  Pulse: 64 68  Temp: 97.9 F (36.6 C) 98.1 F (36.7 C)  Resp: 18 18    Intake/Output Summary (Last 24 hours) at 04/18/15 0749 Last data filed at 04/17/15 1300  Gross per 24 hour  Intake    480 ml  Output      0 ml  Net    480 ml   Filed Weights   04/16/15 1019 04/16/15 1509  Weight: 90.719 kg (200 lb) 89.223 kg (196 lb 11.2 oz)    Exam:  General: NAD, looks comfortable Cardiovascular: RRR, S1, S2  Respiratory: clear bilaterally, No wheezing, rales or rhonchi Abdomen: soft, non tender, no distention , bowel sounds  normal Musculoskeletal: trace edema b/l  Data Reviewed: Basic Metabolic Panel:  Recent Labs Lab 04/15/15 1132 04/16/15 1035 04/17/15 0625  NA 113* 115* 127*  K 3.4* 2.8* 4.2  CL 82* 93* 105  CO2 15* 18* 17*  GLUCOSE 103* 125* 102*  BUN 37* 43* 37*  CREATININE 1.49* 1.49* 1.54*  CALCIUM 9.0 8.7* 8.7*  MG  --  1.8  --    Liver Function Tests:  Recent Labs Lab 04/15/15 1132 04/16/15 1035  AST 39 38  ALT 25 24  ALKPHOS 189* 164*  BILITOT 2.2* 1.9*  PROT 5.8* 5.8*  ALBUMIN 3.3* 2.9*   No results for input(s): LIPASE, AMYLASE in the last 168 hours.  Recent Labs Lab 04/16/15 1035  AMMONIA 24   CBC:  Recent Labs Lab 04/15/15 1132 04/16/15 1035 04/17/15 0625  WBC 11.6* 10.0 9.6  NEUTROABS 7.6* 7.3  --   HGB  --  12.2 11.3*  HCT 37.2 32.9* 31.9*  MCV 86 81.8 83.3  PLT 137* 104* 112*   Cardiac Enzymes:  Recent Labs Lab 04/16/15 1035 04/16/15 1947 04/17/15 0110 04/17/15 0625  TROPONINI 0.10* 0.07* 0.06* 0.07*   BNP (last 3 results)  Recent Labs  03/26/15 0345  BNP 74.0    ProBNP (last 3 results) No results for input(s): PROBNP in the last 8760 hours.  CBG: No results for input(s): GLUCAP in the last 168 hours.  Recent Results (from the past 240 hour(s))  Urine culture     Status: Abnormal (Preliminary result)   Collection Time: 04/15/15 11:32 AM  Result Value Ref Range Status  Urine Culture, Routine Preliminary report (A)  Preliminary   Urine Culture result 1 Comment (A)  Preliminary    Comment: Staphylococcus species 50,000-100,000 colony forming units per mL   Microscopic Examination     Status: Abnormal   Collection Time: 04/15/15  2:22 PM  Result Value Ref Range Status   WBC, UA >30 (H) 0 -  5 /hpf Final   RBC, UA >30 (H) 0 -  2 /hpf Final   Epithelial Cells (non renal) 0-10 0 - 10 /hpf Final   Bacteria, UA Many (A) None seen/Few Final     Studies: Dg Chest Port 1 View  04/16/2015  CLINICAL DATA:  Hyponatremia.  Short of  breath EXAM: PORTABLE CHEST 1 VIEW COMPARISON:  03/26/2015 FINDINGS: The heart size and mediastinal contours are within normal limits. Both lungs are clear. The visualized skeletal structures are unremarkable. IMPRESSION: No active disease. Electronically Signed   By: Marlan Palau M.D.   On: 04/16/2015 20:05    Scheduled Meds: . ciprofloxacin  200 mg Intravenous Q12H  . feeding supplement (ENSURE ENLIVE)  237 mL Oral Daily  . gabapentin  100 mg Oral TID  . lactulose  20 g Oral BID  . nadolol  20 mg Oral Daily  . pantoprazole  40 mg Oral Daily  . sodium chloride flush  3 mL Intravenous Q12H   Continuous Infusions: . dextrose 5 % 1,000 mL with sodium bicarbonate 150 mEq infusion 75 mL/hr at 04/17/15 1717    Principal Problem:   Hyponatremia Active Problems:   Hepatic cirrhosis (HCC)   GERD (gastroesophageal reflux disease)   Peripheral neuropathy (HCC)   Thrombocytopenia (HCC)   Weakness   CKD (chronic kidney disease), stage III   Hypokalemia   Elevated troponin    Time spent: 25 minutes    Erick Blinks, MD. [facility] Pager [phone]. If 7PM-7AM, please contact night-coverage at [email or web address], password [facility] 04/18/2015, 7:49 AM  LOS: 2 days

## 2015-04-19 LAB — BASIC METABOLIC PANEL
Anion gap: 4 — ABNORMAL LOW (ref 5–15)
BUN: 36 mg/dL — AB (ref 6–20)
CHLORIDE: 101 mmol/L (ref 101–111)
CO2: 20 mmol/L — ABNORMAL LOW (ref 22–32)
CREATININE: 1.93 mg/dL — AB (ref 0.44–1.00)
Calcium: 8 mg/dL — ABNORMAL LOW (ref 8.9–10.3)
GFR, EST AFRICAN AMERICAN: 32 mL/min — AB (ref >60)
GFR, EST NON AFRICAN AMERICAN: 27 mL/min — AB (ref >60)
Glucose, Bld: 119 mg/dL — ABNORMAL HIGH (ref 65–99)
POTASSIUM: 3.5 mmol/L (ref 3.5–5.1)
SODIUM: 125 mmol/L — AB (ref 135–145)

## 2015-04-19 LAB — CBC
HEMATOCRIT: 29 % — AB (ref 36.0–46.0)
HEMOGLOBIN: 10.4 g/dL — AB (ref 12.0–15.0)
MCH: 30.7 pg (ref 26.0–34.0)
MCHC: 35.9 g/dL (ref 30.0–36.0)
MCV: 85.5 fL (ref 78.0–100.0)
Platelets: 81 10*3/uL — ABNORMAL LOW (ref 150–400)
RBC: 3.39 MIL/uL — AB (ref 3.87–5.11)
RDW: 14.8 % (ref 11.5–15.5)
WBC: 8.3 10*3/uL (ref 4.0–10.5)

## 2015-04-19 LAB — URINE CULTURE: Culture: >=100000

## 2015-04-19 MED ORDER — SODIUM CHLORIDE 0.9 % IV SOLN
INTRAVENOUS | Status: DC
  Administered 2015-04-19 – 2015-04-21 (×2): via INTRAVENOUS

## 2015-04-19 NOTE — Progress Notes (Signed)
[facility] PROGRESS NOTE  Kathryn [name] VHQ:[id number] DOB: [birth year] DOA: 04/16/2015 PCP: [name], FNP  Assessment/Plan: 1. Hyponatremia, nephrology following. Felt to be hypervolemic hyponatremia. She is on IV Lasix. Urine sodium <10. TSH 0.728. Continue to follow.  2. CKD stage 3. Creatinine trending up despite low dose lasix. Will continue Lasix and monitor. Nephrology is following.  Renal US has been ordered.  3. Crohn'disease with ileostomy. She denies abdominal pain. Continue ostomy care 4. Hep C cirrhosis. LFts are stable. Follow up with gastroenterology 5. MRSA UTI. UC reveals MRSA with resistance to Cipro. MRSA in urine is likely a contaminant. She is afebrile and does not have a leukocytosis or urinary symptoms. Will d/c abs and observe.  6. Hypokalemia. Replaced. 7. Elevated troponin. Suspect demand ischemia. EKG is non acute and she is not having any chest pain. Troponin trending down. Continue on telemetry 8. Peripheral neuropathy. Continue gabapentin. 9. Thrombocytopenia. Related to cirrhosis. Continue to follow. 10. GERD. Continue PPI  Code Status: Full DVT prophylaxis: SCDs Family Communication: Discussed with family at bedside. Disposition Plan: Anticipate discharge within 24 hours.  Consultants:  Nephrology  Procedures:  none  Antibiotics:  Cipro 3/10 >>3/12   HPI/Subjective: Feeling a little better but is still weak. Still has some SOB but denies any pain.   Objective: Filed Vitals:   04/18/15 2101 04/19/15 0532  BP: 122/62 98/47  Pulse: 69 67  Temp: 97.4 F (36.3 C) 98.4 F (36.9 C)  Resp: 18 18    Intake/Output Summary (Last 24 hours) at 04/19/15 0756 Last data filed at 04/19/15 0012  Gross per 24 hour  Intake      0 ml  Output    200 ml  Net   -200 ml   Filed Weights   04/16/15 1019 04/16/15 1509  Weight: 90.719 kg (200 lb) 89.223 kg (196 lb 11.2 oz)    Exam:  General: NAD, looks comfortable Cardiovascular: RRR, S1,  S2  Respiratory: clear bilaterally, No wheezing, rales or rhonchi Abdomen: soft, non tender, no distention , RLQ ileostomy in place, bowel sounds normal Musculoskeletal: No edema b/l  Data Reviewed: Basic Metabolic Panel:  Recent Labs Lab 04/15/15 1132 04/16/15 1035 04/17/15 0625 04/18/15 0656 04/19/15 0614  NA 113* 115* 127* 126* 125*  K 3.4* 2.8* 4.2 3.7 3.5  CL 82* 93* 105 104 101  CO2 15* 18* 17* 18* 20*  GLUCOSE 103* 125* 102* 165* 119*  BUN 37* 43* 37* 33* 36*  CREATININE 1.49* 1.49* 1.54* 1.69* 1.93*  CALCIUM 9.0 8.7* 8.7* 8.3* 8.0*  MG  --  1.8  --   --   --    Liver Function Tests:  Recent Labs Lab 04/15/15 1132 04/16/15 1035  AST 39 38  ALT 25 24  ALKPHOS 189* 164*  BILITOT 2.2* 1.9*  PROT 5.8* 5.8*  ALBUMIN 3.3* 2.9*       Recent Labs Lab 04/16/15 1035  AMMONIA 24   CBC:  Recent Labs Lab 04/15/15 1132 04/16/15 1035 04/17/15 0625 04/19/15 0614  WBC 11.6* 10.0 9.6 8.3  NEUTROABS 7.6* 7.3  --   --   HGB  --  12.2 11.3* 10.4*  HCT 37.2 32.9* 31.9* 29.0*  MCV 86 81.8 83.3 85.5  PLT 137* 104* 112* 81*   Cardiac Enzymes:  Recent Labs Lab 04/16/15 1035 04/16/15 1947 04/17/15 0110 04/17/15 0625  TROPONINI 0.10* 0.07* 0.06* 0.07*   BNP (last 3 results)  Recent Labs  03/26/15 0345  BNP 74.0  Recent Results (from the past 240 hour(s))  Urine culture     Status: Abnormal   Collection Time: 04/15/15 11:32 AM  Result Value Ref Range Status   Urine Culture, Routine Final report (A)  Final   Urine Culture result 1 Staphylococcus aureus (A)  Final    Comment: 50,000-100,000 colony forming units per mL Methicillin resistant (MRSA) Based on resistance to oxacillin this isolate would be resistant to all currently available beta-lactam antimicrobial agents, with the exception of the newer cephalosporins with anti-MRSA activity, such as Ceftaroline    RESULT 2 Comment  Final    Comment: Mixed urogenital flora 10,000-25,000 colony  forming units per mL    ANTIMICROBIAL SUSCEPTIBILITY Comment  Final    Comment:       ** S = Susceptible; I = Intermediate; R = Resistant **                    P = Positive; N = Negative             MICS are expressed in micrograms per mL    Antibiotic                 RSLT#1    RSLT#2    RSLT#3    RSLT#4 Ciprofloxacin                  R Gentamicin                     S Levofloxacin                   R Linezolid                      S Nitrofurantoin                 S Oxacillin                      R Penicillin                     R Rifampin                       R Tetracycline                   S Trimethoprim/Sulfa             S Vancomycin                     S   Microscopic Examination     Status: Abnormal   Collection Time: 04/15/15  2:22 PM  Result Value Ref Range Status   WBC, UA >30 (H) 0 -  5 /hpf Final   RBC, UA >30 (H) 0 -  2 /hpf Final   Epithelial Cells (non renal) 0-10 0 - 10 /hpf Final   Bacteria, UA Many (A) None seen/Few Final  Urine culture     Status: None (Preliminary result)   Collection Time: 04/16/15 11:45 PM  Result Value Ref Range Status   Specimen Description URINE, CLEAN CATCH  Final   Special Requests NONE  Final   Culture   Final    CULTURE REINCUBATED FOR BETTER GROWTH Performed at [facility]    Report Status PENDING  Incomplete     Studies: No results found.  Scheduled Meds: . ciprofloxacin  200 mg  Intravenous Q12H  . feeding supplement (ENSURE ENLIVE)  237 mL Oral Daily  . furosemide  40 mg Intravenous Daily  . gabapentin  100 mg Oral TID  . lactulose  20 g Oral BID  . nadolol  20 mg Oral Daily  . pantoprazole  40 mg Oral Daily  . sodium chloride flush  3 mL Intravenous Q12H   Continuous Infusions: . dextrose 5 % 1,000 mL with sodium bicarbonate 150 mEq infusion 100 mL/hr at 04/18/15 1610    Principal Problem:   Hyponatremia Active Problems:   Hepatic cirrhosis (HCC)   GERD (gastroesophageal reflux disease)   Peripheral  neuropathy (HCC)   Thrombocytopenia (HCC)   Weakness   CKD (chronic kidney disease), stage III   Hypokalemia   Elevated troponin    Time spent: 25 minutes    Erick Blinks, MD. [facility] Pager [phone]. If 7PM-7AM, please contact night-coverage at [email or web address], password [facility] 04/19/2015, 7:56 AM  LOS: 3 days     By signing my name below, I, Burnett Harry, attest that this documentation has been prepared under the direction and in the presence of [facility]. MD Electronically Signed: Burnett Harry, Scribe. 04/19/2015   I, Dr. Erick Blinks, personally performed the services described in this documentaiton. All medical record entries made by the scribe were at my direction and in my presence. I have reviewed the chart and agree that the record reflects my personal performance and is accurate and complete  Erick Blinks, MD, 04/19/2015 3:01 PM

## 2015-04-19 NOTE — Consult Note (Signed)
Reason for Consult: Hyponatremia and renal failure Referring Physician: Dr. Dorris Singh is an 60 y.o. female.  HPI: She is a patient who has history of liver cirrhosis, hepatitis C infection, history of Crohn's disease, presently came with complaints of weakness, fatigue. She went to her primary care physician and blood work was done which showed severe hyponatremia hence sent to the hospital. Presently patient states that she is feeling much better. She doesn't have any nausea or vomiting. Her weakness also has improved. Patient also has this moment denies any difficulty in breathing.  Past Medical History  Diagnosis Date  . Hypertension   . Enteritis presumed infectious   . Crohn's   . Anemia   . Hepatitis C antibody test positive   . Hepatitis   . Renal insufficiency   . Cirrhosis ([place])   . Splenomegaly   . Bilateral leg edema 01/2014  . Chronic back pain   . Thrombocytopenia [facility])     Past Surgical History  Procedure Laterality Date  . Colon surgery      MULTIPLE SURGERIES FOR CROHNS  . Ileostomy      multiple abdominal surgeries for Crohn's by Dr. Anthony Sar  . Cholecystectomy    . Abscess drainage      abdominal  . Esophagogastroduodenoscopy  10/05/2010    Procedure: ESOPHAGOGASTRODUODENOSCOPY (EGD);  Surgeon: Rogene Houston, MD;  Location[facility];  Service: Endoscopy;  Laterality: N/A;  7:30 am  . Cirrhosis    . Esophagogastroduodenoscopy N/A 09/11/2013    Procedure: ESOPHAGOGASTRODUODENOSCOPY (EGD);  Surgeon: Rogene Houston, MD;  Location: [facility];  Service: Endoscopy;  Laterality: N/A;  . Ileoscopy  09/11/2013    Procedure: ILEOSCOPY THROUGH STOMA;  Surgeon: Rogene Houston, MD;  Location: [facility];  Service: Endoscopy;;  . Givens capsule study N/A 09/12/2013    Procedure: GIVENS CAPSULE STUDY;  Surgeon: Rogene Houston, MD;  [facility];  Service: Endoscopy;  Laterality: N/A;  . Givens capsule study N/A 11/25/2013    Procedure: GIVENS  CAPSULE STUDY;  Surgeon: Rogene Houston, MD;  Location: [facility];  Service: Endoscopy;  Laterality: N/A;  . Varices cauterization Right     in the stoma of her ileostomy  . Tips procedure  11/2013    [facility]     Family History  Problem Relation Age of Onset  . Cancer Mother   . Stroke Father     Social History:  reports that she has never smoked. She has never used smokeless tobacco. She reports that she does not drink alcohol or use illicit drugs.  Allergies:  Allergies  Allergen Reactions  . Penicillins Rash    Has patient had a PCN reaction causing immediate rash, facial/tongue/throat swelling, SOB or lightheadedness with hypotension: Yes Has patient had a PCN reaction causing severe rash involving mucus membranes or skin necrosis: No Has patient had a PCN reaction that required hospitalization No Has patient had a PCN reaction occurring within the last 10 years: No If all of the above answers are "NO", then may proceed with Cephalosporin use.     Medications: I have reviewed the patient's current medications.  Results for orders placed or performed during the hospital encounter of 04/16/15 (from the past 48 hour(s))  Basic metabolic panel     Status: Abnormal   Collection Time: 04/18/15  6:56 AM  Result Value Ref Range   Sodium 126 (L) 135 - 145 mmol/L   Potassium 3.7  3.5 - 5.1 mmol/L   Chloride 104 101 - 111 mmol/L   CO2 18 (L) 22 - 32 mmol/L   Glucose, Bld 165 (H) 65 - 99 mg/dL   BUN 33 (H) 6 - 20 mg/dL   Creatinine, Ser 1.69 (H) 0.44 - 1.00 mg/dL   Calcium 8.3 (L) 8.9 - 10.3 mg/dL   GFR calc non Af Amer 32 (L) >60 mL/min   GFR calc Af Amer 37 (L) >60 mL/min    Comment: (NOTE) The eGFR has been calculated using the CKD EPI equation. This calculation has not been validated in all clinical situations. eGFR's persistently <60 mL/min signify possible Chronic Kidney Disease.    Anion gap 4 (L) 5 - 15  Basic metabolic panel     Status: Abnormal   Collection Time:  04/19/15  6:14 AM  Result Value Ref Range   Sodium 125 (L) 135 - 145 mmol/L   Potassium 3.5 3.5 - 5.1 mmol/L   Chloride 101 101 - 111 mmol/L   CO2 20 (L) 22 - 32 mmol/L   Glucose, Bld 119 (H) 65 - 99 mg/dL   BUN 36 (H) 6 - 20 mg/dL   Creatinine, Ser 1.93 (H) 0.44 - 1.00 mg/dL   Calcium 8.0 (L) 8.9 - 10.3 mg/dL   GFR calc non Af Amer 27 (L) >60 mL/min   GFR calc Af Amer 32 (L) >60 mL/min    Comment: (NOTE) The eGFR has been calculated using the CKD EPI equation. This calculation has not been validated in all clinical situations. eGFR's persistently <60 mL/min signify possible Chronic Kidney Disease.    Anion gap 4 (L) 5 - 15  CBC     Status: Abnormal   Collection Time: 04/19/15  6:14 AM  Result Value Ref Range   WBC 8.3 4.0 - 10.5 K/uL   RBC 3.39 (L) 3.87 - 5.11 MIL/uL   Hemoglobin 10.4 (L) 12.0 - 15.0 g/dL   HCT 29.0 (L) 36.0 - 46.0 %   MCV 85.5 78.0 - 100.0 fL   MCH 30.7 26.0 - 34.0 pg   MCHC 35.9 30.0 - 36.0 g/dL   RDW 14.8 11.5 - 15.5 %   Platelets 81 (L) 150 - 400 K/uL    Comment: SPECIMEN CHECKED FOR CLOTS PLATELET COUNT CONFIRMED BY SMEAR     No results found.  Review of Systems  Constitutional: Positive for malaise/fatigue.  Respiratory: Negative for shortness of breath.   Cardiovascular: Positive for leg swelling. Negative for orthopnea.  Gastrointestinal: Negative for nausea, abdominal pain and diarrhea.  Genitourinary: Positive for dysuria and urgency.  Neurological: Positive for dizziness and weakness.   Blood pressure 106/57, pulse 67, temperature 98.4 F (36.9 C), temperature source Oral, resp. rate 18, height 5' 5"  (1.651 m), weight 196 lb 11.2 oz (89.223 kg), SpO2 99 %. Physical Exam  Constitutional: She is oriented to person, place, and time. No distress.  Eyes: Left eye exhibits no discharge.  Cardiovascular: Normal rate and regular rhythm.   Respiratory: No respiratory distress. She has no wheezes.  GI: She exhibits no distension. There is no  tenderness.  Musculoskeletal: She exhibits edema.  Neurological: She is alert and oriented to person, place, and time.    Assessment/Plan: Problem #1 hyponatremia: Hypervolemic hyponatremia. Patient with serum osmolality 272 which is to hyponatremia. Her urine osmolality is 309. Presently patient on Lasix and normal saline her sodium has improved. Today's 125. Problem #2 hypokalemia: Her potassium has corrected Problem #3 renal failure: Presently seems to  be chronic. Patient with creatinine of 1.3 on 12/01/2013 with the EGFR of 41 mL/m. Hence patient has stage III. The present increasing BUN and creatinine could be just from fluid removal however prerenal syndrome and ATN cannot be ruled out. The etiology for her renal failure could be multifactorial including from recurrent acute kidney injury/hypertension/interstitial nephritis. Problem #4 history of Crohn's disease Problem #5 history of liver cirrhosis: Possibly due to hepatitis C infection. Patient is being followed by Dr Laural Golden Problem #7 hypertension: Her blood pressure seems to be reasonably controlled Problem #8 anemia Plan: We'll check ultrasound of her kidneys 2] we'll check ANA, complement. 3] we'll check 24-hour urine for protein and immunoelectrophoresis 4] will continue with Lasix 5] we'll restrict free water intake. 6] we'll check renal panel in the morning.  Joannie Medine S 04/19/2015, 12:48 PM

## 2015-04-20 ENCOUNTER — Telehealth (INDEPENDENT_AMBULATORY_CARE_PROVIDER_SITE_OTHER): Payer: Self-pay | Admitting: Internal Medicine

## 2015-04-20 ENCOUNTER — Other Ambulatory Visit (HOSPITAL_COMMUNITY): Payer: Medicare HMO

## 2015-04-20 ENCOUNTER — Inpatient Hospital Stay (HOSPITAL_COMMUNITY): Payer: Medicare HMO

## 2015-04-20 LAB — RENAL FUNCTION PANEL
Albumin: 2.4 g/dL — ABNORMAL LOW (ref 3.5–5.0)
Anion gap: 4 — ABNORMAL LOW (ref 5–15)
BUN: 38 mg/dL — AB (ref 6–20)
CALCIUM: 8 mg/dL — AB (ref 8.9–10.3)
CHLORIDE: 102 mmol/L (ref 101–111)
CO2: 18 mmol/L — AB (ref 22–32)
CREATININE: 1.95 mg/dL — AB (ref 0.44–1.00)
GFR calc non Af Amer: 27 mL/min — ABNORMAL LOW (ref >60)
GFR, EST AFRICAN AMERICAN: 31 mL/min — AB (ref >60)
GLUCOSE: 96 mg/dL (ref 65–99)
Phosphorus: 4 mg/dL (ref 2.5–4.6)
Potassium: 3.3 mmol/L — ABNORMAL LOW (ref 3.5–5.1)
SODIUM: 124 mmol/L — AB (ref 135–145)

## 2015-04-20 LAB — COMPLEMENT, TOTAL: COMPL TOTAL (CH50): 16 U/mL — AB (ref 42–60)

## 2015-04-20 LAB — ANTISTREPTOLYSIN O TITER: ASO: 96 [IU]/mL (ref 0.0–200.0)

## 2015-04-20 LAB — C4 COMPLEMENT: Complement C4, Body Fluid: 9 mg/dL — ABNORMAL LOW (ref 14–44)

## 2015-04-20 LAB — C3 COMPLEMENT: C3 COMPLEMENT: 44 mg/dL — AB (ref 82–167)

## 2015-04-20 MED ORDER — POTASSIUM CHLORIDE CRYS ER 20 MEQ PO TBCR
40.0000 meq | EXTENDED_RELEASE_TABLET | Freq: Once | ORAL | Status: AC
  Administered 2015-04-20: 40 meq via ORAL
  Filled 2015-04-20: qty 2

## 2015-04-20 NOTE — Progress Notes (Signed)
[facility] PROGRESS NOTE  Kathryn [name] WUJ:[id number] DOB: 1955-09-21 DOA: 04/16/2015 PCP: [name], FNP  Assessment/Plan: 1. Hyponatremia, nephrology following. Felt to be hypervolemic hyponatremia. She is on IV Lasix. Urine sodium <10. TSH 0.728. Continue to follow.  2. CKD stage 3. Creatinine trending up despite low dose lasix. Will continue Lasix and monitor. Renal US unremarkable.  Nephrology following.  3. Crohn's disease with ileostomy. She denies abdominal pain. Continue ostomy care 4. Hep C cirrhosis. LFTs are stable. Follow up with gastroenterology 5. MRSA UTI. UC reveals MRSA with resistance to Cipro. MRSA in urine is likely a contaminant. She is afebrile and does not have a leukocytosis or urinary symptoms.  6. Hypokalemia. Will replace.  7. Elevated troponin. Suspect demand ischemia. EKG is non acute and she is not having any chest pain. Troponin trending down. Continue on telemetry 8. Peripheral neuropathy. Continue gabapentin. 9. Thrombocytopenia. Related to cirrhosis. Continue to follow. 10. GERD. Continue PPI  Code Status: Full DVT prophylaxis: SCDs Family Communication: Discussed with family at bedside. Disposition Plan: Anticipate discharge within 1-2 days.  Consultants:  Nephrology  Procedures:  none  Antibiotics:  Cipro 3/10 >>3/12   HPI/Subjective: Does not feel well today. Reports feeling tired, weak and mild nausea. Did have a bowel movement today.   Objective: Filed Vitals:   04/19/15 2106 04/20/15 0539  BP: 112/53 104/47  Pulse: 70 70  Temp: 98.2 F (36.8 C) 98.2 F (36.8 C)  Resp: 20 20    Intake/Output Summary (Last 24 hours) at 04/20/15 0904 Last data filed at 04/20/15 0500  Gross per 24 hour  Intake 2108.75 ml  Output      0 ml  Net 2108.75 ml   Filed Weights   04/16/15 1019 04/16/15 1509  Weight: 90.719 kg (200 lb) 89.223 kg (196 lb 11.2 oz)    Exam:  General: NAD, looks comfortable Cardiovascular: RRR, S1, S2   Respiratory: clear bilaterally, No wheezing, rales or rhonchi Abdomen: soft, non tender, no distention , RLQ ileostomy in place, bowel sounds normal Musculoskeletal: Trace edema b/l  Data Reviewed: Basic Metabolic Panel:  Recent Labs Lab 04/15/15 1132 04/16/15 1035 04/17/15 0625 04/18/15 0656 04/19/15 0614  NA 113* 115* 127* 126* 125*  K 3.4* 2.8* 4.2 3.7 3.5  CL 82* 93* 105 104 101  CO2 15* 18* 17* 18* 20*  GLUCOSE 103* 125* 102* 165* 119*  BUN 37* 43* 37* 33* 36*  CREATININE 1.49* 1.49* 1.54* 1.69* 1.93*  CALCIUM 9.0 8.7* 8.7* 8.3* 8.0*  MG  --  1.8  --   --   --    Liver Function Tests:  Recent Labs Lab 04/15/15 1132 04/16/15 1035  AST 39 38  ALT 25 24  ALKPHOS 189* 164*  BILITOT 2.2* 1.9*  PROT 5.8* 5.8*  ALBUMIN 3.3* 2.9*       Recent Labs Lab 04/16/15 1035  AMMONIA 24   CBC:  Recent Labs Lab 04/15/15 1132 04/16/15 1035 04/17/15 0625 04/19/15 0614  WBC 11.6* 10.0 9.6 8.3  NEUTROABS 7.6* 7.3  --   --   HGB  --  12.2 11.3* 10.4*  HCT 37.2 32.9* 31.9* 29.0*  MCV 86 81.8 83.3 85.5  PLT 137* 104* 112* 81*   Cardiac Enzymes:  Recent Labs Lab 04/16/15 1035 04/16/15 1947 04/17/15 0110 04/17/15 0625  TROPONINI 0.10* 0.07* 0.06* 0.07*   BNP (last 3 results)  Recent Labs  03/26/15 0345  BNP 74.0       Recent Results (from  the past 240 hour(s))  Urine culture     Status: Abnormal   Collection Time: 04/15/15 11:32 AM  Result Value Ref Range Status   Urine Culture, Routine Final report (A)  Final   Urine Culture result 1 Staphylococcus aureus (A)  Final    Comment: 50,000-100,000 colony forming units per mL Methicillin resistant (MRSA) Based on resistance to oxacillin this isolate would be resistant to all currently available beta-lactam antimicrobial agents, with the exception of the newer cephalosporins with anti-MRSA activity, such as Ceftaroline    RESULT 2 Comment  Final    Comment: Mixed urogenital flora 10,000-25,000 colony  forming units per mL    ANTIMICROBIAL SUSCEPTIBILITY Comment  Final    Comment:       ** S = Susceptible; I = Intermediate; R = Resistant **                    P = Positive; N = Negative             MICS are expressed in micrograms per mL    Antibiotic                 RSLT#1    RSLT#2    RSLT#3    RSLT#4 Ciprofloxacin                  R Gentamicin                     S Levofloxacin                   R Linezolid                      S Nitrofurantoin                 S Oxacillin                      R Penicillin                     R Rifampin                       R Tetracycline                   S Trimethoprim/Sulfa             S Vancomycin                     S   Microscopic Examination     Status: Abnormal   Collection Time: 04/15/15  2:22 PM  Result Value Ref Range Status   WBC, UA >30 (H) 0 -  5 /hpf Final   RBC, UA >30 (H) 0 -  2 /hpf Final   Epithelial Cells (non renal) 0-10 0 - 10 /hpf Final   Bacteria, UA Many (A) None seen/Few Final  Urine culture     Status: None   Collection Time: 04/16/15 11:45 PM  Result Value Ref Range Status   Specimen Description URINE, CLEAN CATCH  Final   Special Requests NONE  Final   Culture   Final    >=100,000 COLONIES/mL METHICILLIN RESISTANT STAPHYLOCOCCUS AUREUS Performed at [facility]    Report Status 04/19/2015 FINAL  Final   Organism ID, Bacteria METHICILLIN RESISTANT STAPHYLOCOCCUS AUREUS  Final      Susceptibility  Methicillin resistant staphylococcus aureus - MIC*    CIPROFLOXACIN >=8 RESISTANT Resistant     GENTAMICIN <=0.5 SENSITIVE Sensitive     NITROFURANTOIN <=16 SENSITIVE Sensitive     OXACILLIN >=4 RESISTANT Resistant     TETRACYCLINE <=1 SENSITIVE Sensitive     VANCOMYCIN 1 SENSITIVE Sensitive     TRIMETH/SULFA <=10 SENSITIVE Sensitive     CLINDAMYCIN <=0.25 RESISTANT Resistant     RIFAMPIN >=32 RESISTANT Resistant     Inducible Clindamycin POSITIVE Resistant     * >=100,000 COLONIES/mL METHICILLIN  RESISTANT STAPHYLOCOCCUS AUREUS     Studies: No results found.  Scheduled Meds: . feeding supplement (ENSURE ENLIVE)  237 mL Oral Daily  . furosemide  40 mg Intravenous Daily  . gabapentin  100 mg Oral TID  . lactulose  20 g Oral BID  . nadolol  20 mg Oral Daily  . pantoprazole  40 mg Oral Daily  . sodium chloride flush  3 mL Intravenous Q12H   Continuous Infusions: . sodium chloride 75 mL/hr at 04/19/15 1029    Principal Problem:   Hyponatremia Active Problems:   Hepatic cirrhosis (HCC)   GERD (gastroesophageal reflux disease)   Peripheral neuropathy (HCC)   Thrombocytopenia (H[facility])   Weakness   CKD (chronic kidney disease), stage III   Hypokalemia   Elevated troponin    Time spent: 25 minutes    Erick Blinks, MD. [facility] Pager [phone]. If 7PM-7AM, please contact night-coverage at [email or web address], password [facility] 04/20/2015, 9:04 AM  LOS: 4 days     By signing my name below, I, Zadie Cleverly, attest that this documentation has been prepared under the direction and in the presence of Erick Blinks, MD. Electronically signed: Zadie Cleverly, Scribe. 2:13pm    I, Dr. Erick Blinks, personally performed the services described in this documentaiton. All medical record entries made by the scribe were at my direction and in my presence. I have reviewed the chart and agree that the record reflects my personal performance and is accurate and complete  Erick Blinks, MD, 04/20/2015 9:04 AM  I, Dr. Erick Blinks, personally performed the services described in this documentaiton. All medical record entries made by the scribe were at my direction and in my presence. I have reviewed the chart and agree that the record reflects my personal performance and is accurate and complete  Erick Blinks, MD, 04/20/2015 2:19 PM

## 2015-04-20 NOTE — Progress Notes (Signed)
PT Cancellation Note  Patient Details Name: Kathryn [name] MRN: [id number] DOB: 02-07-1956   Cancelled Treatment:    Reason Eval/Treat Not Completed: Patient declined, no reason specified.  Has general malaise.  Will try again tomorrow.   Myrlene Broker L  PT 04/20/2015, 2:39 PM

## 2015-04-20 NOTE — Telephone Encounter (Signed)
Ms. Kathryn [name] left a message over the weekend saying she was admitted into [facility] due to having low sodium levels. She wanted to make [name] aware of this.  Pt's ph# [phone]  Thank you.

## 2015-04-20 NOTE — Progress Notes (Signed)
Subjective: Patient presently offers no complaints. Patient denies any nausea or vomiting. She denies also any difficulty breathing.   Objective: Vital signs in last 24 hours: Temp:  [98.2 F (36.8 C)-98.7 F (37.1 C)] 98.2 F (36.8 C) (03/13 0539) Pulse Rate:  [67-77] 70 (03/13 0539) Resp:  [18-20] 20 (03/13 0539) BP: (104-112)/(47-60) 104/47 mmHg (03/13 0539) SpO2:  [95 %-100 %] 95 % (03/13 0539)  Intake/Output from previous day: 03/12 0701 - 03/13 0700 In: 2348.8 [P.O.:960; I.V.:1388.8] Out: -  Intake/Output this shift:     Recent Labs  04/19/15 0614  HGB 10.4*    Recent Labs  04/19/15 0614  WBC 8.3  RBC 3.39*  HCT 29.0*  PLT 81*    Recent Labs  04/18/15 0656 04/19/15 0614  NA 126* 125*  K 3.7 3.5  CL 104 101  CO2 18* 20*  BUN 33* 36*  CREATININE 1.69* 1.93*  GLUCOSE 165* 119*  CALCIUM 8.3* 8.0*   No results for input(s): LABPT, INR in the last 72 hours.  Generally patient is alert and in no apparent distress Chest is clear to auscultation Heart exam is regular rate and rhythm no murmur Extremities she has 1+ edema on the right and trace on the left.  Assessment/Plan: Problem #1 hyponatremia: This is secondary to hypervolemic hyponatremia. Presently patient is asymptomatic. Her sodium slightly decline today. Problem #2 renal failure: Chronic and stage III. Presently patient is asymptomatic. Renal function is stable Problem #3 hepatic encephalopathy: Her ammonia level is good and patient is asymptomatic. Problem #4 hypokalemia: Potassium is slightly low most likely from Lasix. Problem #5 history of liver cirrhosis Problem #6 anemia: Her hemoglobin is within our target goal. Problem #6 height per tension: Her blood pressure is reasonably controlled Problem #8 history of hep C infection Plan: 1] We'll check her renal panel in the morning. 2] will change Lasix to 40 mg by mouth daily   Stefanie Hodgens S 04/20/2015, 9:04 AM

## 2015-04-21 LAB — RENAL FUNCTION PANEL
ALBUMIN: 2.3 g/dL — AB (ref 3.5–5.0)
ANION GAP: 5 (ref 5–15)
BUN: 37 mg/dL — AB (ref 6–20)
CO2: 18 mmol/L — ABNORMAL LOW (ref 22–32)
Calcium: 8 mg/dL — ABNORMAL LOW (ref 8.9–10.3)
Chloride: 106 mmol/L (ref 101–111)
Creatinine, Ser: 1.9 mg/dL — ABNORMAL HIGH (ref 0.44–1.00)
GFR, EST AFRICAN AMERICAN: 32 mL/min — AB (ref >60)
GFR, EST NON AFRICAN AMERICAN: 28 mL/min — AB (ref >60)
Glucose, Bld: 128 mg/dL — ABNORMAL HIGH (ref 65–99)
PHOSPHORUS: 3.9 mg/dL (ref 2.5–4.6)
POTASSIUM: 2.8 mmol/L — AB (ref 3.5–5.1)
Sodium: 129 mmol/L — ABNORMAL LOW (ref 135–145)

## 2015-04-21 MED ORDER — MESALAMINE 1000 MG RE SUPP
1000.0000 mg | Freq: Every day | RECTAL | Status: DC
  Administered 2015-04-21: 1000 mg via RECTAL
  Filled 2015-04-21 (×3): qty 1

## 2015-04-21 MED ORDER — MUPIROCIN 2 % EX OINT
1.0000 "application " | TOPICAL_OINTMENT | Freq: Two times a day (BID) | CUTANEOUS | Status: DC
  Administered 2015-04-21 – 2015-04-22 (×4): 1 via NASAL
  Filled 2015-04-21: qty 22

## 2015-04-21 MED ORDER — POTASSIUM CHLORIDE 10 MEQ/100ML IV SOLN
10.0000 meq | INTRAVENOUS | Status: AC
  Administered 2015-04-21 (×3): 10 meq via INTRAVENOUS
  Filled 2015-04-21: qty 100

## 2015-04-21 MED ORDER — POTASSIUM CHLORIDE CRYS ER 10 MEQ PO TBCR
30.0000 meq | EXTENDED_RELEASE_TABLET | Freq: Two times a day (BID) | ORAL | Status: DC
  Administered 2015-04-21 – 2015-04-22 (×3): 30 meq via ORAL
  Filled 2015-04-21 (×3): qty 1

## 2015-04-21 MED ORDER — CHLORHEXIDINE GLUCONATE CLOTH 2 % EX PADS
6.0000 | MEDICATED_PAD | Freq: Every day | CUTANEOUS | Status: DC
  Administered 2015-04-21 – 2015-04-22 (×2): 6 via TOPICAL

## 2015-04-21 NOTE — Progress Notes (Signed)
Subjective: Patient continued to feel better. She denies any nausea or vomiting. Presently show forced noncompliance.  Objective: Vital signs in last 24 hours: Temp:  [97.9 F (36.6 C)-98.4 F (36.9 C)] 98.2 F (36.8 C) (03/14 0603) Pulse Rate:  [72-77] 74 (03/14 0603) Resp:  [15-20] 15 (03/14 0603) BP: (95-108)/(50-53) 108/53 mmHg (03/14 0603) SpO2:  [95 %-96 %] 95 % (03/14 0603)  Intake/Output from previous day: 03/13 0701 - 03/14 0700 In: 2631.3 [P.O.:720; I.V.:1911.3] Out: 200 [Urine:200] Intake/Output this shift:     Recent Labs  04/19/15 0614  HGB 10.4*    Recent Labs  04/19/15 0614  WBC 8.3  RBC 3.39*  HCT 29.0*  PLT 81*    Recent Labs  04/20/15 0740 04/21/15 0521  NA 124* 129*  K 3.3* 2.8*  CL 102 106  CO2 18* 18*  BUN 38* 37*  CREATININE 1.95* 1.90*  GLUCOSE 96 128*  CALCIUM 8.0* 8.0*   No results for input(s): LABPT, INR in the last 72 hours.  Generally patient is alert and in no apparent distress Chest is clear to auscultation Heart exam is regular rate and rhythm no murmur Extremities she has 1+ edema on the right and trace on the left.  Assessment/Plan: Problem #1 hyponatremia: This is secondary to hypervolemic hyponatremia. Her sodium is improving. Today his 129. Problem #2 renal failure: Chronic and stage III. Presently patient is asymptomatic. Her renal function remains stable Problem #3 hepatic encephalopathy: Her ammonia level is good and patient is asymptomatic. Problem #4 hypokalemia: Potassium has declined. Most likely secondary to diuretics. Problem #5 history of liver cirrhosis Problem #6 anemia: Her hemoglobin is within our target goal. Problem #6 height per tension: Her blood pressure is reasonably controlled Problem #8 history of hep C infection Plan: 1] We'll DC IV fluid 2] will give her KCl 10 mEq IV 3 doses 3] will give her KCl 30 mEq by mouth twice a day 4] we'll check her basic metabolic panel in the morning 5]  we'll DC her Lasix.   Herma Uballe S 04/21/2015, 8:24 AM

## 2015-04-21 NOTE — Evaluation (Signed)
Physical Therapy Evaluation Patient Details Name: Kathryn [name] MRN: [id number] DOB: November 16, 1955 Today's Date: 04/21/2015   History of Present Illness  60yo white female comes to [facility] after malaise and fatigue, admitted for hyponatremia. Pt familiar to this PT from prior admissions (2/2 SOB, 2/17 AMS).  PMH: Crohn's disease status post colectomy and ileostomy creation, cirrhosis of the liver, hepatitis C, hypertension, chronic anemia with thrombocytopenia requiring iron transfusions. At baseline is fully indep, but reports a gradual decline in overall strength and functional capacity over the last 6-12 months. Pt previously DC with OPPT recommendations, however, pt has only received an evaluation at this time, with no additional visits for reasons unclear.   Clinical Impression  Pt performing all mobility near baseline, except for transfers and ambulation which demonstrate some mild weakness compared to 3 weeks ago. PT eval continues to demonstrate need for outpatient PT services at DC. No additional needs warranted at this venue. Pt is tolerating indep mobility in room, and limited community distances with modified indep. PT signing off.      Follow Up Recommendations Outpatient PT (Prefers [facility] outpatient PT. )    Equipment Recommendations  None recommended by PT    Recommendations for Other Services       Precautions / Restrictions Precautions Precautions: None Restrictions Weight Bearing Restrictions: No      Mobility  Bed Mobility Overal bed mobility: Independent                Transfers Overall transfer level: Modified independent Equipment used: None             General transfer comment: 5x STS in 22s, can only perform first two without hands; perviously 23.04 hands free on 2/17)   Ambulation/Gait Ambulation/Gait assistance: Modified independent (Device/Increase time) Ambulation Distance (Feet): 250 Feet Assistive device: None   Gait velocity: 1.84ms (was  0.574m on 2/17); Pt reports to feel unsteady compared to baseline, denies SOB.       Stairs            Wheelchair Mobility    Modified Rankin (Stroke Patients Only)       Balance Overall balance assessment: Modified Independent;No apparent balance deficits (not formally assessed)                                           Pertinent Vitals/Pain Pain Assessment: No/denies pain    Home Living Family/patient expects to be discharged to:: Private residence Living Arrangements: Spouse/significant other Available Help at Discharge: Family [place] of Home[place] House Home Access: Stairs to enter Entrance Stairs-Rails: RiPsychiatric nursef Steps: 2 Home Layout: One level Home Equipment: None      Prior Function Level of Independence: Independent         Comments: Limited actvity tolerance, easily fatigued with prolonged walking; reports she requires a buggy when making groceries due to gradual onset fatgiue.      Hand Dominance        Extremity/Trunk Assessment   Upper Extremity Assessment: Overall WFL for tasks assessed           Lower Extremity Assessment: Generalized weakness;Overall [place] tasks assessed      Cervical / Trunk Assessment: Kyphotic  Communication   Communication: No difficulties  Cognition Arousal/Alertness: Awake/alert Behavior During Therapy: WFL for tasks assessed/performed Overall Cognitive Status: Within Functional Limits  for tasks assessed                      General Comments      Exercises        Assessment/Plan    PT Assessment All further PT needs can be met in the next venue of care  PT Diagnosis Generalized weakness;Difficulty walking;Abnormality of gait   PT Problem List Decreased strength;Decreased activity tolerance;Decreased balance;Obesity;Decreased mobility;Cardiopulmonary status limiting activity  PT Treatment Interventions      PT Goals (Current goals can be found in the Care Plan section) Acute Rehab PT Goals Patient Stated Goal: Regain functional capacity and general strength with work related activities and IADL.  PT Goal Formulation: All assessment and education complete, DC therapy    Frequency     Barriers to discharge        Co-evaluation               End of Session Equipment Utilized During Treatment: Gait belt Activity Tolerance: Patient tolerated treatment well;No increased pain Patient left: in bed;with call bell/phone within reach;with nursing/sitter in room           Time: 0920-0933 PT Time Calculation (min) (ACUTE ONLY): 13 min   Charges:   PT Evaluation $PT Eval Low Complexity: 1 Procedure PT Treatments $Therapeutic Activity: 8-22 mins   PT G Codes:        9:47 AM, May 01, 2015 Etta Grandchild, PT, DPT PRN Physical Therapist at [place] License # [id number] [phone] (wireless)  [phone] (mobile)

## 2015-04-21 NOTE — Progress Notes (Signed)
[facility] PROGRESS NOTE  Kathryn [name]:[id number] DOB: February 23, 1955 DOA: 04/16/2015 PCP: [name], FNP   Summary: 68 yof with a hx of HepC cirrhosis s/p treatment with harvoni, Crohn's disease s/p bowel resection with ileostomy, presented to her PCP office with complaints of fatigue was found to have hyponatremia and was subsequently brought to the hospital. She reported adequate PO intake and denied any N/V, or increased output from ostomy. Her hyponatremia is beginning to improve with IV lasix. Nephrology is currently following. We are still adjusting/replacing her electrolytes. Her potassium has been replaced and will need to be checked in the am. Anticipate discharge home tomorrow if BMET is improved.  Assessment/Plan: 1. Hypervolemic hyponatremia, nephrology input appreciated. Improving with IV Lasix. Urine sodium <10. TSH 0.728. Continue to follow. Nephrology following. 2. CKD stage 3. Presently asymptomatic. Creatinine is stable on low dose lasix. Will continue Lasix and monitor. Renal US unremarkable.  Nephrology following.  3. Crohn's disease with ileostomy. She denies abdominal pain. Continue ostomy care. She does complain of discharge from her anus, discussed with Dr. 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 He recommended mesalamine suppositories once daily for the next month. Will follow her up in the office.  4. Hep C cirrhosis. LFTs are stable. Follow up with gastroenterology 5. MRSA UTI. UC reveals MRSA with resistance to Cipro. Unlikely to be a true infection. She is afebrile and does not have a leukocytosis or urinary symptoms.  6. Hypokalemia. Potassium was replaced, but remains low at 2.8, likely related to lasix. Follow up in the am. 7. Elevated troponin. Suspect demand ischemia. EKG is non acute and she is not having any chest pain. Troponin trending down. Continue to monitor on telemetry 8. Peripheral neuropathy. Continue  gabapentin. 9. Thrombocytopenia. Related to cirrhosis. Continue to follow. 10. GERD. Continue PPI  Code Status: Full DVT prophylaxis: SCDs Family Communication: Discussed with family at bedside. Disposition Plan: Anticipate discharge within 24 hours.  Consultants:  Nephrology  Procedures:  none  Antibiotics:  Cipro 3/10 >>3/12   HPI/Subjective: Feels better today. Has not been coughing. Is urinating well. Breathing has improved and is not vomiting. Reports that discharge from anal area that has recently become smelly.  Objective: Filed Vitals:   04/20/15 2156 04/21/15 0603  BP: 95/50 108/53  Pulse: 77 74  Temp: 97.9 F (36.6 C) 98.2 F (36.8 C)  Resp: 20 15    Intake/Output Summary (Last 24 hours) at 04/21/15 0728 Last data filed at 04/21/15 8119  Gross per 24 hour  Intake 2631.25 ml  Output    200 ml  Net 2431.25 ml   Filed Weights   04/16/15 1019 04/16/15 1509  Weight: 90.719 kg (200 lb) 89.223 kg (196 lb 11.2 oz)    Exam:  General: NAD, looks comfortable Cardiovascular: RRR, S1, S2  Respiratory: clear bilaterally, No wheezing, rales or rhonchi  Abdomen: soft, non tender, no distention , RLQ ileostomy in place, bowel sounds normal Musculoskeletal: Trace edema b/l  Data Reviewed: Basic Metabolic Panel:  Recent Labs Lab 04/16/15 1035 04/17/15 0625 04/18/15 0656 04/19/15 0614 04/20/15 0740 04/21/15 0521  NA 115* 127* 126* 125* 124* 129*  K 2.8* 4.2 3.7 3.5 3.3* 2.8*  CL 93* 105 104 101 102 106  CO2 18* 17* 18* 20* 18* 18*  GLUCOSE 125* 102* 165* 119* 96 128*  BUN 43* 37* 33* 36* 38* 37*  CREATININE 1.49* 1.54* 1.69* 1.93* 1.95* 1.90*  CALCIUM 8.7* 8.7* 8.3* 8.0* 8.0*  8.0*  MG 1.8  --   --   --   --   --   PHOS  --   --   --   --  4.0 3.9   Liver Function Tests:  Recent Labs Lab 04/15/15 1132 04/16/15 1035 04/20/15 0740 04/21/15 0521  AST 39 38  --   --   ALT 25 24  --   --   ALKPHOS 189* 164*  --   --   BILITOT 2.2* 1.9*  --    --   PROT 5.8* 5.8*  --   --   ALBUMIN 3.3* 2.9* 2.4* 2.3*       Recent Labs Lab 04/16/15 1035  AMMONIA 24   CBC:  Recent Labs Lab 04/15/15 1132 04/16/15 1035 04/17/15 0625 04/19/15 0614  WBC 11.6* 10.0 9.6 8.3  NEUTROABS 7.6* 7.3  --   --   HGB  --  12.2 11.3* 10.4*  HCT 37.2 32.9* 31.9* 29.0*  MCV 86 81.8 83.3 85.5  PLT 137* 104* 112* 81*   Cardiac Enzymes:  Recent Labs Lab 04/16/15 1035 04/16/15 1947 04/17/15 0110 04/17/15 0625  TROPONINI 0.10* 0.07* 0.06* 0.07*   BNP (last 3 results)  Recent Labs  03/26/15 0345  BNP 74.0       Recent Results (from the past 240 hour(s))  Urine culture     Status: Abnormal   Collection Time: 04/15/15 11:32 AM  Result Value Ref Range Status   Urine Culture, Routine Final report (A)  Final   Urine Culture result 1 Staphylococcus aureus (A)  Final    Comment: 50,000-100,000 colony forming units per mL Methicillin resistant (MRSA) Based on resistance to oxacillin this isolate would be resistant to all currently available beta-lactam antimicrobial agents, with the exception of the newer cephalosporins with anti-MRSA activity, such as Ceftaroline    RESULT 2 Comment  Final    Comment: Mixed urogenital flora 10,000-25,000 colony forming units per mL    ANTIMICROBIAL SUSCEPTIBILITY Comment  Final    Comment:       ** S = Susceptible; I = Intermediate; R = Resistant **                    P = Positive; N = Negative             MICS are expressed in micrograms per mL    Antibiotic                 RSLT#1    RSLT#2    RSLT#3    RSLT#4 Ciprofloxacin                  R Gentamicin                     S Levofloxacin                   R Linezolid                      S Nitrofurantoin                 S Oxacillin                      R Penicillin                     R Rifampin  R Tetracycline                   S Trimethoprim/Sulfa             S Vancomycin                     S   Microscopic Examination      Status: Abnormal   Collection Time: 04/15/15  2:22 PM  Result Value Ref Range Status   WBC, UA >30 (H) 0 -  5 /hpf Final   RBC, UA >30 (H) 0 -  2 /hpf Final   Epithelial Cells (non renal) 0-10 0 - 10 /hpf Final   Bacteria, UA Many (A) None seen/Few Final  Urine culture     Status: None   Collection Time: 04/16/15 11:45 PM  Result Value Ref Range Status   Specimen Description URINE, CLEAN CATCH  Final   Special Requests NONE  Final   Culture   Final    >=100,000 COLONIES/mL METHICILLIN RESISTANT STAPHYLOCOCCUS AUREUS Performed at [facility]    Report Status 04/19/2015 FINAL  Final   Organism ID, Bacteria METHICILLIN RESISTANT STAPHYLOCOCCUS AUREUS  Final      Susceptibility   Methicillin resistant staphylococcus aureus - MIC*    CIPROFLOXACIN >=8 RESISTANT Resistant     GENTAMICIN <=0.5 SENSITIVE Sensitive     NITROFURANTOIN <=16 SENSITIVE Sensitive     OXACILLIN >=4 RESISTANT Resistant     TETRACYCLINE <=1 SENSITIVE Sensitive     VANCOMYCIN 1 SENSITIVE Sensitive     TRIMETH/SULFA <=10 SENSITIVE Sensitive     CLINDAMYCIN <=0.25 RESISTANT Resistant     RIFAMPIN >=32 RESISTANT Resistant     Inducible Clindamycin POSITIVE Resistant     * >=100,000 COLONIES/mL METHICILLIN RESISTANT STAPHYLOCOCCUS AUREUS     Studies: US Renal  04/20/2015  CLINICAL DATA:  Acute on chronic renal failure.  Inpatient. EXAM: RENAL / URINARY TRACT ULTRASOUND COMPLETE COMPARISON:  03/10/2015 CT abdomen/ pelvis. 04/14/2014 abdominal sonogram. FINDINGS: Right Kidney: Length: 12.0 cm. Mildly echogenic right kidney. No right hydronephrosis. No right renal mass. Incidentally noted is an irregular liver contour in the visualize right liver lobe, in keeping with known cirrhosis. Left Kidney: Length: 12.4 cm. Mildly echogenic left kidney. No left hydronephrosis. No left renal mass. Bladder: Appears normal for degree of bladder distention. A left ureteral jet was demonstrated. No right ureteral jet was  detected during the observation period. IMPRESSION: 1. No hydronephrosis. 2. Echogenic kidneys, indicating nonspecific renal parenchymal disease of uncertain chronicity. Kidneys are normal size. 3. Normal bladder. 4. Incidental cirrhosis. Electronically Signed   By: Delbert Phenix M.D.   On: 04/20/2015 12:24    Scheduled Meds: . Chlorhexidine Gluconate Cloth  6 each Topical Q0600  . feeding supplement (ENSURE ENLIVE)  237 mL Oral Daily  . furosemide  40 mg Intravenous Daily  . gabapentin  100 mg Oral TID  . lactulose  20 g Oral BID  . mupirocin ointment  1 application Nasal BID  . nadolol  20 mg Oral Daily  . pantoprazole  40 mg Oral Daily  . sodium chloride flush  3 mL Intravenous Q12H   Continuous Infusions: . sodium chloride 75 mL/hr at 04/21/15 0303    Principal Problem:   Hyponatremia Active Problems:   Hepatic cirrhosis (HCC)   GERD (gastroesophageal reflux disease)   Peripheral neuropathy (HCC)   Thrombocytopenia (HCC)   Weakness   CKD (chronic kidney disease), stage III   Hypokalemia  Elevated troponin    Time spent: 25 minutes    Erick Blinks, MD. [facility] Pager [phone]. If 7PM-7AM, please contact night-coverage at [email or web address], password [facility] 04/21/2015, 7:28 AM  LOS: 5 days    By signing my name below, I, Adron Bene, attest that this documentation has been prepared under the direction and in the presence of Erick Blinks, MD. Electronically Signed: Adron Bene 04/21/2015  1:45pm  I, Dr. Erick Blinks, personally performed the services described in this documentaiton. All medical record entries made by the scribe were at my direction and in my presence. I have reviewed the chart and agree that the record reflects my personal performance and is accurate and complete  Erick Blinks, MD, 04/21/2015 2:03 PM

## 2015-04-22 LAB — RENAL FUNCTION PANEL
ANION GAP: 5 (ref 5–15)
Albumin: 2.4 g/dL — ABNORMAL LOW (ref 3.5–5.0)
BUN: 33 mg/dL — ABNORMAL HIGH (ref 6–20)
CO2: 17 mmol/L — AB (ref 22–32)
Calcium: 8.4 mg/dL — ABNORMAL LOW (ref 8.9–10.3)
Chloride: 108 mmol/L (ref 101–111)
Creatinine, Ser: 1.77 mg/dL — ABNORMAL HIGH (ref 0.44–1.00)
GFR calc Af Amer: 35 mL/min — ABNORMAL LOW (ref >60)
GFR calc non Af Amer: 31 mL/min — ABNORMAL LOW (ref >60)
GLUCOSE: 136 mg/dL — AB (ref 65–99)
PHOSPHORUS: 3.7 mg/dL (ref 2.5–4.6)
POTASSIUM: 3.6 mmol/L (ref 3.5–5.1)
Sodium: 130 mmol/L — ABNORMAL LOW (ref 135–145)

## 2015-04-22 MED ORDER — SPIRONOLACTONE 25 MG PO TABS: 25.0000 mg | ORAL_TABLET | Freq: Every day | ORAL | Status: DC

## 2015-04-22 MED ORDER — MESALAMINE 1000 MG RE SUPP: 1000.0000 mg | Freq: Every day | RECTAL | Status: DC

## 2015-04-22 MED ORDER — FUROSEMIDE 20 MG PO TABS: 20.0000 mg | ORAL_TABLET | Freq: Every day | ORAL | Status: DC

## 2015-04-22 NOTE — Progress Notes (Signed)
Kathryn [name]  MRN: [id number]  DOB/AGE: September 26, 1955 60 y.o.  Primary Care Physician:Christy Lendon Colonel, FNP  Admit date: 04/16/2015  Chief Complaint:  Chief Complaint  Patient presents with  . Abnormal Lab    low sodium    S-Pt presented on  04/16/2015 with  Chief Complaint  Patient presents with  . Abnormal Lab    low sodium  .    Pt says " I feel much better".   Meds . Chlorhexidine Gluconate Cloth  6 each Topical Q0600  . feeding supplement (ENSURE ENLIVE)  237 mL Oral Daily  . gabapentin  100 mg Oral TID  . lactulose  20 g Oral BID  . mesalamine  1,000 mg Rectal QHS  . mupirocin ointment  1 application Nasal BID  . nadolol  20 mg Oral Daily  . pantoprazole  40 mg Oral Daily  . potassium chloride  30 mEq Oral BID  . sodium chloride flush  3 mL Intravenous Q12H         Physical Exam: Vital signs in last 24 hours: Temp:  [98.2 F (36.8 C)-98.5 F (36.9 C)] 98.2 F (36.8 C) (03/15 0520) Pulse Rate:  [71-74] 74 (03/15 0520) Resp:  [18-20] 20 (03/15 0520) BP: (96-101)/(33-51) 96/33 mmHg (03/15 0520) SpO2:  [95 %-100 %] 100 % (03/15 0520) Weight change:  Last BM Date: 04/20/15  Intake/Output from previous Kathryn: 03/14 0701 - 03/15 0700 In: 720 [P.O.:720] Out: 600 [Urine:600]     Physical Exam: General- pt is awake,alert, oriented to time place and person Resp- No acute REsp distress, CTA B/L NO Rhonchi CVS- S1S2 regular in rate and rhythm GIT- BS+, soft, NT, ND,. EXT- NO LE Edema, NoCyanosis   Lab Results: Hgb  10.4  On  04/19/15   BMET  Recent Labs  04/21/15 0521 04/22/15 0446  NA 129* 130*  K 2.8* 3.6  CL 106 108  CO2 18* 17*  GLUCOSE 128* 136*  BUN 37* 33*  CREATININE 1.90* 1.77*  CALCIUM 8.0* 8.4*   Sodium trend 2017  113=>129=>130  Creat trend 2017   1.5--1.9   MICRO Recent Results (from the past 240 hour(s))  Urine culture     Status: Abnormal   Collection Time: 04/15/15 11:32 AM  Result Value Ref Range Status   Urine  Culture, Routine Final report (A)  Final   Urine Culture result 1 Staphylococcus aureus (A)  Final    Comment: 50,000-100,000 colony forming units per mL Methicillin resistant (MRSA) Based on resistance to oxacillin this isolate would be resistant to all currently available beta-lactam antimicrobial agents, with the exception of the newer cephalosporins with anti-MRSA activity, such as Ceftaroline    RESULT 2 Comment  Final    Comment: Mixed urogenital flora 10,000-25,000 colony forming units per mL    ANTIMICROBIAL SUSCEPTIBILITY Comment  Final    Comment:       ** S = Susceptible; I = Intermediate; R = Resistant **                    P = Positive; N = Negative             MICS are expressed in micrograms per mL    Antibiotic                 RSLT#1    RSLT#2    RSLT#3    RSLT#4 Ciprofloxacin  R Gentamicin                     S Levofloxacin                   R Linezolid                      S Nitrofurantoin                 S Oxacillin                      R Penicillin                     R Rifampin                       R Tetracycline                   S Trimethoprim/Sulfa             S Vancomycin                     S   Microscopic Examination     Status: Abnormal   Collection Time: 04/15/15  2:22 PM  Result Value Ref Range Status   WBC, UA >30 (H) 0 -  5 /hpf Final   RBC, UA >30 (H) 0 -  2 /hpf Final   Epithelial Cells (non renal) 0-10 0 - 10 /hpf Final   Bacteria, UA Many (A) None seen/Few Final  Urine culture     Status: None   Collection Time: 04/16/15 11:45 PM  Result Value Ref Range Status   Specimen Description URINE, CLEAN CATCH  Final   Special Requests NONE  Final   Culture   Final    >=100,000 COLONIES/mL METHICILLIN RESISTANT STAPHYLOCOCCUS AUREUS Performed at [facility]    Report Status 04/19/2015 FINAL  Final   Organism ID, Bacteria METHICILLIN RESISTANT STAPHYLOCOCCUS AUREUS  Final      Susceptibility   Methicillin resistant  staphylococcus aureus - MIC*    CIPROFLOXACIN >=8 RESISTANT Resistant     GENTAMICIN <=0.5 SENSITIVE Sensitive     NITROFURANTOIN <=16 SENSITIVE Sensitive     OXACILLIN >=4 RESISTANT Resistant     TETRACYCLINE <=1 SENSITIVE Sensitive     VANCOMYCIN 1 SENSITIVE Sensitive     TRIMETH/SULFA <=10 SENSITIVE Sensitive     CLINDAMYCIN <=0.25 RESISTANT Resistant     RIFAMPIN >=32 RESISTANT Resistant     Inducible Clindamycin POSITIVE Resistant     * >=100,000 COLONIES/mL METHICILLIN RESISTANT STAPHYLOCOCCUS AUREUS      Lab Results  Component Value Date   CALCIUM 8.4* 04/22/2015   PHOS 3.7 04/22/2015       Impression: 1)Renal                 CKD stage 3.               CKD since 2015               CKD secondary to HTN                Progression of CKD slow                 2)CVs- hemodynamically stable    3)Anemia HGb stable  4)ID- hx of HEp C  Urine cx showed MRSA          Primary team following   5)Hypontremia         Hypervolemia hyponatremia         Sodium improving slowly  6)Hypokalemia          Lactulose and Diuretics          Now better   7)Acid base Co2 not  at goal Gi losses    Plan:  Will continue current care If pt is being d/ced , would benefit from outpt follow up with nephrology.     Shatoya Roets S 04/22/2015, 12:06 PM

## 2015-04-22 NOTE — Care Management Note (Signed)
Case Management Note  Patient Details  Name: Kathryn [name] MRN: [id number] Date of Birth: [birth year]  Expected Discharge Date:     04/22/2015             Expected Discharge Plan:  Home/Self Care  In-House Referral:  NA  Discharge planning Services  CM Consult  Post Acute Care Choice:  NA Choice offered to:  NA  DME Arranged:    DME Agency:     HH Arranged:    HH Agency:     Status of Service:  Completed, signed off  Medicare Important Message Given:  Yes Date Medicare IM Given:    Medicare IM give by:    Date Additional Medicare IM Given:    Additional Medicare Important Message give by:     If discussed at Long Length of Stay Meetings, dates discussed:    Additional Comments: Anticipate DC home today. Pt's OP PT appointment made through [facility] with pt's approval. Pt also made weekly f/u visits with PCP for next 4 weeks in attempt to keep pt out of hospital. No further CM needs.   Malcolm Metro, RN 04/22/2015, 11:58 AM

## 2015-04-22 NOTE — Discharge Summary (Signed)
Physician Discharge Summary  Kathryn [name] ZOX:[id number] DOB: 11-11-1955 DOA: 04/16/2015  PCP: [name], FNP  Admit date: 04/16/2015 Discharge date: 04/22/2015  Time spent: 35 minutes  Recommendations for Outpatient Follow-up:  1. Follow up with nephrology in 2 weeks 2. Follow up with Dr. [name] in the next 1-2 weeks   Discharge Diagnoses:  Principal Problem:   Hyponatremia Active Problems:   Hepatic cirrhosis (HCC)   GERD (gastroesophageal reflux disease)   Peripheral neuropathy (HCC)   Thrombocytopenia (HCC)   Weakness   CKD (chronic kidney disease), stage III   Hypokalemia   Elevated troponin   Discharge Condition: improved  Diet recommendation: low salt  Filed Weights   04/16/15 1019 04/16/15 1509  Weight: 90.719 kg (200 lb) 89.223 kg (196 lb 11.2 oz)    History of present illness:  Patient was admitted to the hospital with generalized weakness. She had gone to her PCP on the day before admission due to generalized weakness and fatigue. Routine labs were drawn and she was found to have a low sodium at 113. She was referred to the ED where she was subsequently admitted.  Hospital Course:  Patient was followed by nephrology during her hospital course. She was felt to have hypervolemic hypernatremia and was treated with intravenous lasix. Her sodium has improved. He will be maintained on low dose lasix and aldactone. She will follow up with nephrology in 2 weeks  Her creatinine did also rise early in her admission, likely related to hydration, but has since stabilized/improved. She has CKD stage 3. She will follow up with nephrology  She did complain of discharge from anal area. This was discussed with Dr. 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 He recommended that patient receive mesalamine suppositories daily he will follow her up in the office.  Patient is otherwise stable for discharge home  today.  Procedures:    Consultations:  Nephrology  Discharge Exam: Filed Vitals:   04/21/15 2328 04/22/15 0520  BP: 100/51 96/33  Pulse: 73 74  Temp: 98.5 F (36.9 C) 98.2 F (36.8 C)  Resp: 20 20    General: NAD Cardiovascular: s1, s2, rrr Respiratory: cta b  Discharge Instructions   Discharge Instructions    Diet - low sodium heart healthy    Complete by:  As directed      Increase activity slowly    Complete by:  As directed           Current Discharge Medication List    START taking these medications   Details  furosemide (LASIX) 20 MG tablet Take 1 tablet (20 mg total) by mouth daily. Qty: 30 tablet, Refills: 1    mesalamine (CANASA) 1000 MG suppository Place 1 suppository (1,000 mg total) rectally at bedtime. Qty: 30 suppository, Refills: 12    spironolactone (ALDACTONE) 25 MG tablet Take 1 tablet (25 mg total) by mouth daily. Qty: 30 tablet, Refills: 1      CONTINUE these medications which have NOT CHANGED   Details  ALPRAZolam (XANAX) 1 MG tablet Take 1 tablet (1 mg total) by mouth at bedtime as needed. Qty: 30 tablet, Refills: 2    Cholecalciferol (VITAMIN D-1000 MAX ST) 1000 UNITS tablet Take 1,000 Units by mouth daily.    feeding supplement, ENSURE ENLIVE, (ENSURE ENLIVE) LIQD Take 237 mLs by mouth daily.    gabapentin (NEURONTIN) 100 MG capsule Take 1 capsule (100 mg total) by mouth 3 (three) times daily. Qty: 270 capsule, Refills:  0    lactulose (CHRONULAC) 10 GM/15ML solution Take 30 mLs (20 g total) by mouth 2 (two) times daily. Qty: 1892 mL, Refills: 1    Multiple Vitamins-Minerals (MULTIVITAMIN WITH MINERALS) tablet Take 1 tablet by mouth daily.    nadolol (CORGARD) 20 MG tablet Take 20 mg by mouth daily.    pantoprazole (PROTONIX) 40 MG tablet TAKE 1 TABLET BY MOUTH DAILY Qty: 30 tablet, Refills: 3    potassium chloride (K-DUR) 10 MEQ tablet Take 1 tablet (10 mEq total) by mouth daily. Qty: 30 tablet, Refills: 3    Probiotic  Product (PROBIOTIC PO) Take 1 tablet by mouth daily.       Allergies  Allergen Reactions  . Penicillins Rash    Has patient had a PCN reaction causing immediate rash, facial/tongue/throat swelling, SOB or lightheadedness with hypotension: Yes Has patient had a PCN reaction causing severe rash involving mucus membranes or skin necrosis: No Has patient had a PCN reaction that required hospitalization No Has patient had a PCN reaction occurring within the last 10 years: No If all of the above answers are "NO", then may proceed with Cephalosporin use.    Follow-up Information    Follow up with [facility] On 04/27/2015.   Why:  @ 225pm   Contact information:   [address] [place] [address] [phone]      Follow up with [facility] On 05/04/2015.   Why:  @ 225   Contact information:   [address] [place] [address] [phone]      Follow up with [facility] On 05/12/2015.   Why:  @ 225pm   Contact information:   [address] [place] [address] [phone]      Follow up with [facility] On 05/19/2015.   Why:  @ 225   Contact information:   [address] [place] [address] [phone]      Follow up with [facility] On 05/12/2015.   Specialty:  Rehabilitation   Why:  @8 :45 am, arrive at 8:30am, bring insurance card, photo ID, list of meds   Contact information:   [address] [id number] [place] [address] [phone]       The results of significant diagnostics from this hospitalization (including imaging, microbiology, ancillary and laboratory) are listed below for reference.    Significant Diagnostic Studies: US Renal  04/20/2015  CLINICAL DATA:  Acute on chronic renal failure.  Inpatient. EXAM: RENAL / URINARY TRACT  ULTRASOUND COMPLETE COMPARISON:  03/10/2015 CT abdomen/ pelvis. 04/14/2014 abdominal sonogram. FINDINGS: Right Kidney: Length: 12.0 cm. Mildly echogenic right kidney. No right hydronephrosis. No right renal mass. Incidentally noted is an irregular liver contour in the visualize right liver lobe, in keeping with known cirrhosis. Left Kidney: Length: 12.4 cm. Mildly echogenic left kidney. No left hydronephrosis. No left renal mass. Bladder: Appears normal for degree of bladder distention. A left ureteral jet was demonstrated. No right ureteral jet was detected during the observation period. IMPRESSION: 1. No hydronephrosis. 2. Echogenic kidneys, indicating nonspecific renal parenchymal disease of uncertain chronicity. Kidneys are normal size. 3. Normal bladder. 4. Incidental cirrhosis. Electronically Signed   By: Delbert Phenix M.D.   On: 04/20/2015 12:24   Dg Chest Port 1 View  04/16/2015  CLINICAL DATA:  Hyponatremia.  Short of breath EXAM: PORTABLE CHEST 1 VIEW COMPARISON:  03/26/2015 FINDINGS:  The heart size and mediastinal contours are within normal limits. Both lungs are clear. The visualized skeletal structures are unremarkable. IMPRESSION: No active disease. Electronically Signed   By: Marlan Palau M.D.   On: 04/16/2015 20:05   Dg Chest Portable 1 View  03/26/2015  CLINICAL DATA:  Increasing shortness of breath with dry nonproductive cough for 1 day. History of hypertension. Smoker. EXAM: PORTABLE CHEST 1 VIEW COMPARISON:  03/10/2015 FINDINGS: The heart size and mediastinal contours are within normal limits. Both lungs are clear. The visualized skeletal structures are unremarkable. IMPRESSION: No active disease. Electronically Signed   By: Burman Nieves M.D.   On: 03/26/2015 03:02    Microbiology: Recent Results (from the past 240 hour(s))  Urine culture     Status: Abnormal   Collection Time: 04/15/15 11:32 AM  Result Value Ref Range Status   Urine Culture, Routine Final report (A)  Final    Urine Culture result 1 Staphylococcus aureus (A)  Final    Comment: 50,000-100,000 colony forming units per mL Methicillin resistant (MRSA) Based on resistance to oxacillin this isolate would be resistant to all currently available beta-lactam antimicrobial agents, with the exception of the newer cephalosporins with anti-MRSA activity, such as Ceftaroline    RESULT 2 Comment  Final    Comment: Mixed urogenital flora 10,000-25,000 colony forming units per mL    ANTIMICROBIAL SUSCEPTIBILITY Comment  Final    Comment:       ** S = Susceptible; I = Intermediate; R = Resistant **                    P = Positive; N = Negative             MICS are expressed in micrograms per mL    Antibiotic                 RSLT#1    RSLT#2    RSLT#3    RSLT#4 Ciprofloxacin                  R Gentamicin                     S Levofloxacin                   R Linezolid                      S Nitrofurantoin                 S Oxacillin                      R Penicillin                     R Rifampin                       R Tetracycline                   S Trimethoprim/Sulfa             S Vancomycin                     S   Microscopic Examination     Status: Abnormal   Collection Time: 04/15/15  2:22 PM  Result Value Ref Range Status   WBC, UA >30 (H) 0 -  5 /hpf  Final   RBC, UA >30 (H) 0 -  2 /hpf Final   Epithelial Cells (non renal) 0-10 0 - 10 /hpf Final   Bacteria, UA Many (A) None seen/Few Final  Urine culture     Status: None   Collection Time: 04/16/15 11:45 PM  Result Value Ref Range Status   Specimen Description URINE, CLEAN CATCH  Final   Special Requests NONE  Final   Culture   Final    >=100,000 COLONIES/mL METHICILLIN RESISTANT STAPHYLOCOCCUS AUREUS Performed at [facility]    Report Status 04/19/2015 FINAL  Final   Organism ID, Bacteria METHICILLIN RESISTANT STAPHYLOCOCCUS AUREUS  Final      Susceptibility   Methicillin resistant staphylococcus aureus - MIC*    CIPROFLOXACIN  >=8 RESISTANT Resistant     GENTAMICIN <=0.5 SENSITIVE Sensitive     NITROFURANTOIN <=16 SENSITIVE Sensitive     OXACILLIN >=4 RESISTANT Resistant     TETRACYCLINE <=1 SENSITIVE Sensitive     VANCOMYCIN 1 SENSITIVE Sensitive     TRIMETH/SULFA <=10 SENSITIVE Sensitive     CLINDAMYCIN <=0.25 RESISTANT Resistant     RIFAMPIN >=32 RESISTANT Resistant     Inducible Clindamycin POSITIVE Resistant     * >=100,000 COLONIES/mL METHICILLIN RESISTANT STAPHYLOCOCCUS AUREUS     Labs: Basic Metabolic Panel:  Recent Labs Lab 04/16/15 1035  04/18/15 0656 04/19/15 0614 04/20/15 0740 04/21/15 0521 04/22/15 0446  NA 115*  < > 126* 125* 124* 129* 130*  K 2.8*  < > 3.7 3.5 3.3* 2.8* 3.6  CL 93*  < > 104 101 102 106 108  CO2 18*  < > 18* 20* 18* 18* 17*  GLUCOSE 125*  < > 165* 119* 96 128* 136*  BUN 43*  < > 33* 36* 38* 37* 33*  CREATININE 1.49*  < > 1.69* 1.93* 1.95* 1.90* 1.77*  CALCIUM 8.7*  < > 8.3* 8.0* 8.0* 8.0* 8.4*  MG 1.8  --   --   --   --   --   --   PHOS  --   --   --   --  4.0 3.9 3.7  < > = values in this interval not displayed. Liver Function Tests:  Recent Labs Lab 04/16/15 1035 04/20/15 0740 04/21/15 0521 04/22/15 0446  AST 38  --   --   --   ALT 24  --   --   --   ALKPHOS 164*  --   --   --   BILITOT 1.9*  --   --   --   PROT 5.8*  --   --   --   ALBUMIN 2.9* 2.4* 2.3* 2.4*   No results for input(s): LIPASE, AMYLASE in the last 168 hours.  Recent Labs Lab 04/16/15 1035  AMMONIA 24   CBC:  Recent Labs Lab 04/16/15 1035 04/17/15 0625 04/19/15 0614  WBC 10.0 9.6 8.3  NEUTROABS 7.3  --   --   HGB 12.2 11.3* 10.4*  HCT 32.9* 31.9* 29.0*  MCV 81.8 83.3 85.5  PLT 104* 112* 81*   Cardiac Enzymes:  Recent Labs Lab 04/16/15 1035 04/16/15 1947 04/17/15 0110 04/17/15 0625  TROPONINI 0.10* 0.07* 0.06* 0.07*   BNP: BNP (last 3 results)  Recent Labs  03/26/15 0345  BNP 74.0    ProBNP (last 3 results) No results for input(s): PROBNP in the last  8760 hours.  CBG: No results for input(s): GLUCAP in the last 168 hours.  SignedErick Blinks MD.  [facility] 04/22/2015, 12:41 PM

## 2015-04-22 NOTE — Care Management Important Message (Signed)
Important Message  Patient Details  Name: Kathryn [name] MRN: [id number] Date of Birth: 02/07/1956   Medicare Important Message Given:  Yes    Malcolm Metro, RN 04/22/2015, 11:58 AM

## 2015-04-23 ENCOUNTER — Other Ambulatory Visit: Payer: Self-pay

## 2015-04-23 ENCOUNTER — Other Ambulatory Visit: Payer: Self-pay | Admitting: *Deleted

## 2015-04-23 MED ORDER — GABAPENTIN 100 MG PO CAPS: 100.0000 mg | ORAL_CAPSULE | Freq: Three times a day (TID) | ORAL | Status: DC

## 2015-04-27 ENCOUNTER — Encounter: Payer: Self-pay | Admitting: Family

## 2015-04-27 ENCOUNTER — Ambulatory Visit (INDEPENDENT_AMBULATORY_CARE_PROVIDER_SITE_OTHER): Payer: Medicare HMO | Admitting: Family

## 2015-04-27 VITALS — BP 97/66 | HR 64 | Temp 97.6°F | Ht 65.0 in | Wt 206.0 lb

## 2015-04-27 DIAGNOSIS — Z114 Encounter for screening for human immunodeficiency virus [HIV]: Secondary | ICD-10-CM

## 2015-04-27 DIAGNOSIS — Z09 Encounter for follow-up examination after completed treatment for conditions other than malignant neoplasm: Secondary | ICD-10-CM | POA: Diagnosis not present

## 2015-04-27 DIAGNOSIS — Z1211 Encounter for screening for malignant neoplasm of colon: Secondary | ICD-10-CM

## 2015-04-27 DIAGNOSIS — I959 Hypotension, unspecified: Secondary | ICD-10-CM

## 2015-04-27 DIAGNOSIS — Z1239 Encounter for other screening for malignant neoplasm of breast: Secondary | ICD-10-CM | POA: Diagnosis not present

## 2015-04-27 DIAGNOSIS — E871 Hypo-osmolality and hyponatremia: Secondary | ICD-10-CM

## 2015-04-27 DIAGNOSIS — N183 Chronic kidney disease, stage 3 unspecified: Secondary | ICD-10-CM

## 2015-04-27 DIAGNOSIS — E876 Hypokalemia: Secondary | ICD-10-CM

## 2015-04-27 NOTE — Progress Notes (Signed)
   Subjective:    Patient ID: Kathryn [name], female    DOB: [birth year], 60 y.o.   MRN: [id number]  HPI Pt presents to the office today for hospital follow up for hyponatremia, weakness, CKD stage 3, and hypokalemia. Pt reports feeling "stronger", but continues to be weak and has no appetite. Pt denies any headache, palpitations, SOB, or edema at this time. Pt has appt with GI on Thursday for follow up.      Review of Systems  Constitutional: Negative.   HENT: Negative.   Eyes: Negative.   Respiratory: Negative.  Negative for shortness of breath.   Cardiovascular: Negative.  Negative for palpitations.  Gastrointestinal: Negative.   Endocrine: Negative.   Genitourinary: Negative.   Musculoskeletal: Negative.   Neurological: Negative.  Negative for headaches.  Hematological: Negative.   Psychiatric/Behavioral: Negative.   All other systems reviewed and are negative.      Objective:   Physical Exam  Constitutional: She is oriented to person, place, and time. She appears well-developed and well-nourished. No distress.  HENT:  Head: Normocephalic and atraumatic.  Eyes: Pupils are equal, round, and reactive to light.  Neck: Normal range of motion. Neck supple. No thyromegaly present.  Cardiovascular: Normal rate, regular rhythm, normal heart sounds and intact distal pulses.   No murmur heard. Pulmonary/Chest: Effort normal and breath sounds normal. No respiratory distress. She has no wheezes.  Abdominal: Soft. Bowel sounds are normal. She exhibits no distension. There is no tenderness.  Musculoskeletal: Normal range of motion. She exhibits no edema or tenderness.  Neurological: She is alert and oriented to person, place, and time.  Skin: Skin is warm and dry.  Psychiatric: She has a normal mood and affect. Her behavior is normal. Judgment and thought content normal.  Vitals reviewed.     BP 97/66 mmHg  Pulse 64  Temp(Src) 97.6 F (36.4 C) (Oral)  Ht '5\' 5"'$  (1.651 m)  Wt 206  lb (93.441 kg)  BMI 34.28 kg/m2      Assessment & Plan:  1. Hyponatremia - CMP14+EGFR  2. Hypokalemia - CMP14+EGFR  3. Hospital discharge follow-up - CMP14+EGFR  4. Colon cancer screening - Fecal occult blood, imunochemical; Future - CMP14+EGFR  5. Breast cancer screening - HM MAMMOGRAPHY - CMP14+EGFR  6. Hypotension, unspecified hypotension type  7. CKD (chronic kidney disease), stage III  8. Screening for HIV (human immunodeficiency virus) - HIV antibody   Continue all meds Labs pending Health Maintenance reviewed Diet and exercise encouraged RTO 2 weeks to recheck lab work  [name], [place]

## 2015-04-27 NOTE — Patient Instructions (Signed)
Hyponatremia °Hyponatremia is when the amount of salt (sodium) in your blood is too low. When sodium levels are low, your cells absorb extra water and they swell. The swelling happens throughout the body, but it mostly affects the brain. °CAUSES °This condition may be caused by: °· Heart, kidney, or liver problems. °· Thyroid problems. °· Adrenal gland problems. °· Metabolic conditions, such as syndrome of inappropriate antidiuretic hormone (SIADH). °· Severe vomiting and diarrhea. °· Certain medicines or illegal drugs. °· Dehydration. °· Drinking too much water. °· Eating a diet that is low in sodium. °· Large burns on your body. °· Sweating. °RISK FACTORS °This condition is more likely to develop in people who: °· Have long-term (chronic) kidney disease. °· Have heart failure. °· Have a medical condition that causes frequent or excessive diarrhea. °· Have metabolic conditions, such as Addison disease or SIADH. °· Take certain medicines that affect the sodium and fluid balance in the blood. Some of these medicine types include: °¨ Diuretics. °¨ NSAIDs. °¨ Some opioid pain medicines. °¨ Some antidepressants. °¨ Some seizure prevention medicines. °SYMPTOMS  °Symptoms of this condition include: °· Nausea and vomiting. °· Confusion. °· Lethargy. °· Agitation. °· Headache. °· Seizures. °· Unconsciousness. °· Appetite loss. °· Muscle weakness and cramping. °· Feeling weak or light-headed. °· Having a rapid heart rate. °· Fainting, in severe cases. °DIAGNOSIS °This condition is diagnosed with a medical history and physical exam. You will also have other tests, including: °· Blood tests. °· Urine tests. °TREATMENT °Treatment for this condition depends on the cause. Treatment may include: °· Fluids given through an IV tube that is inserted into one of your veins. °· Medicines to correct the sodium imbalance. If medicines are causing the condition, the medicines will need to be adjusted. °· Limiting water or fluid intake to  get the correct sodium balance. °HOME CARE INSTRUCTIONS °· Take medicines only as directed by your health care provider. Many medicines can make this condition worse. Talk with your health care provider about any medicines that you are currently taking. °· Carefully follow a recommended diet as directed by your health care provider. °· Carefully follow instructions from your health care provider about fluid restrictions. °· Keep all follow-up visits as directed by your health care provider. This is important. °· Do not drink alcohol. °SEEK MEDICAL CARE IF: °· You develop worsening nausea, fatigue, headache, confusion, or weakness. °· Your symptoms go away and then return. °· You have problems following the recommended diet. °SEEK IMMEDIATE MEDICAL CARE IF: °· You have a seizure. °· You faint. °· You have ongoing diarrhea or vomiting. °  °This information is not intended to replace advice given to you by your health care provider. Make sure you discuss any questions you have with your health care provider. °  °Document Released: 01/14/2002 Document Revised: 06/10/2014 Document Reviewed: 02/13/2014 °Elsevier Interactive Patient Education ©2016 [employer] ° ° ° °

## 2015-04-28 LAB — CMP14+EGFR
ALK PHOS: 170 IU/L — AB (ref 39–117)
ALT: 16 IU/L (ref 0–32)
AST: 21 IU/L (ref 0–40)
Albumin/Globulin Ratio: 1.4 (ref 1.2–2.2)
Albumin: 3 g/dL — ABNORMAL LOW (ref 3.5–5.5)
BILIRUBIN TOTAL: 0.9 mg/dL (ref 0.0–1.2)
BUN / CREAT RATIO: 19 (ref 9–23)
BUN: 33 mg/dL — AB (ref 6–24)
CO2: 15 mmol/L — ABNORMAL LOW (ref 18–29)
Calcium: 8.5 mg/dL — ABNORMAL LOW (ref 8.7–10.2)
Chloride: 105 mmol/L (ref 96–106)
Creatinine, Ser: 1.71 mg/dL — ABNORMAL HIGH (ref 0.57–1.00)
GFR calc Af Amer: 38 mL/min/{1.73_m2} — ABNORMAL LOW (ref >59)
GFR calc non Af Amer: 33 mL/min/{1.73_m2} — ABNORMAL LOW (ref >59)
GLUCOSE: 124 mg/dL — AB (ref 65–99)
Globulin, Total: 2.1 g/dL (ref 1.5–4.5)
POTASSIUM: 3.7 mmol/L (ref 3.5–5.2)
SODIUM: 133 mmol/L — AB (ref 134–144)
Total Protein: 5.1 g/dL — ABNORMAL LOW (ref 6.0–8.5)

## 2015-04-28 LAB — HIV ANTIBODY (ROUTINE TESTING W REFLEX): HIV SCREEN 4TH GENERATION: NONREACTIVE

## 2015-04-30 ENCOUNTER — Encounter (INDEPENDENT_AMBULATORY_CARE_PROVIDER_SITE_OTHER): Payer: Self-pay | Admitting: Internal Medicine

## 2015-04-30 ENCOUNTER — Ambulatory Visit (INDEPENDENT_AMBULATORY_CARE_PROVIDER_SITE_OTHER): Payer: Medicare HMO | Admitting: Internal Medicine

## 2015-04-30 VITALS — BP 114/68 | HR 60 | Temp 97.4°F | Ht 65.5 in | Wt 204.3 lb

## 2015-04-30 DIAGNOSIS — B1710 Acute hepatitis C without hepatic coma: Secondary | ICD-10-CM

## 2015-04-30 DIAGNOSIS — K7460 Unspecified cirrhosis of liver: Secondary | ICD-10-CM

## 2015-04-30 DIAGNOSIS — K5010 Crohn's disease of large intestine without complications: Secondary | ICD-10-CM | POA: Diagnosis not present

## 2015-04-30 LAB — AMMONIA: Ammonia: 171 umol/L — ABNORMAL HIGH (ref 16–53)

## 2015-04-30 NOTE — Progress Notes (Addendum)
Subjective:    Patient ID: Kathryn [name], female    DOB: [birth year], 60 y.o.   MRN: [id number]  HPI   Admitted to [facility] this month with generalized weakness. She was seen by her PCP [facility] [place])  the day before admission and her sodium was low at 113 . Se was referred to the ED and ultimately admitted.  Per records, it was felt to have hypervolemic hyponatremia and was treated with IV Lasix.  She also c/o of anal discharge. Hospitalist services discussed with Dr. [name] and recommended she receive mesalamine suppositories and to follow up at our office. Hx of cirrhosis complicated by Hepatitis C and was successfully treated with Harvoni in 2015. Hx Crohn's disease with multiple surgeries and has a ileostomy , iron deficiency anemia, thrombocytopenia presumed from her cirrhosis.  Hx of CRF stage 3.  04/27/2015 HIV non-reactive.  She tells me she has not taken the Mesalamine supp. They were not at [facility].  She tells me she is doing pretty good but her back hurts her. She is emptying her ileostomy bag 5-6 times a day. No melena or BRRB. Appetite comes and goes. She has lost 19 pound since November. She says she occasionally has confusion.  She is planning on exercising at the [facility].  OV next week with Dr. [name] for her anemia.  She says she has a small amt of bloody discharge from her rectum.   04/20/2015 US renal: acute and chronic renal failure.  IMPRESSION: 1. No hydronephrosis. 2. Echogenic kidneys, indicating nonspecific renal parenchymal disease of uncertain chronicity. Kidneys are normal size. 3. Normal bladder. 4. Incidental cirrhosis.     01/06/2015: US abdomen Surveillance of Hep C/Cirrhosis   IMPRESSION: 1. Heterogeneous hepatic echotexture with nodular contour consistent with cirrhosis. No focal hepatic abnormality. 2. TIPS patent. 3. Cholecystectomy.  No biliary distention.  06/10/2014 CT abdomen/pelvis with CM: abdominal pain:   IMPRESSION: 1. Left lower lobe  pneumonia. 2. No evidence of bowel obstruction. Right lower quadrant ileostomy without complication. 3. TIPS shunt in place without ascites.  Splenomegaly noted.    CBC    Component Value Date/Time   WBC 8.3 04/19/2015 0614   WBC 11.6* 04/15/2015 1132   RBC 3.39* 04/19/2015 0614   RBC 4.31 04/15/2015 1132   RBC 3.38* 01/09/2014 1600   HGB 10.4* 04/19/2015 0614   HCT 29.0* 04/19/2015 0614   HCT 37.2 04/15/2015 1132   PLT 81* 04/19/2015 0614   PLT 137* 04/15/2015 1132   MCV 85.5 04/19/2015 0614   MCV 86 04/15/2015 1132   MCH 30.7 04/19/2015 0614   MCH 29.9 04/15/2015 1132   MCHC 35.9 04/19/2015 0614   MCHC 34.7 04/15/2015 1132   RDW 14.8 04/19/2015 0614   RDW 15.4 04/15/2015 1132   LYMPHSABS 1.8 04/16/2015 1035   LYMPHSABS 2.9 04/15/2015 1132   MONOABS 0.8 04/16/2015 1035   EOSABS 0.1 04/16/2015 1035   EOSABS 0.3 04/15/2015 1132   EOSABS 0.1 12/23/2013 1601   BASOSABS 0.0 04/16/2015 1035   BASOSABS 0.0 04/15/2015 1132    Hepatic Function Panel     Component Value Date/Time   PROT 5.1* 04/27/2015 1528   PROT 5.8* 04/16/2015 1035   ALBUMIN 3.0* 04/27/2015 1528   ALBUMIN 2.4* 04/22/2015 0446   AST 21 04/27/2015 1528   ALT 16 04/27/2015 1528   ALKPHOS 170* 04/27/2015 1528   BILITOT 0.9 04/27/2015 1528   BILITOT 1.9* 04/16/2015 1035   BILIDIR 0.4* 12/31/2014 1020   IBILI 0.9  12/31/2014 1020   CMP Latest Ref Rng 04/27/2015 04/22/2015 04/21/2015  Glucose 65 - 99 mg/dL 295(A) 213(Y) 865(H)  BUN 6 - 24 mg/dL 84(O) 96(E) 95(M)  Creatinine 0.57 - 1.00 mg/dL 8.41(L) 2.44(W) 1.02(V)  Sodium 134 - 144 mmol/L 133(L) 130(L) 129(L)  Potassium 3.5 - 5.2 mmol/L 3.7 3.6 2.8(L)  Chloride 96 - 106 mmol/L 105 108 106  CO2 18 - 29 mmol/L 15(L) 17(L) 18(L)  Calcium 8.7 - 10.2 mg/dL 2.5(D) 6.6(Y) 4.0(H)  Total Protein 6.0 - 8.5 g/dL 5.1(L) - -  Total Bilirubin 0.0 - 1.2 mg/dL 0.9 - -  Alkaline Phos 39 - 117 IU/L 170(H) - -  AST 0 - 40 IU/L 21 - -  ALT 0 - 32 IU/L 16 - -         Review of Systems Past Medical History  Diagnosis Date  . Hypertension   . Enteritis presumed infectious   . Crohn's   . Anemia   . Hepatitis C antibody test positive   . Hepatitis   . Renal insufficiency   . Cirrhosis (HCC)   . Splenomegaly   . Bilateral leg edema 01/2014  . Chronic back pain   . Thrombocytopenia [facility])     Past Surgical History  Procedure Laterality Date  . Colon surgery      MULTIPLE SURGERIES FOR CROHNS  . Ileostomy      multiple abdominal surgeries for Crohn's by Dr. Gabriel Cirri  . Cholecystectomy    . Abscess drainage      abdominal  . Esophagogastroduodenoscopy  10/05/2010    Procedure: ESOPHAGOGASTRODUODENOSCOPY (EGD);  Surgeon: [name], MD;  Location: [facility];  Service: Endoscopy;  Laterality: N/A;  7:30 am  . Cirrhosis    . Esophagogastroduodenoscopy N/A 09/11/2013    Procedure: ESOPHAGOGASTRODUODENOSCOPY (EGD);  Surgeon: [name], MD;  Location: [facility];  Service: Endoscopy;  Laterality: N/A;  . Ileoscopy  09/11/2013    Procedure: ILEOSCOPY THROUGH STOMA;  Surgeon: [name], MD;  Location: [facility];  Service: Endoscopy;;  . Givens capsule study N/A 09/12/2013    Procedure: GIVENS CAPSULE STUDY;  Surgeon: [name], MD;  Location: [facility];  Service: Endoscopy;  Laterality: N/A;  . Givens capsule study N/A 11/25/2013    Procedure: GIVENS CAPSULE STUDY;  Surgeon: [name], MD;  Location: [facility];  Service: Endoscopy;  Laterality: N/A;  . Varices cauterization Right     in the stoma of her ileostomy  . Tips procedure  11/2013    [facility]     Allergies  Allergen Reactions  . Penicillins Rash    Has patient had a PCN reaction causing immediate rash, facial/tongue/throat swelling, SOB or lightheadedness with hypotension: Yes Has patient had a PCN reaction causing severe rash involving mucus membranes or skin necrosis: No Has patient had a PCN reaction that required hospitalization  No Has patient had a PCN reaction occurring within the last 10 years: No If all of the above answers are "NO", then may proceed with Cephalosporin use.     Current Outpatient Prescriptions on File Prior to Visit  Medication Sig Dispense Refill  . ALPRAZolam (XANAX) 1 MG tablet Take 1 tablet (1 mg total) by mouth at bedtime as needed. (Patient taking differently: Take 1 mg by mouth at bedtime as needed for sleep. ) 30 tablet 2  . Cholecalciferol (VITAMIN D-1000 MAX ST) 1000 UNITS tablet Take 1,000 Units by mouth daily.    Marland Kitchen  feeding supplement, ENSURE ENLIVE, (ENSURE ENLIVE) LIQD Take 237 mLs by mouth daily.    . furosemide (LASIX) 20 MG tablet Take 1 tablet (20 mg total) by mouth daily. 30 tablet 1  . gabapentin (NEURONTIN) 100 MG capsule Take 1 capsule (100 mg total) by mouth 3 (three) times daily. 270 capsule 0  . lactulose (CHRONULAC) 10 GM/15ML solution Take 30 mLs (20 g total) by mouth 2 (two) times daily. 1892 mL 1  . mesalamine (CANASA) 1000 MG suppository Place 1 suppository (1,000 mg total) rectally at bedtime. 30 suppository 12  . Multiple Vitamins-Minerals (MULTIVITAMIN WITH MINERALS) tablet Take 1 tablet by mouth daily.    . nadolol (CORGARD) 20 MG tablet Take 20 mg by mouth daily.    . pantoprazole (PROTONIX) 40 MG tablet TAKE 1 TABLET BY MOUTH DAILY 30 tablet 3  . potassium chloride (K-DUR) 10 MEQ tablet Take 1 tablet (10 mEq total) by mouth daily. 30 tablet 3  . Probiotic Product (PROBIOTIC PO) Take 1 tablet by mouth daily.    Marland Kitchen spironolactone (ALDACTONE) 25 MG tablet Take 1 tablet (25 mg total) by mouth daily. 30 tablet 1   No current facility-administered medications on file prior to visit.        Objective:   Physical Exam  Blood pressure 114/68, pulse 60, temperature 97.4 F (36.3 C), height 5' 5.5" (1.664 m), weight 204 lb 4.8 oz (92.67 kg). Alert and oriented. Skin warm and dry. Oral mucosa is moist.   . Sclera anicteric, conjunctivae is pink. Thyroid not enlarged.  No cervical lymphadenopathy. Lungs clear. Heart regular rate and rhythm.  Abdomen is soft. Bowel sounds are positive. No hepatomegaly. No abdominal masses felt. No tenderness.  2+ edema to lower extremities.           Assessment & Plan:  Cirrhosis: Will get hepatic function today. Hep C: Hep C quaint Crohn's disease: sedrate today (Has recent CBC) Samples of Canasa x 10 given to patient. OV in 6 months.

## 2015-04-30 NOTE — Patient Instructions (Signed)
Labs today. OV in 6 months.  

## 2015-05-01 LAB — HEPATIC FUNCTION PANEL
ALK PHOS: 128 U/L (ref 33–130)
ALT: 17 U/L (ref 6–29)
AST: 24 U/L (ref 10–35)
Albumin: 2.8 g/dL — ABNORMAL LOW (ref 3.6–5.1)
BILIRUBIN DIRECT: 0.4 mg/dL — AB (ref <=0.2)
BILIRUBIN INDIRECT: 0.9 mg/dL (ref 0.2–1.2)
BILIRUBIN TOTAL: 1.3 mg/dL — AB (ref 0.2–1.2)
TOTAL PROTEIN: 5.3 g/dL — AB (ref 6.1–8.1)

## 2015-05-01 LAB — HEPATITIS C RNA QUANTITATIVE: HCV QUANT: NOT DETECTED [IU]/mL (ref <15)

## 2015-05-01 LAB — SEDIMENTATION RATE: Sed Rate: 5 mm/hr (ref 0–30)

## 2015-05-01 LAB — AFP TUMOR MARKER: AFP TUMOR MARKER: 6.1 ng/mL — AB (ref <6.1)

## 2015-05-04 ENCOUNTER — Ambulatory Visit (INDEPENDENT_AMBULATORY_CARE_PROVIDER_SITE_OTHER): Payer: Medicare HMO | Admitting: Family

## 2015-05-04 ENCOUNTER — Other Ambulatory Visit: Payer: Self-pay | Admitting: Family

## 2015-05-04 ENCOUNTER — Encounter: Payer: Self-pay | Admitting: Family

## 2015-05-04 ENCOUNTER — Other Ambulatory Visit (INDEPENDENT_AMBULATORY_CARE_PROVIDER_SITE_OTHER): Payer: Self-pay | Admitting: Internal Medicine

## 2015-05-04 VITALS — BP 110/72 | HR 85 | Temp 97.0°F | Ht 65.5 in | Wt 198.8 lb

## 2015-05-04 DIAGNOSIS — I959 Hypotension, unspecified: Secondary | ICD-10-CM

## 2015-05-04 DIAGNOSIS — N183 Chronic kidney disease, stage 3 unspecified: Secondary | ICD-10-CM

## 2015-05-04 DIAGNOSIS — E876 Hypokalemia: Secondary | ICD-10-CM

## 2015-05-04 DIAGNOSIS — Z1211 Encounter for screening for malignant neoplasm of colon: Secondary | ICD-10-CM

## 2015-05-04 DIAGNOSIS — Z09 Encounter for follow-up examination after completed treatment for conditions other than malignant neoplasm: Secondary | ICD-10-CM

## 2015-05-04 DIAGNOSIS — E871 Hypo-osmolality and hyponatremia: Secondary | ICD-10-CM

## 2015-05-04 NOTE — Patient Instructions (Signed)
Idiopathic Thrombocytopenic Purpura °Idiopathic thrombocytopenic purpura (ITP) is a disease in which your body's defense system (immune system) attacks your platelets. Platelets are blood cells that help form clots and seal leaks in damaged blood vessels. With ITP, you to have too few platelets. As a result, you bleed more easily. It is also harder for your body to stop any bleeding. °In adults, ITP is usually a long-term disease. °CAUSES °The cause is unknown. ITP may develop after a viral infection, during pregnancy, or from an immune disorder. °RISK FACTORS °The risk of ITP may be greater among: °· Women. °· Adults 20-50 years old. °SIGNS AND SYMPTOMS °Common signs and symptoms include: °· Easy bruising. °· A cut that bleeds for a long time. °· Tiny purple blood dots (petechiae) under the skin, especially on the shins. °· Blood in urine or bowel movements. °· Nosebleeds. °· Bleeding gums. °· Heavy menstrual periods. °Mild forms of ITP may not cause any symptoms. °DIAGNOSIS °Your health care provider may suspect ITP based on your signs and symptoms. To make a diagnosis, your health care provider may do a physical exam and order blood tests to: °· Find how many platelets you have. °· See how well your blood clots. °Your health care provider may then do blood tests or a bone marrow test to rule out other conditions that could be causing your symptoms. °TREATMENT °Treatment depends on the severity of your condition. Options include: °· Monitoring of your condition and platelet count. °· Blood transfusions of antibodies or platelets. °· Medicines such as: °¨ Strong anti-inflammatory medicines (steroids). °¨ Medicines to boost platelet production. °¨ Medicines to suppress your immune system. °· Removal of your spleen. This may be done if other treatments do not work. °HOME CARE INSTRUCTIONS °· Take medicines only as directed by your health care provider. °· Do not take over-the-counter medicines that lower your  platelet count, affect platelet function, or affect your blood's ability to clot. These include aspirin and ibuprofen. °· Do not participate in contact sports or other high-risk activities. Ask your health care provider which activities are safe for you. °· Keep all follow-up visits as directed by your health care provider. This is important. °SEEK MEDICAL CARE IF: °· You have new symptoms. °· Your symptoms get worse. °SEEK IMMEDIATE MEDICAL CARE IF: °· You have a sudden severe headache or confusion. °· You have significant bleeding. °· You have nausea and vomiting. °  °This information is not intended to replace advice given to you by your health care provider. Make sure you discuss any questions you have with your health care provider. °  °Document Released: 08/21/2013 Document Reviewed: 08/21/2013 °Elsevier Interactive Patient Education ©2016 [employer] ° °

## 2015-05-04 NOTE — Progress Notes (Signed)
   Subjective:    Patient ID: Kathryn [name], female    DOB: Apr 02, 1955, 60 y.o.   MRN: [id number]  HPI  Pt presents to the office today for hospital follow up for hyponatremia, weakness, CKD stage 3, and hypokalemia. Pt reports feeling "stronger", but continues to be weak and has no appetite. Pt denies any headache, palpitations, SOB, or edema at this time. Pt had appt with GI since our last visit. Pt states she had blood work drawn was started on [employer] for rectal discharge. Pt has ileostomy  That she states is working well. PT is complaining of lower back pain. PT states the pain is constant while standing or driving of a 10 out 10. PT states she has tried icy hot with no relief. Pt states she starts working with physical therapy tomorrow.     Review of Systems  Constitutional: Negative.   HENT: Negative.   Eyes: Negative.   Respiratory: Negative.  Negative for shortness of breath.   Cardiovascular: Negative.  Negative for palpitations.  Gastrointestinal: Negative.   Endocrine: Negative.   Genitourinary: Negative.   Musculoskeletal: Negative.   Neurological: Negative.  Negative for headaches.  Hematological: Negative.   Psychiatric/Behavioral: Negative.   All other systems reviewed and are negative.      Objective:   Physical Exam  Constitutional: She is oriented to person, place, and time. She appears well-developed and well-nourished. No distress.  HENT:  Head: Normocephalic and atraumatic.  Eyes: Pupils are equal, round, and reactive to light.  Neck: Normal range of motion. Neck supple. No thyromegaly present.  Cardiovascular: Normal rate, regular rhythm, normal heart sounds and intact distal pulses.   No murmur heard. Pulmonary/Chest: Effort normal. No respiratory distress. She has no wheezes.  Diminished breath sounds   Abdominal: Soft. Bowel sounds are normal. She exhibits no distension. There is no tenderness.  Genitourinary:  Ostomy present   Musculoskeletal:  Normal range of motion. She exhibits edema (2+ in BLE). She exhibits no tenderness.  Neurological: She is alert and oriented to person, place, and time.  Skin: Skin is warm and dry.  Psychiatric: She has a normal mood and affect. Her behavior is normal. Judgment and thought content normal.  Vitals reviewed.     BP 110/72 mmHg  Pulse 85  Temp(Src) 97 F (36.1 C) (Oral)  Ht 5' 5.5" (1.664 m)  Wt 198 lb 12.8 oz (90.175 kg)  BMI 32.57 kg/m2      Assessment & Plan:  1. CKD (chronic kidney disease), stage III - CMP14+EGFR - Ammonia  2. Hyponatremia - CMP14+EGFR - Ammonia  3. Hypokalemia - CMP14+EGFR - Ammonia  4. Hypotension, unspecified hypotension type - CMP14+EGFR - Ammonia  5. Colon cancer screening - Fecal occult blood, imunochemical   6. Hospital discharge follow-up - CMP14+EGFR - Ammonia   Continue all meds Labs pending Health Maintenance reviewed Diet and exercise encouraged RTO 1 week  [name], FNP

## 2015-05-05 LAB — CMP14+EGFR
A/G RATIO: 1.3 (ref 1.2–2.2)
ALK PHOS: 164 IU/L — AB (ref 39–117)
ALT: 17 IU/L (ref 0–32)
AST: 23 IU/L (ref 0–40)
Albumin: 3.3 g/dL — ABNORMAL LOW (ref 3.5–5.5)
BILIRUBIN TOTAL: 1.4 mg/dL — AB (ref 0.0–1.2)
BUN/Creatinine Ratio: 22 (ref 9–23)
BUN: 40 mg/dL — AB (ref 6–24)
CHLORIDE: 103 mmol/L (ref 96–106)
CO2: 17 mmol/L — ABNORMAL LOW (ref 18–29)
Calcium: 9.3 mg/dL (ref 8.7–10.2)
Creatinine, Ser: 1.79 mg/dL — ABNORMAL HIGH (ref 0.57–1.00)
GFR calc Af Amer: 35 mL/min/{1.73_m2} — ABNORMAL LOW (ref >59)
GFR calc non Af Amer: 31 mL/min/{1.73_m2} — ABNORMAL LOW (ref >59)
GLUCOSE: 93 mg/dL (ref 65–99)
Globulin, Total: 2.5 g/dL (ref 1.5–4.5)
Potassium: 3.4 mmol/L — ABNORMAL LOW (ref 3.5–5.2)
Sodium: 135 mmol/L (ref 134–144)
TOTAL PROTEIN: 5.8 g/dL — AB (ref 6.0–8.5)

## 2015-05-05 LAB — AMMONIA: AMMONIA: 99 ug/dL — AB (ref 19–87)

## 2015-05-05 NOTE — Telephone Encounter (Signed)
Last seen 05/04/15  Kathryn [name]   If approved route to nurse to call into [facility]   [phone]

## 2015-05-07 ENCOUNTER — Encounter (INDEPENDENT_AMBULATORY_CARE_PROVIDER_SITE_OTHER): Payer: Self-pay | Admitting: Internal Medicine

## 2015-05-08 ENCOUNTER — Encounter: Payer: Self-pay | Admitting: *Deleted

## 2015-05-08 LAB — FECAL OCCULT BLOOD, IMMUNOCHEMICAL: FECAL OCCULT BLD: NEGATIVE

## 2015-05-12 ENCOUNTER — Telehealth (HOSPITAL_COMMUNITY): Payer: Self-pay

## 2015-05-12 ENCOUNTER — Ambulatory Visit (HOSPITAL_COMMUNITY): Payer: Medicare HMO | Attending: Family

## 2015-05-12 ENCOUNTER — Ambulatory Visit: Payer: Medicare HMO | Admitting: Family

## 2015-05-12 NOTE — Telephone Encounter (Signed)
Therapist called and notified the patient of her missed PT visit/no show for today's 8:45 am PT eval appt. Pt reported car trouble this morning and was unable to find transportation. Patient requested the clinic to call her back tomorrow to rescheduled her PT eval appt.   Bonnee Quin, PT, DPT

## 2015-05-13 ENCOUNTER — Encounter: Payer: Self-pay | Admitting: Family

## 2015-05-19 ENCOUNTER — Telehealth (INDEPENDENT_AMBULATORY_CARE_PROVIDER_SITE_OTHER): Payer: Self-pay | Admitting: *Deleted

## 2015-05-19 ENCOUNTER — Ambulatory Visit (INDEPENDENT_AMBULATORY_CARE_PROVIDER_SITE_OTHER): Payer: Medicare HMO | Admitting: Family

## 2015-05-19 ENCOUNTER — Encounter: Payer: Self-pay | Admitting: Family

## 2015-05-19 VITALS — BP 80/56 | HR 61 | Temp 97.5°F | Ht 65.0 in | Wt 192.4 lb

## 2015-05-19 DIAGNOSIS — K7290 Hepatic failure, unspecified without coma: Secondary | ICD-10-CM

## 2015-05-19 DIAGNOSIS — R1012 Left upper quadrant pain: Secondary | ICD-10-CM

## 2015-05-19 DIAGNOSIS — N179 Acute kidney failure, unspecified: Secondary | ICD-10-CM | POA: Diagnosis not present

## 2015-05-19 DIAGNOSIS — E871 Hypo-osmolality and hyponatremia: Secondary | ICD-10-CM | POA: Diagnosis not present

## 2015-05-19 DIAGNOSIS — K7460 Unspecified cirrhosis of liver: Secondary | ICD-10-CM

## 2015-05-19 DIAGNOSIS — E876 Hypokalemia: Secondary | ICD-10-CM | POA: Diagnosis not present

## 2015-05-19 DIAGNOSIS — R531 Weakness: Secondary | ICD-10-CM

## 2015-05-19 DIAGNOSIS — N189 Chronic kidney disease, unspecified: Secondary | ICD-10-CM

## 2015-05-19 DIAGNOSIS — K7469 Other cirrhosis of liver: Secondary | ICD-10-CM

## 2015-05-19 DIAGNOSIS — N183 Chronic kidney disease, stage 3 unspecified: Secondary | ICD-10-CM

## 2015-05-19 DIAGNOSIS — Z09 Encounter for follow-up examination after completed treatment for conditions other than malignant neoplasm: Secondary | ICD-10-CM | POA: Diagnosis not present

## 2015-05-19 DIAGNOSIS — I959 Hypotension, unspecified: Secondary | ICD-10-CM | POA: Diagnosis not present

## 2015-05-19 DIAGNOSIS — K7682 Hepatic encephalopathy: Secondary | ICD-10-CM

## 2015-05-19 LAB — URINALYSIS, COMPLETE
BILIRUBIN UA: NEGATIVE
GLUCOSE, UA: NEGATIVE
Ketones, UA: NEGATIVE
NITRITE UA: NEGATIVE
Specific Gravity, UA: 1.01 (ref 1.005–1.030)
UUROB: 0.2 mg/dL (ref 0.2–1.0)
pH, UA: 6 (ref 5.0–7.5)

## 2015-05-19 LAB — MICROSCOPIC EXAMINATION

## 2015-05-19 MED ORDER — NADOLOL 20 MG PO TABS: 10.0000 mg | ORAL_TABLET | Freq: Every day | ORAL | Status: DC

## 2015-05-19 MED ORDER — CIPROFLOXACIN HCL 500 MG PO TABS: 500.0000 mg | ORAL_TABLET | Freq: Two times a day (BID) | ORAL | Status: DC

## 2015-05-19 NOTE — Addendum Note (Signed)
Addended by: [name] A on: 05/19/2015 04:19 PM   Modules accepted: Orders

## 2015-05-19 NOTE — Telephone Encounter (Addendum)
.  Per Terri Setzer,NP patient to have labs in 1 week. 

## 2015-05-19 NOTE — Progress Notes (Signed)
   Subjective:    Patient ID: Kathryn [name], female    DOB: 14-Mar-1955, 60 y.o.   MRN: [id number]  HPI Pt presents to the office today for her weekly appt from a  hospital follow up for hyponatremia, weakness, CKD stage 3, and hypokalemia. Pt reports feeling weak and has no appetite. Pt had appt with GI since our last visit. Pt states she had blood work drawn was started on [employer] for rectal discharge. Pt has ileostomy  That she states is working well. PT is complaining of "spleen pain". PT states the pain is intermittent and left middle back and LUQ abd pain of a 10 out 10. PT states she has not tried anything. Pt states she is working with physical therapy and has appointment tomorrow.     Review of Systems  Constitutional: Negative.   HENT: Negative.   Eyes: Negative.   Respiratory: Negative.  Negative for shortness of breath.   Cardiovascular: Negative.  Negative for palpitations.  Gastrointestinal: Negative.   Endocrine: Negative.   Genitourinary: Negative.   Musculoskeletal: Negative.   Neurological: Negative.  Negative for headaches.  Hematological: Negative.   Psychiatric/Behavioral: Negative.   All other systems reviewed and are negative.      Objective:   Physical Exam  Constitutional: She is oriented to person, place, and time. She appears well-developed and well-nourished. No distress.  HENT:  Head: Normocephalic and atraumatic.  Eyes: Pupils are equal, round, and reactive to light.  Neck: Normal range of motion. Neck supple. No thyromegaly present.  Cardiovascular: Normal rate, regular rhythm, normal heart sounds and intact distal pulses.   No murmur heard. Pulmonary/Chest: Effort normal. No respiratory distress. She has no wheezes.  Diminished breath sounds   Abdominal: Soft. Bowel sounds are normal. She exhibits no distension. There is no tenderness.  Genitourinary:  Ostomy present   Musculoskeletal: Normal range of motion. She exhibits edema (2+ in BLE). She  exhibits no tenderness.  Neurological: She is alert and oriented to person, place, and time.  Skin: Skin is warm and dry.  Psychiatric: She has a normal mood and affect. Her behavior is normal. Judgment and thought content normal.  Vitals reviewed.     BP 80/56 mmHg  Pulse 61  Temp(Src) 97.5 F (36.4 C) (Oral)  Ht '5\' 5"'$  (1.651 m)  Wt 192 lb 6.4 oz (87.272 kg)  BMI 32.02 kg/m2      Assessment & Plan:  1. Hypotension, unspecified hypotension type -Nadolol decreased to 10 mg daily from 20 mg daily -Force fluids - BMP8+EGFR - CBC with Differential/Platelet - nadolol (CORGARD) 20 MG tablet; Take 0.5 tablets (10 mg total) by mouth daily.  Dispense: 45 tablet; Refill: 1  2. Other cirrhosis of liver (HCC) - BMP8+EGFR - Hepatic function panel  3. Hepatic encephalopathy (HCC) - BMP8+EGFR - Hepatic function panel - Ammonia  4. CKD (chronic kidney disease), stage III - BMP8+EGFR  5. Acute on chronic renal failure (HCC) - BMP8+EGFR  6. Hypokalemia - BMP8+EGFR  7. Hyponatremia - BMP8+EGFR  8. Weakness - BMP8+EGFR - CBC with Differential/Platelet - Urinalysis, Complete  9. LUQ pain - BMP8+EGFR - Hepatic function panel - CBC with Differential/Platelet - Ammonia - Urinalysis, Complete  10. Hospital discharge follow-up  Continue all meds Labs pending Health Maintenance reviewed Diet and exercise encouraged RTO 1 week  [name], FNP

## 2015-05-19 NOTE — Addendum Note (Signed)
Addended by: Shona Needles on: 05/19/2015 04:34 PM   Modules accepted: Orders

## 2015-05-19 NOTE — Patient Instructions (Addendum)

## 2015-05-20 ENCOUNTER — Ambulatory Visit (HOSPITAL_COMMUNITY): Payer: Medicare HMO | Admitting: Physical Therapy

## 2015-05-20 LAB — CBC WITH DIFFERENTIAL/PLATELET
BASOS: 0 %
Basophils Absolute: 0 10*3/uL (ref 0.0–0.2)
EOS (ABSOLUTE): 0.1 10*3/uL (ref 0.0–0.4)
Eos: 1 %
Hematocrit: 36.5 % (ref 34.0–46.6)
Hemoglobin: 11.9 g/dL (ref 11.1–15.9)
IMMATURE GRANULOCYTES: 1 %
Immature Grans (Abs): 0.1 10*3/uL (ref 0.0–0.1)
LYMPHS ABS: 3 10*3/uL (ref 0.7–3.1)
Lymphs: 25 %
MCH: 29.1 pg (ref 26.6–33.0)
MCHC: 32.6 g/dL (ref 31.5–35.7)
MCV: 89 fL (ref 79–97)
MONOS ABS: 0.8 10*3/uL (ref 0.1–0.9)
Monocytes: 7 %
NEUTROS PCT: 66 %
Neutrophils Absolute: 8.2 10*3/uL — ABNORMAL HIGH (ref 1.4–7.0)
PLATELETS: 116 10*3/uL — AB (ref 150–379)
RBC: 4.09 x10E6/uL (ref 3.77–5.28)
RDW: 15.9 % — AB (ref 12.3–15.4)
WBC: 12.1 10*3/uL — AB (ref 3.4–10.8)

## 2015-05-20 LAB — HEPATIC FUNCTION PANEL
ALBUMIN: 3.1 g/dL — AB (ref 3.5–5.5)
ALK PHOS: 222 IU/L — AB (ref 39–117)
ALT: 21 IU/L (ref 0–32)
AST: 31 IU/L (ref 0–40)
Bilirubin Total: 1.8 mg/dL — ABNORMAL HIGH (ref 0.0–1.2)
Bilirubin, Direct: 0.65 mg/dL — ABNORMAL HIGH (ref 0.00–0.40)
Total Protein: 5.6 g/dL — ABNORMAL LOW (ref 6.0–8.5)

## 2015-05-20 LAB — BMP8+EGFR
BUN / CREAT RATIO: 27 — AB (ref 9–23)
BUN: 68 mg/dL — ABNORMAL HIGH (ref 6–24)
CO2: 15 mmol/L — ABNORMAL LOW (ref 18–29)
CREATININE: 2.55 mg/dL — AB (ref 0.57–1.00)
Calcium: 8.9 mg/dL (ref 8.7–10.2)
Chloride: 96 mmol/L (ref 96–106)
GFR calc Af Amer: 23 mL/min/{1.73_m2} — ABNORMAL LOW (ref >59)
GFR, EST NON AFRICAN AMERICAN: 20 mL/min/{1.73_m2} — AB (ref >59)
Glucose: 110 mg/dL — ABNORMAL HIGH (ref 65–99)
Potassium: 3.3 mmol/L — ABNORMAL LOW (ref 3.5–5.2)
SODIUM: 126 mmol/L — AB (ref 134–144)

## 2015-05-20 LAB — AMMONIA: Ammonia: 94 ug/dL — ABNORMAL HIGH (ref 19–87)

## 2015-05-21 ENCOUNTER — Emergency Department (HOSPITAL_COMMUNITY): Payer: Medicare HMO

## 2015-05-21 ENCOUNTER — Inpatient Hospital Stay (HOSPITAL_COMMUNITY)
Admission: EM | Admit: 2015-05-21 | Discharge: 2015-05-24 | DRG: 683 | Disposition: A | Discharge status: Home / Self Care | Payer: Medicare HMO | Attending: Internal Medicine | Admitting: Internal Medicine

## 2015-05-21 ENCOUNTER — Other Ambulatory Visit: Payer: Self-pay | Admitting: Family

## 2015-05-21 ENCOUNTER — Encounter (HOSPITAL_COMMUNITY): Payer: Self-pay

## 2015-05-21 DIAGNOSIS — Z66 Do not resuscitate: Secondary | ICD-10-CM | POA: Diagnosis present

## 2015-05-21 DIAGNOSIS — R531 Weakness: Secondary | ICD-10-CM | POA: Diagnosis not present

## 2015-05-21 DIAGNOSIS — K7460 Unspecified cirrhosis of liver: Secondary | ICD-10-CM | POA: Diagnosis present

## 2015-05-21 DIAGNOSIS — N179 Acute kidney failure, unspecified: Principal | ICD-10-CM | POA: Diagnosis present

## 2015-05-21 DIAGNOSIS — R109 Unspecified abdominal pain: Secondary | ICD-10-CM

## 2015-05-21 DIAGNOSIS — E871 Hypo-osmolality and hyponatremia: Secondary | ICD-10-CM | POA: Diagnosis present

## 2015-05-21 DIAGNOSIS — N189 Chronic kidney disease, unspecified: Secondary | ICD-10-CM | POA: Diagnosis present

## 2015-05-21 DIAGNOSIS — B1920 Unspecified viral hepatitis C without hepatic coma: Secondary | ICD-10-CM | POA: Diagnosis present

## 2015-05-21 DIAGNOSIS — E876 Hypokalemia: Secondary | ICD-10-CM | POA: Diagnosis present

## 2015-05-21 DIAGNOSIS — I129 Hypertensive chronic kidney disease with stage 1 through stage 4 chronic kidney disease, or unspecified chronic kidney disease: Secondary | ICD-10-CM | POA: Diagnosis present

## 2015-05-21 DIAGNOSIS — N390 Urinary tract infection, site not specified: Secondary | ICD-10-CM

## 2015-05-21 DIAGNOSIS — D6959 Other secondary thrombocytopenia: Secondary | ICD-10-CM | POA: Diagnosis present

## 2015-05-21 LAB — CBC WITH DIFFERENTIAL/PLATELET
BASOS PCT: 0 %
Basophils Absolute: 0 10*3/uL (ref 0.0–0.1)
EOS ABS: 0.1 10*3/uL (ref 0.0–0.7)
Eosinophils Relative: 1 %
HCT: 31.2 % — ABNORMAL LOW (ref 36.0–46.0)
Hemoglobin: 11.1 g/dL — ABNORMAL LOW (ref 12.0–15.0)
Lymphocytes Relative: 24 %
Lymphs Abs: 2.2 10*3/uL (ref 0.7–4.0)
MCH: 30.5 pg (ref 26.0–34.0)
MCHC: 35.6 g/dL (ref 30.0–36.0)
MCV: 85.7 fL (ref 78.0–100.0)
MONO ABS: 0.6 10*3/uL (ref 0.1–1.0)
MONOS PCT: 7 %
Neutro Abs: 6.3 10*3/uL (ref 1.7–7.7)
Neutrophils Relative %: 68 %
PLATELETS: 90 10*3/uL — AB (ref 150–400)
RBC: 3.64 MIL/uL — ABNORMAL LOW (ref 3.87–5.11)
RDW: 15 % (ref 11.5–15.5)
WBC: 9.3 10*3/uL (ref 4.0–10.5)

## 2015-05-21 LAB — COMPREHENSIVE METABOLIC PANEL
ALBUMIN: 2.6 g/dL — AB (ref 3.5–5.0)
ALT: 20 U/L (ref 14–54)
ANION GAP: 6 (ref 5–15)
AST: 26 U/L (ref 15–41)
Alkaline Phosphatase: 155 U/L — ABNORMAL HIGH (ref 38–126)
BILIRUBIN TOTAL: 2 mg/dL — AB (ref 0.3–1.2)
BUN: 73 mg/dL — AB (ref 6–20)
CALCIUM: 8.2 mg/dL — AB (ref 8.9–10.3)
CO2: 15 mmol/L — ABNORMAL LOW (ref 22–32)
CREATININE: 2.26 mg/dL — AB (ref 0.44–1.00)
Chloride: 102 mmol/L (ref 101–111)
GFR calc Af Amer: 26 mL/min — ABNORMAL LOW (ref >60)
GFR, EST NON AFRICAN AMERICAN: 23 mL/min — AB (ref >60)
GLUCOSE: 189 mg/dL — AB (ref 65–99)
POTASSIUM: 2.7 mmol/L — AB (ref 3.5–5.1)
Sodium: 123 mmol/L — ABNORMAL LOW (ref 135–145)
TOTAL PROTEIN: 5.2 g/dL — AB (ref 6.5–8.1)

## 2015-05-21 LAB — PROTIME-INR
INR: 1.48 (ref 0.00–1.49)
Prothrombin Time: 18 seconds — ABNORMAL HIGH (ref 11.6–15.2)

## 2015-05-21 LAB — URINE MICROSCOPIC-ADD ON

## 2015-05-21 LAB — URINALYSIS, ROUTINE W REFLEX MICROSCOPIC
BILIRUBIN URINE: NEGATIVE
GLUCOSE, UA: NEGATIVE mg/dL
KETONES UR: NEGATIVE mg/dL
NITRITE: NEGATIVE
PH: 6 (ref 5.0–8.0)
PROTEIN: NEGATIVE mg/dL
Specific Gravity, Urine: 1.01 (ref 1.005–1.030)

## 2015-05-21 LAB — AMMONIA: AMMONIA: 54 umol/L — AB (ref 9–35)

## 2015-05-21 LAB — LIPASE, BLOOD: Lipase: 114 U/L — ABNORMAL HIGH (ref 11–51)

## 2015-05-21 LAB — MRSA PCR SCREENING: MRSA by PCR: NEGATIVE

## 2015-05-21 MED ORDER — DIATRIZOATE MEGLUMINE & SODIUM 66-10 % PO SOLN
ORAL | Status: AC
  Administered 2015-05-21: 30 mL
  Filled 2015-05-21: qty 30

## 2015-05-21 MED ORDER — POTASSIUM CHLORIDE 10 MEQ/100ML IV SOLN
10.0000 meq | INTRAVENOUS | Status: AC
  Administered 2015-05-21 (×3): 10 meq via INTRAVENOUS
  Filled 2015-05-21 (×3): qty 100

## 2015-05-21 MED ORDER — ONDANSETRON HCL 4 MG/2ML IJ SOLN: 4.0000 mg | Freq: Once | INTRAMUSCULAR | Status: DC

## 2015-05-21 MED ORDER — SODIUM CHLORIDE 0.9 % IV BOLUS (SEPSIS)
250.0000 mL | Freq: Once | INTRAVENOUS | Status: AC
Start: 2015-05-21 — End: 2015-05-21
  Administered 2015-05-21: 250 mL via INTRAVENOUS

## 2015-05-21 MED ORDER — ALPRAZOLAM 1 MG PO TABS
1.0000 mg | ORAL_TABLET | Freq: Every evening | ORAL | Status: DC | PRN
  Administered 2015-05-21 – 2015-05-23 (×2): 1 mg via ORAL
  Filled 2015-05-21 (×2): qty 1

## 2015-05-21 MED ORDER — SODIUM CHLORIDE 0.9 % IV SOLN
INTRAVENOUS | Status: DC
  Administered 2015-05-21: 14:00:00 via INTRAVENOUS

## 2015-05-21 MED ORDER — ONDANSETRON HCL 4 MG PO TABS: 4.0000 mg | ORAL_TABLET | Freq: Four times a day (QID) | ORAL | Status: DC | PRN

## 2015-05-21 MED ORDER — POTASSIUM CHLORIDE CRYS ER 20 MEQ PO TBCR
40.0000 meq | EXTENDED_RELEASE_TABLET | Freq: Once | ORAL | Status: AC
  Administered 2015-05-21: 40 meq via ORAL
  Filled 2015-05-21: qty 2

## 2015-05-21 MED ORDER — LACTULOSE 10 GM/15ML PO SOLN
20.0000 g | Freq: Two times a day (BID) | ORAL | Status: DC
  Administered 2015-05-21 – 2015-05-24 (×6): 20 g via ORAL
  Filled 2015-05-21 (×6): qty 30

## 2015-05-21 MED ORDER — ACETAMINOPHEN 325 MG PO TABS: 650.0000 mg | ORAL_TABLET | Freq: Four times a day (QID) | ORAL | Status: DC | PRN

## 2015-05-21 MED ORDER — MESALAMINE 1000 MG RE SUPP
1000.0000 mg | Freq: Every day | RECTAL | Status: DC
  Administered 2015-05-21 – 2015-05-23 (×3): 1000 mg via RECTAL
  Filled 2015-05-21 (×4): qty 1

## 2015-05-21 MED ORDER — ACETAMINOPHEN 650 MG RE SUPP: 650.0000 mg | Freq: Four times a day (QID) | RECTAL | Status: DC | PRN

## 2015-05-21 MED ORDER — ONDANSETRON HCL 4 MG/2ML IJ SOLN
4.0000 mg | Freq: Once | INTRAMUSCULAR | Status: AC
  Administered 2015-05-21: 4 mg via INTRAVENOUS
  Filled 2015-05-21: qty 2

## 2015-05-21 MED ORDER — CIPROFLOXACIN IN D5W 400 MG/200ML IV SOLN
400.0000 mg | Freq: Once | INTRAVENOUS | Status: AC
  Administered 2015-05-21: 400 mg via INTRAVENOUS
  Filled 2015-05-21: qty 200

## 2015-05-21 MED ORDER — PANTOPRAZOLE SODIUM 40 MG PO TBEC
40.0000 mg | DELAYED_RELEASE_TABLET | Freq: Every day | ORAL | Status: DC
  Administered 2015-05-21 – 2015-05-24 (×4): 40 mg via ORAL
  Filled 2015-05-21 (×4): qty 1

## 2015-05-21 MED ORDER — GABAPENTIN 100 MG PO CAPS
100.0000 mg | ORAL_CAPSULE | Freq: Three times a day (TID) | ORAL | Status: DC
  Administered 2015-05-21 – 2015-05-24 (×8): 100 mg via ORAL
  Filled 2015-05-21 (×8): qty 1

## 2015-05-21 MED ORDER — CIPROFLOXACIN IN D5W 400 MG/200ML IV SOLN
400.0000 mg | Freq: Two times a day (BID) | INTRAVENOUS | Status: DC
  Administered 2015-05-22 – 2015-05-23 (×4): 400 mg via INTRAVENOUS
  Filled 2015-05-21 (×5): qty 200

## 2015-05-21 MED ORDER — POTASSIUM CHLORIDE ER 10 MEQ PO TBCR: 10.0000 meq | EXTENDED_RELEASE_TABLET | Freq: Two times a day (BID) | ORAL | Status: DC

## 2015-05-21 MED ORDER — POTASSIUM CHLORIDE 10 MEQ/100ML IV SOLN
10.0000 meq | Freq: Once | INTRAVENOUS | Status: AC
  Administered 2015-05-21: 10 meq via INTRAVENOUS
  Filled 2015-05-21: qty 100

## 2015-05-21 MED ORDER — SODIUM CHLORIDE 0.9 % IV SOLN
INTRAVENOUS | Status: AC
  Administered 2015-05-21: 18:00:00 via INTRAVENOUS

## 2015-05-21 MED ORDER — ONDANSETRON HCL 4 MG/2ML IJ SOLN: 4.0000 mg | Freq: Four times a day (QID) | INTRAMUSCULAR | Status: DC | PRN

## 2015-05-21 MED ORDER — SODIUM CHLORIDE 0.9% FLUSH
3.0000 mL | Freq: Two times a day (BID) | INTRAVENOUS | Status: DC
  Administered 2015-05-21 – 2015-05-23 (×4): 3 mL via INTRAVENOUS

## 2015-05-21 NOTE — H&P (Addendum)
PCP:   [name], FNP   Chief Complaint:  Generalized weakness  HPI: 60 year-old female who  has a past medical history of Hypertension; Enteritis presumed infectious; Crohn's; Anemia; Hepatitis C antibody test positive; Hepatitis; Renal insufficiency; Cirrhosis (HCC); Splenomegaly; Bilateral leg edema (01/2014); Chronic back pain; and Thrombocytopenia (HCC). Today presents to the hospital with generalized weakness. 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 She was recently seen by her primary care provider where she had lab work which revealed hyponatremia. Today in the ED lab work showed sodium 123, elevated BUN/creatinine 73/2.26, her baseline BUN/creatinine of 40/1.79. Patient also found to have hypokalemia with potassium of 2.7 She has not been eating and drinking over the past few days. Though she denies nausea or vomiting. Patient has ileostomy bag in place, patient has history of Crohn disease.  Allergies:   Allergies  Allergen Reactions  . Penicillins Rash    Has patient had a PCN reaction causing immediate rash, facial/tongue/throat swelling, SOB or lightheadedness with hypotension: Yes Has patient had a PCN reaction causing severe rash involving mucus membranes or skin necrosis: No Has patient had a PCN reaction that required hospitalization No Has patient had a PCN reaction occurring within the last 10 years: No If all of the above answers are "NO", then may proceed with Cephalosporin use.       Past Medical History  Diagnosis Date  . Hypertension   . Enteritis presumed infectious   . Crohn's   . Anemia   . Hepatitis C antibody test positive   . Hepatitis   . Renal insufficiency   . Cirrhosis (HCC)   . Splenomegaly   . Bilateral leg edema 01/2014  . Chronic back pain   . Thrombocytopenia [facility])     Past Surgical History  Procedure Laterality Date  . Colon surgery      MULTIPLE SURGERIES FOR CROHNS  . Ileostomy      multiple abdominal  surgeries for Crohn's by Dr. Gabriel Cirri  . Cholecystectomy    . Abscess drainage      abdominal  . Esophagogastroduodenoscopy  10/05/2010    Procedure: ESOPHAGOGASTRODUODENOSCOPY (EGD);  Surgeon: [name], MD;  [facility];  Service: Endoscopy;  Laterality: N/A;  7:30 am  . Cirrhosis    . Esophagogastroduodenoscopy N/A 09/11/2013    Procedure: ESOPHAGOGASTRODUODENOSCOPY (EGD);  Surgeon: [name], MD;  [facility];  Service: Endoscopy;  Laterality: N/A;  . Ileoscopy  09/11/2013    Procedure: ILEOSCOPY THROUGH STOMA;  Surgeon: [name], MD;  [facility];  Service: Endoscopy;;  . Givens capsule study N/A 09/12/2013    Procedure: GIVENS CAPSULE STUDY;  Surgeon: [name], MD;  [facility];  Service: Endoscopy;  Laterality: N/A;  . Givens capsule study N/A 11/25/2013    Procedure: GIVENS CAPSULE STUDY;  Surgeon: [name], MD;  [facility];  Service: Endoscopy;  Laterality: N/A;  . Varices cauterization Right     in the stoma of her ileostomy  . Tips procedure  11/2013    [facility]     Prior to Admission medications   Medication Sig Start Date End Date Taking? Authorizing Provider  ALPRAZolam Prudy Feeler) 1 MG tablet TAKE 1 TABLET BY MOUTH AT BEDTIME AS NEEDED 05/05/15  Yes Junie Spencer, FNP  Cholecalciferol (VITAMIN D-1000 MAX ST) 1000 UNITS tablet Take 1,000 Units by mouth daily.   Yes Historical Provider, MD  ciprofloxacin (  CIPRO) 500 MG tablet Take 1 tablet (500 mg total) by mouth 2 (two) times daily. 05/19/15  Yes Junie Spencer, FNP  feeding supplement, ENSURE ENLIVE, (ENSURE ENLIVE) LIQD Take 237 mLs by mouth daily. 03/27/15  Yes Standley Brooking, MD  furosemide (LASIX) 20 MG tablet Take 1 tablet (20 mg total) by mouth daily. 04/22/15  Yes Erick Blinks, MD  gabapentin (NEURONTIN) 100 MG capsule Take 1 capsule (100 mg total) by mouth 3 (three) times daily. 04/23/15  Yes Ernestina Penna, MD  lactulose (CHRONULAC)  10 GM/15ML solution Take 30 mLs (20 g total) by mouth 2 (two) times daily. 03/27/15  Yes Standley Brooking, MD  mesalamine (CANASA) 1000 MG suppository Place 1 suppository (1,000 mg total) rectally at bedtime. 04/22/15  Yes Erick Blinks, MD  Multiple Vitamins-Minerals (MULTIVITAMIN WITH MINERALS) tablet Take 1 tablet by mouth daily.   Yes Historical Provider, MD  nadolol (CORGARD) 20 MG tablet Take 0.5 tablets (10 mg total) by mouth daily. 05/19/15  Yes Junie Spencer, FNP  pantoprazole (PROTONIX) 40 MG tablet TAKE 1 TABLET BY MOUTH DAILY 06/09/14  Yes Len Blalock, NP  potassium chloride (K-DUR) 10 MEQ tablet Take 1 tablet (10 mEq total) by mouth 2 (two) times daily. 05/21/15  Yes Junie Spencer, FNP  Probiotic Product (PROBIOTIC PO) Take 1 tablet by mouth daily.   Yes Historical Provider, MD  spironolactone (ALDACTONE) 25 MG tablet Take 1 tablet (25 mg total) by mouth daily. 04/22/15  Yes Erick Blinks, MD    Social History:  reports that she has never smoked. She has never used smokeless tobacco. She reports that she does not drink alcohol or use illicit drugs.  Family History  Problem Relation Age of Onset  . Cancer Mother   . Stroke Father     There were no vitals filed for this visit.  All the positives are listed in BOLD  Review of Systems:  Generalized weakness, poor by mouth intake HEENT: Headache, blurred vision, runny nose, sore throat Neck: Hypothyroidism, hyperthyroidism,,lymphadenopathy Chest : Shortness of breath, history of COPD, Asthma Heart : Chest pain, history of coronary arterey disease GI:  Nausea, vomiting, diarrhea, constipation, GERD GU: Dysuria, urgency, frequency of urination, hematuria Neuro: Stroke, seizures, syncope Psych: Depression, anxiety, hallucinations   Physical Exam: Blood pressure 97/66, pulse 58, temperature 98 F (36.7 C), temperature source Oral, resp. rate 17, SpO2 100 %. Constitutional:   Patient is a well-developed and well-nourished  female* in no acute distress and cooperative with exam. Head: Normocephalic and atraumatic Mouth: Mucus membranes moist Eyes: PERRL, EOMI, conjunctivae normal Neck: Supple, No Thyromegaly Cardiovascular: RRR, S1 normal, S2 normal Pulmonary/Chest: CTAB, no wheezes, rales, or rhonchi Abdominal: Soft. Non-tender, non-distended, bowel sounds are normal, no masses, organomegaly, or guarding present. Ileostomy bag in place  Neurological: A&O x3, Strength is normal and symmetric bilaterally, cranial nerve II-XII are grossly intact, no focal motor deficit, sensory intact to light touch bilaterally.  Extremities : No Cyanosis, Clubbing. Bilateral 1+ pitting edema  Labs on Admission:  Basic Metabolic Panel:  Recent Labs Lab 05/19/15 1447 05/21/15 1305  NA 126* 123*  K 3.3* 2.7*  CL 96 102  CO2 15* 15*  GLUCOSE 110* 189*  BUN 68* 73*  CREATININE 2.55* 2.26*  CALCIUM 8.9 8.2*   Liver Function Tests:  Recent Labs Lab 05/19/15 1447 05/21/15 1305  AST 31 26  ALT 21 20  ALKPHOS 222* 155*  BILITOT 1.8* 2.0*  PROT 5.6* 5.2*  ALBUMIN  3.1* 2.6*    Recent Labs Lab 05/21/15 1305  LIPASE 114*    Recent Labs Lab 05/19/15 1447 05/21/15 1305  AMMONIA 94* 54*   CBC:  Recent Labs Lab 05/19/15 1447 05/21/15 1305  WBC 12.1* 9.3  NEUTROABS 8.2* 6.3  HGB  --  11.1*  HCT 36.5 31.2*  MCV 89 85.7  PLT 116* 90*    BNP (last 3 results)  Recent Labs  03/26/15 0345  BNP 74.0     Radiological Exams on Admission: Ct Abdomen Pelvis Wo Contrast  05/21/2015  CLINICAL DATA:  Left-sided flank pain and generalized weakness. Cirrhosis. Stage 3 kidney disease. EXAM: CT ABDOMEN AND PELVIS WITHOUT CONTRAST TECHNIQUE: Multidetector CT imaging of the abdomen and pelvis was performed following the standard protocol without IV contrast. COMPARISON:  03/10/2015 FINDINGS: Lower chest: The lung bases are clear. No pleural or pericardial effusion noted. Hepatobiliary: A TIPS shunt is identified.  Morphologic features of liver compatible with cirrhosis noted. No focal liver abnormality identified within the limitations of noncontrast thickening. Previous cholecystectomy. No biliary dilatation. Pancreas: No mass or inflammatory process identified on this un-enhanced exam. Spleen: The spleen is enlarged measuring 14 cm in length. Adrenals/Urinary Tract: Normal adrenal glands. No focal kidney abnormality identified. Urinary bladder appears normal for degree of distention. Stomach/Bowel: Normal appearance of the stomach. Right lower quadrant ileostomy is identified. Enteric contrast material is identified throughout the small bowel loops up to the level of the ileostomy. Again noted is a ventral abdominal wall hernia which contains a loop of small bowel. This does not scratch set there does not appear to be a high-grade obstruction associated with this hernia at this time. Hartman's pouch is unremarkable. Vascular/Lymphatic: Normal appearance of the abdominal aorta. No enlarged retroperitoneal or mesenteric adenopathy. No enlarged pelvic or inguinal lymph nodes. Reproductive: No mass or other significant abnormality. Other: None. Musculoskeletal: Spondylosis noted within the lower thoracic and lumbar spine. IMPRESSION: 1. No acute findings and no explanation for patient's left flank pain. No kidney stones or ureteral calculi identified at this time. 2. Ventral abdominal wall hernia is again noted containing a nonobstructed loop of small bowel. 3. Right lower quadrant ileostomy. 4. 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 Electronically Signed   By: Signa Kell M.D.   On: 05/21/2015 17:17   Dg Chest 2 View  05/21/2015  CLINICAL DATA:  Weakness, cirrhosis, kidney disease EXAM: CHEST  2 VIEW COMPARISON:  04/16/2015 FINDINGS: Cardiomediastinal silhouette is stable. No infiltrate or pleural effusion. No pulmonary edema. Bony thorax is unremarkable. IMPRESSION: No active cardiopulmonary disease. Electronically Signed    By: Natasha Mead M.D.   On: 05/21/2015 13:42    EKG: Independently reviewed. Normal sinus rhythm   Assessment/Plan Active Problems:   Hyponatremia   Hypokalemia   AKI (acute kidney injury) (HCC)   liver cirrhosis from hep C  Hyponatremia Patient presenting with generalized weakness with sodium 123, will admit to the hospital for observation. Check serum and urine osmolality. Will put on fluid restriction 1200 mL daily by mouth  AKI on CKD Patient has elevated BUN/creatinine 73/2.26, her baseline creatinine as of March 27 was 40/1.79. Will hold Lasix and Spirono lactone at this time. Start gentle IV hydration with normal saline  ?UTI   patient has mildly abnormal UA, will start ciprofloxacin.  Follow urine culture results.    Hypokalemia Replace potassium and check BMP in a.m.  Liver cirrhosis  Patient has history of liver cirrhosis, hepatitis C Ammonia level 54, patient not  in encephalopathy Hold diuretics at this time due to above.  Thrombocytopenia Platelet count of 90, secondary to liver cirrhosis  DVT prophylaxis SCDs  Code status: DO NOT RESUSCITATE  Family discussion: Admission, patients condition and plan of care including tests being ordered have been discussed with the patient and her husband at bedside who indicate understanding and agree with the plan and Code Status.   Time Spent on Admission: 60 min  Bitania Shankland S [facility] Pager: [phone] 05/21/2015, 6:00 PM  If 7PM-7AM, please contact night-coverage  [email or web address]  Password [email or web address]

## 2015-05-21 NOTE — ED Provider Notes (Signed)
CSN: [id number]     Arrival date & time 05/21/15  1208 History   First MD Initiated Contact with Patient 05/21/15 1238     Chief Complaint  Patient presents with  . Back Pain     (Consider location/radiation/quality/duration/timing/severity/associated sxs/prior Treatment) Patient is a 60 y.o. female presenting with back pain. The history is provided by the patient.  Back Pain Associated symptoms: dysuria   Associated symptoms: no abdominal pain, no chest pain and no weakness    [facility]. Patient with known history of cirrhosis due to hepatitis C renal insufficiency. In addition chronic back pain. Patient presents with a complaint of generalized weakness and back pain and left flank pain concerned that she has a urinary tract infection. Also dysuria for a week. Seen by primary care recently was not started on any antibiotics.  Past Medical History  Diagnosis Date  . Hypertension   . Enteritis presumed infectious   . Crohn's   . Anemia   . Hepatitis C antibody test positive   . Hepatitis   . Renal insufficiency   . Cirrhosis (HCC)   . Splenomegaly   . Bilateral leg edema 01/2014  . Chronic back pain   . Thrombocytopenia [facility])    Past Surgical History  Procedure Laterality Date  . Colon surgery      MULTIPLE SURGERIES FOR CROHNS  . Ileostomy      multiple abdominal surgeries for Crohn's by Dr. Gabriel Cirri  . Cholecystectomy    . Abscess drainage      abdominal  . Esophagogastroduodenoscopy  10/05/2010    Procedure: ESOPHAGOGASTRODUODENOSCOPY (EGD);  Surgeon: [name], MD;  [facility];  Service: Endoscopy;  Laterality: N/A;  7:30 am  . Cirrhosis    . Esophagogastroduodenoscopy N/A 09/11/2013    Procedure: ESOPHAGOGASTRODUODENOSCOPY (EGD);  Surgeon: [name], MD;  [facility];  Service: Endoscopy;  Laterality: N/A;  . Ileoscopy  09/11/2013    Procedure: ILEOSCOPY THROUGH STOMA;  Surgeon: [name], MD;  [facility];  Service: Endoscopy;;  . Givens capsule study N/A 09/12/2013    Procedure: GIVENS CAPSULE STUDY;  Surgeon: [name], MD;  [facility];  Service: Endoscopy;  Laterality: N/A;  . Givens capsule study N/A 11/25/2013    Procedure: GIVENS CAPSULE STUDY;  Surgeon: [name], MD;  [facility];  Service: Endoscopy;  Laterality: N/A;  . Varices cauterization Right     in the stoma of her ileostomy  . Tips procedure  11/2013    [facility]    Family History  Problem Relation Age of Onset  . Cancer Mother   . Stroke Father    Social History  Substance Use Topics  . Smoking status: Never Smoker   . Smokeless tobacco: Never Used  . Alcohol Use: No   OB History    Gravida Para Term Preterm AB TAB SAB Ectopic Multiple Living            1     Review of Systems  Constitutional: Positive for fatigue.  HENT: Negative for congestion.   Eyes: Negative for visual disturbance.  Respiratory: Negative for shortness of breath.   Cardiovascular: Negative for chest pain.  Gastrointestinal: Positive for nausea. Negative for vomiting and abdominal pain.  Genitourinary: Positive for dysuria. Negative for hematuria.  Musculoskeletal: Positive for back pain.  Skin: Negative for rash.  Neurological: Negative for weakness.  Hematological: Does  not bruise/bleed easily.  Psychiatric/Behavioral: Negative for confusion.      Allergies  Penicillins  Home Medications   Prior to Admission medications   Medication Sig Start Date End Date Taking? Authorizing Provider  ALPRAZolam Prudy Feeler) 1 MG tablet TAKE 1 TABLET BY MOUTH AT BEDTIME AS NEEDED 05/05/15  Yes Junie Spencer, FNP  Cholecalciferol (VITAMIN D-1000 MAX ST) 1000 UNITS tablet Take 1,000 Units by mouth daily.   Yes Historical Provider, MD  ciprofloxacin (CIPRO) 500 MG tablet Take 1 tablet (500 mg total) by mouth 2 (two) times daily. 05/19/15  Yes Junie Spencer, FNP  feeding supplement,  ENSURE ENLIVE, (ENSURE ENLIVE) LIQD Take 237 mLs by mouth daily. 03/27/15  Yes Standley Brooking, MD  furosemide (LASIX) 20 MG tablet Take 1 tablet (20 mg total) by mouth daily. 04/22/15  Yes Erick Blinks, MD  gabapentin (NEURONTIN) 100 MG capsule Take 1 capsule (100 mg total) by mouth 3 (three) times daily. 04/23/15  Yes Ernestina Penna, MD  lactulose (CHRONULAC) 10 GM/15ML solution Take 30 mLs (20 g total) by mouth 2 (two) times daily. 03/27/15  Yes Standley Brooking, MD  mesalamine (CANASA) 1000 MG suppository Place 1 suppository (1,000 mg total) rectally at bedtime. 04/22/15  Yes Erick Blinks, MD  Multiple Vitamins-Minerals (MULTIVITAMIN WITH MINERALS) tablet Take 1 tablet by mouth daily.   Yes Historical Provider, MD  nadolol (CORGARD) 20 MG tablet Take 0.5 tablets (10 mg total) by mouth daily. 05/19/15  Yes Junie Spencer, FNP  pantoprazole (PROTONIX) 40 MG tablet TAKE 1 TABLET BY MOUTH DAILY 06/09/14  Yes Len Blalock, NP  potassium chloride (K-DUR) 10 MEQ tablet Take 1 tablet (10 mEq total) by mouth 2 (two) times daily. 05/21/15  Yes Junie Spencer, FNP  Probiotic Product (PROBIOTIC PO) Take 1 tablet by mouth daily.   Yes Historical Provider, MD  spironolactone (ALDACTONE) 25 MG tablet Take 1 tablet (25 mg total) by mouth daily. 04/22/15  Yes Erick Blinks, MD   BP 97/66 mmHg  Pulse 58  Temp(Src) 98 F (36.7 C) (Oral)  Resp 17  SpO2 100% Physical Exam  Constitutional: She is oriented to person, place, and time. She appears well-developed and well-nourished. No distress.  HENT:  Head: Normocephalic and atraumatic.  Mouth/Throat: Oropharynx is clear and moist.  Eyes: Conjunctivae and EOM are normal. Pupils are equal, round, and reactive to light.  Neck: Normal range of motion. Neck supple.  Cardiovascular: Normal rate, regular rhythm and normal heart sounds.   No murmur heard. Pulmonary/Chest: Effort normal and breath sounds normal. No respiratory distress.  Abdominal: Bowel sounds  are normal. There is no tenderness.  Musculoskeletal: Normal range of motion.  Neurological: She is alert and oriented to person, place, and time. No cranial nerve deficit. She exhibits normal muscle tone. Coordination normal.  Skin: Skin is warm. No rash noted.  Nursing note and vitals reviewed.   ED Course  Procedures (including critical care time) Labs Review Labs Reviewed  URINALYSIS, ROUTINE W REFLEX MICROSCOPIC (NOT AT [facility]) - Abnormal; Notable for the following:    Hgb urine dipstick MODERATE (*)    Leukocytes, UA LARGE (*)    All other components within normal limits  COMPREHENSIVE METABOLIC PANEL - Abnormal; Notable for the following:    Sodium 123 (*)    Potassium 2.7 (*)    CO2 15 (*)    Glucose, Bld 189 (*)    BUN 73 (*)    Creatinine, Ser 2.26 (*)  Calcium 8.2 (*)    Total Protein 5.2 (*)    Albumin 2.6 (*)    Alkaline Phosphatase 155 (*)    Total Bilirubin 2.0 (*)    GFR calc non Af Amer 23 (*)    GFR calc Af Amer 26 (*)    All other components within normal limits  LIPASE, BLOOD - Abnormal; Notable for the following:    Lipase 114 (*)    All other components within normal limits  CBC WITH DIFFERENTIAL/PLATELET - Abnormal; Notable for the following:    RBC 3.64 (*)    Hemoglobin 11.1 (*)    HCT 31.2 (*)    Platelets 90 (*)    All other components within normal limits  PROTIME-INR - Abnormal; Notable for the following:    Prothrombin Time 18.0 (*)    All other components within normal limits  AMMONIA - Abnormal; Notable for the following:    Ammonia 54 (*)    All other components within normal limits  URINE MICROSCOPIC-ADD ON - Abnormal; Notable for the following:    Squamous Epithelial / LPF 6-30 (*)    Bacteria, UA FEW (*)    All other components within normal limits  URINE CULTURE    Imaging Review Ct Abdomen Pelvis Wo Contrast  05/21/2015  CLINICAL DATA:  Left-sided flank pain and generalized weakness. Cirrhosis. Stage 3 kidney disease. EXAM:  CT ABDOMEN AND PELVIS WITHOUT CONTRAST TECHNIQUE: Multidetector CT imaging of the abdomen and pelvis was performed following the standard protocol without IV contrast. COMPARISON:  03/10/2015 FINDINGS: Lower chest: The lung bases are clear. No pleural or pericardial effusion noted. Hepatobiliary: A TIPS shunt is identified. Morphologic features of liver compatible with cirrhosis noted. No focal liver abnormality identified within the limitations of noncontrast thickening. Previous cholecystectomy. No biliary dilatation. Pancreas: No mass or inflammatory process identified on this un-enhanced exam. Spleen: The spleen is enlarged measuring 14 cm in length. Adrenals/Urinary Tract: Normal adrenal glands. No focal kidney abnormality identified. Urinary bladder appears normal for degree of distention. Stomach/Bowel: Normal appearance of the stomach. Right lower quadrant ileostomy is identified. Enteric contrast material is identified throughout the small bowel loops up to the level of the ileostomy. Again noted is a ventral abdominal wall hernia which contains a loop of small bowel. This does not scratch set there does not appear to be a high-grade obstruction associated with this hernia at this time. Hartman's pouch is unremarkable. Vascular/Lymphatic: Normal appearance of the abdominal aorta. No enlarged retroperitoneal or mesenteric adenopathy. No enlarged pelvic or inguinal lymph nodes. Reproductive: No mass or other significant abnormality. Other: None. Musculoskeletal: Spondylosis noted within the lower thoracic and lumbar spine. IMPRESSION: 1. No acute findings and no explanation for patient's left flank pain. No kidney stones or ureteral calculi identified at this time. 2. Ventral abdominal wall hernia is again noted containing a nonobstructed loop of small bowel. 3. Right lower quadrant ileostomy. 4. 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 Electronically Signed   By: Signa Kell M.D.   On: 05/21/2015 17:17   Dg  Chest 2 View  05/21/2015  CLINICAL DATA:  Weakness, cirrhosis, kidney disease EXAM: CHEST  2 VIEW COMPARISON:  04/16/2015 FINDINGS: Cardiomediastinal silhouette is stable. No infiltrate or pleural effusion. No pulmonary edema. Bony thorax is unremarkable. IMPRESSION: No active cardiopulmonary disease. Electronically Signed   By: Natasha Mead M.D.   On: 05/21/2015 13:42   I have personally reviewed and evaluated these images and lab results as part of my medical  decision-making.   EKG Interpretation   Date/Time:  Thursday May 21 2015 13:11:26 EDT Ventricular Rate:  58 PR Interval:  155 QRS Duration: 95 QT Interval:  450 QTC Calculation: 442 R Axis:   56 Text Interpretation:  Sinus rhythm Low voltage, precordial leads No  significant change since last tracing Confirmed by Hargun Spurling  MD, Rodgers Likes  [phone]) on 05/21/2015 1:19:20 PM      MDM   Final diagnoses:  UTI (lower urinary tract infection)  Hyponatremia  Hypokalemia    The patient's workup shows evidence of urinary tract infection. Treated with Cipro and urine culture sent. Also worsening hyponatremia and hypokalemia. Patient received 10 mEq of potassium here IV and also 40 mEq by mouth. Patient continued to receive normal saline for the hyponatremia. Rest of workup without any significant findings. Chest x-rays negative for any evidence of pneumonia pneumothorax or pulmonary edema. EKG without any acute findings. CT of the abdomen showed evidence of a ventral hernia but no evidence of any obstruction. Patient has a known history of cirrhosis. Ammonia level is actually lower than usual. Patient will require admission for the hyponatremia and the hypokalemia. In addition patient will require continued treatment for the urinary tract infection. No evidence of sepsis.    Vanetta Mulders, MD 05/21/15 1750

## 2015-05-21 NOTE — ED Notes (Signed)
CRITICAL VALUE ALERT  Critical value received:  Potassium 2.7   Date of notification:  05/21/15  Time of notification:  1356  Critical value read back:Yes.    Nurse who received alert:  Viviano Simas, RN  MD notified (1st page):  [facility]

## 2015-05-21 NOTE — ED Notes (Signed)
Pt says she has cirrhosis, stage III kidney disease, and swollen spleen.  Pt says hasn't felt good for the past couple of weeks.  C/O generalized weakness and back pain.  Reports pain is mostly in left flank area. Pt reports burning with urination x 1 week.

## 2015-05-21 NOTE — ED Notes (Signed)
MD at bedside. 

## 2015-05-22 DIAGNOSIS — E876 Hypokalemia: Secondary | ICD-10-CM | POA: Diagnosis not present

## 2015-05-22 DIAGNOSIS — N179 Acute kidney failure, unspecified: Secondary | ICD-10-CM | POA: Diagnosis not present

## 2015-05-22 DIAGNOSIS — E871 Hypo-osmolality and hyponatremia: Secondary | ICD-10-CM

## 2015-05-22 LAB — COMPREHENSIVE METABOLIC PANEL
ALK PHOS: 128 U/L — AB (ref 38–126)
ALT: 17 U/L (ref 14–54)
AST: 23 U/L (ref 15–41)
Albumin: 2.3 g/dL — ABNORMAL LOW (ref 3.5–5.0)
Anion gap: 5 (ref 5–15)
BUN: 57 mg/dL — AB (ref 6–20)
CHLORIDE: 106 mmol/L (ref 101–111)
CO2: 15 mmol/L — AB (ref 22–32)
CREATININE: 1.69 mg/dL — AB (ref 0.44–1.00)
Calcium: 8 mg/dL — ABNORMAL LOW (ref 8.9–10.3)
GFR calc Af Amer: 37 mL/min — ABNORMAL LOW (ref >60)
GFR calc non Af Amer: 32 mL/min — ABNORMAL LOW (ref >60)
GLUCOSE: 143 mg/dL — AB (ref 65–99)
Potassium: 3.5 mmol/L (ref 3.5–5.1)
Sodium: 126 mmol/L — ABNORMAL LOW (ref 135–145)
Total Bilirubin: 1.5 mg/dL — ABNORMAL HIGH (ref 0.3–1.2)
Total Protein: 4.5 g/dL — ABNORMAL LOW (ref 6.5–8.1)

## 2015-05-22 LAB — CBC
HCT: 27.2 % — ABNORMAL LOW (ref 36.0–46.0)
HEMOGLOBIN: 9.7 g/dL — AB (ref 12.0–15.0)
MCH: 30.4 pg (ref 26.0–34.0)
MCHC: 35.7 g/dL (ref 30.0–36.0)
MCV: 85.3 fL (ref 78.0–100.0)
PLATELETS: 68 10*3/uL — AB (ref 150–400)
RBC: 3.19 MIL/uL — AB (ref 3.87–5.11)
RDW: 15 % (ref 11.5–15.5)
WBC: 5.8 10*3/uL (ref 4.0–10.5)

## 2015-05-22 LAB — OSMOLALITY: Osmolality: 291 mOsm/kg (ref 275–295)

## 2015-05-22 LAB — OSMOLALITY, URINE: Osmolality, Ur: 272 mOsm/kg — ABNORMAL LOW (ref 300–900)

## 2015-05-22 NOTE — Care Management Note (Signed)
Case Management Note  Patient Details  Name: Kathryn [name] MRN: [id number] Date of Birth: September 30, 1955  Subjective/Objective:      Spoke with patient who is alert and oriented from home with husband. Stated that shecan afford medications and are able to make thier medical appointments without difficulty. No DME or O2 at home. No CM needs identified.              Action/Plan:Home with self care.   Expected Discharge Date:                  Expected Discharge Plan:  Home/Self Care  In-House Referral:     Discharge planning Services  CM Consult  Post Acute Care Choice:    Choice offered to:     DME Arranged:    DME Agency:     HH Arranged:    HH Agency:     Status of Service:  Completed, signed off  Medicare Important Message Given:    Date Medicare IM Given:    Medicare IM give by:    Date Additional Medicare IM Given:    Additional Medicare Important Message give by:     If discussed at Long Length of Stay Meetings, dates discussed:    Additional Comments:  Adonis Huguenin, RN 05/22/2015, 12:52 PM

## 2015-05-22 NOTE — Progress Notes (Signed)
[facility] PROGRESS NOTE  Kathryn [name] YQM:[id number] DOB: October 17, 1955    PCP:   [name], FNP   HPI: Kathryn [name] is an 60 y.o. female lives at home with her husband, with hx of crohn's HTN, hep C, renal insufficiency, thrombocytopenia, liver cirrhosis, admitted for AKI, volume depletion, hypokalemia, hyponatremia, and UTI.  She was given IVF, free water restriction, and Cipro.  Her Na rose to 126, and her Cr improved.  She is feeling better.   Rewiew of Systems:  Constitutional: Negative for malaise, fever and chills. No significant weight loss or weight gain Eyes: Negative for eye pain, redness and discharge, diplopia, visual changes, or flashes of light. ENMT: Negative for ear pain, hoarseness, nasal congestion, sinus pressure and sore throat. No headaches; tinnitus, drooling, or problem swallowing. Cardiovascular: Negative for chest pain, palpitations, diaphoresis, dyspnea and peripheral edema. ; No orthopnea, PND Respiratory: Negative for cough, hemoptysis, wheezing and stridor. No pleuritic chestpain. Gastrointestinal: Negative for nausea, vomiting, diarrhea, constipation, abdominal pain, melena, blood in stool, hematemesis, jaundice and rectal bleeding.    Genitourinary: Negative for frequency, dysuria, incontinence,flank pain and hematuria; Musculoskeletal: Negative for back pain and neck pain. Negative for swelling and trauma.;  Skin: . Negative for pruritus, rash, abrasions, bruising and skin lesion.; ulcerations Neuro: Negative for headache, lightheadedness and neck stiffness. Negative for weakness, altered level of consciousness , altered mental status, extremity weakness, burning feet, involuntary movement, seizure and syncope.  Psych: negative for anxiety, depression, insomnia, tearfulness, panic attacks, hallucinations, paranoia, suicidal or homicidal ideation    Past Medical History  Diagnosis Date  . Hypertension   . Enteritis presumed infectious   .  Crohn's   . Anemia   . Hepatitis C antibody test positive   . Hepatitis   . Renal insufficiency   . Cirrhosis (HCC)   . Splenomegaly   . Bilateral leg edema 01/2014  . Chronic back pain   . Thrombocytopenia [facility])     Past Surgical History  Procedure Laterality Date  . Colon surgery      MULTIPLE SURGERIES FOR CROHNS  . Ileostomy      multiple abdominal surgeries for Crohn's by Dr. Gabriel Cirri  . Cholecystectomy    . Abscess drainage      abdominal  . Esophagogastroduodenoscopy  10/05/2010    Procedure: ESOPHAGOGASTRODUODENOSCOPY (EGD);  Surgeon: [name], MD;  [facility]: [facility];  Service: Endoscopy;  Laterality: N/A;  7:30 am  . Cirrhosis    . Esophagogastroduodenoscopy N/A 09/11/2013    Procedure: ESOPHAGOGASTRODUODENOSCOPY (EGD);  Surgeon: [name], MD;  Location: [facility];  Service: Endoscopy;  Laterality: N/A;  . Ileoscopy  09/11/2013    Procedure: ILEOSCOPY THROUGH STOMA;  Surgeon: [name], MD;  Location: [facility];  Service: Endoscopy;;  . Givens capsule study N/A 09/12/2013    Procedure: GIVENS CAPSULE STUDY;  Surgeon: [name], MD;  Location: [facility];  Service: Endoscopy;  Laterality: N/A;  . Givens capsule study N/A 11/25/2013    Procedure: GIVENS CAPSULE STUDY;  Surgeon: [name], MD;  Location: [facility];  Service: Endoscopy;  Laterality: N/A;  . Varices cauterization Right     in the stoma of her ileostomy  . Tips procedure  11/2013    [facility]     Medications:  HOME MEDS: Prior to Admission medications   Medication Sig Start Date End Date Taking? Authorizing Provider  ALPRAZolam (XANAX) 1 MG tablet TAKE 1 TABLET BY MOUTH  AT BEDTIME AS NEEDED 05/05/15  Yes Junie Spencer, FNP  Cholecalciferol (VITAMIN D-1000 MAX ST) 1000 UNITS tablet Take 1,000 Units by mouth daily.   Yes Historical Provider, MD  ciprofloxacin (CIPRO) 500 MG tablet Take 1 tablet (500 mg total) by mouth 2 (two) times daily. 05/19/15  Yes Junie Spencer, FNP  feeding supplement, ENSURE ENLIVE, (ENSURE ENLIVE) LIQD Take 237 mLs by mouth daily. 03/27/15  Yes Standley Brooking, MD  furosemide (LASIX) 20 MG tablet Take 1 tablet (20 mg total) by mouth daily. 04/22/15  Yes Erick Blinks, MD  gabapentin (NEURONTIN) 100 MG capsule Take 1 capsule (100 mg total) by mouth 3 (three) times daily. 04/23/15  Yes Ernestina Penna, MD  lactulose (CHRONULAC) 10 GM/15ML solution Take 30 mLs (20 g total) by mouth 2 (two) times daily. 03/27/15  Yes Standley Brooking, MD  mesalamine (CANASA) 1000 MG suppository Place 1 suppository (1,000 mg total) rectally at bedtime. 04/22/15  Yes Erick Blinks, MD  Multiple Vitamins-Minerals (MULTIVITAMIN WITH MINERALS) tablet Take 1 tablet by mouth daily.   Yes Historical Provider, MD  nadolol (CORGARD) 20 MG tablet Take 0.5 tablets (10 mg total) by mouth daily. 05/19/15  Yes Junie Spencer, FNP  pantoprazole (PROTONIX) 40 MG tablet TAKE 1 TABLET BY MOUTH DAILY 06/09/14  Yes Len Blalock, NP  potassium chloride (K-DUR) 10 MEQ tablet Take 1 tablet (10 mEq total) by mouth 2 (two) times daily. 05/21/15  Yes Junie Spencer, FNP  Probiotic Product (PROBIOTIC PO) Take 1 tablet by mouth daily.   Yes Historical Provider, MD  spironolactone (ALDACTONE) 25 MG tablet Take 1 tablet (25 mg total) by mouth daily. 04/22/15  Yes Erick Blinks, MD     Allergies:  Allergies  Allergen Reactions  . Penicillins Rash    Has patient had a PCN reaction causing immediate rash, facial/tongue/throat swelling, SOB or lightheadedness with hypotension: Yes Has patient had a PCN reaction causing severe rash involving mucus membranes or skin necrosis: No Has patient had a PCN reaction that required hospitalization No Has patient had a PCN reaction occurring within the last 10 years: No If all of the above answers are "NO", then may proceed with Cephalosporin use.     Social History:   reports that she has never smoked. She has never used smokeless  tobacco. She reports that she does not drink alcohol or use illicit drugs.  Family History: Family History  Problem Relation Age of Onset  . Cancer Mother   . Stroke Father      Physical Exam: Filed Vitals:   05/21/15 1800 05/21/15 1830 05/21/15 1931 05/22/15 0500  BP: 105/65 96/63 101/56 109/50  Pulse: 60 57 65 71  Temp:   98 F (36.7 C) 97.7 F (36.5 C)  TempSrc:   Oral Oral  Resp: 19 26 19 18   Height:   5\' 5"  (1.651 m)   Weight:   87.317 kg (192 lb 8 oz)   SpO2: 100% 100% 100% 99%   Blood pressure 109/50, pulse 71, temperature 97.7 F (36.5 C), temperature source Oral, resp. rate 18, height 5\' 5"  (1.651 m), weight 87.317 kg (192 lb 8 oz), SpO2 99 %.  GEN:  Pleasant  patient lying in the stretcher in no acute distress; cooperative with exam. PSYCH:  alert and oriented x4; does not appear anxious or depressed; affect is appropriate. HEENT: Mucous membranes pink and anicteric; PERRLA; EOM intact; no cervical lymphadenopathy nor thyromegaly or carotid bruit; no JVD;  There were no stridor. Neck is very supple. Breasts:: Not examined CHEST WALL: No tenderness CHEST: Normal respiration, clear to auscultation bilaterally.  HEART: Regular rate and rhythm.  There are no murmur, rub, or gallops.   BACK: No kyphosis or scoliosis; no CVA tenderness ABDOMEN: soft and non-tender; no masses, no organomegaly, normal abdominal bowel sounds; no pannus; no intertriginous candida. There is no rebound and no distention. Rectal Exam: Not done EXTREMITIES: No bone or joint deformity; age-appropriate arthropathy of the hands and knees; no edema; no ulcerations.  There is no calf tenderness. Genitalia: not examined PULSES: 2+ and symmetric SKIN: Normal hydration no rash or ulceration CNS: Cranial nerves 2-12 grossly intact no focal lateralizing neurologic deficit.  Speech is fluent; uvula elevated with phonation, facial symmetry and tongue midline. DTR are normal bilaterally, cerebella exam is  intact, barbinski is negative and strengths are equaled bilaterally.  No sensory loss.   Labs on Admission:  Basic Metabolic Panel:  Recent Labs Lab 05/19/15 1447 05/21/15 1305 05/22/15 0648  NA 126* 123* 126*  K 3.3* 2.7* 3.5  CL 96 102 106  CO2 15* 15* 15*  GLUCOSE 110* 189* 143*  BUN 68* 73* 57*  CREATININE 2.55* 2.26* 1.69*  CALCIUM 8.9 8.2* 8.0*   Liver Function Tests:  Recent Labs Lab 05/19/15 1447 05/21/15 1305 05/22/15 0648  AST 31 26 23   ALT 21 20 17   ALKPHOS 222* 155* 128*  BILITOT 1.8* 2.0* 1.5*  PROT 5.6* 5.2* 4.5*  ALBUMIN 3.1* 2.6* 2.3*    Recent Labs Lab 05/21/15 1305  LIPASE 114*    Recent Labs Lab 05/19/15 1447 05/21/15 1305  AMMONIA 94* 54*   CBC:  Recent Labs Lab 05/19/15 1447 05/21/15 1305 05/22/15 0648  WBC 12.1* 9.3 5.8  NEUTROABS 8.2* 6.3  --   HGB  --  11.1* 9.7*  HCT 36.5 31.2* 27.2*  MCV 89 85.7 85.3  PLT 116* 90* 68*    Radiological Exams on Admission: Ct Abdomen Pelvis Wo Contrast  05/21/2015  CLINICAL DATA:  Left-sided flank pain and generalized weakness. Cirrhosis. Stage 3 kidney disease. EXAM: CT ABDOMEN AND PELVIS WITHOUT CONTRAST TECHNIQUE: Multidetector CT imaging of the abdomen and pelvis was performed following the standard protocol without IV contrast. COMPARISON:  03/10/2015 FINDINGS: Lower chest: The lung bases are clear. No pleural or pericardial effusion noted. Hepatobiliary: A TIPS shunt is identified. Morphologic features of liver compatible with cirrhosis noted. No focal liver abnormality identified within the limitations of noncontrast thickening. Previous cholecystectomy. No biliary dilatation. Pancreas: No mass or inflammatory process identified on this un-enhanced exam. Spleen: The spleen is enlarged measuring 14 cm in length. Adrenals/Urinary Tract: Normal adrenal glands. No focal kidney abnormality identified. Urinary bladder appears normal for degree of distention. Stomach/Bowel: Normal appearance of the  stomach. Right lower quadrant ileostomy is identified. Enteric contrast material is identified throughout the small bowel loops up to the level of the ileostomy. Again noted is a ventral abdominal wall hernia which contains a loop of small bowel. This does not scratch set there does not appear to be a high-grade obstruction associated with this hernia at this time. Hartman's pouch is unremarkable. Vascular/Lymphatic: Normal appearance of the abdominal aorta. No enlarged retroperitoneal or mesenteric adenopathy. No enlarged pelvic or inguinal lymph nodes. Reproductive: No mass or other significant abnormality. Other: None. Musculoskeletal: Spondylosis noted within the lower thoracic and lumbar spine. IMPRESSION: 1. No acute findings and no explanation for patient's left flank pain. No kidney stones or ureteral calculi  identified at this time. 2. Ventral abdominal wall hernia is again noted containing a nonobstructed loop of small bowel. 3. Right lower quadrant ileostomy. 4. 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 Electronically Signed   By: Signa Kell M.D.   On: 05/21/2015 17:17   Dg Chest 2 View  05/21/2015  CLINICAL DATA:  Weakness, cirrhosis, kidney disease EXAM: CHEST  2 VIEW COMPARISON:  04/16/2015 FINDINGS: Cardiomediastinal silhouette is stable. No infiltrate or pleural effusion. No pulmonary edema. Bony thorax is unremarkable. IMPRESSION: No active cardiopulmonary disease. Electronically Signed   By: Natasha Mead M.D.   On: 05/21/2015 13:42    EKG: Independently reviewed.   Assessment/Plan Present on Admission:  . Hyponatremia . Hypokalemia . AKI (acute kidney injury) (HCC)   PLAN:  Hyponatremia Patient presenting with generalized weakness with sodium 123, now 126. Will continue with current Tx. Will put on fluid restriction 1200 mL daily by mouth  AKI on CKD Patient has elevated BUN/creatinine 73/2.26, her baseline creatinine as of March 27 was 40/1.79. Will hold Lasix and Spirono  lactone at this time. Start gentle IV hydration with normal saline. Her Cr has improved, now at baseline.   ?UTI  patient has mildly abnormal UA, will start ciprofloxacin.  Follow urine culture results.    Hypokalemia Replace potassium.  Normal today.  Liver cirrhosis  Patient has history of liver cirrhosis, hepatitis C Ammonia level 54, patient not in encephalopathy Hold diuretics at this time due to above.  Thrombocytopenia Platelet count of 90, secondary to liver cirrhosis  DVT prophylaxis SCDs  Code status: DO NOT RESUSCITATE   Aaiden Depoy, MD.  FACP [facility] Pager [phone] 7pm to 7am.  05/22/2015, 10:00 AM

## 2015-05-22 NOTE — Care Management Obs Status (Signed)
MEDICARE OBSERVATION STATUS NOTIFICATION   Patient Details  Name: Kathryn [name] MRN: [id number] Date of Birth: [birth year]   Medicare Observation Status Notification Given:  Yes    Adonis Huguenin, RN 05/22/2015, 12:52 PM

## 2015-05-23 DIAGNOSIS — K7460 Unspecified cirrhosis of liver: Secondary | ICD-10-CM | POA: Diagnosis present

## 2015-05-23 DIAGNOSIS — R531 Weakness: Secondary | ICD-10-CM | POA: Diagnosis present

## 2015-05-23 DIAGNOSIS — I129 Hypertensive chronic kidney disease with stage 1 through stage 4 chronic kidney disease, or unspecified chronic kidney disease: Secondary | ICD-10-CM | POA: Diagnosis present

## 2015-05-23 DIAGNOSIS — Z66 Do not resuscitate: Secondary | ICD-10-CM | POA: Diagnosis present

## 2015-05-23 DIAGNOSIS — B1920 Unspecified viral hepatitis C without hepatic coma: Secondary | ICD-10-CM | POA: Diagnosis present

## 2015-05-23 DIAGNOSIS — N390 Urinary tract infection, site not specified: Secondary | ICD-10-CM | POA: Diagnosis present

## 2015-05-23 DIAGNOSIS — N189 Chronic kidney disease, unspecified: Secondary | ICD-10-CM | POA: Diagnosis present

## 2015-05-23 DIAGNOSIS — E876 Hypokalemia: Secondary | ICD-10-CM | POA: Diagnosis not present

## 2015-05-23 DIAGNOSIS — E871 Hypo-osmolality and hyponatremia: Secondary | ICD-10-CM | POA: Diagnosis not present

## 2015-05-23 DIAGNOSIS — D6959 Other secondary thrombocytopenia: Secondary | ICD-10-CM | POA: Diagnosis present

## 2015-05-23 DIAGNOSIS — N179 Acute kidney failure, unspecified: Secondary | ICD-10-CM | POA: Diagnosis not present

## 2015-05-23 LAB — BASIC METABOLIC PANEL
Anion gap: 4 — ABNORMAL LOW (ref 5–15)
BUN: 47 mg/dL — ABNORMAL HIGH (ref 6–20)
CHLORIDE: 103 mmol/L (ref 101–111)
CO2: 15 mmol/L — AB (ref 22–32)
CREATININE: 1.82 mg/dL — AB (ref 0.44–1.00)
Calcium: 7.8 mg/dL — ABNORMAL LOW (ref 8.9–10.3)
GFR calc Af Amer: 34 mL/min — ABNORMAL LOW (ref >60)
GFR calc non Af Amer: 29 mL/min — ABNORMAL LOW (ref >60)
Glucose, Bld: 174 mg/dL — ABNORMAL HIGH (ref 65–99)
Potassium: 3 mmol/L — ABNORMAL LOW (ref 3.5–5.1)
SODIUM: 122 mmol/L — AB (ref 135–145)

## 2015-05-23 MED ORDER — KCL IN DEXTROSE-NACL 40-5-0.9 MEQ/L-%-% IV SOLN
INTRAVENOUS | Status: DC
  Administered 2015-05-23: 14:00:00 via INTRAVENOUS
  Filled 2015-05-23 (×7): qty 1000

## 2015-05-23 NOTE — Progress Notes (Signed)
[facility] PROGRESS NOTE  Kathryn [name] NWG:[id number] DOB: 08/19/1955    PCP:   [name], FNP   HPI:  Kathryn [name] is an 60 y.o. female lives at home with her husband, with hx of crohn's HTN, hep C, renal insufficiency, thrombocytopenia, liver cirrhosis, admitted for AKI, volume depletion, hypokalemia, hyponatremia, and UTI. She was given IVF, free water restriction, and Cipro. Her Na rose to 126 but subsequently dropped back to 122, and her Cr improved. She is feeling better. Her K is low at 3.1.  Urine culture is still pending.    Rewiew of Systems:  Constitutional: Negative for malaise, fever and chills. No significant weight loss or weight gain Eyes: Negative for eye pain, redness and discharge, diplopia, visual changes, or flashes of light. ENMT: Negative for ear pain, hoarseness, nasal congestion, sinus pressure and sore throat. No headaches; tinnitus, drooling, or problem swallowing. Cardiovascular: Negative for chest pain, palpitations, diaphoresis, dyspnea and peripheral edema. ; No orthopnea, PND Respiratory: Negative for cough, hemoptysis, wheezing and stridor. No pleuritic chestpain. Gastrointestinal: Negative for nausea, vomiting, diarrhea, constipation, abdominal pain, melena, blood in stool, hematemesis, jaundice and rectal bleeding.    Genitourinary: Negative for frequency, dysuria, incontinence,flank pain and hematuria; Musculoskeletal: Negative for back pain and neck pain. Negative for swelling and trauma.;  Skin: . Negative for pruritus, rash, abrasions, bruising and skin lesion.; ulcerations Neuro: Negative for headache, lightheadedness and neck stiffness. Negative for weakness, altered level of consciousness , altered mental status, extremity weakness, burning feet, involuntary movement, seizure and syncope.  Psych: negative for anxiety, depression, insomnia, tearfulness, panic attacks, hallucinations, paranoia, suicidal or homicidal ideation    Past  Medical History  Diagnosis Date  . Hypertension   . Enteritis presumed infectious   . Crohn's   . Anemia   . Hepatitis C antibody test positive   . Hepatitis   . Renal insufficiency   . Cirrhosis (HCC)   . Splenomegaly   . Bilateral leg edema 01/2014  . Chronic back pain   . Thrombocytopenia [facility])     Past Surgical History  Procedure Laterality Date  . Colon surgery      MULTIPLE SURGERIES FOR CROHNS  . Ileostomy      multiple abdominal surgeries for Crohn's by Dr. Gabriel Cirri  . Cholecystectomy    . Abscess drainage      abdominal  . Esophagogastroduodenoscopy  10/05/2010    Procedure: ESOPHAGOGASTRODUODENOSCOPY (EGD);  Surgeon: [name], MD;  [facility];  Service: Endoscopy;  Laterality: N/A;  7:30 am  . Cirrhosis    . Esophagogastroduodenoscopy N/A 09/11/2013    Procedure: ESOPHAGOGASTRODUODENOSCOPY (EGD);  Surgeon: [name], MD;  Location: [facility];  Service: Endoscopy;  Laterality: N/A;  . Ileoscopy  09/11/2013    Procedure: ILEOSCOPY THROUGH STOMA;  Surgeon: [name], MD;  Location: [facility];  Service: Endoscopy;;  . Givens capsule study N/A 09/12/2013    Procedure: GIVENS CAPSULE STUDY;  Surgeon: [name], MD;  Location: [facility];  Service: Endoscopy;  Laterality: N/A;  . Givens capsule study N/A 11/25/2013    Procedure: GIVENS CAPSULE STUDY;  Surgeon: [name], MD;  Location: [facility];  Service: Endoscopy;  Laterality: N/A;  . Varices cauterization Right     in the stoma of her ileostomy  . Tips procedure  11/2013    [facility]     Medications:  HOME MEDS: Prior to Admission medications   Medication Sig Start  Date End Date Taking? Authorizing Provider  ALPRAZolam Prudy Feeler) 1 MG tablet TAKE 1 TABLET BY MOUTH AT BEDTIME AS NEEDED 05/05/15  Yes Junie Spencer, FNP  Cholecalciferol (VITAMIN D-1000 MAX ST) 1000 UNITS tablet Take 1,000 Units by mouth daily.   Yes Historical Provider, MD  ciprofloxacin (CIPRO) 500 MG  tablet Take 1 tablet (500 mg total) by mouth 2 (two) times daily. 05/19/15  Yes Junie Spencer, FNP  feeding supplement, ENSURE ENLIVE, (ENSURE ENLIVE) LIQD Take 237 mLs by mouth daily. 03/27/15  Yes Standley Brooking, MD  furosemide (LASIX) 20 MG tablet Take 1 tablet (20 mg total) by mouth daily. 04/22/15  Yes Erick Blinks, MD  gabapentin (NEURONTIN) 100 MG capsule Take 1 capsule (100 mg total) by mouth 3 (three) times daily. 04/23/15  Yes Ernestina Penna, MD  lactulose (CHRONULAC) 10 GM/15ML solution Take 30 mLs (20 g total) by mouth 2 (two) times daily. 03/27/15  Yes Standley Brooking, MD  mesalamine (CANASA) 1000 MG suppository Place 1 suppository (1,000 mg total) rectally at bedtime. 04/22/15  Yes Erick Blinks, MD  Multiple Vitamins-Minerals (MULTIVITAMIN WITH MINERALS) tablet Take 1 tablet by mouth daily.   Yes Historical Provider, MD  nadolol (CORGARD) 20 MG tablet Take 0.5 tablets (10 mg total) by mouth daily. 05/19/15  Yes Junie Spencer, FNP  pantoprazole (PROTONIX) 40 MG tablet TAKE 1 TABLET BY MOUTH DAILY 06/09/14  Yes Len Blalock, NP  potassium chloride (K-DUR) 10 MEQ tablet Take 1 tablet (10 mEq total) by mouth 2 (two) times daily. 05/21/15  Yes Junie Spencer, FNP  Probiotic Product (PROBIOTIC PO) Take 1 tablet by mouth daily.   Yes Historical Provider, MD  spironolactone (ALDACTONE) 25 MG tablet Take 1 tablet (25 mg total) by mouth daily. 04/22/15  Yes Erick Blinks, MD     Allergies:  Allergies  Allergen Reactions  . Penicillins Rash    Has patient had a PCN reaction causing immediate rash, facial/tongue/throat swelling, SOB or lightheadedness with hypotension: Yes Has patient had a PCN reaction causing severe rash involving mucus membranes or skin necrosis: No Has patient had a PCN reaction that required hospitalization No Has patient had a PCN reaction occurring within the last 10 years: No If all of the above answers are "NO", then may proceed with Cephalosporin use.      Social History:   reports that she has never smoked. She has never used smokeless tobacco. She reports that she does not drink alcohol or use illicit drugs.  Family History: Family History  Problem Relation Age of Onset  . Cancer Mother   . Stroke Father      Physical Exam: Filed Vitals:   05/22/15 1551 05/22/15 2130 05/23/15 0444 05/23/15 1045  BP: 123/62 98/52 102/55   Pulse: 76 83 69   Temp: 98.7 F (37.1 C) 98.3 F (36.8 C) 97.9 F (36.6 C)   TempSrc: Oral Oral Oral   Resp: 18 20 20    Height:      Weight:      SpO2: 100% 97% 100% 99%   Blood pressure 102/55, pulse 69, temperature 97.9 F (36.6 C), temperature source Oral, resp. rate 20, height 5\' 5"  (1.651 m), weight 87.317 kg (192 lb 8 oz), SpO2 99 %.  GEN:  Pleasant  patient lying in the stretcher in no acute distress; cooperative with exam. PSYCH:  alert and oriented x4; does not appear anxious or depressed; affect is appropriate. HEENT: Mucous membranes pink and anicteric; PERRLA;  EOM intact; no cervical lymphadenopathy nor thyromegaly or carotid bruit; no JVD; There were no stridor. Neck is very supple. Breasts:: Not examined CHEST WALL: No tenderness CHEST: Normal respiration, clear to auscultation bilaterally.  HEART: Regular rate and rhythm.  There are no murmur, rub, or gallops.   BACK: No kyphosis or scoliosis; no CVA tenderness ABDOMEN: soft and non-tender; no masses, no organomegaly, normal abdominal bowel sounds; no pannus; no intertriginous candida. There is no rebound and no distention. Rectal Exam: Not done EXTREMITIES: No bone or joint deformity; age-appropriate arthropathy of the hands and knees; no edema; no ulcerations.  There is no calf tenderness. Genitalia: not examined PULSES: 2+ and symmetric SKIN: Normal hydration no rash or ulceration CNS: Cranial nerves 2-12 grossly intact no focal lateralizing neurologic deficit.  Speech is fluent; uvula elevated with phonation, facial symmetry and  tongue midline. DTR are normal bilaterally, cerebella exam is intact, barbinski is negative and strengths are equaled bilaterally.  No sensory loss.   Labs on Admission:  Basic Metabolic Panel:  Recent Labs Lab 05/19/15 1447 05/21/15 1305 05/22/15 0648 05/23/15 1147  NA 126* 123* 126* 122*  K 3.3* 2.7* 3.5 3.0*  CL 96 102 106 103  CO2 15* 15* 15* 15*  GLUCOSE 110* 189* 143* 174*  BUN 68* 73* 57* 47*  CREATININE 2.55* 2.26* 1.69* 1.82*  CALCIUM 8.9 8.2* 8.0* 7.8*   Liver Function Tests:  Recent Labs Lab 05/19/15 1447 05/21/15 1305 05/22/15 0648  AST 31 26 23   ALT 21 20 17   ALKPHOS 222* 155* 128*  BILITOT 1.8* 2.0* 1.5*  PROT 5.6* 5.2* 4.5*  ALBUMIN 3.1* 2.6* 2.3*    Recent Labs Lab 05/21/15 1305  LIPASE 114*    Recent Labs Lab 05/19/15 1447 05/21/15 1305  AMMONIA 94* 54*   CBC:  Recent Labs Lab 05/19/15 1447 05/21/15 1305 05/22/15 0648  WBC 12.1* 9.3 5.8  NEUTROABS 8.2* 6.3  --   HGB  --  11.1* 9.7*  HCT 36.5 31.2* 27.2*  MCV 89 85.7 85.3  PLT 116* 90* 68*    Radiological Exams on Admission: Ct Abdomen Pelvis Wo Contrast  05/21/2015  CLINICAL DATA:  Left-sided flank pain and generalized weakness. Cirrhosis. Stage 3 kidney disease. EXAM: CT ABDOMEN AND PELVIS WITHOUT CONTRAST TECHNIQUE: Multidetector CT imaging of the abdomen and pelvis was performed following the standard protocol without IV contrast. COMPARISON:  03/10/2015 FINDINGS: Lower chest: The lung bases are clear. No pleural or pericardial effusion noted. Hepatobiliary: A TIPS shunt is identified. Morphologic features of liver compatible with cirrhosis noted. No focal liver abnormality identified within the limitations of noncontrast thickening. Previous cholecystectomy. No biliary dilatation. Pancreas: No mass or inflammatory process identified on this un-enhanced exam. Spleen: The spleen is enlarged measuring 14 cm in length. Adrenals/Urinary Tract: Normal adrenal glands. No focal kidney  abnormality identified. Urinary bladder appears normal for degree of distention. Stomach/Bowel: Normal appearance of the stomach. Right lower quadrant ileostomy is identified. Enteric contrast material is identified throughout the small bowel loops up to the level of the ileostomy. Again noted is a ventral abdominal wall hernia which contains a loop of small bowel. This does not scratch set there does not appear to be a high-grade obstruction associated with this hernia at this time. Hartman's pouch is unremarkable. Vascular/Lymphatic: Normal appearance of the abdominal aorta. No enlarged retroperitoneal or mesenteric adenopathy. No enlarged pelvic or inguinal lymph nodes. Reproductive: No mass or other significant abnormality. Other: None. Musculoskeletal: Spondylosis noted within the lower thoracic  and lumbar spine. IMPRESSION: 1. No acute findings and no explanation for patient's left flank pain. No kidney stones or ureteral calculi identified at this time. 2. Ventral abdominal wall hernia is again noted containing a nonobstructed loop of small bowel. 3. Right lower quadrant ileostomy. 4. 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 Electronically Signed   By: Signa Kell M.D.   On: 05/21/2015 17:17   Dg Chest 2 View  05/21/2015  CLINICAL DATA:  Weakness, cirrhosis, kidney disease EXAM: CHEST  2 VIEW COMPARISON:  04/16/2015 FINDINGS: Cardiomediastinal silhouette is stable. No infiltrate or pleural effusion. No pulmonary edema. Bony thorax is unremarkable. IMPRESSION: No active cardiopulmonary disease. Electronically Signed   By: Natasha Mead M.D.   On: 05/21/2015 13:42    EKG: Independently reviewed.    Assessment/Plan Present on Admission:  . Hyponatremia . Hypokalemia . AKI (acute kidney injury) (HCC)   AKI on CKD Patient has elevated BUN/creatinine 73/2.26, her baseline creatinine as of March 27 was 40/1.79. Will hold Lasix and Spirono lactone at this time. Start gentle IV hydration with normal  saline. Her Cr has improved, now at baseline.   ?UTI  patient has mildly abnormal UA.  Will continue with Cipro pending culture.  Follow urine culture results.    Hypokalemia:  Will continue with K replacement.    Liver cirrhosis  Patient has history of liver cirrhosis, hepatitis C Ammonia level 54, patient not in encephalopathy Hold diuretics at this time due to above.  Thrombocytopenia Platelet count of 90, secondary to liver cirrhosis  DVT prophylaxis SCDs  Code status: DO NOT RESUSCITATE   Carlson Belland, MD.  FACP [facility] Pager [phone] 7pm to 7am.  05/23/2015, 12:49 PM

## 2015-05-24 LAB — BASIC METABOLIC PANEL
ANION GAP: 3 — AB (ref 5–15)
BUN: 43 mg/dL — ABNORMAL HIGH (ref 6–20)
CALCIUM: 7.7 mg/dL — AB (ref 8.9–10.3)
CO2: 13 mmol/L — ABNORMAL LOW (ref 22–32)
CREATININE: 1.87 mg/dL — AB (ref 0.44–1.00)
Chloride: 108 mmol/L (ref 101–111)
GFR, EST AFRICAN AMERICAN: 33 mL/min — AB (ref >60)
GFR, EST NON AFRICAN AMERICAN: 28 mL/min — AB (ref >60)
Glucose, Bld: 125 mg/dL — ABNORMAL HIGH (ref 65–99)
Potassium: 4 mmol/L (ref 3.5–5.1)
SODIUM: 124 mmol/L — AB (ref 135–145)

## 2015-05-24 LAB — URINE CULTURE

## 2015-05-24 NOTE — Progress Notes (Signed)
Notified MD, pt has lost iv access. She requested nurse contact MD to see if she could take her Cipro antibiotic by mouth and not be stuck again. Awaiting MD's response.

## 2015-05-24 NOTE — Discharge Summary (Signed)
Physician Discharge Summary  Kathryn [name] ZOX:[id number] DOB: [birth year] DOA: 05/21/2015  PCP: [name], FNP  Admit date: 05/21/2015 Discharge date: 05/24/2015  Time spent: 55 minutes  Recommendations for Outpatient Follow-up:  1. Follow up with PCP in 1-2 weeks   Discharge Diagnoses:  Active Problems:   Hyponatremia   Hypokalemia   AKI (acute kidney injury) [facility])   Discharge Condition: improved  Diet recommendation: regular  Filed Weights   05/21/15 1931  Weight: 87.317 kg (192 lb 8 oz)    History of present illness:  60 yof with a hx of HTN, Hep C, Crohn's, Cirrhosis, and Chronic back pain presented to the ED with complaints of generalized weakness. She reported having a decreased PO intake over the past few days, though denied any nausea or vomiting. Additionally,  She had recently had lab work done which revealed hyponatremia. While in the ED, she was found to have a sodium level of 123, her BUN and creatinine were elevated, and she had hypokalemia.   Hospital Course:   Patient was found to have an acute kidney injury on chronic kidney disease. On admission, her BUN and creatinine were elevated so hre Lasix and spirono lactone were held. She was also started on gentle IV hydration. Her BUN and creatinine appeared to be improving and returned to baseline. She appeared to improve and was ready for discharge 4/16.   Hyponatremia with a sodium level of 123, she was subsequently admitted for observation. She was placed on a fluid restricted diet and her Na waxes and wanes.  It was likely not an acute process, and she will need to be on fluid restriction.  She will use no more than a third of a gallon of water per day.  I will resume her lasix to get rid of free water, but will hold off on resuming her spinololactone.  She will see her PCP next week in order to check her electrolytes.    With respect to her UTI, though her urine culture is still pending, it was GNR, and she was  given 3 days of IV Cipro.  This should be adequate Tx for her UTI. She is clinically better as well.   Procedures:  none  Consultations:  none  Discharge Exam: Filed Vitals:   05/23/15 2048 05/24/15 0526  BP: 105/48 99/40  Pulse: 78 82  Temp: 98 F (36.7 C) 98.1 F (36.7 C)  Resp: 20 20     General: NAD, looks comfortable  Cardiovascular: RRR, S1, S2   Respiratory: clear bilaterally, No wheezing, rales or rhonchi  Abdomen: soft, non tender, no distention , bowel sounds normal  Musculoskeletal: No edema b/l   Discharge Instructions    Current Discharge Medication List    CONTINUE these medications which have NOT CHANGED   Details  ALPRAZolam (XANAX) 1 MG tablet TAKE 1 TABLET BY MOUTH AT BEDTIME AS NEEDED Qty: 30 tablet, Refills: 2    Cholecalciferol (VITAMIN D-1000 MAX ST) 1000 UNITS tablet Take 1,000 Units by mouth daily.    ciprofloxacin (CIPRO) 500 MG tablet Take 1 tablet (500 mg total) by mouth 2 (two) times daily. Qty: 20 tablet, Refills: 0    feeding supplement, ENSURE ENLIVE, (ENSURE ENLIVE) LIQD Take 237 mLs by mouth daily.    furosemide (LASIX) 20 MG tablet Take 1 tablet (20 mg total) by mouth daily. Qty: 30 tablet, Refills: 1    gabapentin (NEURONTIN) 100 MG capsule Take 1 capsule (100 mg total) by mouth 3 (three)  times daily. Qty: 270 capsule, Refills: 0    lactulose (CHRONULAC) 10 GM/15ML solution Take 30 mLs (20 g total) by mouth 2 (two) times daily. Qty: 1892 mL, Refills: 1    mesalamine (CANASA) 1000 MG suppository Place 1 suppository (1,000 mg total) rectally at bedtime. Qty: 30 suppository, Refills: 12    Multiple Vitamins-Minerals (MULTIVITAMIN WITH MINERALS) tablet Take 1 tablet by mouth daily.    nadolol (CORGARD) 20 MG tablet Take 0.5 tablets (10 mg total) by mouth daily. Qty: 45 tablet, Refills: 1   Associated Diagnoses: Hypotension, unspecified hypotension type    pantoprazole (PROTONIX) 40 MG tablet TAKE 1 TABLET BY MOUTH  DAILY Qty: 30 tablet, Refills: 3    potassium chloride (K-DUR) 10 MEQ tablet Take 1 tablet (10 mEq total) by mouth 2 (two) times daily. Qty: 60 tablet, Refills: 3    Probiotic Product (PROBIOTIC PO) Take 1 tablet by mouth daily.            Allergies  Allergen Reactions  . Penicillins Rash    Has patient had a PCN reaction causing immediate rash, facial/tongue/throat swelling, SOB or lightheadedness with hypotension: Yes Has patient had a PCN reaction causing severe rash involving mucus membranes or skin necrosis: No Has patient had a PCN reaction that required hospitalization No Has patient had a PCN reaction occurring within the last 10 years: No If all of the above answers are "NO", then may proceed with Cephalosporin use.       The results of significant diagnostics from this hospitalization (including imaging, microbiology, ancillary and laboratory) are listed below for reference.    Significant Diagnostic Studies: Ct Abdomen Pelvis Wo Contrast  05/21/2015  CLINICAL DATA:  Left-sided flank pain and generalized weakness. Cirrhosis. Stage 3 kidney disease. EXAM: CT ABDOMEN AND PELVIS WITHOUT CONTRAST TECHNIQUE: Multidetector CT imaging of the abdomen and pelvis was performed following the standard protocol without IV contrast. COMPARISON:  03/10/2015 FINDINGS: Lower chest: The lung bases are clear. No pleural or pericardial effusion noted. Hepatobiliary: A TIPS shunt is identified. Morphologic features of liver compatible with cirrhosis noted. No focal liver abnormality identified within the limitations of noncontrast thickening. Previous cholecystectomy. No biliary dilatation. Pancreas: No mass or inflammatory process identified on this un-enhanced exam. Spleen: The spleen is enlarged measuring 14 cm in length. Adrenals/Urinary Tract: Normal adrenal glands. No focal kidney abnormality identified. Urinary bladder appears normal for degree of distention. Stomach/Bowel: Normal appearance  of the stomach. Right lower quadrant ileostomy is identified. Enteric contrast material is identified throughout the small bowel loops up to the level of the ileostomy. Again noted is a ventral abdominal wall hernia which contains a loop of small bowel. This does not scratch set there does not appear to be a high-grade obstruction associated with this hernia at this time. Hartman's pouch is unremarkable. Vascular/Lymphatic: Normal appearance of the abdominal aorta. No enlarged retroperitoneal or mesenteric adenopathy. No enlarged pelvic or inguinal lymph nodes. Reproductive: No mass or other significant abnormality. Other: None. Musculoskeletal: Spondylosis noted within the lower thoracic and lumbar spine. IMPRESSION: 1. No acute findings and no explanation for patient's left flank pain. No kidney stones or ureteral calculi identified at this time. 2. Ventral abdominal wall hernia is again noted containing a nonobstructed loop of small bowel. 3. Right lower quadrant ileostomy. 4. 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 Electronically Signed   By: Signa Kell M.D.   On: 05/21/2015 17:17   Dg Chest 2 View  05/21/2015  CLINICAL DATA:  Weakness,  cirrhosis, kidney disease EXAM: CHEST  2 VIEW COMPARISON:  04/16/2015 FINDINGS: Cardiomediastinal silhouette is stable. No infiltrate or pleural effusion. No pulmonary edema. Bony thorax is unremarkable. IMPRESSION: No active cardiopulmonary disease. Electronically Signed   By: Natasha Mead M.D.   On: 05/21/2015 13:42    Microbiology: Recent Results (from the past 240 hour(s))  Microscopic Examination     Status: Abnormal   Collection Time: 05/19/15  3:08 PM  Result Value Ref Range Status   WBC, UA >30 (A) 0 -  5 /hpf Final   RBC, UA 3-10 (A) 0 -  2 /hpf Final   Epithelial Cells (non renal) >10 (A) 0 - 10 /hpf Final   Renal Epithel, UA >10 (A) None seen /hpf Final   Bacteria, UA Many (A) None seen/Few Final  Urine culture     Status: Abnormal (Preliminary result)    Collection Time: 05/21/15  1:34 PM  Result Value Ref Range Status   Specimen Description URINE, CLEAN CATCH  Final   Special Requests NONE  Final   Culture >=100,000 COLONIES/mL GRAM NEGATIVE RODS (A)  Final   Report Status PENDING  Incomplete  MRSA PCR Screening     Status: None   Collection Time: 05/21/15  9:35 PM  Result Value Ref Range Status   MRSA by PCR NEGATIVE NEGATIVE Final    Comment:        The GeneXpert MRSA Assay (FDA approved for NASAL specimens only), is one component of a comprehensive MRSA colonization surveillance program. It is not intended to diagnose MRSA infection nor to guide or monitor treatment for MRSA infections.      Labs: Basic Metabolic Panel:  Recent Labs Lab 05/19/15 1447 05/21/15 1305 05/22/15 0648 05/23/15 1147 05/24/15 0612  NA 126* 123* 126* 122* 124*  K 3.3* 2.7* 3.5 3.0* 4.0  CL 96 102 106 103 108  CO2 15* 15* 15* 15* 13*  GLUCOSE 110* 189* 143* 174* 125*  BUN 68* 73* 57* 47* 43*  CREATININE 2.55* 2.26* 1.69* 1.82* 1.87*  CALCIUM 8.9 8.2* 8.0* 7.8* 7.7*   Liver Function Tests:  Recent Labs Lab 05/19/15 1447 05/21/15 1305 05/22/15 0648  AST 31 26 23   ALT 21 20 17   ALKPHOS 222* 155* 128*  BILITOT 1.8* 2.0* 1.5*  PROT 5.6* 5.2* 4.5*  ALBUMIN 3.1* 2.6* 2.3*    Recent Labs Lab 05/21/15 1305  LIPASE 114*    Recent Labs Lab 05/19/15 1447 05/21/15 1305  AMMONIA 94* 54*   CBC:  Recent Labs Lab 05/19/15 1447 05/21/15 1305 05/22/15 0648  WBC 12.1* 9.3 5.8  NEUTROABS 8.2* 6.3  --   HGB  --  11.1* 9.7*  HCT 36.5 31.2* 27.2*  MCV 89 85.7 85.3  PLT 116* 90* 68*    Signed:  Houston Siren, MD.  [facility] 05/24/2015, 9:47 AM   By signing my name below, I, Adron Bene, attest that this documentation has been prepared under the direction and in the presence of Houston Siren, MD. Electronically Signed: Adron Bene, Scribe 05/24/2015 8:48am

## 2015-05-24 NOTE — Progress Notes (Signed)
Pt telemetry removed, reviewed discharge information with pt and answered all questions at this time.

## 2015-05-25 ENCOUNTER — Telehealth: Payer: Self-pay | Admitting: *Deleted

## 2015-05-25 NOTE — Telephone Encounter (Signed)
Call Completed and Appointment Scheduled: Yes, Date: 06/01/15 with [name], FNP\  DISCHARGE INFORMATION Date of Discharge:05/24/15  Discharge Facility: [facility]  Principal Discharge Diagnosis: UTI  Patient and/or caregiver is knowledgeable of his/her condition(s) and treatment: Yes  MEDICATION RECONCILIATION Medication list reviewed with patient:Yes Discharge Medications reconciled with home medications.   Patient is able to obtain needed medications:Yes  ACTIVITIES OF DAILY LIVING  Is the patient able to perform his/her own ADLs: Yes.    Patient is receiving home health services: No.  PATIENT EDUCATION Questions/Concerns Discussed: No questions or concerns at this time. Will call back if any arise.   Will need to have electrolytes checked at office visit.

## 2015-06-01 ENCOUNTER — Ambulatory Visit (INDEPENDENT_AMBULATORY_CARE_PROVIDER_SITE_OTHER): Payer: Medicare HMO | Admitting: Family

## 2015-06-01 ENCOUNTER — Encounter: Payer: Self-pay | Admitting: Family

## 2015-06-01 ENCOUNTER — Other Ambulatory Visit: Payer: Self-pay | Admitting: Family

## 2015-06-01 VITALS — BP 90/58 | HR 71 | Temp 97.0°F | Ht 65.0 in | Wt 197.6 lb

## 2015-06-01 DIAGNOSIS — I959 Hypotension, unspecified: Secondary | ICD-10-CM

## 2015-06-01 DIAGNOSIS — Z09 Encounter for follow-up examination after completed treatment for conditions other than malignant neoplasm: Secondary | ICD-10-CM

## 2015-06-01 DIAGNOSIS — E876 Hypokalemia: Secondary | ICD-10-CM

## 2015-06-01 DIAGNOSIS — N179 Acute kidney failure, unspecified: Secondary | ICD-10-CM

## 2015-06-01 DIAGNOSIS — E871 Hypo-osmolality and hyponatremia: Secondary | ICD-10-CM

## 2015-06-01 DIAGNOSIS — Z87440 Personal history of urinary (tract) infections: Secondary | ICD-10-CM

## 2015-06-01 LAB — MICROSCOPIC EXAMINATION: WBC, UA: >30 /hpf — AB (ref 0–?)

## 2015-06-01 LAB — URINALYSIS, COMPLETE
Bilirubin, UA: NEGATIVE
GLUCOSE, UA: NEGATIVE
KETONES UA: NEGATIVE
Nitrite, UA: NEGATIVE
SPEC GRAV UA: 1.015 (ref 1.005–1.030)
Urobilinogen, Ur: 0.2 mg/dL (ref 0.2–1.0)
pH, UA: 5.5 (ref 5.0–7.5)

## 2015-06-01 MED ORDER — SULFAMETHOXAZOLE-TRIMETHOPRIM 800-160 MG PO TABS: 1.0000 | ORAL_TABLET | Freq: Two times a day (BID) | ORAL | Status: DC

## 2015-06-01 NOTE — Patient Instructions (Signed)
Asymptomatic Bacteriuria, Female Asymptomatic bacteriuria is the presence of a large number of bacteria in your urine without the usual symptoms of burning or frequent urination. The following conditions increase the risk of asymptomatic bacteriuria:  Diabetes mellitus.  Advanced age.  Pregnancy in the first trimester.  Kidney stones.  Kidney transplants.  Leaky kidney tube valve in young children (reflux). Treatment for this condition is not needed in most people and can lead to other problems such as too much yeast and growth of resistant bacteria. However, some people, such as pregnant women, do need treatment to prevent kidney infection. Asymptomatic bacteriuria in pregnancy is also associated with fetal growth restriction, premature labor, and newborn death. HOME CARE INSTRUCTIONS Monitor your condition for any changes. The following actions may help to relieve any discomfort you are feeling:  Drink enough water and fluids to keep your urine clear or pale yellow. Go to the bathroom more often to keep your bladder empty.  Keep the area around your vagina and rectum clean. Wipe yourself from front to back after urinating. SEEK IMMEDIATE MEDICAL CARE IF:  You develop signs of an infection such as:  Burning with urination.  Frequency of voiding.  Back pain.  Fever.  You have blood in the urine.  You develop a fever. MAKE SURE YOU:  Understand these instructions.  Will watch your condition.  Will get help right away if you are not doing well or get worse.   This information is not intended to replace advice given to you by your health care provider. Make sure you discuss any questions you have with your health care provider.   Document Released: 01/24/2005 Document Revised: 02/14/2014 Document Reviewed: 07/16/2012 Elsevier Interactive Patient Education 2016 [employer]  

## 2015-06-01 NOTE — Progress Notes (Signed)
   Subjective:  c   Patient ID: Kathryn [name], female    DOB: Mar 02, 1955, 60 y.o.   MRN: [id number]  HPI Pt presents to the office today for Transition Care  Management appt. PT was discharged from [facility] for a UTI on 05/24/15 and was contacted by phone from RN on 05/25/15. PT states her UTI has resolved and no complaints of dysuria or hematuria.      Review of Systems  Constitutional: Negative.   HENT: Negative.   Eyes: Negative.   Respiratory: Negative.  Negative for shortness of breath.   Cardiovascular: Negative.  Negative for palpitations.  Gastrointestinal: Negative.   Endocrine: Negative.   Genitourinary: Negative.   Musculoskeletal: Negative.   Neurological: Negative.  Negative for headaches.  Hematological: Negative.   Psychiatric/Behavioral: Negative.   All other systems reviewed and are negative.      Objective:   Physical Exam  Constitutional: She is oriented to person, place, and time. She appears well-developed and well-nourished. No distress.  HENT:  Head: Normocephalic and atraumatic.  Eyes: Pupils are equal, round, and reactive to light.  Neck: Normal range of motion. Neck supple. No thyromegaly present.  Cardiovascular: Normal rate, regular rhythm, normal heart sounds and intact distal pulses.   No murmur heard. Pulmonary/Chest: Effort normal and breath sounds normal. No respiratory distress. She has no wheezes.  Abdominal: Soft. Bowel sounds are normal. She exhibits no distension. There is no tenderness.  Musculoskeletal: Normal range of motion. She exhibits no edema or tenderness.  Neurological: She is alert and oriented to person, place, and time.  Skin: Skin is warm and dry. There is pallor.  Psychiatric: She has a normal mood and affect. Her behavior is normal. Judgment and thought content normal.  Vitals reviewed.   BP 90/58 mmHg  Pulse 71  Temp(Src) 97 F (36.1 C) (Oral)  Ht '5\' 5"'$  (1.651 m)  Wt 197 lb 9.6 oz (89.631 kg)  BMI 32.88  kg/m2       Assessment & Plan:  1. History of UTI - Urinalysis, Complete - BMP8+EGFR  2. Hospital discharge follow-up - Urinalysis, Complete - BMP8+EGFR  3. Hypotension, unspecified hypotension type - BMP8+EGFR  4. AKI (acute kidney injury) ([place]) - BMP8+EGFR  5. Hyponatremia - BMP8+EGFR  6. Hypokalemia - BMP8+EGFR   Continue all meds Labs pending Health Maintenance reviewed- Pt encouraged to bring back FOBT Diet and exercise encouraged RTO as needed  [name], FNP

## 2015-06-02 ENCOUNTER — Other Ambulatory Visit: Payer: Self-pay | Admitting: Family

## 2015-06-02 DIAGNOSIS — E876 Hypokalemia: Secondary | ICD-10-CM

## 2015-06-02 DIAGNOSIS — R7989 Other specified abnormal findings of blood chemistry: Secondary | ICD-10-CM

## 2015-06-02 LAB — BMP8+EGFR
BUN / CREAT RATIO: 21 (ref 9–23)
BUN: 48 mg/dL — ABNORMAL HIGH (ref 6–24)
CO2: 14 mmol/L — AB (ref 18–29)
Calcium: 8.4 mg/dL — ABNORMAL LOW (ref 8.7–10.2)
Chloride: 101 mmol/L (ref 96–106)
Creatinine, Ser: 2.33 mg/dL — ABNORMAL HIGH (ref 0.57–1.00)
GFR, EST AFRICAN AMERICAN: 26 mL/min/{1.73_m2} — AB (ref >59)
GFR, EST NON AFRICAN AMERICAN: 22 mL/min/{1.73_m2} — AB (ref >59)
Glucose: 151 mg/dL — ABNORMAL HIGH (ref 65–99)
POTASSIUM: 2.7 mmol/L — AB (ref 3.5–5.2)
SODIUM: 129 mmol/L — AB (ref 134–144)

## 2015-06-02 MED ORDER — POTASSIUM CHLORIDE 20 MEQ PO PACK: 20.0000 meq | PACK | Freq: Every day | ORAL | Status: DC

## 2015-06-08 ENCOUNTER — Inpatient Hospital Stay (HOSPITAL_COMMUNITY)
Admission: EM | Admit: 2015-06-08 | Discharge: 2015-06-14 | DRG: 683 | Disposition: A | Discharge status: Home / Self Care | Payer: Medicare HMO | Attending: Internal Medicine | Admitting: Internal Medicine

## 2015-06-08 ENCOUNTER — Encounter (HOSPITAL_COMMUNITY): Payer: Self-pay | Admitting: Emergency Medicine

## 2015-06-08 ENCOUNTER — Emergency Department (HOSPITAL_COMMUNITY): Payer: Medicare HMO

## 2015-06-08 DIAGNOSIS — Z809 Family history of malignant neoplasm, unspecified: Secondary | ICD-10-CM

## 2015-06-08 DIAGNOSIS — D696 Thrombocytopenia, unspecified: Secondary | ICD-10-CM | POA: Diagnosis present

## 2015-06-08 DIAGNOSIS — I129 Hypertensive chronic kidney disease with stage 1 through stage 4 chronic kidney disease, or unspecified chronic kidney disease: Secondary | ICD-10-CM | POA: Diagnosis present

## 2015-06-08 DIAGNOSIS — K7460 Unspecified cirrhosis of liver: Secondary | ICD-10-CM | POA: Diagnosis present

## 2015-06-08 DIAGNOSIS — I959 Hypotension, unspecified: Secondary | ICD-10-CM | POA: Diagnosis present

## 2015-06-08 DIAGNOSIS — B1920 Unspecified viral hepatitis C without hepatic coma: Secondary | ICD-10-CM | POA: Diagnosis present

## 2015-06-08 DIAGNOSIS — N189 Chronic kidney disease, unspecified: Secondary | ICD-10-CM

## 2015-06-08 DIAGNOSIS — E860 Dehydration: Secondary | ICD-10-CM | POA: Diagnosis present

## 2015-06-08 DIAGNOSIS — Z823 Family history of stroke: Secondary | ICD-10-CM | POA: Diagnosis not present

## 2015-06-08 DIAGNOSIS — N183 Chronic kidney disease, stage 3 unspecified: Secondary | ICD-10-CM

## 2015-06-08 DIAGNOSIS — R894 Abnormal immunological findings in specimens from other organs, systems and tissues: Secondary | ICD-10-CM

## 2015-06-08 DIAGNOSIS — N184 Chronic kidney disease, stage 4 (severe): Secondary | ICD-10-CM | POA: Diagnosis present

## 2015-06-08 DIAGNOSIS — R768 Other specified abnormal immunological findings in serum: Secondary | ICD-10-CM | POA: Diagnosis present

## 2015-06-08 DIAGNOSIS — D638 Anemia in other chronic diseases classified elsewhere: Secondary | ICD-10-CM | POA: Diagnosis present

## 2015-06-08 DIAGNOSIS — E876 Hypokalemia: Secondary | ICD-10-CM | POA: Diagnosis present

## 2015-06-08 DIAGNOSIS — K5090 Crohn's disease, unspecified, without complications: Secondary | ICD-10-CM | POA: Diagnosis present

## 2015-06-08 DIAGNOSIS — Z95828 Presence of other vascular implants and grafts: Secondary | ICD-10-CM

## 2015-06-08 DIAGNOSIS — E871 Hypo-osmolality and hyponatremia: Secondary | ICD-10-CM | POA: Diagnosis present

## 2015-06-08 DIAGNOSIS — N179 Acute kidney failure, unspecified: Principal | ICD-10-CM

## 2015-06-08 LAB — URINALYSIS, ROUTINE W REFLEX MICROSCOPIC
BILIRUBIN URINE: NEGATIVE
Glucose, UA: NEGATIVE mg/dL
HGB URINE DIPSTICK: NEGATIVE
KETONES UR: NEGATIVE mg/dL
NITRITE: NEGATIVE
PH: 6 (ref 5.0–8.0)
Protein, ur: NEGATIVE mg/dL
SPECIFIC GRAVITY, URINE: 1.015 (ref 1.005–1.030)

## 2015-06-08 LAB — COMPREHENSIVE METABOLIC PANEL
ALBUMIN: 2.9 g/dL — AB (ref 3.5–5.0)
ALT: 22 U/L (ref 14–54)
AST: 29 U/L (ref 15–41)
Alkaline Phosphatase: 120 U/L (ref 38–126)
Anion gap: 7 (ref 5–15)
BUN: 68 mg/dL — AB (ref 6–20)
CHLORIDE: 105 mmol/L (ref 101–111)
CO2: 15 mmol/L — AB (ref 22–32)
Calcium: 8.9 mg/dL (ref 8.9–10.3)
Creatinine, Ser: 3.5 mg/dL — ABNORMAL HIGH (ref 0.44–1.00)
GFR calc Af Amer: 15 mL/min — ABNORMAL LOW (ref >60)
GFR, EST NON AFRICAN AMERICAN: 13 mL/min — AB (ref >60)
Glucose, Bld: 115 mg/dL — ABNORMAL HIGH (ref 65–99)
POTASSIUM: 3.8 mmol/L (ref 3.5–5.1)
SODIUM: 127 mmol/L — AB (ref 135–145)
Total Bilirubin: 1.2 mg/dL (ref 0.3–1.2)
Total Protein: 5.7 g/dL — ABNORMAL LOW (ref 6.5–8.1)

## 2015-06-08 LAB — CBC WITH DIFFERENTIAL/PLATELET
BASOS ABS: 0 10*3/uL (ref 0.0–0.1)
BASOS PCT: 0 %
EOS ABS: 0 10*3/uL (ref 0.0–0.7)
EOS PCT: 0 %
HCT: 30.4 % — ABNORMAL LOW (ref 36.0–46.0)
Hemoglobin: 10.8 g/dL — ABNORMAL LOW (ref 12.0–15.0)
Lymphocytes Relative: 25 %
Lymphs Abs: 2.3 10*3/uL (ref 0.7–4.0)
MCH: 30.6 pg (ref 26.0–34.0)
MCHC: 35.5 g/dL (ref 30.0–36.0)
MCV: 86.1 fL (ref 78.0–100.0)
MONO ABS: 0.5 10*3/uL (ref 0.1–1.0)
Monocytes Relative: 5 %
Neutro Abs: 6.6 10*3/uL (ref 1.7–7.7)
Neutrophils Relative %: 70 %
PLATELETS: 129 10*3/uL — AB (ref 150–400)
RBC: 3.53 MIL/uL — AB (ref 3.87–5.11)
RDW: 16.2 % — AB (ref 11.5–15.5)
WBC: 9.5 10*3/uL (ref 4.0–10.5)

## 2015-06-08 LAB — I-STAT CG4 LACTIC ACID, ED: LACTIC ACID, VENOUS: 0.91 mmol/L (ref 0.5–2.0)

## 2015-06-08 LAB — URINE MICROSCOPIC-ADD ON: RBC / HPF: NONE SEEN RBC/hpf (ref 0–5)

## 2015-06-08 LAB — AMMONIA: AMMONIA: 15 umol/L (ref 9–35)

## 2015-06-08 MED ORDER — SODIUM CHLORIDE 0.9 % IV SOLN: INTRAVENOUS | Status: AC

## 2015-06-08 MED ORDER — HEPARIN SODIUM (PORCINE) 5000 UNIT/ML IJ SOLN: 5000.0000 [IU] | Freq: Three times a day (TID) | INTRAMUSCULAR | Status: DC

## 2015-06-08 MED ORDER — PANTOPRAZOLE SODIUM 40 MG PO TBEC
40.0000 mg | DELAYED_RELEASE_TABLET | Freq: Every day | ORAL | Status: DC
  Administered 2015-06-09 – 2015-06-14 (×6): 40 mg via ORAL
  Filled 2015-06-08 (×6): qty 1

## 2015-06-08 MED ORDER — GABAPENTIN 100 MG PO CAPS
100.0000 mg | ORAL_CAPSULE | Freq: Three times a day (TID) | ORAL | Status: DC
  Administered 2015-06-08 – 2015-06-14 (×18): 100 mg via ORAL
  Filled 2015-06-08 (×18): qty 1

## 2015-06-08 MED ORDER — HEPARIN SODIUM (PORCINE) 5000 UNIT/ML IJ SOLN
5000.0000 [IU] | Freq: Three times a day (TID) | INTRAMUSCULAR | Status: DC
  Administered 2015-06-08 – 2015-06-10 (×5): 5000 [IU] via SUBCUTANEOUS
  Filled 2015-06-08 (×6): qty 1

## 2015-06-08 MED ORDER — NADOLOL 20 MG PO TABS: 10.0000 mg | ORAL_TABLET | Freq: Every day | ORAL | Status: DC

## 2015-06-08 MED ORDER — PANTOPRAZOLE SODIUM 40 MG PO TBEC: 40.0000 mg | DELAYED_RELEASE_TABLET | Freq: Every day | ORAL | Status: DC

## 2015-06-08 MED ORDER — ONDANSETRON HCL 4 MG/2ML IJ SOLN: 4.0000 mg | Freq: Four times a day (QID) | INTRAMUSCULAR | Status: DC | PRN

## 2015-06-08 MED ORDER — ONDANSETRON HCL 4 MG PO TABS: 4.0000 mg | ORAL_TABLET | Freq: Four times a day (QID) | ORAL | Status: DC | PRN

## 2015-06-08 MED ORDER — MESALAMINE 1000 MG RE SUPP
1000.0000 mg | Freq: Every day | RECTAL | Status: DC
  Administered 2015-06-08 – 2015-06-11 (×4): 1000 mg via RECTAL
  Filled 2015-06-08 (×7): qty 1

## 2015-06-08 MED ORDER — ALPRAZOLAM 1 MG PO TABS
1.0000 mg | ORAL_TABLET | Freq: Every evening | ORAL | Status: DC | PRN
  Administered 2015-06-08 – 2015-06-14 (×5): 1 mg via ORAL
  Filled 2015-06-08 (×2): qty 1
  Filled 2015-06-08: qty 2
  Filled 2015-06-08 (×3): qty 1

## 2015-06-08 MED ORDER — ONDANSETRON HCL 4 MG/2ML IJ SOLN: 4.0000 mg | Freq: Three times a day (TID) | INTRAMUSCULAR | Status: DC | PRN

## 2015-06-08 MED ORDER — SODIUM CHLORIDE 0.9 % IV SOLN
INTRAVENOUS | Status: DC
  Administered 2015-06-08 – 2015-06-14 (×10): via INTRAVENOUS

## 2015-06-08 MED ORDER — SODIUM CHLORIDE 0.9% FLUSH
3.0000 mL | Freq: Two times a day (BID) | INTRAVENOUS | Status: DC
  Administered 2015-06-08 – 2015-06-13 (×9): 3 mL via INTRAVENOUS

## 2015-06-08 MED ORDER — NADOLOL 20 MG PO TABS
10.0000 mg | ORAL_TABLET | Freq: Every day | ORAL | Status: DC
  Administered 2015-06-09 – 2015-06-13 (×2): 10 mg via ORAL
  Filled 2015-06-08 (×9): qty 1

## 2015-06-08 MED ORDER — LACTULOSE 10 GM/15ML PO SOLN
20.0000 g | Freq: Two times a day (BID) | ORAL | Status: DC
  Administered 2015-06-08 – 2015-06-14 (×12): 20 g via ORAL
  Filled 2015-06-08 (×12): qty 30

## 2015-06-08 MED ORDER — SODIUM CHLORIDE 0.9 % IV BOLUS (SEPSIS)
1000.0000 mL | INTRAVENOUS | Status: AC
  Administered 2015-06-08 (×3): 1000 mL via INTRAVENOUS

## 2015-06-08 NOTE — ED Provider Notes (Addendum)
CSN: [id number]     Arrival date & time 06/08/15  [id number] signing my name below, I, Freida Busman, attest that this documentation has been prepared under the direction and in the presence of Eber Hong, MD . Electronically Signed: Freida Busman, Scribe. 06/08/2015. 11:37 AM.    Chief Complaint  Patient presents with  . Weakness    The history is provided by the patient. No language interpreter was used.    HPI Comments:  Kathryn [name] is a 60 y.o. female with a history of Crohn's, Hep C, and cirrhosis who presents to the Emergency Department complaining of confusion this AM. Pt reports associated generalized weakness and  dizziness with position change. She denies room spinning sensation. Son notes h/o admission due to elevated ammonia levels. Pt denies abdominal swelling and h/o paracentesis. No alleviating factors noted. She also denies fever, chills, nausea, vomiting and blood in stool. She is currently on lactulose and has been complaint with med.   Sx are persistent, severe, nothing makes better, worse with standing. No fevers   Past Medical History  Diagnosis Date  . Hypertension   . Enteritis presumed infectious   . Crohn's   . Anemia   . Hepatitis C antibody test positive   . Hepatitis   . Renal insufficiency   . Cirrhosis (HCC)   . Splenomegaly   . Bilateral leg edema 01/2014  . Chronic back pain   . Thrombocytopenia [facility])    Past Surgical History  Procedure Laterality Date  . Colon surgery      MULTIPLE SURGERIES FOR CROHNS  . Ileostomy      multiple abdominal surgeries for Crohn's by Dr. Gabriel Cirri  . Cholecystectomy    . Abscess drainage      abdominal  . Esophagogastroduodenoscopy  10/05/2010    Procedure: ESOPHAGOGASTRODUODENOSCOPY (EGD);  Surgeon: [name], MD;  Location: [facility];  Service: Endoscopy;  Laterality: N/A;  7:30 am  . Cirrhosis    . Esophagogastroduodenoscopy N/A 09/11/2013    Procedure: ESOPHAGOGASTRODUODENOSCOPY (EGD);   Surgeon: [name], MD;  Location: [facility];  Service: Endoscopy;  Laterality: N/A;  . Ileoscopy  09/11/2013    Procedure: ILEOSCOPY THROUGH STOMA;  Surgeon: [name], MD;  Location: [facility];  Service: Endoscopy;;  . Givens capsule study N/A 09/12/2013    Procedure: GIVENS CAPSULE STUDY;  Surgeon: [name], MD;  Location: [facility];  Service: Endoscopy;  Laterality: N/A;  . Givens capsule study N/A 11/25/2013    Procedure: GIVENS CAPSULE STUDY;  Surgeon: [name], MD;  Location: [facility];  Service: Endoscopy;  Laterality: N/A;  . Varices cauterization Right     in the stoma of her ileostomy  . Tips procedure  11/2013    [facility]    Family History  Problem Relation Age of Onset  . Cancer Mother   . Stroke Father    Social History  Substance Use Topics  . Smoking status: Never Smoker   . Smokeless tobacco: Never Used  . Alcohol Use: No   OB History    Gravida Para Term Preterm AB TAB SAB Ectopic Multiple Living            1     Review of Systems  Constitutional: Negative for fever and chills.  Gastrointestinal: Negative for nausea, vomiting, blood in stool and abdominal distention.  Neurological: Positive for dizziness and weakness (generalized).  Psychiatric/Behavioral: Positive for confusion.  All  other systems reviewed and are negative.  Allergies  Penicillins  Home Medications   Prior to Admission medications   Medication Sig Start Date End Date Taking? Authorizing Provider  ALPRAZolam Prudy Feeler) 1 MG tablet TAKE 1 TABLET BY MOUTH AT BEDTIME AS NEEDED 05/05/15  Yes Junie Spencer, FNP  Cholecalciferol (VITAMIN D-1000 MAX ST) 1000 UNITS tablet Take 1,000 Units by mouth daily.   Yes Historical Provider, MD  feeding supplement, ENSURE ENLIVE, (ENSURE ENLIVE) LIQD Take 237 mLs by mouth daily. 03/27/15  Yes Standley Brooking, MD  furosemide (LASIX) 20 MG tablet Take 1 tablet (20 mg total) by mouth daily. 04/22/15  Yes Erick Blinks, MD   gabapentin (NEURONTIN) 100 MG capsule Take 1 capsule (100 mg total) by mouth 3 (three) times daily. 04/23/15  Yes Ernestina Penna, MD  lactulose (CHRONULAC) 10 GM/15ML solution Take 30 mLs (20 g total) by mouth 2 (two) times daily. 03/27/15  Yes Standley Brooking, MD  mesalamine (CANASA) 1000 MG suppository Place 1 suppository (1,000 mg total) rectally at bedtime. 04/22/15  Yes Erick Blinks, MD  Multiple Vitamins-Minerals (MULTIVITAMIN WITH MINERALS) tablet Take 1 tablet by mouth daily.   Yes Historical Provider, MD  nadolol (CORGARD) 20 MG tablet Take 0.5 tablets (10 mg total) by mouth daily. 05/19/15  Yes Junie Spencer, FNP  pantoprazole (PROTONIX) 40 MG tablet TAKE 1 TABLET BY MOUTH DAILY 06/09/14  Yes Len Blalock, NP  potassium chloride (KLOR-CON) 20 MEQ packet Take 20 mEq by mouth daily. 06/02/15  Yes Junie Spencer, FNP  Probiotic Product (PROBIOTIC PO) Take 1 tablet by mouth daily.   Yes Historical Provider, MD  spironolactone (ALDACTONE) 25 MG tablet Take 25 mg by mouth once.  05/31/15  Yes Historical Provider, MD  sulfamethoxazole-trimethoprim (BACTRIM DS) 800-160 MG tablet Take 1 tablet by mouth 2 (two) times daily. Patient not taking: Reported on 06/08/2015 06/01/15   Edilia Bo Hawks, FNP   BP 85/50 mmHg  Pulse 57  Temp(Src) 97.2 F (36.2 C) (Other (Comment))  Resp 15  Ht 5\' 5"  (1.651 m)  Wt 197 lb (89.359 kg)  BMI 32.78 kg/m2  SpO2 100% Physical Exam  Constitutional: She appears well-developed and well-nourished. No distress.  Appears generally weak   HENT:  Head: Normocephalic and atraumatic.  Mouth/Throat: Oropharynx is clear and moist. No oropharyngeal exudate.  Eyes: Conjunctivae and EOM are normal. Pupils are equal, round, and reactive to light. Right eye exhibits no discharge. Left eye exhibits no discharge. No scleral icterus.  Neck: Normal range of motion. Neck supple. No JVD present. No thyromegaly present.  Cardiovascular: Normal rate, regular rhythm, normal heart  sounds and intact distal pulses.  Exam reveals no gallop and no friction rub.   No murmur heard. Pulmonary/Chest: Effort normal and breath sounds normal. No respiratory distress. She has no wheezes. She has no rales.  Abdominal: Soft. Bowel sounds are normal. She exhibits no distension and no mass. There is no tenderness.  osotomy right mid abdomen with soft brown stool; no blood   Musculoskeletal: Normal range of motion. She exhibits no edema or tenderness.  Lymphadenopathy:    She has no cervical adenopathy.  Neurological: She is alert. Coordination normal.  No asterixis  Generalized weakness No lateralizing weakness  speech and memory clear  Skin: Skin is warm and dry. No rash noted. No erythema.  Psychiatric: She has a normal mood and affect. Her behavior is normal.  Nursing note and vitals reviewed.   ED Course  Procedures   DIAGNOSTIC STUDIES:  Oxygen Saturation is 98% on RA, normal by my interpretation.    COORDINATION OF CARE:  11:35 AM Discussed treatment plan with pt at bedside and pt agreed to plan.  Labs Review Labs Reviewed  CBC WITH DIFFERENTIAL/PLATELET - Abnormal; Notable for the following:    RBC 3.53 (*)    Hemoglobin 10.8 (*)    HCT 30.4 (*)    RDW 16.2 (*)    Platelets 129 (*)    All other components within normal limits  COMPREHENSIVE METABOLIC PANEL - Abnormal; Notable for the following:    Sodium 127 (*)    CO2 15 (*)    Glucose, Bld 115 (*)    BUN 68 (*)    Creatinine, Ser 3.50 (*)    Total Protein 5.7 (*)    Albumin 2.9 (*)    GFR calc non Af Amer 13 (*)    GFR calc Af Amer 15 (*)    All other components within normal limits  URINALYSIS, ROUTINE W REFLEX MICROSCOPIC (NOT AT [facility]) - Abnormal; Notable for the following:    Leukocytes, UA TRACE (*)    All other components within normal limits  URINE MICROSCOPIC-ADD ON - Abnormal; Notable for the following:    Squamous Epithelial / LPF TOO NUMEROUS TO COUNT (*)    Bacteria, UA MANY (*)     All other components within normal limits  CULTURE, BLOOD (ROUTINE X 2)  CULTURE, BLOOD (ROUTINE X 2)  URINE CULTURE  AMMONIA  I-STAT CG4 LACTIC ACID, ED  POC OCCULT BLOOD, ED    Imaging Review No results found. I have personally reviewed and evaluated these images and lab results as part of my medical decision-making.   EKG Interpretation   Date/Time:  Monday Jun 08 2015 11:22:21 EDT Ventricular Rate:  57 PR Interval:  155 QRS Duration: 94 QT Interval:  482 QTC Calculation: 469 R Axis:   62 Text Interpretation:  Sinus rhythm Baseline wander in lead(s) V2 since  last tracing no significant change Confirmed by Koray Soter  MD, Mikaeel Petrow ([id number])  on 06/08/2015 11:29:04 AM       Meds given in ED:  Medications  sodium chloride 0.9 % bolus 1,000 mL (1,000 mLs Intravenous New Bag/Given 06/08/15 1309)    New Prescriptions   No medications on file      MDM   Final diagnoses:  Hypotension, unspecified hypotension type  AKI (acute kidney injury) (HCC)    The patient is persistently hypotensive, this is significant with numbers as low as 79/53. I have started resuscitating the patient with multiple liters of IV fluids. She has not improved significantly however her mentation has stayed in a relatively stable range. There is no tachycardia or fever and her lactic acid is normal suggesting that this is not a sepsis picture. Her renal function has decreased and she does have acute on chronic renal failure. Urinalysis and chest x-ray do not delineate a source of infection. Blood cultures are pending.  I discussed the patient's care with the hospitalist who agrees to place the patient on the step down unit. She has had prednisone many many years ago but nothing recently, I do not suspect adrenal suppression though she may benefit from a trial of steroids. The patient does appear critically ill from her persistent hypotensive state.  CRITICAL CARE Performed by: Vida Roller Total critical  care time: 35 minutes Critical care time was exclusive of separately billable procedures and treating other patients.  Critical care was necessary to treat or prevent imminent or life-threatening deterioration. Critical care was time spent personally by me on the following activities: development of treatment plan with patient and/or surrogate as well as nursing, discussions with consultants, evaluation of patient's response to treatment, examination of patient, obtaining history from patient or surrogate, ordering and performing treatments and interventions, ordering and review of laboratory studies, ordering and review of radiographic studies, pulse oximetry and re-evaluation of patient's condition.   Eber Hong, MD 06/08/15 1326  Eber Hong, MD 06/08/15 [id number]

## 2015-06-08 NOTE — Progress Notes (Signed)
Pt hasn't voided since admission.  Next shift informed, and will monitor.

## 2015-06-08 NOTE — H&P (Signed)
History and Physical    DELPHINA LABRECQUE ZOX:[id number] DOB: [birth year] DOA: 06/08/2015  Referring MD/NP/PA: Dr. Hyacinth Meeker, EDP PCP: [name], FNP Outpatient Specialists:  Patient coming from: Home  Chief Complaint: Weakness  HPI: Kathryn [name] is a 60 y.o. female with medical history significant of hep C cirrhosis, Crohn's disease, CKD, Htn who presents to the emergency department with complaints of increasing weakness and dizziness on standing. Patient was recently discharged on 05/24/2015 for treatment of acute renal failure. She improved with hydration and was discharged home. Patient followed up on 06/01/2015 with her PCP, [name], where repeat blood work was obtained. Blood work did demonstrate a creatinine of 2.33 (was 1.87 on 05/24/2015).   ED Course: In the emergency department, patient noted to have a presenting creatinine of 3.50 with sodium of 127. Presenting blood pressure also noted to be 79/53. Ammonia was within normal limits at 15. Lactate was normal at 0.91. Patient was given 1 L normal saline bolus and hospitalist service consulted for consideration for admission.  Review of Systems:  Review of Systems  Constitutional: Positive for malaise/fatigue. Negative for fever and chills.  HENT: Negative for ear pain and tinnitus.   Eyes: Positive for blurred vision. Negative for pain and discharge.  Respiratory: Negative for hemoptysis and sputum production.   Cardiovascular: Negative for orthopnea and claudication.  Gastrointestinal: Negative for constipation and blood in stool.  Genitourinary: Negative for frequency and hematuria.  Musculoskeletal: Negative for back pain and falls.  Neurological: Positive for weakness. Negative for tingling and tremors.  Psychiatric/Behavioral: Negative for hallucinations. The patient is not nervous/anxious.      Past Medical History  Diagnosis Date  . Hypertension   . Enteritis presumed infectious   . Crohn's   . Anemia   .  Hepatitis C antibody test positive   . Hepatitis   . Renal insufficiency   . Cirrhosis (HCC)   . Splenomegaly   . Bilateral leg edema 01/2014  . Chronic back pain   . Thrombocytopenia [facility])     Past Surgical History  Procedure Laterality Date  . Colon surgery      MULTIPLE SURGERIES FOR CROHNS  . Ileostomy      multiple abdominal surgeries for Crohn's by Dr. Gabriel Cirri  . Cholecystectomy    . Abscess drainage      abdominal  . Esophagogastroduodenoscopy  10/05/2010    Procedure: ESOPHAGOGASTRODUODENOSCOPY (EGD);  Surgeon: [name], MD;  Location: [facility];  Service: Endoscopy;  Laterality: N/A;  7:30 am  . Cirrhosis    . Esophagogastroduodenoscopy N/A 09/11/2013    Procedure: ESOPHAGOGASTRODUODENOSCOPY (EGD);  Surgeon: [name], MD;  Location: [facility];  Service: Endoscopy;  Laterality: N/A;  . Ileoscopy  09/11/2013    Procedure: ILEOSCOPY THROUGH STOMA;  Surgeon: [name], MD;  Location: [facility];  Service: Endoscopy;;  . Givens capsule study N/A 09/12/2013    Procedure: GIVENS CAPSULE STUDY;  Surgeon: [name], MD;  Location: [facility];  Service: Endoscopy;  Laterality: N/A;  . Givens capsule study N/A 11/25/2013    Procedure: GIVENS CAPSULE STUDY;  Surgeon: [name], MD;  Location: [facility];  Service: Endoscopy;  Laterality: N/A;  . Varices cauterization Right     in the stoma of her ileostomy  . Tips procedure  11/2013    [facility]      reports that she has never smoked. She has never used smokeless tobacco. She reports that she  does not drink alcohol or use illicit drugs.  Allergies  Allergen Reactions  . Penicillins Rash    Has patient had a PCN reaction causing immediate rash, facial/tongue/throat swelling, SOB or lightheadedness with hypotension: Yes Has patient had a PCN reaction causing severe rash involving mucus membranes or skin necrosis: No Has patient had a PCN reaction that required hospitalization No Has patient  had a PCN reaction occurring within the last 10 years: No If all of the above answers are "NO", then may proceed with Cephalosporin use.     Family History  Problem Relation Age of Onset  . Cancer Mother   . Stroke Father     Prior to Admission medications   Medication Sig Start Date End Date Taking? Authorizing Provider  ALPRAZolam Prudy Feeler) 1 MG tablet TAKE 1 TABLET BY MOUTH AT BEDTIME AS NEEDED 05/05/15  Yes Junie Spencer, FNP  Cholecalciferol (VITAMIN D-1000 MAX ST) 1000 UNITS tablet Take 1,000 Units by mouth daily.   Yes Historical Provider, MD  feeding supplement, ENSURE ENLIVE, (ENSURE ENLIVE) LIQD Take 237 mLs by mouth daily. 03/27/15  Yes Standley Brooking, MD  furosemide (LASIX) 20 MG tablet Take 1 tablet (20 mg total) by mouth daily. 04/22/15  Yes Erick Blinks, MD  gabapentin (NEURONTIN) 100 MG capsule Take 1 capsule (100 mg total) by mouth 3 (three) times daily. 04/23/15  Yes Ernestina Penna, MD  lactulose (CHRONULAC) 10 GM/15ML solution Take 30 mLs (20 g total) by mouth 2 (two) times daily. 03/27/15  Yes Standley Brooking, MD  mesalamine (CANASA) 1000 MG suppository Place 1 suppository (1,000 mg total) rectally at bedtime. 04/22/15  Yes Erick Blinks, MD  Multiple Vitamins-Minerals (MULTIVITAMIN WITH MINERALS) tablet Take 1 tablet by mouth daily.   Yes Historical Provider, MD  nadolol (CORGARD) 20 MG tablet Take 0.5 tablets (10 mg total) by mouth daily. 05/19/15  Yes Junie Spencer, FNP  pantoprazole (PROTONIX) 40 MG tablet TAKE 1 TABLET BY MOUTH DAILY 06/09/14  Yes Len Blalock, NP  potassium chloride (KLOR-CON) 20 MEQ packet Take 20 mEq by mouth daily. 06/02/15  Yes Junie Spencer, FNP  Probiotic Product (PROBIOTIC PO) Take 1 tablet by mouth daily.   Yes Historical Provider, MD  spironolactone (ALDACTONE) 25 MG tablet Take 25 mg by mouth once.  05/31/15  Yes Historical Provider, MD  sulfamethoxazole-trimethoprim (BACTRIM DS) 800-160 MG tablet Take 1 tablet by mouth 2 (two) times  daily. Patient not taking: Reported on 06/08/2015 06/01/15   Junie Spencer, FNP    Physical Exam: Filed Vitals:   06/08/15 1153 06/08/15 1200 06/08/15 1222 06/08/15 1300  BP: 79/53 80/43  85/50  Pulse: 59 57  57  Temp: 97.2 F (36.2 C)  97.2 F (36.2 C)   TempSrc: Rectal  Other (Comment)   Resp: 20 16  15   Height:      Weight:      SpO2: 100% 100%  100%      Constitutional: NAD, calm, comfortable Filed Vitals:   06/08/15 1153 06/08/15 1200 06/08/15 1222 06/08/15 1300  BP: 79/53 80/43  85/50  Pulse: 59 57  57  Temp: 97.2 F (36.2 C)  97.2 F (36.2 C)   TempSrc: Rectal  Other (Comment)   Resp: 20 16  15   Height:      Weight:      SpO2: 100% 100%  100%   Eyes: PERRL, lids and conjunctivae normal ENMT: Mucous membranes Dry. Posterior pharynx clear of any exudate or  lesions.Normal dentition.  Neck: normal, supple, no masses, no thyromegaly Respiratory: clear to auscultation bilaterally. Normal respiratory effort. No accessory muscle use.  Cardiovascular: Regular rate and rhythm, no murmurs / rubs / gallops. Abdomen: no tenderness, no masses palpated. Bowel sounds positive.  Musculoskeletal: no clubbing / cyanosis. No joint deformity upper and lower extremities. Good ROM, no contractures. Normal muscle tone.  Skin: no rashes, lesions, ulcers. No induration Neurologic: CN 2-12 grossly intact. Sensation intact, DTR normal. Strength 5/5 in all 4.  Psychiatric: Alert . Normal mood.    Labs on Admission: I have personally reviewed following labs and imaging studies  CBC:  Recent Labs Lab 06/08/15 1130  WBC 9.5  NEUTROABS 6.6  HGB 10.8*  HCT 30.4*  MCV 86.1  PLT 129*   Basic Metabolic Panel:  Recent Labs Lab 06/01/15 1435 06/08/15 1130  NA 129* 127*  K 2.7* 3.8  CL 101 105  CO2 14* 15*  GLUCOSE 151* 115*  BUN 48* 68*  CREATININE 2.33* 3.50*  CALCIUM 8.4* 8.9   GFR: Estimated Creatinine Clearance: 19.1 mL/min (by C-G formula based on Cr of 3.5). Liver  Function Tests:  Recent Labs Lab 06/08/15 1130  AST 29  ALT 22  ALKPHOS 120  BILITOT 1.2  PROT 5.7*  ALBUMIN 2.9*   No results for input(s): LIPASE, AMYLASE in the last 168 hours.  Recent Labs Lab 06/08/15 1130  AMMONIA 15   Coagulation Profile: No results for input(s): INR, PROTIME in the last 168 hours. Cardiac Enzymes: No results for input(s): CKTOTAL, CKMB, CKMBINDEX, TROPONINI in the last 168 hours. BNP (last 3 results) No results for input(s): PROBNP in the last 8760 hours. HbA1C: No results for input(s): HGBA1C in the last 72 hours. CBG: No results for input(s): GLUCAP in the last 168 hours. Lipid Profile: No results for input(s): CHOL, HDL, LDLCALC, TRIG, CHOLHDL, LDLDIRECT in the last 72 hours. Thyroid Function Tests: No results for input(s): TSH, T4TOTAL, FREET4, T3FREE, THYROIDAB in the last 72 hours. Anemia Panel: No results for input(s): VITAMINB12, FOLATE, FERRITIN, TIBC, IRON, RETICCTPCT in the last 72 hours. Urine analysis:    Component Value Date/Time   COLORURINE YELLOW 06/08/2015 1146   APPEARANCEUR CLEAR 06/08/2015 1146   APPEARANCEUR Hazy* 06/01/2015 1450   LABSPEC 1.015 06/08/2015 1146   PHURINE 6.0 06/08/2015 1146   GLUCOSEU NEGATIVE 06/08/2015 1146   HGBUR NEGATIVE 06/08/2015 1146   BILIRUBINUR NEGATIVE 06/08/2015 1146   BILIRUBINUR Negative 06/01/2015 1450   BILIRUBINUR negative 02/23/2015 1751   KETONESUR NEGATIVE 06/08/2015 1146   PROTEINUR NEGATIVE 06/08/2015 1146   PROTEINUR Trace 06/01/2015 1450   PROTEINUR 2+ 02/23/2015 1751   UROBILINOGEN negative 02/23/2015 1751   UROBILINOGEN 0.2 06/09/2014 0545   NITRITE NEGATIVE 06/08/2015 1146   NITRITE Negative 06/01/2015 1450   NITRITE negative 02/23/2015 1751   LEUKOCYTESUR TRACE* 06/08/2015 1146   LEUKOCYTESUR 3+* 06/01/2015 1450   Sepsis Labs: @LABRCNTIP (procalcitonin:4,lacticidven:4) ) Recent Results (from the past 240 hour(s))  Microscopic Examination     Status: Abnormal    Collection Time: 06/01/15  2:50 PM  Result Value Ref Range Status   WBC, UA >30 (A) 0 -  5 /hpf Final   RBC, UA 3-10 (A) 0 -  2 /hpf Final   Epithelial Cells (non renal) 0-10 0 - 10 /hpf Final   Bacteria, UA Few None seen/Few Final     Radiological Exams on Admission: Dg Chest Port 1 View  06/08/2015  CLINICAL DATA:  Generalized weakness and nausea for 2  days. Initial encounter. EXAM: PORTABLE CHEST 1 VIEW COMPARISON:  PA and lateral chest 05/21/2015. Single view of the chest 04/16/2015. FINDINGS: The lungs are clear. Heart size is normal. No pneumothorax or pleural effusion. No focal bony abnormality. IMPRESSION: Negative chest. Electronically Signed   By: Drusilla Kanner M.D.   On: 06/08/2015 13:25    Assessment/Plan Principal Problem:   Acute on chronic renal failure (HCC) Active Problems:   Crohn's disease (HCC)   Hepatic cirrhosis (HCC)   Hepatitis C antibody test positive   Hypotension   Acute on chronic renal failure -Suspect dehydration with concurrent diuretic use -For now, would hold Lasix. -Would continue on IV fluid hydration as tolerated -Follow renal function closely -Given presenting hypertension, will admit patient to stepdown for close monitoring  Crohn's disease -Seems stable at present -Would continue to monitor  Hep C cirrhosis -Liver function profile is unremarkable -Ammonia levels are within normal limits  Hypotension -Suspect secondary to dehydration.  -Continue IV fluid hydration as tolerated.  -Continue monitor closely in stepdown.  Hyponatremia -Suspect related to dehydration -Continue normal saline as per above -Follow electrolytes closely   DVT prophylaxis: Heparin subcutaneous Code Status: Full Family Communication: Patient in room  Disposition Plan: Uncertain at this time Consults called: None Admission status: Admit inpatient stepdown   Zanaya Baize, Scheryl Marten MD [facility] Pager [phone]  If 7PM-7AM, please contact  night-coverage [email or web address] Password [email or web address]  06/08/2015, 1:34 PM

## 2015-06-08 NOTE — ED Notes (Addendum)
Pt reports generalized weakness and nausea x 2 days. States she thinks her sodium is low. AOx4. Pt hypotensive in triage.

## 2015-06-09 LAB — COMPREHENSIVE METABOLIC PANEL
ALBUMIN: 2.2 g/dL — AB (ref 3.5–5.0)
ALK PHOS: 99 U/L (ref 38–126)
ALT: 17 U/L (ref 14–54)
AST: 26 U/L (ref 15–41)
Anion gap: 6 (ref 5–15)
BILIRUBIN TOTAL: 0.7 mg/dL (ref 0.3–1.2)
BUN: 60 mg/dL — AB (ref 6–20)
CALCIUM: 7.8 mg/dL — AB (ref 8.9–10.3)
CO2: 11 mmol/L — ABNORMAL LOW (ref 22–32)
CREATININE: 2.97 mg/dL — AB (ref 0.44–1.00)
Chloride: 111 mmol/L (ref 101–111)
GFR calc Af Amer: 19 mL/min — ABNORMAL LOW (ref >60)
GFR calc non Af Amer: 16 mL/min — ABNORMAL LOW (ref >60)
GLUCOSE: 95 mg/dL (ref 65–99)
Potassium: 3.6 mmol/L (ref 3.5–5.1)
Sodium: 128 mmol/L — ABNORMAL LOW (ref 135–145)
TOTAL PROTEIN: 4.4 g/dL — AB (ref 6.5–8.1)

## 2015-06-09 LAB — CBC
HCT: 25.7 % — ABNORMAL LOW (ref 36.0–46.0)
Hemoglobin: 9 g/dL — ABNORMAL LOW (ref 12.0–15.0)
MCH: 30.7 pg (ref 26.0–34.0)
MCHC: 35 g/dL (ref 30.0–36.0)
MCV: 87.7 fL (ref 78.0–100.0)
Platelets: 91 10*3/uL — ABNORMAL LOW (ref 150–400)
RBC: 2.93 MIL/uL — ABNORMAL LOW (ref 3.87–5.11)
RDW: 16.4 % — AB (ref 11.5–15.5)
WBC: 7.7 10*3/uL (ref 4.0–10.5)

## 2015-06-09 LAB — OCCULT BLOOD, POC DEVICE: FECAL OCCULT BLD: NEGATIVE

## 2015-06-09 MED ORDER — GI COCKTAIL ~~LOC~~
30.0000 mL | Freq: Once | ORAL | Status: AC
  Administered 2015-06-09: 30 mL via ORAL
  Filled 2015-06-09: qty 30

## 2015-06-09 MED ORDER — ENSURE ENLIVE PO LIQD
237.0000 mL | Freq: Two times a day (BID) | ORAL | Status: DC
  Administered 2015-06-10 – 2015-06-14 (×8): 237 mL via ORAL

## 2015-06-09 NOTE — Progress Notes (Signed)
Bladder scan 0 cc. Pt voided 200 cc afterwards. RN will continue to monitor. Lesly Dukes, RN

## 2015-06-09 NOTE — Progress Notes (Signed)
PROGRESS NOTE    Kathryn [name]  JWJ:[id number] DOB: 06-07-1955 DOA: 06/08/2015 PCP: [name], FNP  Outpatient Specialists:     Brief Narrative:  60 y.o. female with medical history significant of hep C cirrhosis, Crohn's disease, CKD, Htn who presents to the emergency department with complaints of increasing weakness and dizziness on standing. Patient was recently discharged on 05/24/2015 for treatment of acute renal failure. She improved with hydration and was discharged home. Patient followed up on 06/01/2015 with her PCP, [name], where repeat blood work was obtained. Blood work did demonstrate a creatinine of 2.33 (was 1.87 on 05/24/2015).    Assessment & Plan:   Principal Problem:   Acute on chronic renal failure (HCC) Active Problems:   Crohn's disease (HCC)   Hepatic cirrhosis (HCC)   Hepatitis C antibody test positive   Hypotension   ARF (acute renal failure) (HCC)   Acute on chronic renal failure -Baseline creatinine of 1.5-2. Patient presented with a creatinine of 3.5. -Suspect dehydration with concurrent diuretic use -Continue to hold Lasix -Would continue on IV fluid hydration as tolerated -Patient's creatinine has improved to 2.97 this morning. For now, will continue IV fluids as tolerated.  Crohn disease -Seems stable at present -Would continue to monitor  Hep C cirrhosis -Liver function profile is unremarkable -Ammonia levels are within normal limits  Hypotension -Presenting systolic blood pressure noted to be around 80 on admission. -Suspect secondary to dehydration.  -Blood pressure has improved with IV fluids overnight  Hyponatremia -Suspect related to dehydration -Continue normal saline as per above -Improved this morning   DVT prophylaxis: Heparin subcutaneous Code Status: Full Family Communication: Patient in room, family is not present Disposition Plan: Possible DC home in 24-48 hours   Consultants:     Procedures:      Antimicrobials:       Subjective: Reports feeling much better this morning. Patient is asking about going home  Objective: Filed Vitals:   06/09/15 0748 06/09/15 0800 06/09/15 0900 06/09/15 1000  BP:  109/59 99/54 99/54   Pulse:  73 66 67  Temp: 97.6 F (36.4 C)     TempSrc: Oral     Resp:  Height:      Weight:      SpO2:  100% 100% 100%    Intake/Output Summary (Last 24 hours) at 06/09/15 1153 Last data filed at 06/09/15 1000  Gross per 24 hour  Intake 2976.67 ml  Output   1463 ml  Net 1513.67 ml   Filed Weights   06/08/15 1112 06/08/15 1400 06/09/15 0500  Weight: 89.359 kg (197 lb) 88.1 kg (194 lb 3.6 oz) 92.5 kg (203 lb 14.8 oz)    Examination:  General exam: Appears calm and comfortable  Respiratory system: Clear to auscultation. Respiratory effort normal. Cardiovascular system: S1 & S2 heard, RRR.Marland Kitchen No pedal edema. Gastrointestinal system: Abdomen is nondistended, soft and nontender. No organomegaly or masses felt. Normal bowel sounds heard. Central nervous system: Alert and oriented. No focal neurological deficits. Extremities: Symmetric 5 x 5 power. Skin: No rashes, lesions Psychiatry: Judgement and insight appear normal. Mood & affect appropriate.     Data Reviewed: I have personally reviewed following labs and imaging studies  CBC:  Recent Labs Lab 06/08/15 1130 06/09/15 0517  WBC 9.5 7.7  NEUTROABS 6.6  --   HGB 10.8* 9.0*  HCT 30.4* 25.7*  MCV 86.1 87.7  PLT 129* 91*   Basic Metabolic Panel:  Recent Labs Lab 06/08/15  1130 06/09/15 0517  NA 127* 128*  K 3.8 3.6  CL 105 111  CO2 15* 11*  GLUCOSE 115* 95  BUN 68* 60*  CREATININE 3.50* 2.97*  CALCIUM 8.9 7.8*   GFR: Estimated Creatinine Clearance: 22.9 mL/min (by C-G formula based on Cr of 2.97). Liver Function Tests:  Recent Labs Lab 06/08/15 1130 06/09/15 0517  AST 29 26  ALT 22 17  ALKPHOS 120 99  BILITOT 1.2 0.7  PROT 5.7* 4.4*  ALBUMIN 2.9* 2.2*    No results for input(s): LIPASE, AMYLASE in the last 168 hours.  Recent Labs Lab 06/08/15 1130  AMMONIA 15   Coagulation Profile: No results for input(s): INR, PROTIME in the last 168 hours. Cardiac Enzymes: No results for input(s): CKTOTAL, CKMB, CKMBINDEX, TROPONINI in the last 168 hours. BNP (last 3 results) No results for input(s): PROBNP in the last 8760 hours. HbA1C: No results for input(s): HGBA1C in the last 72 hours. CBG: No results for input(s): GLUCAP in the last 168 hours. Lipid Profile: No results for input(s): CHOL, HDL, LDLCALC, TRIG, CHOLHDL, LDLDIRECT in the last 72 hours. Thyroid Function Tests: No results for input(s): TSH, T4TOTAL, FREET4, T3FREE, THYROIDAB in the last 72 hours. Anemia Panel: No results for input(s): VITAMINB12, FOLATE, FERRITIN, TIBC, IRON, RETICCTPCT in the last 72 hours. Urine analysis:    Component Value Date/Time   COLORURINE YELLOW 06/08/2015 1146   APPEARANCEUR CLEAR 06/08/2015 1146   APPEARANCEUR Hazy* 06/01/2015 1450   LABSPEC 1.015 06/08/2015 1146   PHURINE 6.0 06/08/2015 1146   GLUCOSEU NEGATIVE 06/08/2015 1146   HGBUR NEGATIVE 06/08/2015 1146   BILIRUBINUR NEGATIVE 06/08/2015 1146   BILIRUBINUR Negative 06/01/2015 1450   BILIRUBINUR negative 02/23/2015 1751   KETONESUR NEGATIVE 06/08/2015 1146   PROTEINUR NEGATIVE 06/08/2015 1146   PROTEINUR Trace 06/01/2015 1450   PROTEINUR 2+ 02/23/2015 1751   UROBILINOGEN negative 02/23/2015 1751   UROBILINOGEN 0.2 06/09/2014 0545   NITRITE NEGATIVE 06/08/2015 1146   NITRITE Negative 06/01/2015 1450   NITRITE negative 02/23/2015 1751   LEUKOCYTESUR TRACE* 06/08/2015 1146   LEUKOCYTESUR 3+* 06/01/2015 1450   Sepsis Labs: @LABRCNTIP (procalcitonin:4,lacticidven:4)  ) Recent Results (from the past 240 hour(s))  Microscopic Examination     Status: Abnormal   Collection Time: 06/01/15  2:50 PM  Result Value Ref Range Status   WBC, UA >30 (A) 0 -  5 /hpf Final   RBC, UA 3-10 (A)  0 -  2 /hpf Final   Epithelial Cells (non renal) 0-10 0 - 10 /hpf Final   Bacteria, UA Few None seen/Few Final  Urine culture     Status: None (Preliminary result)   Collection Time: 06/08/15 11:46 AM  Result Value Ref Range Status   Specimen Description URINE, CATHETERIZED  Final   Special Requests NONE  Final   Culture   Final    NO GROWTH < 24 HOURS Performed at [facility]    Report Status PENDING  Incomplete  Blood Culture (routine x 2)     Status: None (Preliminary result)   Collection Time: 06/08/15 12:00 PM  Result Value Ref Range Status   Specimen Description BLOOD  Final   Special Requests NONE  Final   Culture NO GROWTH < 24 HOURS  Final   Report Status PENDING  Incomplete  Blood Culture (routine x 2)     Status: None (Preliminary result)   Collection Time: 06/08/15 12:05 PM  Result Value Ref Range Status   Specimen Description BLOOD  Final  Special Requests NONE  Final   Culture NO GROWTH < 24 HOURS  Final   Report Status PENDING  Incomplete         Radiology Studies: Dg Chest Port 1 View  06/08/2015  CLINICAL DATA:  Generalized weakness and nausea for 2 days. Initial encounter. EXAM: PORTABLE CHEST 1 VIEW COMPARISON:  PA and lateral chest 05/21/2015. Single view of the chest 04/16/2015. FINDINGS: The lungs are clear. Heart size is normal. No pneumothorax or pleural effusion. No focal bony abnormality. IMPRESSION: Negative chest. Electronically Signed   By: Drusilla Kanner M.D.   On: 06/08/2015 13:25        Scheduled Meds: . sodium chloride   Intravenous STAT  . gabapentin  100 mg Oral TID  . heparin  5,000 Units Subcutaneous Q8H  . lactulose  20 g Oral BID  . mesalamine  1,000 mg Rectal QHS  . nadolol  10 mg Oral Daily  . pantoprazole  40 mg Oral Daily  . sodium chloride flush  3 mL Intravenous Q12H   Continuous Infusions: . sodium chloride 100 mL/hr at 06/09/15 1000     LOS: 1 day      CHIU, Scheryl Marten, MD [facility] Pager  [phone]  If 7PM-7AM, please contact night-coverage [email or web address] Password [email or web address] 06/09/2015, 11:53 AM

## 2015-06-09 NOTE — Progress Notes (Signed)
Received report from [facility], RN regarding patient coming to rm 302. Lesly Dukes, RN

## 2015-06-09 NOTE — Progress Notes (Signed)
Called report to Fleet Contras, RN on dept 300. Verbalized understanding. Pt transferred to room 302 in safe and stable condition.

## 2015-06-09 NOTE — Progress Notes (Signed)
Initial Nutrition Assessment  DOCUMENTATION CODES:   Obesity unspecified  INTERVENTION:  Ensure Enlive po BID, each supplement provides 350 kcal and 20 grams of protein. Pt prefers vanilla flavor.  Offer snack q hs from nourishment room   NUTRITION DIAGNOSIS:  Increased nutrient needs related to chronic illness as evidenced by estimated needs.   GOAL:   Patient will meet greater than or equal to 90% of their needs   MONITOR:   PO intake, Labs, Weight trends  REASON FOR ASSESSMENT:   Malnutrition Screening Tool    ASSESSMENT:  Pt is a 60 yo female with hx which includes:hep C cirrhosis, Crohn's disease, CKD, Htn who presents to the emergency department with complaints of increasing weakness and dizziness on standing. Patient was recently discharged on 05/24/2015 for treatment of acute renal failure. She improved with hydration and was discharged home. Patient followed up on 06/01/2015 with her PCP, [name], where repeat blood work was obtained. Blood work did demonstrate a creatinine of 2.33 (was 1.87 on 05/24/2015).   Pt says her appetite is fair which is also refected in her current meal intake 50%. She says her intake has not been as good lately because she just hasn't "felt like eating". Suprisingly her weight is up (3%) since recent discharge. Likely her bilateral lower extremity edema is contributing.  She is s/p ileostomy in 2015 per pt. At home she follows a regular diet but avoids nuts. She drinks an Ensure Enlive daily and takes a multivitamin, vitamin D3, and a probiotic daily.  Abnormal labs: sodium 128, BUN 60, Cr 2.97, GFR-16.  H/H 9.0/25.7 (hx of anemia)  Chest x-ray 5/1-lungs are clear  Diet Order:  Diet Heart Room service appropriate?: Yes; Fluid consistency:: Thin  Skin:    intact  Last BM:   5/2 ostomy RLQ  Height:   Ht Readings from Last 1 Encounters:  06/08/15 5\' 5"  (1.651 m)    Weight:   Wt Readings from Last 1 Encounters:  06/09/15  203 lb 14.8 oz (92.5 kg)    Ideal Body Weight:  57 kg  BMI:  Body mass index is 33.93 kg/(m^2).  Estimated Nutritional Needs:   Kcal:  1509 -1700   Protein:  103 gr   Fluid:  1500 ml daily  EDUCATION NEEDS:   Education needs no appropriate at this time  Royann Shivers MS,RD,CSG,LDN Office: [phone] Pager: [phone]

## 2015-06-10 ENCOUNTER — Inpatient Hospital Stay (HOSPITAL_COMMUNITY): Payer: Medicare HMO

## 2015-06-10 ENCOUNTER — Telehealth (INDEPENDENT_AMBULATORY_CARE_PROVIDER_SITE_OTHER): Payer: Self-pay | Admitting: Internal Medicine

## 2015-06-10 DIAGNOSIS — N183 Chronic kidney disease, stage 3 unspecified: Secondary | ICD-10-CM

## 2015-06-10 LAB — BASIC METABOLIC PANEL
ANION GAP: 5 (ref 5–15)
BUN: 62 mg/dL — AB (ref 6–20)
CO2: 13 mmol/L — ABNORMAL LOW (ref 22–32)
Calcium: 8.1 mg/dL — ABNORMAL LOW (ref 8.9–10.3)
Chloride: 109 mmol/L (ref 101–111)
Creatinine, Ser: 2.81 mg/dL — ABNORMAL HIGH (ref 0.44–1.00)
GFR, EST AFRICAN AMERICAN: 20 mL/min — AB (ref >60)
GFR, EST NON AFRICAN AMERICAN: 17 mL/min — AB (ref >60)
Glucose, Bld: 98 mg/dL (ref 65–99)
POTASSIUM: 3.2 mmol/L — AB (ref 3.5–5.1)
SODIUM: 127 mmol/L — AB (ref 135–145)

## 2015-06-10 LAB — URINE CULTURE

## 2015-06-10 LAB — MAGNESIUM: MAGNESIUM: 1.9 mg/dL (ref 1.7–2.4)

## 2015-06-10 MED ORDER — POTASSIUM CHLORIDE CRYS ER 20 MEQ PO TBCR
40.0000 meq | EXTENDED_RELEASE_TABLET | Freq: Once | ORAL | Status: AC
  Administered 2015-06-10: 40 meq via ORAL
  Filled 2015-06-10: qty 2

## 2015-06-10 MED ORDER — TRAMADOL HCL 50 MG PO TABS
50.0000 mg | ORAL_TABLET | Freq: Four times a day (QID) | ORAL | Status: DC | PRN
  Administered 2015-06-10 – 2015-06-14 (×3): 50 mg via ORAL
  Filled 2015-06-10 (×3): qty 1

## 2015-06-10 MED ORDER — ALUM & MAG HYDROXIDE-SIMETH 200-200-20 MG/5ML PO SUSP
15.0000 mL | Freq: Four times a day (QID) | ORAL | Status: DC | PRN
  Administered 2015-06-10: 15 mL via ORAL
  Filled 2015-06-10: qty 30

## 2015-06-10 NOTE — Care Management Note (Signed)
Case Management Note  Patient Details  Name: Kathryn [name] MRN: [id number] Date of Birth: [birth year]  Subjective/Objective:                  Pt admitted with acute/chronic renal failure. Pt lives with husband and is ind with ADL's. Pt has a cane at home but does not need it for ambulation. Pt has PCP, transportation and no difficulty affording meds.   Action/Plan: No CM needs anticipated.   Expected Discharge Date:    06/11/2015              Expected Discharge Plan:  Home/Self Care  In-House Referral:  NA  Discharge planning Services  CM Consult  Post Acute Care Choice:  NA Choice offered to:  NA  DME Arranged:    DME Agency:     HH Arranged:    HH Agency:     Status of Service:  Completed, signed off  Medicare Important Message Given:    Date Medicare IM Given:    Medicare IM give by:    Date Additional Medicare IM Given:    Additional Medicare Important Message give by:     If discussed at Long Length of Stay Meetings, dates discussed:    Additional Comments:  Malcolm Metro, RN 06/10/2015, 12:41 PM

## 2015-06-10 NOTE — Clinical Documentation Improvement (Signed)
Hospitalist Please update your documentation within the medical record to reflect your response to this query. Thank you  Can the diagnosis of CKD be further specified?   CKD Stage I - GFR greater than or equal to 90  CKD Stage II - GFR 60-89  CKD Stage III - GFR 30-59  CKD Stage IV - GFR 15-29  CKD Stage V - GFR < 15  ESRD (End Stage Renal Disease)  Other condition  Unable to clinically determine  Supporting Information: : (risk factors, signs and symptoms, diagnostics, treatment) 06/09/15 progr note.Marland KitchenMarland Kitchen"Acute on chronic renal failure "..."Continue to hold Lasix"..."For now, will continue IV fluids as tolerated.".Marland KitchenMarland Kitchen Results for Kathryn [name], Kathryn [name] (MRN [id number]) as of 06/10/2015 09:11  06/08/2015 11:30 06/09/2015 05:17 06/10/2015 06:18  BUN 68 (H) 60 (H) 62 (H)  Creatinine 3.50 (H) 2.97 (H) 2.81 (H)  EGFR (Non-African Amer.) 13 (L) 16 (L) 17 (L)   Please exercise your independent, professional judgment when responding. A specific answer is not anticipated or expected.  Thank You, Ermelinda Das, RN, BSN, [place] Certified Clinical Documentation Specialist [place]: Health Information Management [phone]

## 2015-06-10 NOTE — Progress Notes (Signed)
Lost iv access. Patient a difficult stick. Attempts to restart iv were unsuccessful. Paged hospitalist to notify of this. Says it is okay to leave out for now but have 1st shift hospitalist address. Will pass this on to oncoming shift.

## 2015-06-10 NOTE — Progress Notes (Signed)
PROGRESS NOTE    Kathryn [name]  BXU:[id number] DOB: 04/02/1955 DOA: 06/08/2015 PCP: [name], FNP     Brief Narrative:  60 year old woman admitted to the hospital 5/1 with increasing weakness and dizziness upon standing. She was found to be in acute renal failure with a creatinine of 3.5 and admission was requested.   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 -Plan to continue IV fluids as her renal function has improved overnight. -Unfortunately there have been some issues with her IV access and we are awaiting the vascular team for placement of a PICC versus central line. -As of 5/3, her creatinine has improved to 2.8.  Crohn's disease -Stable  Hepatitis C cirrhosis -Appears stable, continue outpatient follow-up with GI.  Hypotension -Has resolved with IV fluids, suspect related to dehydration.  Hyponatremia -Appears to be chronic, never higher than 126. -Appears to be at baseline.  Hypokalemia -Replete orally, check magnesium level.  Thrombocytopenia -Likely due to liver disease, discontinue heparin products.  Anemia of chronic disease -Due to GI issues, hemoglobin stable without indication for transfusion at present.   DVT prophylaxis: SCDs Code Status: Full code Family Communication: Patient only Disposition Plan: Keep on for, hopefully discharge home within 48 hours  Consultants:   None  Procedures:   None  Antimicrobials:   None    Subjective: Concerned about lack of IV access, no true complaints  Objective: Filed Vitals:   06/09/15  2135 06/10/15 0550 06/10/15 0910 06/10/15 1300  BP: 97/58 98/55 99/47  116/47  Pulse: 70 65 73 65  Temp: 98.2 F (36.8 C) 98.1 F (36.7 C)  97.5 F (36.4 C)  TempSrc: Oral Oral  Oral  Resp: 16 16  16   Height:      Weight:      SpO2: 97% 98%  100%    Intake/Output Summary (Last 24 hours) at 06/10/15 1455 Last data filed at 06/10/15 1100  Gross per 24 hour  Intake 1038.33 ml  Output   1350 ml  Net -311.67 ml   Filed Weights   06/08/15 1112 06/08/15 1400 06/09/15 0500  Weight: 89.359 kg (197 lb) 88.1 kg (194 lb 3.6 oz) 92.5 kg (203 lb 14.8 oz)    Examination:  General exam: Alert, awake, oriented x 3 Respiratory system: Clear to auscultation. Respiratory effort normal. Cardiovascular system:RRR. No murmurs, rubs, gallops. Gastrointestinal system: Abdomen is nondistended, soft and nontender. No organomegaly or masses felt. Normal bowel sounds heard. Central nervous system: Alert and oriented. No focal neurological deficits. Extremities: No C/C/E, +pedal pulses Skin: No rashes, lesions or ulcers Psychiatry: Judgement and insight appear normal. Mood & affect appropriate.     Data Reviewed: I have personally reviewed following labs and imaging studies  CBC:  Recent Labs Lab 06/08/15 1130 06/09/15 0517  WBC 9.5 7.7  NEUTROABS 6.6  --   HGB 10.8* 9.0*  HCT 30.4* 25.7*  MCV 86.1 87.7  PLT 129* 91*   Basic Metabolic Panel:  Recent Labs Lab 06/08/15 1130 06/09/15 0517 06/10/15 0618  NA 127* 128* 127*  K 3.8 3.6 3.2*  CL 105 111 109  CO2 15* 11* 13*  GLUCOSE 115* 95 98  BUN 68* 60* 62*  CREATININE 3.50* 2.97* 2.81*  CALCIUM 8.9 7.8* 8.1*   GFR: Estimated Creatinine Clearance: 24.2 mL/min (by C-G formula based on Cr of 2.81). Liver Function Tests:  Recent Labs Lab 06/08/15 1130 06/09/15 0517  AST 29 26  ALT 22 17  ALKPHOS 120 99  BILITOT 1.2 0.7  PROT 5.7* 4.4*  ALBUMIN 2.9* 2.2*   No results for input(s): LIPASE, AMYLASE in the last 168  hours.  Recent Labs Lab 06/08/15 1130  AMMONIA 15   Coagulation Profile: No results for input(s): INR, PROTIME in the last 168 hours. Cardiac Enzymes: No results for input(s): CKTOTAL, CKMB, CKMBINDEX, TROPONINI in the last 168 hours. BNP (last 3 results) No results for input(s): PROBNP in the last 8760 hours. HbA1C: No results for input(s): HGBA1C in the last 72 hours. CBG: No results for input(s): GLUCAP in the last 168 hours. Lipid Profile: No results for input(s): CHOL, HDL, LDLCALC, TRIG, CHOLHDL, LDLDIRECT in the last 72 hours. Thyroid Function Tests: No results for input(s): TSH, T4TOTAL, FREET4, T3FREE, THYROIDAB in the last 72 hours. Anemia Panel: No results for input(s): VITAMINB12, FOLATE, FERRITIN, TIBC, IRON, RETICCTPCT in the last 72 hours. Urine analysis:    Component Value Date/Time   COLORURINE YELLOW 06/08/2015 1146   APPEARANCEUR CLEAR 06/08/2015 1146   APPEARANCEUR Hazy* 06/01/2015 1450   LABSPEC 1.015 06/08/2015 1146   PHURINE 6.0 06/08/2015 1146   GLUCOSEU NEGATIVE 06/08/2015 1146   HGBUR NEGATIVE 06/08/2015 1146   BILIRUBINUR NEGATIVE 06/08/2015 1146   BILIRUBINUR Negative 06/01/2015 1450   BILIRUBINUR negative 02/23/2015 1751   KETONESUR NEGATIVE 06/08/2015 1146   PROTEINUR NEGATIVE 06/08/2015 1146   PROTEINUR Trace 06/01/2015 1450   PROTEINUR 2+ 02/23/2015 1751   UROBILINOGEN negative 02/23/2015 1751   UROBILINOGEN 0.2 06/09/2014 0545   NITRITE NEGATIVE 06/08/2015 1146   NITRITE Negative 06/01/2015 1450   NITRITE negative 02/23/2015 1751   LEUKOCYTESUR TRACE* 06/08/2015 1146   LEUKOCYTESUR 3+* 06/01/2015 1450   Sepsis Labs: (procalcitonin:4,lacticidven:4)  ) Recent Results (from the past 240 hour(s))  Microscopic Examination     Status: Abnormal   Collection Time: 06/01/15  2:50 PM  Result Value Ref Range Status   WBC, UA >30 (A) 0 -  5 /hpf Final   RBC, UA 3-10 (A) 0 -  2 /hpf Final   Epithelial Cells (non renal) 0-10 0 -  10 /hpf Final   Bacteria, UA Few None seen/Few Final  Urine culture     Status: Abnormal   Collection Time: 06/08/15 11:46 AM  Result Value Ref Range Status   Specimen Description URINE, CATHETERIZED  Final   Special Requests NONE  Final   Culture (A)  Final    3,000 COLONIES/mL INSIGNIFICANT GROWTH [facility]    Report Status 06/10/2015 FINAL  Final  Blood Culture (routine x 2)     Status: None (Preliminary result)   Collection Time: 06/08/15 12:00 PM  Result Value Ref Range Status   Specimen Description BLOOD LEFT ANTECUBITAL DRAWN BY RN TV  Final   Special Requests BOTTLES DRAWN AEROBIC AND ANAEROBIC 6CC  Final   Culture NO GROWTH 2 DAYS  Final   Report Status PENDING  Incomplete  Blood Culture (routine x 2)     Status: None (Preliminary result)   Collection Time: 06/08/15 12:05 PM  Result Value Ref Range Status   Specimen Description BLOOD  RIGHT ANTECUBITAL  Final   Special Requests BOTTLES DRAWN AEROBIC AND ANAEROBIC 6CC  Final   Culture NO GROWTH 2 DAYS  Final   Report Status PENDING  Incomplete         Radiology Studies: No results found.      Scheduled Meds: . feeding supplement (ENSURE ENLIVE)  237 mL Oral BID BM  . gabapentin  100 mg Oral TID  . lactulose  20 g Oral BID  . mesalamine  1,000 mg Rectal QHS  . nadolol  10 mg Oral Daily  . pantoprazole  40 mg Oral Daily  . sodium chloride flush  3 mL Intravenous Q12H   Continuous Infusions: . sodium chloride 100 mL/hr at 06/09/15 1300     LOS: 2 days    Time spent: 30 minutes. Greater than 50% of this time was spent in direct contact with the patient coordinating care.     Chaya Jan, MD [facility] Pager [phone]  If 7PM-7AM, please contact night-coverage [email or web address] Password [email or web address] 06/10/2015, 2:55 PM

## 2015-06-10 NOTE — Telephone Encounter (Signed)
I have talked with Arline Asp her nurse and she will talk with patient

## 2015-06-10 NOTE — Telephone Encounter (Signed)
Patient called and left a message, stated that she is in room 302 at [facility] for kidney failure.  She was upset and stated that she is not getting any treatment, they aren't taking her vitals and they aren't allowing her any fluids.  She stated that she was scared and worried and would like a call back.

## 2015-06-11 LAB — CBC
HCT: 24.7 % — ABNORMAL LOW (ref 36.0–46.0)
HEMOGLOBIN: 8.6 g/dL — AB (ref 12.0–15.0)
MCH: 30.3 pg (ref 26.0–34.0)
MCHC: 34.8 g/dL (ref 30.0–36.0)
MCV: 87 fL (ref 78.0–100.0)
Platelets: 86 10*3/uL — ABNORMAL LOW (ref 150–400)
RBC: 2.84 MIL/uL — ABNORMAL LOW (ref 3.87–5.11)
RDW: 16.7 % — AB (ref 11.5–15.5)
WBC: 6.7 10*3/uL (ref 4.0–10.5)

## 2015-06-11 LAB — BASIC METABOLIC PANEL
ANION GAP: 5 (ref 5–15)
BUN: 58 mg/dL — ABNORMAL HIGH (ref 6–20)
CALCIUM: 7.9 mg/dL — AB (ref 8.9–10.3)
CO2: 14 mmol/L — ABNORMAL LOW (ref 22–32)
Chloride: 108 mmol/L (ref 101–111)
Creatinine, Ser: 2.36 mg/dL — ABNORMAL HIGH (ref 0.44–1.00)
GFR, EST AFRICAN AMERICAN: 25 mL/min — AB (ref >60)
GFR, EST NON AFRICAN AMERICAN: 21 mL/min — AB (ref >60)
GLUCOSE: 88 mg/dL (ref 65–99)
Potassium: 3.3 mmol/L — ABNORMAL LOW (ref 3.5–5.1)
Sodium: 127 mmol/L — ABNORMAL LOW (ref 135–145)

## 2015-06-11 MED ORDER — POTASSIUM CHLORIDE CRYS ER 20 MEQ PO TBCR
40.0000 meq | EXTENDED_RELEASE_TABLET | Freq: Once | ORAL | Status: AC
  Administered 2015-06-11: 40 meq via ORAL
  Filled 2015-06-11: qty 2

## 2015-06-11 NOTE — Progress Notes (Signed)
PROGRESS NOTE    Kathryn [name]  WUJ:[id number] DOB: 1955/06/17 DOA: 06/08/2015 PCP: [name], FNP     Brief Narrative:  60 year old woman admitted to the hospital 5/1 with increasing weakness and dizziness upon standing. She was found to be in acute renal failure with a creatinine of 3.5 and admission was requested.   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 -Plan to continue IV fluids as her renal function has improved overnight. -As of 5/3, her creatinine has improved to 2.8; on 5/4 down to 2.36  Crohn's disease -Stable  Hepatitis C cirrhosis -Appears stable, continue outpatient follow-up with GI.  Hypotension -Has resolved with IV fluids, suspect related to dehydration.  Hyponatremia -Appears to be chronic, never higher than 128. -Appears to be at baseline.  Hypokalemia -Replete orally. -Mag ok at 1.9.  Thrombocytopenia -Likely due to liver disease, discontinue heparin products.  Anemia of chronic disease -Due to GI issues, hemoglobin stable without indication for transfusion at present.   DVT prophylaxis: SCDs Code Status: Full code Family Communication: Patient only Disposition Plan: Keep on for, hopefully discharge home within 24 hours  Consultants:   None  Procedures:   None  Antimicrobials:   None    Subjective: no true complaints  Objective: Filed Vitals:   06/10/15 2218 06/11/15 0440 06/11/15 0957 06/11/15 1300  BP: 108/50 119/63 91/41 105/40  Pulse: 89 76 75 76  Temp: 98.7 F (37.1 C) 98.2 F (36.8 C)  98.3 F (36.8 C)    TempSrc: Oral Oral  Oral  Resp: 20 20 20 18   Height:      Weight:      SpO2: 98% 100%  99%    Intake/Output Summary (Last 24 hours) at 06/11/15 1450 Last data filed at 06/11/15 1202  Gross per 24 hour  Intake   1967 ml  Output    800 ml  Net   1167 ml   Filed Weights   06/08/15 1112 06/08/15 1400 06/09/15 0500  Weight: 89.359 kg (197 lb) 88.1 kg (194 lb 3.6 oz) 92.5 kg (203 lb 14.8 oz)    Examination:  General exam: Alert, awake, oriented x 3 Respiratory system: Clear to auscultation. Respiratory effort normal. Cardiovascular system:RRR. No murmurs, rubs, gallops. Gastrointestinal system: Abdomen is nondistended, soft and nontender. No organomegaly or masses felt. Normal bowel sounds heard. Central nervous system: Alert and oriented. No focal neurological deficits. Extremities: No C/C/E, +pedal pulses Skin: No rashes, lesions or ulcers Psychiatry: Judgement and insight appear normal. Mood & affect appropriate.     Data Reviewed: I have personally reviewed following labs and imaging studies  CBC:  Recent Labs Lab 06/08/15 1130 06/09/15 0517 06/11/15 0616  WBC 9.5 7.7 6.7  NEUTROABS 6.6  --   --   HGB 10.8* 9.0* 8.6*  HCT 30.4* 25.7* 24.7*  MCV 86.1 87.7 87.0  PLT 129* 91* 86*   Basic Metabolic Panel:  Recent Labs Lab 06/08/15 1130 06/09/15 0517 06/10/15 0618 06/10/15 1800 06/11/15 0616  NA 127* 128* 127*  --  127*  K 3.8 3.6 3.2*  --  3.3*  CL 105 111 109  --  108  CO2 15* 11* 13*  --  14*  GLUCOSE 115* 95 98  --  88  BUN 68* 60* 62*  --  58*  CREATININE 3.50* 2.97* 2.81*  --  2.36*  CALCIUM 8.9 7.8* 8.1*  --  7.9*  MG  --   --   --  1.9  --    GFR: Estimated Creatinine Clearance: 28.8 mL/min (by C-G formula based on Cr of 2.36). Liver Function Tests:  Recent Labs Lab 06/08/15 1130 06/09/15 0517  AST 29 26  ALT 22 17  ALKPHOS 120 99  BILITOT 1.2 0.7  PROT 5.7* 4.4*  ALBUMIN 2.9* 2.2*   No results for input(s): LIPASE, AMYLASE in the  last 168 hours.  Recent Labs Lab 06/08/15 1130  AMMONIA 15   Coagulation Profile: No results for input(s): INR, PROTIME in the last 168 hours. Cardiac Enzymes: No results for input(s): CKTOTAL, CKMB, CKMBINDEX, TROPONINI in the last 168 hours. BNP (last 3 results) No results for input(s): PROBNP in the last 8760 hours. HbA1C: No results for input(s): HGBA1C in the last 72 hours. CBG: No results for input(s): GLUCAP in the last 168 hours. Lipid Profile: No results for input(s): CHOL, HDL, LDLCALC, TRIG, CHOLHDL, LDLDIRECT in the last 72 hours. Thyroid Function Tests: No results for input(s): TSH, T4TOTAL, FREET4, T3FREE, THYROIDAB in the last 72 hours. Anemia Panel: No results for input(s): VITAMINB12, FOLATE, FERRITIN, TIBC, IRON, RETICCTPCT in the last 72 hours. Urine analysis:    Component Value Date/Time   COLORURINE YELLOW 06/08/2015 1146   APPEARANCEUR CLEAR 06/08/2015 1146   APPEARANCEUR Hazy* 06/01/2015 1450   LABSPEC 1.015 06/08/2015 1146   PHURINE 6.0 06/08/2015 1146   GLUCOSEU NEGATIVE 06/08/2015 1146   HGBUR NEGATIVE 06/08/2015 1146   BILIRUBINUR NEGATIVE 06/08/2015 1146   BILIRUBINUR Negative 06/01/2015 1450   BILIRUBINUR negative 02/23/2015 1751   KETONESUR NEGATIVE 06/08/2015 1146   PROTEINUR NEGATIVE 06/08/2015 1146   PROTEINUR Trace 06/01/2015 1450   PROTEINUR 2+ 02/23/2015 1751   UROBILINOGEN negative 02/23/2015 1751   UROBILINOGEN 0.2 06/09/2014 0545   NITRITE NEGATIVE 06/08/2015 1146   NITRITE Negative 06/01/2015 1450   NITRITE negative 02/23/2015 1751   LEUKOCYTESUR TRACE* 06/08/2015 1146   LEUKOCYTESUR 3+* 06/01/2015 1450   Sepsis Labs: @LABRCNTIP (procalcitonin:4,lacticidven:4)  ) Recent Results (from the past 240 hour(s))  Urine culture     Status: Abnormal   Collection Time: 06/08/15 11:46 AM  Result Value Ref Range Status   Specimen Description URINE, CATHETERIZED  Final   Special Requests NONE  Final   Culture (A)  Final    3,000  COLONIES/mL INSIGNIFICANT GROWTH Performed at [facility]    Report Status 06/10/2015 FINAL  Final  Blood Culture (routine x 2)     Status: None (Preliminary result)   Collection Time: 06/08/15 12:00 PM  Result Value Ref Range Status   Specimen Description BLOOD LEFT ANTECUBITAL DRAWN BY RN TV  Final   Special Requests BOTTLES DRAWN AEROBIC AND ANAEROBIC 6CC  Final   Culture NO GROWTH 3 DAYS  Final   Report Status PENDING  Incomplete  Blood Culture (routine x 2)     Status: None (Preliminary result)   Collection Time: 06/08/15 12:05 PM  Result Value Ref Range Status   Specimen Description BLOOD RIGHT ANTECUBITAL  Final   Special Requests BOTTLES DRAWN AEROBIC AND ANAEROBIC 6CC  Final   Culture NO GROWTH 3 DAYS  Final  Report Status PENDING  Incomplete         Radiology Studies: Dg Chest Port 1 View  06/10/2015  CLINICAL DATA:  Status post PICC Line placement. Weakness, dizziness. History of HTN, Renal insufficiency, crohn's, cirrhosis. EXAM: PORTABLE CHEST 1 VIEW COMPARISON:  06/08/2015 FINDINGS: Right-sided central line, likely from an internal jugular approach. This terminates at the low SVC. Midline trachea. Normal heart size. Atherosclerosis in the transverse aorta. No pleural effusion or pneumothorax. Clear lungs. IMPRESSION: Right internal jugular line with tip at low SVC.  No pneumothorax. Aortic atherosclerosis. Electronically Signed   By: Jeronimo Greaves M.D.   On: 06/10/2015 16:39        Scheduled Meds: . feeding supplement (ENSURE ENLIVE)  237 mL Oral BID BM  . gabapentin  100 mg Oral TID  . lactulose  20 g Oral BID  . mesalamine  1,000 mg Rectal QHS  . nadolol  10 mg Oral Daily  . pantoprazole  40 mg Oral Daily  . sodium chloride flush  3 mL Intravenous Q12H   Continuous Infusions: . sodium chloride 100 mL/hr at 06/11/15 1202     LOS: 3 days    Time spent: 25 minutes. Greater than 50% of this time was spent in direct contact with the patient  coordinating care.     Chaya Jan, MD [facility] Pager [phone]  If 7PM-7AM, please contact night-coverage [email or web address] Password [email or web address] 06/11/2015, 2:50 PM

## 2015-06-11 NOTE — Care Management Important Message (Signed)
Important Message  Patient Details  Name: JAIDALYNN ACCURSO MRN: [id number] Date of Birth: 1955-04-04   Medicare Important Message Given:  Yes    Malcolm Metro, RN 06/11/2015, 11:03 AM

## 2015-06-12 LAB — BASIC METABOLIC PANEL
Anion gap: 6 (ref 5–15)
BUN: 52 mg/dL — ABNORMAL HIGH (ref 6–20)
CALCIUM: 7.7 mg/dL — AB (ref 8.9–10.3)
CHLORIDE: 108 mmol/L (ref 101–111)
CO2: 12 mmol/L — AB (ref 22–32)
Creatinine, Ser: 2.38 mg/dL — ABNORMAL HIGH (ref 0.44–1.00)
GFR calc non Af Amer: 21 mL/min — ABNORMAL LOW (ref >60)
GFR, EST AFRICAN AMERICAN: 25 mL/min — AB (ref >60)
GLUCOSE: 121 mg/dL — AB (ref 65–99)
POTASSIUM: 3.1 mmol/L — AB (ref 3.5–5.1)
Sodium: 126 mmol/L — ABNORMAL LOW (ref 135–145)

## 2015-06-12 NOTE — Progress Notes (Signed)
PROGRESS NOTE    Kathryn [name]  ZOX:[id number] DOB: 1955-09-25 DOA: 06/08/2015 PCP: [name], FNP     Brief Narrative:  60 year old woman admitted to the hospital 5/1 with increasing weakness and dizziness upon standing. She was found to be in acute renal failure with a creatinine of 3.5 and admission was requested.   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 -Plan to continue IV fluids as her renal function has improved overnight. -As of 5/3, her creatinine has improved to 2.8; on 5/4 down to 2.36, 2.38 as of 5/5. -Based and creatinine appears to be between 1.8 and 2.3.  Crohn's disease -Stable  Hepatitis C cirrhosis -Appears stable, continue outpatient follow-up with GI.  Hypotension -Has resolved with IV fluids, suspect related to dehydration.  Hyponatremia -Appears to be chronic, never higher than 128. -Appears to be at baseline.  Hypokalemia -Replete orally. -Mag ok at 1.9.  Thrombocytopenia -Likely due to liver disease, discontinue heparin products.  Anemia of chronic disease -Due to GI issues, hemoglobin stable without indication for transfusion at present.   DVT prophylaxis: SCDs Code Status: Full code Family Communication: Patient only Disposition Plan: Keep on for, hopefully discharge home within 24 hours  Consultants:   None  Procedures:   None  Antimicrobials:   None    Subjective: no true complaints  Objective: Filed Vitals:   06/11/15 1430 06/11/15 2150 06/12/15 0534 06/12/15 1300  BP:  111/52 88/53 101/55  Pulse:  83  78    Temp:  98.1 F (36.7 C) 98.3 F (36.8 C) 98.2 F (36.8 C)  TempSrc:  Oral Oral Oral  Resp:  20 18 18   Height:      Weight:      SpO2: 96% 100% 98% 99%    Intake/Output Summary (Last 24 hours) at 06/12/15 1624 Last data filed at 06/12/15 0824  Gross per 24 hour  Intake    360 ml  Output      0 ml  Net    360 ml   Filed Weights   06/08/15 1112 06/08/15 1400 06/09/15 0500  Weight: 89.359 kg (197 lb) 88.1 kg (194 lb 3.6 oz) 92.5 kg (203 lb 14.8 oz)    Examination:  General exam: Alert, awake, oriented x 3 Respiratory system: Clear to auscultation. Respiratory effort normal. Cardiovascular system:RRR. No murmurs, rubs, gallops. Gastrointestinal system: Abdomen is nondistended, soft and nontender. No organomegaly or masses felt. Normal bowel sounds heard. Central nervous system: Alert and oriented. No focal neurological deficits. Extremities: No C/C/E, +pedal pulses Skin: No rashes, lesions or ulcers Psychiatry: Judgement and insight appear normal. Mood & affect appropriate.     Data Reviewed: I have personally reviewed following labs and imaging studies  CBC:  Recent Labs Lab 06/08/15 1130 06/09/15 0517 06/11/15 0616  WBC 9.5 7.7 6.7  NEUTROABS 6.6  --   --   HGB 10.8* 9.0* 8.6*  HCT 30.4* 25.7* 24.7*  MCV 86.1 87.7 87.0  PLT 129* 91* 86*   Basic Metabolic Panel:  Recent Labs Lab 06/08/15 1130 06/09/15 0517 06/10/15 0618 06/10/15  1800 06/11/15 0616 06/12/15 0806  NA 127* 128* 127*  --  127* 126*  K 3.8 3.6 3.2*  --  3.3* 3.1*  CL 105 111 109  --  108 108  CO2 15* 11* 13*  --  14* 12*  GLUCOSE 115* 95 98  --  88 121*  BUN 68* 60* 62*  --  58* 52*  CREATININE 3.50* 2.97* 2.81*  --  2.36* 2.38*  CALCIUM 8.9 7.8* 8.1*  --  7.9* 7.7*  MG  --   --   --  1.9  --   --    GFR: Estimated Creatinine Clearance: 28.6 mL/min (by C-G formula based on Cr of 2.38). Liver Function Tests:  Recent Labs Lab 06/08/15 1130 06/09/15 0517  AST 29 26  ALT 22 17   ALKPHOS 120 99  BILITOT 1.2 0.7  PROT 5.7* 4.4*  ALBUMIN 2.9* 2.2*   No results for input(s): LIPASE, AMYLASE in the last 168 hours.  Recent Labs Lab 06/08/15 1130  AMMONIA 15   Coagulation Profile: No results for input(s): INR, PROTIME in the last 168 hours. Cardiac Enzymes: No results for input(s): CKTOTAL, CKMB, CKMBINDEX, TROPONINI in the last 168 hours. BNP (last 3 results) No results for input(s): PROBNP in the last 8760 hours. HbA1C: No results for input(s): HGBA1C in the last 72 hours. CBG: No results for input(s): GLUCAP in the last 168 hours. Lipid Profile: No results for input(s): CHOL, HDL, LDLCALC, TRIG, CHOLHDL, LDLDIRECT in the last 72 hours. Thyroid Function Tests: No results for input(s): TSH, T4TOTAL, FREET4, T3FREE, THYROIDAB in the last 72 hours. Anemia Panel: No results for input(s): VITAMINB12, FOLATE, FERRITIN, TIBC, IRON, RETICCTPCT in the last 72 hours. Urine analysis:    Component Value Date/Time   COLORURINE YELLOW 06/08/2015 1146   APPEARANCEUR CLEAR 06/08/2015 1146   APPEARANCEUR Hazy* 06/01/2015 1450   LABSPEC 1.015 06/08/2015 1146   PHURINE 6.0 06/08/2015 1146   GLUCOSEU NEGATIVE 06/08/2015 1146   HGBUR NEGATIVE 06/08/2015 1146   BILIRUBINUR NEGATIVE 06/08/2015 1146   BILIRUBINUR Negative 06/01/2015 1450   BILIRUBINUR negative 02/23/2015 1751   KETONESUR NEGATIVE 06/08/2015 1146   PROTEINUR NEGATIVE 06/08/2015 1146   PROTEINUR Trace 06/01/2015 1450   PROTEINUR 2+ 02/23/2015 1751   UROBILINOGEN negative 02/23/2015 1751   UROBILINOGEN 0.2 06/09/2014 0545   NITRITE NEGATIVE 06/08/2015 1146   NITRITE Negative 06/01/2015 1450   NITRITE negative 02/23/2015 1751   LEUKOCYTESUR TRACE* 06/08/2015 1146   LEUKOCYTESUR 3+* 06/01/2015 1450   Sepsis Labs: (procalcitonin:4,lacticidven:4)  ) Recent Results (from the past 240 hour(s))  Urine culture     Status: Abnormal   Collection Time: 06/08/15 11:46 AM  Result Value Ref Range  Status   Specimen Description URINE, CATHETERIZED  Final   Special Requests NONE  Final   Culture (A)  Final    3,000 COLONIES/mL INSIGNIFICANT GROWTH Performed at [facility]    Report Status 06/10/2015 FINAL  Final  Blood Culture (routine x 2)     Status: None (Preliminary result)   Collection Time: 06/08/15 12:00 PM  Result Value Ref Range Status   Specimen Description BLOOD LEFT ANTECUBITAL DRAWN BY RN TV  Final   Special Requests BOTTLES DRAWN AEROBIC AND ANAEROBIC 6CC  Final   Culture NO GROWTH 3 DAYS  Final   Report Status PENDING  Incomplete  Blood Culture (routine x 2)     Status: None (Preliminary result)   Collection Time: 06/08/15 12:05 PM  Result Value Ref  Range Status   Specimen Description BLOOD RIGHT ANTECUBITAL  Final   Special Requests BOTTLES DRAWN AEROBIC AND ANAEROBIC 6CC  Final   Culture NO GROWTH 3 DAYS  Final   Report Status PENDING  Incomplete         Radiology Studies: Dg Chest Port 1 View  06/10/2015  CLINICAL DATA:  Status post PICC Line placement. Weakness, dizziness. History of HTN, Renal insufficiency, crohn's, cirrhosis. EXAM: PORTABLE CHEST 1 VIEW COMPARISON:  06/08/2015 FINDINGS: Right-sided central line, likely from an internal jugular approach. This terminates at the low SVC. Midline trachea. Normal heart size. Atherosclerosis in the transverse aorta. No pleural effusion or pneumothorax. Clear lungs. IMPRESSION: Right internal jugular line with tip at low SVC.  No pneumothorax. Aortic atherosclerosis. Electronically Signed   By: Jeronimo Greaves M.D.   On: 06/10/2015 16:39        Scheduled Meds: . feeding supplement (ENSURE ENLIVE)  237 mL Oral BID BM  . gabapentin  100 mg Oral TID  . lactulose  20 g Oral BID  . mesalamine  1,000 mg Rectal QHS  . nadolol  10 mg Oral Daily  . pantoprazole  40 mg Oral Daily  . sodium chloride flush  3 mL Intravenous Q12H   Continuous Infusions: . sodium chloride 100 mL/hr at 06/12/15 0824     LOS:  4 days    Time spent: 25 minutes. Greater than 50% of this time was spent in direct contact with the patient coordinating care.     Chaya Jan, MD [facility] Pager [phone]  If 7PM-7AM, please contact night-coverage [email or web address] Password [facility] 06/12/2015, 4:24 PM

## 2015-06-12 NOTE — Care Management Important Message (Signed)
Important Message  Patient Details  Name: CARMELLIA MERLIN MRN: [id number] Date of Birth: 06-05-1955   Medicare Important Message Given:  Yes    Malcolm Metro, RN 06/12/2015, 3:37 PM

## 2015-06-13 LAB — CULTURE, BLOOD (ROUTINE X 2)
CULTURE: NO GROWTH
CULTURE: NO GROWTH

## 2015-06-13 LAB — BASIC METABOLIC PANEL
Anion gap: 6 (ref 5–15)
BUN: 50 mg/dL — AB (ref 6–20)
CALCIUM: 7.9 mg/dL — AB (ref 8.9–10.3)
CO2: 12 mmol/L — AB (ref 22–32)
Chloride: 109 mmol/L (ref 101–111)
Creatinine, Ser: 2.07 mg/dL — ABNORMAL HIGH (ref 0.44–1.00)
GFR calc Af Amer: 29 mL/min — ABNORMAL LOW (ref >60)
GFR, EST NON AFRICAN AMERICAN: 25 mL/min — AB (ref >60)
GLUCOSE: 135 mg/dL — AB (ref 65–99)
POTASSIUM: 2.7 mmol/L — AB (ref 3.5–5.1)
Sodium: 127 mmol/L — ABNORMAL LOW (ref 135–145)

## 2015-06-13 MED ORDER — POTASSIUM CHLORIDE CRYS ER 20 MEQ PO TBCR
40.0000 meq | EXTENDED_RELEASE_TABLET | ORAL | Status: AC
  Administered 2015-06-13 (×3): 40 meq via ORAL
  Filled 2015-06-13 (×3): qty 2

## 2015-06-13 NOTE — Progress Notes (Signed)
PROGRESS NOTE    Kathryn [name]  ZOX:[id number] DOB: 05/07/1955 DOA: 06/08/2015 PCP: [name], FNP     Brief Narrative:  60 year old woman admitted to the hospital 5/1 with increasing weakness and dizziness upon standing. She was found to be in acute renal failure with a creatinine of 3.5 and admission was requested.   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 -Plan to continue IV fluids as her renal function has improved overnight. -As of 5/3, her creatinine has improved to 2.8; on 5/4 down to 2.36, 2.38 as of 5/5, 2.07 on 5/6. -Based and creatinine appears to be between 1.8 and 2.3.  Crohn's disease -Stable  Hepatitis C cirrhosis -Appears stable, continue outpatient follow-up with GI.  Hypotension -Has resolved with IV fluids, suspect related to dehydration.  Hyponatremia -Appears to be chronic, never higher than 128. -Appears to be at baseline.  Hypokalemia -Replete orally. -Mag ok at 1.9.  Thrombocytopenia -Likely due to liver disease, discontinue heparin products.  Anemia of chronic disease -Due to GI issues, hemoglobin stable without indication for transfusion at present.   DVT prophylaxis: SCDs Code Status: Full code Family Communication: Patient only Disposition Plan: Keep on for, hopefully discharge home within 24 hours  Consultants:   None  Procedures:   None  Antimicrobials:   None    Subjective: no true complaints  Objective: Filed Vitals:   06/12/15 1300 06/12/15 2045 06/13/15 0543 06/13/15 1300  BP: 101/55 99/43 98/44  119/50    Pulse: 78 65 82 76  Temp: 98.2 F (36.8 C) 98.1 F (36.7 C) 98 F (36.7 C) 97.8 F (36.6 C)  TempSrc: Oral Oral Oral Oral  Resp: 18 20 20 20   Height:      Weight:      SpO2: 99% 96% 98% 100%    Intake/Output Summary (Last 24 hours) at 06/13/15 1613 Last data filed at 06/13/15 1435  Gross per 24 hour  Intake 3703.33 ml  Output    900 ml  Net 2803.33 ml   Filed Weights   06/08/15 1112 06/08/15 1400 06/09/15 0500  Weight: 89.359 kg (197 lb) 88.1 kg (194 lb 3.6 oz) 92.5 kg (203 lb 14.8 oz)    Examination:  General exam: Alert, awake, oriented x 3 Respiratory system: Clear to auscultation. Respiratory effort normal. Cardiovascular system:RRR. No murmurs, rubs, gallops. Gastrointestinal system: Abdomen is nondistended, soft and nontender. No organomegaly or masses felt. Normal bowel sounds heard. Central nervous system: Alert and oriented. No focal neurological deficits. Extremities: No C/C/E, +pedal pulses Skin: No rashes, lesions or ulcers Psychiatry: Judgement and insight appear normal. Mood & affect appropriate.     Data Reviewed: I have personally reviewed following labs and imaging studies  CBC:  Recent Labs Lab 06/08/15 1130 06/09/15 0517 06/11/15 0616  WBC 9.5 7.7 6.7  NEUTROABS 6.6  --   --   HGB 10.8* 9.0* 8.6*  HCT 30.4* 25.7* 24.7*  MCV 86.1 87.7 87.0  PLT 129* 91* 86*   Basic Metabolic Panel:  Recent Labs Lab 06/09/15 0517 06/10/15 0618 06/10/15 1800 06/11/15 0616 06/12/15  1610 06/13/15 0543  NA 128* 127*  --  127* 126* 127*  K 3.6 3.2*  --  3.3* 3.1* 2.7*  CL 111 109  --  108 108 109  CO2 11* 13*  --  14* 12* 12*  GLUCOSE 95 98  --  88 121* 135*  BUN 60* 62*  --  58* 52* 50*  CREATININE 2.97* 2.81*  --  2.36* 2.38* 2.07*  CALCIUM 7.8* 8.1*  --  7.9* 7.7* 7.9*  MG  --   --  1.9  --   --   --    GFR: Estimated Creatinine Clearance: 32.9 mL/min (by C-G formula based on Cr of 2.07). Liver Function Tests:  Recent Labs Lab  06/08/15 1130 06/09/15 0517  AST 29 26  ALT 22 17  ALKPHOS 120 99  BILITOT 1.2 0.7  PROT 5.7* 4.4*  ALBUMIN 2.9* 2.2*   No results for input(s): LIPASE, AMYLASE in the last 168 hours.  Recent Labs Lab 06/08/15 1130  AMMONIA 15   Coagulation Profile: No results for input(s): INR, PROTIME in the last 168 hours. Cardiac Enzymes: No results for input(s): CKTOTAL, CKMB, CKMBINDEX, TROPONINI in the last 168 hours. BNP (last 3 results) No results for input(s): PROBNP in the last 8760 hours. HbA1C: No results for input(s): HGBA1C in the last 72 hours. CBG: No results for input(s): GLUCAP in the last 168 hours. Lipid Profile: No results for input(s): CHOL, HDL, LDLCALC, TRIG, CHOLHDL, LDLDIRECT in the last 72 hours. Thyroid Function Tests: No results for input(s): TSH, T4TOTAL, FREET4, T3FREE, THYROIDAB in the last 72 hours. Anemia Panel: No results for input(s): VITAMINB12, FOLATE, FERRITIN, TIBC, IRON, RETICCTPCT in the last 72 hours. Urine analysis:    Component Value Date/Time   COLORURINE YELLOW 06/08/2015 1146   APPEARANCEUR CLEAR 06/08/2015 1146   APPEARANCEUR Hazy* 06/01/2015 1450   LABSPEC 1.015 06/08/2015 1146   PHURINE 6.0 06/08/2015 1146   GLUCOSEU NEGATIVE 06/08/2015 1146   HGBUR NEGATIVE 06/08/2015 1146   BILIRUBINUR NEGATIVE 06/08/2015 1146   BILIRUBINUR Negative 06/01/2015 1450   BILIRUBINUR negative 02/23/2015 1751   KETONESUR NEGATIVE 06/08/2015 1146   PROTEINUR NEGATIVE 06/08/2015 1146   PROTEINUR Trace 06/01/2015 1450   PROTEINUR 2+ 02/23/2015 1751   UROBILINOGEN negative 02/23/2015 1751   UROBILINOGEN 0.2 06/09/2014 0545   NITRITE NEGATIVE 06/08/2015 1146   NITRITE Negative 06/01/2015 1450   NITRITE negative 02/23/2015 1751   LEUKOCYTESUR TRACE* 06/08/2015 1146   LEUKOCYTESUR 3+* 06/01/2015 1450   Sepsis Labs: @LABRCNTIP (procalcitonin:4,lacticidven:4)  ) Recent Results (from the past 240 hour(s))  Urine culture     Status: Abnormal    Collection Time: 06/08/15 11:46 AM  Result Value Ref Range Status   Specimen Description URINE, CATHETERIZED  Final   Special Requests NONE  Final   Culture (A)  Final    3,000 COLONIES/mL INSIGNIFICANT GROWTH Performed at [facility]    Report Status 06/10/2015 FINAL  Final  Blood Culture (routine x 2)     Status: None   Collection Time: 06/08/15 12:00 PM  Result Value Ref Range Status   Specimen Description BLOOD LEFT ANTECUBITAL DRAWN BY RN TV  Final   Special Requests BOTTLES DRAWN AEROBIC AND ANAEROBIC [facility]  Final   Culture NO GROWTH 5 DAYS  Final   Report Status 06/13/2015 FINAL  Final  Blood Culture (routine x 2)     Status: None   Collection Time: 06/08/15 12:05 PM  Result Value Ref Range Status   Specimen  Description BLOOD RIGHT ANTECUBITAL  Final   Special Requests BOTTLES DRAWN AEROBIC AND ANAEROBIC 6CC  Final   Culture NO GROWTH 5 DAYS  Final   Report Status 06/13/2015 FINAL  Final         Radiology Studies: No results found.      Scheduled Meds: . feeding supplement (ENSURE ENLIVE)  237 mL Oral BID BM  . gabapentin  100 mg Oral TID  . lactulose  20 g Oral BID  . mesalamine  1,000 mg Rectal QHS  . nadolol  10 mg Oral Daily  . pantoprazole  40 mg Oral Daily  . sodium chloride flush  3 mL Intravenous Q12H   Continuous Infusions: . sodium chloride 100 mL/hr at 06/13/15 1435     LOS: 5 days    Time spent: 25 minutes. Greater than 50% of this time was spent in direct contact with the patient coordinating care.     Chaya Jan, MD [facility] Pager [phone]  If 7PM-7AM, please contact night-coverage [email or web address] Password [email or web address] 06/13/2015, 4:13 PM

## 2015-06-14 LAB — BASIC METABOLIC PANEL
Anion gap: 4 — ABNORMAL LOW (ref 5–15)
BUN: 41 mg/dL — ABNORMAL HIGH (ref 6–20)
CHLORIDE: 114 mmol/L — AB (ref 101–111)
CO2: 11 mmol/L — AB (ref 22–32)
Calcium: 7.1 mg/dL — ABNORMAL LOW (ref 8.9–10.3)
Creatinine, Ser: 1.56 mg/dL — ABNORMAL HIGH (ref 0.44–1.00)
GFR calc non Af Amer: 35 mL/min — ABNORMAL LOW (ref >60)
GFR, EST AFRICAN AMERICAN: 41 mL/min — AB (ref >60)
Glucose, Bld: 98 mg/dL (ref 65–99)
POTASSIUM: 2.9 mmol/L — AB (ref 3.5–5.1)
SODIUM: 129 mmol/L — AB (ref 135–145)

## 2015-06-14 MED ORDER — POTASSIUM CHLORIDE ER 20 MEQ PO TBCR: 40.0000 meq | EXTENDED_RELEASE_TABLET | Freq: Two times a day (BID) | ORAL | Status: DC

## 2015-06-14 NOTE — Progress Notes (Signed)
Patient states understanding of discharge instructions, prescription given. 

## 2015-06-14 NOTE — Discharge Summary (Signed)
Physician Discharge Summary  Kathryn [name] ZOX:[id number] DOB: 03/12/1955 DOA: 06/08/2015  PCP: Kathryn Rodney, FNP  Admit date: 06/08/2015 Discharge date: 06/14/2015  Time spent: 45 minutes  Recommendations for Outpatient Follow-up:  -We'll be discharge home today. -Advised to follow-up with primary care provider in one week at which time a basic metabolic panel should be done to follow-up on potassium levels.   Discharge Diagnoses:  Principal Problem:   Acute on chronic renal failure (HCC) Active Problems:   Crohn's disease (HCC)   Hepatic cirrhosis (HCC)   Hepatitis C antibody test positive   Hypotension   ARF (acute renal failure) (HCC)   CKD (chronic kidney disease) stage 3, GFR 30-59 ml/min   Discharge Condition: Stable and improved  Filed Weights   06/08/15 1112 06/08/15 1400 06/09/15 0500  Weight: 89.359 kg (197 lb) 88.1 kg (194 lb 3.6 oz) 92.5 kg (203 lb 14.8 oz)    History of present illness:  As per Dr. Rhona Leavens on 5/1: Kathryn [name] is a 60 y.o. female with medical history significant of hep C cirrhosis, Crohn's disease, CKD, Htn who presents to the emergency department with complaints of increasing weakness and dizziness on standing. Patient was recently discharged on 05/24/2015 for treatment of acute renal failure. She improved with hydration and was discharged home. Patient followed up on 06/01/2015 with her PCP, Kathryn [name], where repeat blood work was obtained. Blood work did demonstrate a creatinine of 2.33 (was 1.87 on 05/24/2015).   ED Course: In the emergency department, patient noted to have a presenting creatinine of 3.50 with sodium of 127. Presenting blood pressure also noted to be 79/53. Ammonia was within normal limits at 15. Lactate was normal at 0.91. Patient was given 1 L normal saline bolus and hospitalist service consulted for consideration for admission.  Hospital Course:   Acute on chronic kidney disease stage 3-4 -Acute component was likely  due to prerenal azotemia. -Plan to continue IV fluids as her renal function has improved overnight. -As of 5/3, her creatinine has improved to 2.8; on 5/4 down to 2.36, 2.38 as of 5/5, 2.07 on 5/6. On DC her Cr is 1.56. -Based and creatinine appears to be between 1.8 and 2.3.  Crohn's disease -Stable, continue mesalamine.  Hepatitis C cirrhosis -Appears stable, continue outpatient follow-up with GI.  Hypotension -Has resolved with IV fluids, suspect related to dehydration.  Hyponatremia -Appears to be chronic, never higher than 128. -Appears to be at baseline.  Hypokalemia -Replete orally. -Has been advised to take 40 mEq twice a day for 3 days for potassium of 2.9 at time of discharge, then she is to continue her regular dosing of 20 mEq daily. -We'll need follow-up in one week for basic metabolic profile, patient is aware of this need. -Mag ok at 1.9.  Thrombocytopenia -Likely due to liver disease, discontinue heparin products.  Anemia of chronic disease -Due to GI issues, hemoglobin stable without indication for transfusion at present.  Procedures:  None   Consultations:  None  Discharge Instructions  Discharge Instructions    Diet - low sodium heart healthy    Complete by:  As directed      Increase activity slowly    Complete by:  As directed      PICC line removal    Complete by:  As directed             Medication List    STOP taking these medications  sulfamethoxazole-trimethoprim 800-160 MG tablet  Commonly known as:  BACTRIM DS      TAKE these medications        ALPRAZolam 1 MG tablet  Commonly known as:  XANAX  TAKE 1 TABLET BY MOUTH AT BEDTIME AS NEEDED     feeding supplement (ENSURE ENLIVE) Liqd  Take 237 mLs by mouth daily.     furosemide 20 MG tablet  Commonly known as:  LASIX  Take 1 tablet (20 mg total) by mouth daily.     gabapentin 100 MG capsule  Commonly known as:  NEURONTIN  Take 1 capsule (100 mg total) by mouth 3  (three) times daily.     lactulose 10 GM/15ML solution  Commonly known as:  CHRONULAC  Take 30 mLs (20 g total) by mouth 2 (two) times daily.     mesalamine 1000 MG suppository  Commonly known as:  CANASA  Place 1 suppository (1,000 mg total) rectally at bedtime.     multivitamin with minerals tablet  Take 1 tablet by mouth daily.     nadolol 20 MG tablet  Commonly known as:  CORGARD  Take 0.5 tablets (10 mg total) by mouth daily.     pantoprazole 40 MG tablet  Commonly known as:  PROTONIX  TAKE 1 TABLET BY MOUTH DAILY     potassium chloride 20 MEQ packet  Commonly known as:  KLOR-CON  Take 20 mEq by mouth daily.     Potassium Chloride ER 20 MEQ Tbcr  Take 40 mEq by mouth 2 (two) times daily. For 3 days. Afterwards, take your home dose of 20 meq daily.     PROBIOTIC PO  Take 1 tablet by mouth daily.     spironolactone 25 MG tablet  Commonly known as:  ALDACTONE  Take 25 mg by mouth once.     VITAMIN D-1000 MAX ST 1000 units tablet  Generic drug:  Cholecalciferol  Take 1,000 Units by mouth daily.       Allergies  Allergen Reactions  . Penicillins Rash    Has patient had a PCN reaction causing immediate rash, facial/tongue/throat swelling, SOB or lightheadedness with hypotension: Yes Has patient had a PCN reaction causing severe rash involving mucus membranes or skin necrosis: No Has patient had a PCN reaction that required hospitalization No Has patient had a PCN reaction occurring within the last 10 years: No If all of the above answers are "NO", then may proceed with Cephalosporin use.        Follow-up Information    Follow up with Kathryn Rodney, FNP. Schedule an appointment as soon as possible for a visit in 1 week.   Specialty:  Family Medicine   Contact information:   [address] [place] [address] [phone]        The results of significant diagnostics from this hospitalization (including imaging, microbiology, ancillary and  laboratory) are listed below for reference.    Significant Diagnostic Studies: Ct Abdomen Pelvis Wo Contrast  05/21/2015  CLINICAL DATA:  Left-sided flank pain and generalized weakness. Cirrhosis. Stage 3 kidney disease. EXAM: CT ABDOMEN AND PELVIS WITHOUT CONTRAST TECHNIQUE: Multidetector CT imaging of the abdomen and pelvis was performed following the standard protocol without IV contrast. COMPARISON:  03/10/2015 FINDINGS: Lower chest: The lung bases are clear. No pleural or pericardial effusion noted. Hepatobiliary: A TIPS shunt is identified. Morphologic features of liver compatible with cirrhosis noted. No focal liver abnormality identified within the limitations of noncontrast thickening. Previous cholecystectomy. No biliary  dilatation. Pancreas: No mass or inflammatory process identified on this un-enhanced exam. Spleen: The spleen is enlarged measuring 14 cm in length. Adrenals/Urinary Tract: Normal adrenal glands. No focal kidney abnormality identified. Urinary bladder appears normal for degree of distention. Stomach/Bowel: Normal appearance of the stomach. Right lower quadrant ileostomy is identified. Enteric contrast material is identified throughout the small bowel loops up to the level of the ileostomy. Again noted is a ventral abdominal wall hernia which contains a loop of small bowel. This does not scratch set there does not appear to be a high-grade obstruction associated with this hernia at this time. Hartman's pouch is unremarkable. Vascular/Lymphatic: Normal appearance of the abdominal aorta. No enlarged retroperitoneal or mesenteric adenopathy. No enlarged pelvic or inguinal lymph nodes. Reproductive: No mass or other significant abnormality. Other: None. Musculoskeletal: Spondylosis noted within the lower thoracic and lumbar spine. IMPRESSION: 1. No acute findings and no explanation for patient's left flank pain. No kidney stones or ureteral calculi identified at this time. 2. Ventral  abdominal wall hernia is again noted containing a nonobstructed loop of small bowel. 3. Right lower quadrant ileostomy. 4. 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 Electronically Signed   By: Signa Kell M.D.   On: 05/21/2015 17:17   Dg Chest 2 View  05/21/2015  CLINICAL DATA:  Weakness, cirrhosis, kidney disease EXAM: CHEST  2 VIEW COMPARISON:  04/16/2015 FINDINGS: Cardiomediastinal silhouette is stable. No infiltrate or pleural effusion. No pulmonary edema. Bony thorax is unremarkable. IMPRESSION: No active cardiopulmonary disease. Electronically Signed   By: Natasha Mead M.D.   On: 05/21/2015 13:42   Dg Chest Port 1 View  06/10/2015  CLINICAL DATA:  Status post PICC Line placement. Weakness, dizziness. History of HTN, Renal insufficiency, crohn's, cirrhosis. EXAM: PORTABLE CHEST 1 VIEW COMPARISON:  06/08/2015 FINDINGS: Right-sided central line, likely from an internal jugular approach. This terminates at the low SVC. Midline trachea. Normal heart size. Atherosclerosis in the transverse aorta. No pleural effusion or pneumothorax. Clear lungs. IMPRESSION: Right internal jugular line with tip at low SVC.  No pneumothorax. Aortic atherosclerosis. Electronically Signed   By: Jeronimo Greaves M.D.   On: 06/10/2015 16:39   Dg Chest Port 1 View  06/08/2015  CLINICAL DATA:  Generalized weakness and nausea for 2 days. Initial encounter. EXAM: PORTABLE CHEST 1 VIEW COMPARISON:  PA and lateral chest 05/21/2015. Single view of the chest 04/16/2015. FINDINGS: The lungs are clear. Heart size is normal. No pneumothorax or pleural effusion. No focal bony abnormality. IMPRESSION: Negative chest. Electronically Signed   By: Drusilla Kanner M.D.   On: 06/08/2015 13:25    Microbiology: Recent Results (from the past 240 hour(s))  Urine culture     Status: Abnormal   Collection Time: 06/08/15 11:46 AM  Result Value Ref Range Status   Specimen Description URINE, CATHETERIZED  Final   Special Requests NONE  Final   Culture (A)   Final    3,000 COLONIES/mL INSIGNIFICANT GROWTH Performed at [facility]    Report Status 06/10/2015 FINAL  Final  Blood Culture (routine x 2)     Status: None   Collection Time: 06/08/15 12:00 PM  Result Value Ref Range Status   Specimen Description BLOOD LEFT ANTECUBITAL DRAWN BY RN TV  Final   Special Requests BOTTLES DRAWN AEROBIC AND ANAEROBIC 6CC  Final   Culture NO GROWTH 5 DAYS  Final   Report Status 06/13/2015 FINAL  Final  Blood Culture (routine x 2)     Status: None  Collection Time: 06/08/15 12:05 PM  Result Value Ref Range Status   Specimen Description BLOOD RIGHT ANTECUBITAL  Final   Special Requests BOTTLES DRAWN AEROBIC AND ANAEROBIC [facility]  Final   Culture NO GROWTH 5 DAYS  Final   Report Status 06/13/2015 FINAL  Final     Labs: Basic Metabolic Panel:  Recent Labs Lab 06/10/15 0618 06/10/15 1800 06/11/15 0616 06/12/15 0806 06/13/15 0543 06/14/15 0809  NA 127*  --  127* 126* 127* 129*  K 3.2*  --  3.3* 3.1* 2.7* 2.9*  CL 109  --  108 108 109 114*  CO2 13*  --  14* 12* 12* 11*  GLUCOSE 98  --  88 121* 135* 98  BUN 62*  --  58* 52* 50* 41*  CREATININE 2.81*  --  2.36* 2.38* 2.07* 1.56*  CALCIUM 8.1*  --  7.9* 7.7* 7.9* 7.1*  MG  --  1.9  --   --   --   --    Liver Function Tests:  Recent Labs Lab 06/08/15 1130 06/09/15 0517  AST 29 26  ALT 22 17  ALKPHOS 120 99  BILITOT 1.2 0.7  PROT 5.7* 4.4*  ALBUMIN 2.9* 2.2*   No results for input(s): LIPASE, AMYLASE in the last 168 hours.  Recent Labs Lab 06/08/15 1130  AMMONIA 15   CBC:  Recent Labs Lab 06/08/15 1130 06/09/15 0517 06/11/15 0616  WBC 9.5 7.7 6.7  NEUTROABS 6.6  --   --   HGB 10.8* 9.0* 8.6*  HCT 30.4* 25.7* 24.7*  MCV 86.1 87.7 87.0  PLT 129* 91* 86*   Cardiac Enzymes: No results for input(s): CKTOTAL, CKMB, CKMBINDEX, TROPONINI in the last 168 hours. BNP: BNP (last 3 results)  Recent Labs  03/26/15 0345  BNP 74.0    ProBNP (last 3 results) No results for  input(s): PROBNP in the last 8760 hours.  CBG: No results for input(s): GLUCAP in the last 168 hours.     SignedChaya Jan  [facility] Pager: [phone] 06/14/2015, 11:48 AM

## 2015-06-19 ENCOUNTER — Ambulatory Visit (INDEPENDENT_AMBULATORY_CARE_PROVIDER_SITE_OTHER): Payer: Medicare HMO | Admitting: Family Medicine

## 2015-06-19 ENCOUNTER — Encounter: Payer: Self-pay | Admitting: Family Medicine

## 2015-06-19 VITALS — BP 105/68 | HR 81 | Temp 98.6°F | Ht 65.0 in | Wt 185.0 lb

## 2015-06-19 DIAGNOSIS — I8311 Varicose veins of right lower extremity with inflammation: Secondary | ICD-10-CM

## 2015-06-19 DIAGNOSIS — N183 Chronic kidney disease, stage 3 unspecified: Secondary | ICD-10-CM

## 2015-06-19 DIAGNOSIS — R601 Generalized edema: Secondary | ICD-10-CM | POA: Diagnosis not present

## 2015-06-19 DIAGNOSIS — K7469 Other cirrhosis of liver: Secondary | ICD-10-CM

## 2015-06-19 DIAGNOSIS — I8312 Varicose veins of left lower extremity with inflammation: Secondary | ICD-10-CM | POA: Diagnosis not present

## 2015-06-19 DIAGNOSIS — I872 Venous insufficiency (chronic) (peripheral): Secondary | ICD-10-CM

## 2015-06-19 NOTE — Progress Notes (Signed)
 BP 105/68 mmHg  Pulse 81  Temp(Src) 98.6 F (37 C) (Oral)  Ht 5' 5" (1.651 m)  Wt 185 lb (83.915 kg)  BMI 30.79 kg/m2   Subjective:    Patient ID: Kathryn [name], female    DOB: 07/08/1955, 60 y.o.   MRN: [id number]  HPI: Kathryn [name] is a 60 y.o. female presenting on 06/19/2015 for swelling in arms and legs   HPI Swelling in arms and legs Patient has significant swelling in arms and legs.She has weeping from both her arms and her legs but is been more significant in her legs. 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 Her hepatitis C has been treated but her liver was quite damaged by it before was treated. Her kidney function did improve after her last hospital visit. For these issues she has been in and out of the hospital about every month over the past 5-6 months. Her husband is here with her today as well and wants to know why she is also an worse between her liver or kidneys. She has a gastroenterologist that she sees for her liver problems. She has never seen a nephrologist. She is currently using Lasix but had held after her last hospital stay. She is also on lactulose and mesalamine and a beta blocker is taking potassium supplements. She is also on spironolactone for her liver as well. She denies any shortness of breath or cough or chest pains.  Relevant past medical, surgical, family and social history reviewed and updated as indicated. Interim medical history since our last visit reviewed. Allergies and medications reviewed and updated.  Review of Systems  Constitutional: Negative for fever and chills.  HENT: Negative for congestion, ear discharge and ear pain.   Eyes: Negative for redness and visual disturbance.  Respiratory: Negative for chest tightness and shortness of breath.   Cardiovascular: Positive for leg swelling. Negative for chest pain.  Gastrointestinal: Positive for abdominal distention. Negative for nausea,  vomiting, abdominal pain, diarrhea, constipation and blood in stool.  Genitourinary: Negative for dysuria, frequency, hematuria and difficulty urinating.  Musculoskeletal: Negative for back pain and gait problem.  Skin: Positive for wound. Negative for color change and rash.  Neurological: Negative for light-headedness and headaches.  Psychiatric/Behavioral: Negative for behavioral problems and agitation.  All other systems reviewed and are negative.   Per HPI unless specifically indicated above     Medication List       This list is accurate as of: 06/19/15  3:04 PM.  Always use your most recent med list.               ALPRAZolam 1 MG tablet  Commonly known as:  XANAX  TAKE 1 TABLET BY MOUTH AT BEDTIME AS NEEDED     feeding supplement (ENSURE ENLIVE) Liqd  Take 237 mLs by mouth daily.     furosemide 20 MG tablet  Commonly known as:  LASIX  Take 1 tablet (20 mg total) by mouth daily.     gabapentin 100 MG capsule  Commonly known as:  NEURONTIN  Take 1 capsule (100 mg total) by mouth 3 (three) times daily.     lactulose 10 GM/15ML solution  Commonly known as:  CHRONULAC  Take 30 mLs (20 g total) by mouth 2 (two) times daily.     mesalamine 1000 MG suppository  Commonly known as:  CANASA  Place 1 suppository (1,000 mg total) rectally at bedtime.       BP 105/68 mmHg  Pulse 81  Temp(Src) 98.6 F (37 C) (Oral)  Ht 5' 5" (1.651 m)  Wt 185 lb (83.915 kg)  BMI 30.79 kg/m2   Subjective:    Patient ID: Kathryn [name], female    DOB: 1955/11/21, 60 y.o.   MRN: [id number]  HPI: Kathryn [name] is a 60 y.o. female presenting on 06/19/2015 for swelling in arms and legs   HPI Swelling in arms and legs Patient has significant swelling in arms and legs.She has weeping from both her arms and her legs but is been more significant in her legs. 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 Her hepatitis C has been treated but her liver was quite damaged by it before was treated. Her kidney function did improve after her last hospital visit. For these issues she has been in and out of the hospital about every month over the past 5-6 months. Her husband is here with her today as well and wants to know why she is also an worse between her liver or kidneys. She has a gastroenterologist that she sees for her liver problems. She has never seen a nephrologist. She is currently using Lasix but had held after her last hospital stay. She is also on lactulose and mesalamine and a beta blocker is taking potassium supplements. She is also on spironolactone for her liver as well. She denies any shortness of breath or cough or chest pains.  Relevant past medical, surgical, family and social history reviewed and updated as indicated. Interim medical history since our last visit reviewed. Allergies and medications reviewed and updated.  Review of Systems  Constitutional: Negative for fever and chills.  HENT: Negative for congestion, ear discharge and ear pain.   Eyes: Negative for redness and visual disturbance.  Respiratory: Negative for chest tightness and shortness of breath.   Cardiovascular: Positive for leg swelling. Negative for chest pain.  Gastrointestinal: Positive for abdominal distention. Negative for nausea,  vomiting, abdominal pain, diarrhea, constipation and blood in stool.  Genitourinary: Negative for dysuria, frequency, hematuria and difficulty urinating.  Musculoskeletal: Negative for back pain and gait problem.  Skin: Positive for wound. Negative for color change and rash.  Neurological: Negative for light-headedness and headaches.  Psychiatric/Behavioral: Negative for behavioral problems and agitation.  All other systems reviewed and are negative.   Per HPI unless specifically indicated above     Medication List       This list is accurate as of: 06/19/15  3:04 PM.  Always use your most recent med list.               ALPRAZolam 1 MG tablet  Commonly known as:  XANAX  TAKE 1 TABLET BY MOUTH AT BEDTIME AS NEEDED     feeding supplement (ENSURE ENLIVE) Liqd  Take 237 mLs by mouth daily.     furosemide 20 MG tablet  Commonly known as:  LASIX  Take 1 tablet (20 mg total) by mouth daily.     gabapentin 100 MG capsule  Commonly known as:  NEURONTIN  Take 1 capsule (100 mg total) by mouth 3 (three) times daily.     lactulose 10 GM/15ML solution  Commonly known as:  CHRONULAC  Take 30 mLs (20 g total) by mouth 2 (two) times daily.     mesalamine 1000 MG suppository  Commonly known as:  CANASA  Place 1 suppository (1,000 mg total) rectally at bedtime.   BP 105/68 mmHg  Pulse 81  Temp(Src) 98.6 F (37 C) (Oral)  Ht 5' 5" (1.651 m)  Wt 185 lb (83.915 kg)  BMI 30.79 kg/m2   Subjective:    Patient ID: Kathryn [name], female    DOB: 07/08/1955, 60 y.o.   MRN: [id number]  HPI: Kathryn [name] is a 60 y.o. female presenting on 06/19/2015 for swelling in arms and legs   HPI Swelling in arms and legs Patient has significant swelling in arms and legs.She has weeping from both her arms and her legs but is been more significant in her legs. 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 Her hepatitis C has been treated but her liver was quite damaged by it before was treated. Her kidney function did improve after her last hospital visit. For these issues she has been in and out of the hospital about every month over the past 5-6 months. Her husband is here with her today as well and wants to know why she is also an worse between her liver or kidneys. She has a gastroenterologist that she sees for her liver problems. She has never seen a nephrologist. She is currently using Lasix but had held after her last hospital stay. She is also on lactulose and mesalamine and a beta blocker is taking potassium supplements. She is also on spironolactone for her liver as well. She denies any shortness of breath or cough or chest pains.  Relevant past medical, surgical, family and social history reviewed and updated as indicated. Interim medical history since our last visit reviewed. Allergies and medications reviewed and updated.  Review of Systems  Constitutional: Negative for fever and chills.  HENT: Negative for congestion, ear discharge and ear pain.   Eyes: Negative for redness and visual disturbance.  Respiratory: Negative for chest tightness and shortness of breath.   Cardiovascular: Positive for leg swelling. Negative for chest pain.  Gastrointestinal: Positive for abdominal distention. Negative for nausea,  vomiting, abdominal pain, diarrhea, constipation and blood in stool.  Genitourinary: Negative for dysuria, frequency, hematuria and difficulty urinating.  Musculoskeletal: Negative for back pain and gait problem.  Skin: Positive for wound. Negative for color change and rash.  Neurological: Negative for light-headedness and headaches.  Psychiatric/Behavioral: Negative for behavioral problems and agitation.  All other systems reviewed and are negative.   Per HPI unless specifically indicated above     Medication List       This list is accurate as of: 06/19/15  3:04 PM.  Always use your most recent med list.               ALPRAZolam 1 MG tablet  Commonly known as:  XANAX  TAKE 1 TABLET BY MOUTH AT BEDTIME AS NEEDED     feeding supplement (ENSURE ENLIVE) Liqd  Take 237 mLs by mouth daily.     furosemide 20 MG tablet  Commonly known as:  LASIX  Take 1 tablet (20 mg total) by mouth daily.     gabapentin 100 MG capsule  Commonly known as:  NEURONTIN  Take 1 capsule (100 mg total) by mouth 3 (three) times daily.     lactulose 10 GM/15ML solution  Commonly known as:  CHRONULAC  Take 30 mLs (20 g total) by mouth 2 (two) times daily.     mesalamine 1000 MG suppository  Commonly known as:  CANASA  Place 1 suppository (1,000 mg total) rectally at bedtime.        BP 105/68 mmHg  Pulse 81  Temp(Src) 98.6 F (37 C) (Oral)  Ht 5' 5" (1.651 m)  Wt 185 lb (83.915 kg)  BMI 30.79 kg/m2   Subjective:    Patient ID: Kathryn [name], female    DOB: 07/08/1955, 60 y.o.   MRN: [id number]  HPI: Kathryn [name] is a 60 y.o. female presenting on 06/19/2015 for swelling in arms and legs   HPI Swelling in arms and legs Patient has significant swelling in arms and legs.She has weeping from both her arms and her legs but is been more significant in her legs. 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 Her hepatitis C has been treated but her liver was quite damaged by it before was treated. Her kidney function did improve after her last hospital visit. For these issues she has been in and out of the hospital about every month over the past 5-6 months. Her husband is here with her today as well and wants to know why she is also an worse between her liver or kidneys. She has a gastroenterologist that she sees for her liver problems. She has never seen a nephrologist. She is currently using Lasix but had held after her last hospital stay. She is also on lactulose and mesalamine and a beta blocker is taking potassium supplements. She is also on spironolactone for her liver as well. She denies any shortness of breath or cough or chest pains.  Relevant past medical, surgical, family and social history reviewed and updated as indicated. Interim medical history since our last visit reviewed. Allergies and medications reviewed and updated.  Review of Systems  Constitutional: Negative for fever and chills.  HENT: Negative for congestion, ear discharge and ear pain.   Eyes: Negative for redness and visual disturbance.  Respiratory: Negative for chest tightness and shortness of breath.   Cardiovascular: Positive for leg swelling. Negative for chest pain.  Gastrointestinal: Positive for abdominal distention. Negative for nausea,  vomiting, abdominal pain, diarrhea, constipation and blood in stool.  Genitourinary: Negative for dysuria, frequency, hematuria and difficulty urinating.  Musculoskeletal: Negative for back pain and gait problem.  Skin: Positive for wound. Negative for color change and rash.  Neurological: Negative for light-headedness and headaches.  Psychiatric/Behavioral: Negative for behavioral problems and agitation.  All other systems reviewed and are negative.   Per HPI unless specifically indicated above     Medication List       This list is accurate as of: 06/19/15  3:04 PM.  Always use your most recent med list.               ALPRAZolam 1 MG tablet  Commonly known as:  XANAX  TAKE 1 TABLET BY MOUTH AT BEDTIME AS NEEDED     feeding supplement (ENSURE ENLIVE) Liqd  Take 237 mLs by mouth daily.     furosemide 20 MG tablet  Commonly known as:  LASIX  Take 1 tablet (20 mg total) by mouth daily.     gabapentin 100 MG capsule  Commonly known as:  NEURONTIN  Take 1 capsule (100 mg total) by mouth 3 (three) times daily.     lactulose 10 GM/15ML solution  Commonly known as:  CHRONULAC  Take 30 mLs (20 g total) by mouth 2 (two) times daily.     mesalamine 1000 MG suppository  Commonly known as:  CANASA  Place 1 suppository (1,000 mg total) rectally at bedtime.       BP 105/68 mmHg  Pulse 81  Temp(Src) 98.6 F (37 C) (Oral)  Ht 5' 5" (1.651 m)  Wt 185 lb (83.915 kg)  BMI 30.79 kg/m2   Subjective:    Patient ID: Kathryn [name], female    DOB: 1955/11/21, 60 y.o.   MRN: [id number]  HPI: Kathryn [name] is a 60 y.o. female presenting on 06/19/2015 for swelling in arms and legs   HPI Swelling in arms and legs Patient has significant swelling in arms and legs.She has weeping from both her arms and her legs but is been more significant in her legs. 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 Her hepatitis C has been treated but her liver was quite damaged by it before was treated. Her kidney function did improve after her last hospital visit. For these issues she has been in and out of the hospital about every month over the past 5-6 months. Her husband is here with her today as well and wants to know why she is also an worse between her liver or kidneys. She has a gastroenterologist that she sees for her liver problems. She has never seen a nephrologist. She is currently using Lasix but had held after her last hospital stay. She is also on lactulose and mesalamine and a beta blocker is taking potassium supplements. She is also on spironolactone for her liver as well. She denies any shortness of breath or cough or chest pains.  Relevant past medical, surgical, family and social history reviewed and updated as indicated. Interim medical history since our last visit reviewed. Allergies and medications reviewed and updated.  Review of Systems  Constitutional: Negative for fever and chills.  HENT: Negative for congestion, ear discharge and ear pain.   Eyes: Negative for redness and visual disturbance.  Respiratory: Negative for chest tightness and shortness of breath.   Cardiovascular: Positive for leg swelling. Negative for chest pain.  Gastrointestinal: Positive for abdominal distention. Negative for nausea,  vomiting, abdominal pain, diarrhea, constipation and blood in stool.  Genitourinary: Negative for dysuria, frequency, hematuria and difficulty urinating.  Musculoskeletal: Negative for back pain and gait problem.  Skin: Positive for wound. Negative for color change and rash.  Neurological: Negative for light-headedness and headaches.  Psychiatric/Behavioral: Negative for behavioral problems and agitation.  All other systems reviewed and are negative.   Per HPI unless specifically indicated above     Medication List       This list is accurate as of: 06/19/15  3:04 PM.  Always use your most recent med list.               ALPRAZolam 1 MG tablet  Commonly known as:  XANAX  TAKE 1 TABLET BY MOUTH AT BEDTIME AS NEEDED     feeding supplement (ENSURE ENLIVE) Liqd  Take 237 mLs by mouth daily.     furosemide 20 MG tablet  Commonly known as:  LASIX  Take 1 tablet (20 mg total) by mouth daily.     gabapentin 100 MG capsule  Commonly known as:  NEURONTIN  Take 1 capsule (100 mg total) by mouth 3 (three) times daily.     lactulose 10 GM/15ML solution  Commonly known as:  CHRONULAC  Take 30 mLs (20 g total) by mouth 2 (two) times daily.     mesalamine 1000 MG suppository  Commonly known as:  CANASA  Place 1 suppository (1,000 mg total) rectally at bedtime.

## 2015-06-22 ENCOUNTER — Telehealth: Payer: Self-pay | Admitting: Family

## 2015-06-22 NOTE — Telephone Encounter (Signed)
Patient will come back and have labs drawn.

## 2015-06-23 ENCOUNTER — Ambulatory Visit: Payer: Medicare HMO | Admitting: Family

## 2015-06-23 ENCOUNTER — Encounter (INDEPENDENT_AMBULATORY_CARE_PROVIDER_SITE_OTHER): Payer: Self-pay | Admitting: Internal Medicine

## 2015-06-23 ENCOUNTER — Ambulatory Visit (INDEPENDENT_AMBULATORY_CARE_PROVIDER_SITE_OTHER): Payer: Medicare HMO | Admitting: Internal Medicine

## 2015-06-23 ENCOUNTER — Other Ambulatory Visit: Payer: Self-pay | Admitting: Family

## 2015-06-23 ENCOUNTER — Other Ambulatory Visit: Payer: Medicare HMO

## 2015-06-23 VITALS — BP 110/70 | HR 60 | Temp 98.0°F | Ht 65.0 in | Wt 205.0 lb

## 2015-06-23 DIAGNOSIS — D649 Anemia, unspecified: Secondary | ICD-10-CM

## 2015-06-23 DIAGNOSIS — K5010 Crohn's disease of large intestine without complications: Secondary | ICD-10-CM

## 2015-06-23 DIAGNOSIS — K7460 Unspecified cirrhosis of liver: Secondary | ICD-10-CM

## 2015-06-23 LAB — COAGUCHEK XS/INR WAIVED
INR: 1.1 (ref 0.9–1.1)
PROTHROMBIN TIME: 13.6 s

## 2015-06-23 NOTE — Telephone Encounter (Signed)
Patient would like order for skilled nursing and aide. Insurance will only cover [facility].

## 2015-06-23 NOTE — Progress Notes (Signed)
Subjective:    Patient ID: Kathryn [name], female    DOB: 03-27-1955, 60 y.o.   MRN: [id number]  HPI Presents today stating her liver is much worse. She says she can't get up and down. She is weak. Hx of cirrhosis. Hx of Hepatitis C successfully treated.  She says she has bleeding from the bruising from her arms. She has 4+ edema to her lower extremities.  She had blood work drawn in [place]. [facility] and [facility]. Recently admitted to [facility] with acute renal failure, dizziness upon standing and weakness.   Hx of Crohn's disease and empties Ileostomy 4-5 times a day.   Hx of stage 111 renal disease. Has appt with Dr. Derinda Late this month for her kidney disease. She is very forgetful per husband.   CMP Latest Ref Rng 06/14/2015 06/13/2015 06/12/2015  Glucose 65 - 99 mg/dL 98 469(G) 295(M)  BUN 6 - 20 mg/dL 84(X) 32(G) 40(N)  Creatinine 0.44 - 1.00 mg/dL 0.27(O) 5.36(U) 4.40(H)  Sodium 135 - 145 mmol/L 129(L) 127(L) 126(L)  Potassium 3.5 - 5.1 mmol/L 2.9(L) 2.7(LL) 3.1(L)  Chloride 101 - 111 mmol/L 114(H) 109 108  CO2 22 - 32 mmol/L 11(L) 12(L) 12(L)  Calcium 8.9 - 10.3 mg/dL 7.1(L) 7.9(L) 7.7(L)  Total Protein 6.5 - 8.1 g/dL - - -  Total Bilirubin 0.3 - 1.2 mg/dL - - -  Alkaline Phos 38 - 126 U/L - - -  AST 15 - 41 U/L - - -  ALT 14 - 54 U/L - - -   CBC Latest Ref Rng 06/11/2015 06/09/2015 06/08/2015  WBC 4.0 - 10.5 K/uL 6.7 7.7 9.5  Hemoglobin 12.0 - 15.0 g/dL 4.7(Q) 2.5(Z) 10.8(L)  Hematocrit 36.0 - 46.0 % 24.7(L) 25.7(L) 30.4(L)  Platelets 150 - 400 K/uL 86(L) 91(L) 129(L)   Hepatic Function Panel     Component Value Date/Time   PROT 4.4* 06/09/2015 0517   PROT 5.6* 05/19/2015 1447   ALBUMIN 2.2* 06/09/2015 0517   ALBUMIN 3.1* 05/19/2015 1447   AST 26 06/09/2015 0517   ALT 17 06/09/2015 0517   ALKPHOS 99 06/09/2015 0517   BILITOT 0.7 06/09/2015 0517   BILITOT 1.8* 05/19/2015 1447   BILIDIR 0.65* 05/19/2015 1447   BILIDIR 0.4* 04/30/2015 1424   IBILI 0.9 04/30/2015 1424          Review of Systems Past Medical History  Diagnosis Date  . Hypertension   . Enteritis presumed infectious   . Crohn's   . Anemia   . Hepatitis C antibody test positive   . Hepatitis   . Renal insufficiency   . Cirrhosis (HCC)   . Splenomegaly   . Bilateral leg edema 01/2014  . Chronic back pain   . Thrombocytopenia [facility])     Past Surgical History  Procedure Laterality Date  . Colon surgery      MULTIPLE SURGERIES FOR CROHNS  . Ileostomy      multiple abdominal surgeries for Crohn's by Dr. Gabriel Cirri  . Cholecystectomy    . Abscess drainage      abdominal  . Esophagogastroduodenoscopy  10/05/2010    Procedure: ESOPHAGOGASTRODUODENOSCOPY (EGD);  Surgeon: [name], MD;  Location: [facility];  Service: Endoscopy;  Laterality: N/A;  7:30 am  . Cirrhosis    . Esophagogastroduodenoscopy N/A 09/11/2013    Procedure: ESOPHAGOGASTRODUODENOSCOPY (EGD);  Surgeon: [name], MD;  Location: [facility];  Service: Endoscopy;  Laterality: N/A;  . Ileoscopy  09/11/2013    Procedure: ILEOSCOPY THROUGH  STOMA;  Surgeon: [name], MD;  Location: [facility];  Service: Endoscopy;;  . Givens capsule study N/A 09/12/2013    Procedure: GIVENS CAPSULE STUDY;  Surgeon: [name], MD;  Location: [facility];  Service: Endoscopy;  Laterality: N/A;  . Givens capsule study N/A 11/25/2013    Procedure: GIVENS CAPSULE STUDY;  Surgeon: [name], MD;  Location: [facility];  Service: Endoscopy;  Laterality: N/A;  . Varices cauterization Right     in the stoma of her ileostomy  . Tips procedure  11/2013    [facility]     Allergies  Allergen Reactions  . Penicillins Rash    Has patient had a PCN reaction causing immediate rash, facial/tongue/throat swelling, SOB or lightheadedness with hypotension: Yes Has patient had a PCN reaction causing severe rash involving mucus membranes or skin necrosis: No Has patient had a PCN reaction that required hospitalization No Has patient  had a PCN reaction occurring within the last 10 years: No If all of the above answers are "NO", then may proceed with Cephalosporin use.     Current Outpatient Prescriptions on File Prior to Visit  Medication Sig Dispense Refill  . ALPRAZolam (XANAX) 1 MG tablet TAKE 1 TABLET BY MOUTH AT BEDTIME AS NEEDED 30 tablet 2  . Cholecalciferol (VITAMIN D-1000 MAX ST) 1000 UNITS tablet Take 1,000 Units by mouth daily.    . feeding supplement, ENSURE ENLIVE, (ENSURE ENLIVE) LIQD Take 237 mLs by mouth daily.    Marland Kitchen gabapentin (NEURONTIN) 100 MG capsule Take 1 capsule (100 mg total) by mouth 3 (three) times daily. 270 capsule 0  . lactulose (CHRONULAC) 10 GM/15ML solution Take 30 mLs (20 g total) by mouth 2 (two) times daily. 1892 mL 1  . mesalamine (CANASA) 1000 MG suppository Place 1 suppository (1,000 mg total) rectally at bedtime. 30 suppository 12  . Multiple Vitamins-Minerals (MULTIVITAMIN WITH MINERALS) tablet Take 1 tablet by mouth daily.    . nadolol (CORGARD) 20 MG tablet Take 0.5 tablets (10 mg total) by mouth daily. 45 tablet 1  . pantoprazole (PROTONIX) 40 MG tablet TAKE 1 TABLET BY MOUTH DAILY 30 tablet 3  . potassium chloride 20 MEQ TBCR Take 40 mEq by mouth 2 (two) times daily. For 3 days. Afterwards, take your home dose of 20 meq daily. 12 tablet 0  . Probiotic Product (PROBIOTIC PO) Take 1 tablet by mouth daily.    Marland Kitchen spironolactone (ALDACTONE) 25 MG tablet Take 25 mg by mouth once.     . furosemide (LASIX) 20 MG tablet Take 1 tablet (20 mg total) by mouth daily. (Patient not taking: Reported on 06/23/2015) 30 tablet 1  . potassium chloride (KLOR-CON) 20 MEQ packet Take 20 mEq by mouth daily. (Patient not taking: Reported on 06/19/2015) 30 packet 6   No current facility-administered medications on file prior to visit.        Objective:   Physical Exam Blood pressure 110/70, pulse 60, temperature 98 F (36.7 C), height 5\' 5"  (1.651 m), weight 205 lb (92.987 kg). Alert and oriented. Skin  warm and dry. Oral mucosa is moist.   . Sclera anicteric, conjunctivae is pink. Thyroid not enlarged. No cervical lymphadenopathy. Lungs clear. Heart regular rate and rhythm.  Abdomen is soft. Bowel sounds are positive. No hepatomegaly. No abdominal masses felt. No tenderness.  4+ edema to lower extremities.  Ileostomy bag with yellow brown stool. Bruising to both arms.     Assessment & Plan: cirrhosis/Hepatitis C.  CBC, Hepatic function, ammonia today. Thrombocytopenia related to her liver disease.  Advised to keep apt with Nephrologist; OV in 3 months.   ccc cc

## 2015-06-23 NOTE — Patient Instructions (Addendum)
CBC, Hepatic function  today.

## 2015-06-24 ENCOUNTER — Encounter (INDEPENDENT_AMBULATORY_CARE_PROVIDER_SITE_OTHER): Payer: Self-pay | Admitting: *Deleted

## 2015-06-24 ENCOUNTER — Encounter (HOSPITAL_COMMUNITY): Payer: Self-pay

## 2015-06-24 ENCOUNTER — Other Ambulatory Visit: Payer: Self-pay | Admitting: *Deleted

## 2015-06-24 ENCOUNTER — Telehealth (INDEPENDENT_AMBULATORY_CARE_PROVIDER_SITE_OTHER): Payer: Self-pay | Admitting: Internal Medicine

## 2015-06-24 ENCOUNTER — Inpatient Hospital Stay (HOSPITAL_COMMUNITY)
Admission: EM | Admit: 2015-06-24 | Discharge: 2015-07-02 | DRG: 442 | Disposition: A | Discharge status: Home / Self Care | Payer: Medicare HMO | Attending: Internal Medicine | Admitting: Internal Medicine

## 2015-06-24 DIAGNOSIS — B952 Enterococcus as the cause of diseases classified elsewhere: Secondary | ICD-10-CM | POA: Diagnosis not present

## 2015-06-24 DIAGNOSIS — F419 Anxiety disorder, unspecified: Secondary | ICD-10-CM | POA: Diagnosis present

## 2015-06-24 DIAGNOSIS — D649 Anemia, unspecified: Secondary | ICD-10-CM | POA: Insufficient documentation

## 2015-06-24 DIAGNOSIS — K219 Gastro-esophageal reflux disease without esophagitis: Secondary | ICD-10-CM | POA: Diagnosis present

## 2015-06-24 DIAGNOSIS — D631 Anemia in chronic kidney disease: Secondary | ICD-10-CM | POA: Diagnosis present

## 2015-06-24 DIAGNOSIS — G4700 Insomnia, unspecified: Secondary | ICD-10-CM | POA: Diagnosis not present

## 2015-06-24 DIAGNOSIS — N183 Chronic kidney disease, stage 3 unspecified: Secondary | ICD-10-CM | POA: Diagnosis present

## 2015-06-24 DIAGNOSIS — K7460 Unspecified cirrhosis of liver: Secondary | ICD-10-CM

## 2015-06-24 DIAGNOSIS — E876 Hypokalemia: Secondary | ICD-10-CM | POA: Diagnosis not present

## 2015-06-24 DIAGNOSIS — K7469 Other cirrhosis of liver: Secondary | ICD-10-CM

## 2015-06-24 DIAGNOSIS — N179 Acute kidney failure, unspecified: Secondary | ICD-10-CM | POA: Diagnosis present

## 2015-06-24 DIAGNOSIS — I129 Hypertensive chronic kidney disease with stage 1 through stage 4 chronic kidney disease, or unspecified chronic kidney disease: Secondary | ICD-10-CM | POA: Diagnosis present

## 2015-06-24 DIAGNOSIS — R609 Edema, unspecified: Secondary | ICD-10-CM

## 2015-06-24 DIAGNOSIS — E861 Hypovolemia: Secondary | ICD-10-CM | POA: Diagnosis present

## 2015-06-24 DIAGNOSIS — I472 Ventricular tachycardia: Secondary | ICD-10-CM | POA: Diagnosis not present

## 2015-06-24 DIAGNOSIS — E7220 Disorder of urea cycle metabolism, unspecified: Secondary | ICD-10-CM

## 2015-06-24 DIAGNOSIS — H1131 Conjunctival hemorrhage, right eye: Secondary | ICD-10-CM | POA: Diagnosis present

## 2015-06-24 DIAGNOSIS — E878 Other disorders of electrolyte and fluid balance, not elsewhere classified: Secondary | ICD-10-CM | POA: Diagnosis present

## 2015-06-24 DIAGNOSIS — R7989 Other specified abnormal findings of blood chemistry: Secondary | ICD-10-CM

## 2015-06-24 DIAGNOSIS — B1920 Unspecified viral hepatitis C without hepatic coma: Secondary | ICD-10-CM | POA: Diagnosis present

## 2015-06-24 DIAGNOSIS — Z880 Allergy status to penicillin: Secondary | ICD-10-CM

## 2015-06-24 DIAGNOSIS — D638 Anemia in other chronic diseases classified elsewhere: Secondary | ICD-10-CM | POA: Diagnosis present

## 2015-06-24 DIAGNOSIS — Z9049 Acquired absence of other specified parts of digestive tract: Secondary | ICD-10-CM

## 2015-06-24 DIAGNOSIS — E872 Acidosis: Secondary | ICD-10-CM | POA: Diagnosis present

## 2015-06-24 DIAGNOSIS — R600 Localized edema: Secondary | ICD-10-CM | POA: Diagnosis present

## 2015-06-24 DIAGNOSIS — K7581 Nonalcoholic steatohepatitis (NASH): Secondary | ICD-10-CM | POA: Diagnosis present

## 2015-06-24 DIAGNOSIS — N390 Urinary tract infection, site not specified: Secondary | ICD-10-CM | POA: Diagnosis present

## 2015-06-24 DIAGNOSIS — E860 Dehydration: Secondary | ICD-10-CM | POA: Diagnosis present

## 2015-06-24 DIAGNOSIS — K5090 Crohn's disease, unspecified, without complications: Secondary | ICD-10-CM | POA: Diagnosis present

## 2015-06-24 DIAGNOSIS — D689 Coagulation defect, unspecified: Secondary | ICD-10-CM | POA: Diagnosis present

## 2015-06-24 DIAGNOSIS — K7290 Hepatic failure, unspecified without coma: Secondary | ICD-10-CM | POA: Diagnosis present

## 2015-06-24 DIAGNOSIS — R531 Weakness: Secondary | ICD-10-CM

## 2015-06-24 DIAGNOSIS — Z932 Ileostomy status: Secondary | ICD-10-CM

## 2015-06-24 DIAGNOSIS — I9589 Other hypotension: Secondary | ICD-10-CM | POA: Diagnosis present

## 2015-06-24 DIAGNOSIS — D696 Thrombocytopenia, unspecified: Secondary | ICD-10-CM | POA: Diagnosis present

## 2015-06-24 DIAGNOSIS — E8770 Fluid overload, unspecified: Secondary | ICD-10-CM | POA: Diagnosis not present

## 2015-06-24 DIAGNOSIS — D508 Other iron deficiency anemias: Secondary | ICD-10-CM

## 2015-06-24 DIAGNOSIS — T502X5A Adverse effect of carbonic-anhydrase inhibitors, benzothiadiazides and other diuretics, initial encounter: Secondary | ICD-10-CM | POA: Diagnosis present

## 2015-06-24 DIAGNOSIS — R410 Disorientation, unspecified: Secondary | ICD-10-CM

## 2015-06-24 DIAGNOSIS — K7682 Hepatic encephalopathy: Secondary | ICD-10-CM | POA: Diagnosis present

## 2015-06-24 DIAGNOSIS — D61818 Other pancytopenia: Secondary | ICD-10-CM | POA: Diagnosis present

## 2015-06-24 DIAGNOSIS — E871 Hypo-osmolality and hyponatremia: Secondary | ICD-10-CM

## 2015-06-24 DIAGNOSIS — D684 Acquired coagulation factor deficiency: Secondary | ICD-10-CM | POA: Diagnosis present

## 2015-06-24 LAB — URINE MICROSCOPIC-ADD ON: RBC / HPF: NONE SEEN RBC/hpf (ref 0–5)

## 2015-06-24 LAB — CBC WITH DIFFERENTIAL/PLATELET
Basophils Absolute: 0 cells/uL (ref 0–200)
Basophils Relative: 0 %
EOS PCT: 3 %
Eosinophils Absolute: 135 cells/uL (ref 15–500)
HCT: 26.8 % — ABNORMAL LOW (ref 35.0–45.0)
HEMOGLOBIN: 8.5 g/dL — AB (ref 11.7–15.5)
LYMPHS ABS: 1395 {cells}/uL (ref 850–3900)
Lymphocytes Relative: 31 %
MCH: 29.7 pg (ref 27.0–33.0)
MCHC: 31.7 g/dL — AB (ref 32.0–36.0)
MCV: 93.7 fL (ref 80.0–100.0)
MPV: 10.7 fL (ref 7.5–12.5)
Monocytes Absolute: 450 cells/uL (ref 200–950)
Monocytes Relative: 10 %
NEUTROS ABS: 2520 {cells}/uL (ref 1500–7800)
NEUTROS PCT: 56 %
Platelets: 97 10*3/uL — ABNORMAL LOW (ref 140–400)
RBC: 2.86 MIL/uL — AB (ref 3.80–5.10)
RDW: 17.6 % — AB (ref 11.0–15.0)
WBC: 4.5 10*3/uL (ref 3.8–10.8)

## 2015-06-24 LAB — CMP14+EGFR
ALT: 19 IU/L (ref 0–32)
AST: 20 IU/L (ref 0–40)
Albumin/Globulin Ratio: 1.4 (ref 1.2–2.2)
Albumin: 2.7 g/dL — ABNORMAL LOW (ref 3.5–5.5)
Alkaline Phosphatase: 157 IU/L — ABNORMAL HIGH (ref 39–117)
BUN/Creatinine Ratio: 18 (ref 9–23)
BUN: 38 mg/dL — AB (ref 6–24)
Bilirubin Total: 0.9 mg/dL (ref 0.0–1.2)
CALCIUM: 8.9 mg/dL (ref 8.7–10.2)
CO2: 13 mmol/L — AB (ref 18–29)
CREATININE: 2.11 mg/dL — AB (ref 0.57–1.00)
Chloride: 103 mmol/L (ref 96–106)
GFR calc Af Amer: 29 mL/min/{1.73_m2} — ABNORMAL LOW (ref >59)
GFR, EST NON AFRICAN AMERICAN: 25 mL/min/{1.73_m2} — AB (ref >59)
GLUCOSE: 127 mg/dL — AB (ref 65–99)
Globulin, Total: 2 g/dL (ref 1.5–4.5)
POTASSIUM: 2.5 mmol/L — AB (ref 3.5–5.2)
Sodium: 133 mmol/L — ABNORMAL LOW (ref 134–144)
Total Protein: 4.7 g/dL — ABNORMAL LOW (ref 6.0–8.5)

## 2015-06-24 LAB — HEPATIC FUNCTION PANEL
ALT: 16 U/L (ref 6–29)
AST: 21 U/L (ref 10–35)
Albumin: 2.4 g/dL — ABNORMAL LOW (ref 3.6–5.1)
Alkaline Phosphatase: 127 U/L (ref 33–130)
BILIRUBIN DIRECT: 0.3 mg/dL — AB (ref <=0.2)
BILIRUBIN TOTAL: 1.2 mg/dL (ref 0.2–1.2)
Indirect Bilirubin: 0.9 mg/dL (ref 0.2–1.2)
Total Protein: 4.6 g/dL — ABNORMAL LOW (ref 6.1–8.1)

## 2015-06-24 LAB — COMPREHENSIVE METABOLIC PANEL
ALT: 22 U/L (ref 14–54)
AST: 26 U/L (ref 15–41)
Albumin: 2.5 g/dL — ABNORMAL LOW (ref 3.5–5.0)
Alkaline Phosphatase: 119 U/L (ref 38–126)
Anion gap: 5 (ref 5–15)
BUN: 43 mg/dL — AB (ref 6–20)
CALCIUM: 8.3 mg/dL — AB (ref 8.9–10.3)
CHLORIDE: 110 mmol/L (ref 101–111)
CO2: 14 mmol/L — ABNORMAL LOW (ref 22–32)
CREATININE: 2.06 mg/dL — AB (ref 0.44–1.00)
GFR, EST AFRICAN AMERICAN: 29 mL/min — AB (ref >60)
GFR, EST NON AFRICAN AMERICAN: 25 mL/min — AB (ref >60)
Glucose, Bld: 122 mg/dL — ABNORMAL HIGH (ref 65–99)
Potassium: 2.2 mmol/L — CL (ref 3.5–5.1)
Sodium: 129 mmol/L — ABNORMAL LOW (ref 135–145)
Total Bilirubin: 1 mg/dL (ref 0.3–1.2)
Total Protein: 4.9 g/dL — ABNORMAL LOW (ref 6.5–8.1)

## 2015-06-24 LAB — URINALYSIS, ROUTINE W REFLEX MICROSCOPIC
BILIRUBIN URINE: NEGATIVE
GLUCOSE, UA: NEGATIVE mg/dL
HGB URINE DIPSTICK: NEGATIVE
KETONES UR: NEGATIVE mg/dL
Nitrite: NEGATIVE
PH: 5.5 (ref 5.0–8.0)
Protein, ur: NEGATIVE mg/dL
SPECIFIC GRAVITY, URINE: 1.01 (ref 1.005–1.030)

## 2015-06-24 LAB — CBC
HCT: 25.2 % — ABNORMAL LOW (ref 36.0–46.0)
HEMOGLOBIN: 8.5 g/dL — AB (ref 12.0–15.0)
MCH: 30 pg (ref 26.0–34.0)
MCHC: 33.7 g/dL (ref 30.0–36.0)
MCV: 89 fL (ref 78.0–100.0)
PLATELETS: 92 10*3/uL — AB (ref 150–400)
RBC: 2.83 MIL/uL — AB (ref 3.87–5.11)
RDW: 18.1 % — ABNORMAL HIGH (ref 11.5–15.5)
WBC: 5.6 10*3/uL (ref 4.0–10.5)

## 2015-06-24 LAB — PROTIME-INR
INR: 1.5 — ABNORMAL HIGH (ref 0.00–1.49)
Prothrombin Time: 18.2 seconds — ABNORMAL HIGH (ref 11.6–15.2)

## 2015-06-24 LAB — AMMONIA
AMMONIA: 170 ug/dL — AB (ref 19–87)
Ammonia: 164 umol/L — ABNORMAL HIGH (ref 16–53)
Ammonia: 138 umol/L — ABNORMAL HIGH (ref 9–35)

## 2015-06-24 LAB — MAGNESIUM: Magnesium: 1.6 mg/dL — ABNORMAL LOW (ref 1.7–2.4)

## 2015-06-24 MED ORDER — ENSURE ENLIVE PO LIQD
237.0000 mL | ORAL | Status: DC
  Administered 2015-06-25 – 2015-06-29 (×6): 237 mL via ORAL

## 2015-06-24 MED ORDER — POTASSIUM CHLORIDE 10 MEQ/100ML IV SOLN
10.0000 meq | INTRAVENOUS | Status: DC
  Administered 2015-06-24 (×2): 10 meq via INTRAVENOUS
  Filled 2015-06-24 (×2): qty 100

## 2015-06-24 MED ORDER — HYDROCODONE-ACETAMINOPHEN 5-325 MG PO TABS: 1.0000 | ORAL_TABLET | ORAL | Status: DC | PRN

## 2015-06-24 MED ORDER — VITAMIN D3 25 MCG (1000 UNIT) PO TABS
1000.0000 [IU] | ORAL_TABLET | Freq: Every day | ORAL | Status: DC
  Administered 2015-06-25 – 2015-07-02 (×8): 1000 [IU] via ORAL
  Filled 2015-06-24 (×14): qty 1

## 2015-06-24 MED ORDER — LACTULOSE 10 GM/15ML PO SOLN
20.0000 g | Freq: Once | ORAL | Status: AC
  Administered 2015-06-24: 20 g via ORAL
  Filled 2015-06-24: qty 30

## 2015-06-24 MED ORDER — ONDANSETRON HCL 4 MG PO TABS: 4.0000 mg | ORAL_TABLET | Freq: Four times a day (QID) | ORAL | Status: DC | PRN

## 2015-06-24 MED ORDER — MAGNESIUM SULFATE 2 GM/50ML IV SOLN
2.0000 g | Freq: Once | INTRAVENOUS | Status: AC
  Administered 2015-06-24: 2 g via INTRAVENOUS
  Filled 2015-06-24: qty 50

## 2015-06-24 MED ORDER — SODIUM CHLORIDE 0.9% FLUSH: 3.0000 mL | INTRAVENOUS | Status: DC | PRN

## 2015-06-24 MED ORDER — PANTOPRAZOLE SODIUM 40 MG PO TBEC
40.0000 mg | DELAYED_RELEASE_TABLET | Freq: Every day | ORAL | Status: DC
  Administered 2015-06-25 – 2015-07-02 (×8): 40 mg via ORAL
  Filled 2015-06-24 (×8): qty 1

## 2015-06-24 MED ORDER — ONDANSETRON HCL 4 MG/2ML IJ SOLN: 4.0000 mg | Freq: Four times a day (QID) | INTRAMUSCULAR | Status: DC | PRN

## 2015-06-24 MED ORDER — HEPARIN SODIUM (PORCINE) 5000 UNIT/ML IJ SOLN
5000.0000 [IU] | Freq: Three times a day (TID) | INTRAMUSCULAR | Status: DC
  Administered 2015-06-24 – 2015-07-02 (×22): 5000 [IU] via SUBCUTANEOUS
  Filled 2015-06-24 (×21): qty 1

## 2015-06-24 MED ORDER — ADULT MULTIVITAMIN W/MINERALS CH
1.0000 | ORAL_TABLET | Freq: Every day | ORAL | Status: DC
  Administered 2015-06-25 – 2015-07-02 (×8): 1 via ORAL
  Filled 2015-06-24 (×8): qty 1

## 2015-06-24 MED ORDER — LACTULOSE 10 GM/15ML PO SOLN
20.0000 g | Freq: Three times a day (TID) | ORAL | Status: DC
  Administered 2015-06-24: 20 g via ORAL
  Filled 2015-06-24 (×2): qty 30

## 2015-06-24 MED ORDER — ACETAMINOPHEN 650 MG RE SUPP: 650.0000 mg | Freq: Four times a day (QID) | RECTAL | Status: DC | PRN

## 2015-06-24 MED ORDER — ALPRAZOLAM 0.5 MG PO TABS
1.0000 mg | ORAL_TABLET | Freq: Three times a day (TID) | ORAL | Status: DC | PRN
  Administered 2015-06-24: 1 mg via ORAL
  Filled 2015-06-24: qty 2

## 2015-06-24 MED ORDER — SODIUM CHLORIDE 0.9 % IV BOLUS (SEPSIS)
500.0000 mL | Freq: Once | INTRAVENOUS | Status: AC
  Administered 2015-06-24: 500 mL via INTRAVENOUS

## 2015-06-24 MED ORDER — SODIUM CHLORIDE 0.9% FLUSH
3.0000 mL | Freq: Two times a day (BID) | INTRAVENOUS | Status: DC
  Administered 2015-06-25 – 2015-06-28 (×5): 3 mL via INTRAVENOUS
  Administered 2015-06-29: 13:00:00 via INTRAVENOUS
  Administered 2015-06-30 – 2015-07-01 (×2): 3 mL via INTRAVENOUS
  Administered 2015-07-01: 20 mL via INTRAVENOUS
  Administered 2015-07-02: 30 mL via INTRAVENOUS

## 2015-06-24 MED ORDER — SODIUM CHLORIDE 0.9 % IV SOLN: 250.0000 mL | INTRAVENOUS | Status: DC | PRN

## 2015-06-24 MED ORDER — GABAPENTIN 100 MG PO CAPS
100.0000 mg | ORAL_CAPSULE | Freq: Three times a day (TID) | ORAL | Status: DC
  Administered 2015-06-24: 100 mg via ORAL
  Filled 2015-06-24 (×2): qty 1

## 2015-06-24 MED ORDER — SODIUM CHLORIDE 0.9% FLUSH
3.0000 mL | Freq: Two times a day (BID) | INTRAVENOUS | Status: DC
  Administered 2015-06-25: 3 mL via INTRAVENOUS

## 2015-06-24 MED ORDER — MESALAMINE 1000 MG RE SUPP
1000.0000 mg | Freq: Every day | RECTAL | Status: DC
  Administered 2015-06-25 – 2015-06-27 (×3): 1000 mg via RECTAL
  Filled 2015-06-24 (×9): qty 1

## 2015-06-24 MED ORDER — NADOLOL 20 MG PO TABS
10.0000 mg | ORAL_TABLET | Freq: Every day | ORAL | Status: DC
  Administered 2015-06-25 – 2015-06-30 (×6): 10 mg via ORAL
  Filled 2015-06-24 (×7): qty 1

## 2015-06-24 MED ORDER — POTASSIUM CHLORIDE CRYS ER 20 MEQ PO TBCR
40.0000 meq | EXTENDED_RELEASE_TABLET | Freq: Once | ORAL | Status: AC
  Administered 2015-06-24: 40 meq via ORAL
  Filled 2015-06-24: qty 2

## 2015-06-24 MED ORDER — ACETAMINOPHEN 325 MG PO TABS: 650.0000 mg | ORAL_TABLET | Freq: Four times a day (QID) | ORAL | Status: DC | PRN

## 2015-06-24 NOTE — ED Provider Notes (Addendum)
CSN: [id number]     Arrival date & time 06/24/15  1456 History   First MD Initiated Contact with Patient 06/24/15 1618     Chief Complaint  Patient presents with  . Abnormal Lab     (Consider location/radiation/quality/duration/timing/severity/associated sxs/prior Treatment) The history is provided by the patient.  Patient w hx hep c, cirrhosis, crohns, c/o generalized weakness, mild confusion for the past few weeks, recently had lab work and told ammonia level was too high.  Patient has ostomy, and is having her normal 4-5 stools per day/normal for her.  Family notes patient has missed recent doses of lactulose - pt denies any other recent medication change. No falls. No faintness/syncope. No fever or chills. No vomiting. No gu c/o. Denies chest pain or sob.        Past Medical History  Diagnosis Date  . Hypertension   . Enteritis presumed infectious   . Crohn's   . Anemia   . Hepatitis C antibody test positive   . Hepatitis   . Renal insufficiency   . Cirrhosis (HCC)   . Splenomegaly   . Bilateral leg edema 01/2014  . Chronic back pain   . Thrombocytopenia [facility])    Past Surgical History  Procedure Laterality Date  . Colon surgery      MULTIPLE SURGERIES FOR CROHNS  . Ileostomy      multiple abdominal surgeries for Crohn's by Dr. Gabriel Cirri  . Cholecystectomy    . Abscess drainage      abdominal  . Esophagogastroduodenoscopy  10/05/2010    Procedure: ESOPHAGOGASTRODUODENOSCOPY (EGD);  Surgeon: [name], MD;  [facility];  Service: Endoscopy;  Laterality: N/A;  7:30 am  . Cirrhosis    . Esophagogastroduodenoscopy N/A 09/11/2013    Procedure: ESOPHAGOGASTRODUODENOSCOPY (EGD);  Surgeon: [name], MD;  [facility];  Service: Endoscopy;  Laterality: N/A;  . Ileoscopy  09/11/2013    Procedure: ILEOSCOPY THROUGH STOMA;  Surgeon: [name], MD;  [facility];  Service: Endoscopy;;  . Givens capsule study N/A 09/12/2013     Procedure: GIVENS CAPSULE STUDY;  Surgeon: [name], MD;  [facility];  Service: Endoscopy;  Laterality: N/A;  . Givens capsule study N/A 11/25/2013    Procedure: GIVENS CAPSULE STUDY;  Surgeon: [name], MD;  [facility];  Service: Endoscopy;  Laterality: N/A;  . Varices cauterization Right     in the stoma of her ileostomy  . Tips procedure  11/2013    [facility]   . Abdominal surgery     Family History  Problem Relation Age of Onset  . Cancer Mother   . Stroke Father    Social History  Substance Use Topics  . Smoking status: Never Smoker   . Smokeless tobacco: Never Used  . Alcohol Use: No   OB History    Gravida Para Term Preterm AB TAB SAB Ectopic Multiple Living            1     Review of Systems  Constitutional: Negative for fever and chills.  HENT: Negative for sore throat.   Eyes: Negative for visual disturbance.  Respiratory: Negative for shortness of breath.   Cardiovascular: Negative for chest pain.  Gastrointestinal: Negative for vomiting and abdominal pain.  Genitourinary: Negative for flank pain.  Musculoskeletal: Negative for back pain and neck pain.  Skin: Negative for rash.  Neurological: Negative for headaches.  Hematological: Does not bruise/bleed easily.  Psychiatric/Behavioral: Negative for confusion.      Allergies  Penicillins  Home Medications   Prior to Admission medications   Medication Sig Start Date End Date Taking? Authorizing Provider  ALPRAZolam Prudy Feeler) 1 MG tablet TAKE 1 TABLET BY MOUTH AT BEDTIME AS NEEDED 05/05/15   Junie Spencer, FNP  Cholecalciferol (VITAMIN D-1000 MAX ST) 1000 UNITS tablet Take 1,000 Units by mouth daily.    Historical Provider, MD  feeding supplement, ENSURE ENLIVE, (ENSURE ENLIVE) LIQD Take 237 mLs by mouth daily. 03/27/15   Standley Brooking, MD  furosemide (LASIX) 20 MG tablet Take 1 tablet (20 mg total) by mouth daily. Patient not taking: Reported on 06/23/2015 04/22/15    Erick Blinks, MD  gabapentin (NEURONTIN) 100 MG capsule Take 1 capsule (100 mg total) by mouth 3 (three) times daily. 04/23/15   Ernestina Penna, MD  lactulose (CHRONULAC) 10 GM/15ML solution Take 30 mLs (20 g total) by mouth 2 (two) times daily. 03/27/15   Standley Brooking, MD  mesalamine (CANASA) 1000 MG suppository Place 1 suppository (1,000 mg total) rectally at bedtime. 04/22/15   Erick Blinks, MD  Multiple Vitamins-Minerals (MULTIVITAMIN WITH MINERALS) tablet Take 1 tablet by mouth daily.    Historical Provider, MD  nadolol (CORGARD) 20 MG tablet Take 0.5 tablets (10 mg total) by mouth daily. 05/19/15   Junie Spencer, FNP  pantoprazole (PROTONIX) 40 MG tablet TAKE 1 TABLET BY MOUTH DAILY 06/09/14   Len Blalock, NP  potassium chloride (KLOR-CON) 20 MEQ packet Take 20 mEq by mouth daily. Patient not taking: Reported on 06/19/2015 06/02/15   Junie Spencer, FNP  potassium chloride 20 MEQ TBCR Take 40 mEq by mouth 2 (two) times daily. For 3 days. Afterwards, take your home dose of 20 meq daily. 06/14/15   Henderson Cloud, MD  Probiotic Product (PROBIOTIC PO) Take 1 tablet by mouth daily.    Historical Provider, MD  spironolactone (ALDACTONE) 25 MG tablet Take 25 mg by mouth once.  05/31/15   Historical Provider, MD   BP 96/38 mmHg  Pulse 65  Temp(Src) 98.2 F (36.8 C) (Oral)  Resp 18  Ht  (1.651 m)  Wt 92.987 kg  BMI 34.11 kg/m2  SpO2 100% Physical Exam  Constitutional: She is oriented to person, place, and time. She appears well-developed and well-nourished. No distress.  HENT:  Mouth/Throat: Oropharynx is clear and moist.  Eyes: Conjunctivae are normal. No scleral icterus.  Neck: Neck supple. No tracheal deviation present.  Cardiovascular: Normal rate, regular rhythm, normal heart sounds and intact distal pulses.   No murmur heard. Pulmonary/Chest: Effort normal and breath sounds normal. No respiratory distress.  Abdominal: Soft. Normal appearance and bowel sounds are  normal. She exhibits no distension. There is no tenderness.  Genitourinary:  No cva tenderness  Musculoskeletal: She exhibits edema.  Neurological: She is alert and oriented to person, place, and time.  Skin: Skin is warm and dry. No rash noted. She is not diaphoretic.  Psychiatric: She has a normal mood and affect.  Nursing note and vitals reviewed.   ED Course  Procedures (including critical care time) Labs Review   Results for orders placed or performed during the hospital encounter of 06/24/15  CBC  Result Value Ref Range   WBC 5.6 4.0 - 10.5 K/uL   RBC 2.83 (L) 3.87 - 5.11 MIL/uL   Hemoglobin 8.5 (L) 12.0 - 15.0 g/dL   HCT 16.1 (L) 09.6 - 04.5 %  MCV 89.0 78.0 - 100.0 fL   MCH 30.0 26.0 - 34.0 pg   MCHC 33.7 30.0 - 36.0 g/dL   RDW 16.1 (H) 09.6 - 04.5 %   Platelets 92 (L) 150 - 400 K/uL  Comprehensive metabolic panel  Result Value Ref Range   Sodium 129 (L) 135 - 145 mmol/L   Potassium 2.2 (LL) 3.5 - 5.1 mmol/L   Chloride 110 101 - 111 mmol/L   CO2 14 (L) 22 - 32 mmol/L   Glucose, Bld 122 (H) 65 - 99 mg/dL   BUN 43 (H) 6 - 20 mg/dL   Creatinine, Ser 4.09 (H) 0.44 - 1.00 mg/dL   Calcium 8.3 (L) 8.9 - 10.3 mg/dL   Total Protein 4.9 (L) 6.5 - 8.1 g/dL   Albumin 2.5 (L) 3.5 - 5.0 g/dL   AST 26 15 - 41 U/L   ALT 22 14 - 54 U/L   Alkaline Phosphatase 119 38 - 126 U/L   Total Bilirubin 1.0 0.3 - 1.2 mg/dL   GFR calc non Af Amer 25 (L) >60 mL/min   GFR calc Af Amer 29 (L) >60 mL/min   Anion gap 5 5 - 15  Protime-INR  Result Value Ref Range   Prothrombin Time 18.2 (H) 11.6 - 15.2 seconds   INR 1.50 (H) 0.00 - 1.49  Ammonia  Result Value Ref Range   Ammonia 138 (H) 9 - 35 umol/L  Magnesium  Result Value Ref Range   Magnesium 1.6 (L) 1.7 - 2.4 mg/dL   Dg Chest Port 1 View  06/10/2015  CLINICAL DATA:  Status post PICC Line placement. Weakness, dizziness. History of HTN, Renal insufficiency, crohn's, cirrhosis. EXAM: PORTABLE CHEST 1 VIEW COMPARISON:  06/08/2015  FINDINGS: Right-sided central line, likely from an internal jugular approach. This terminates at the low SVC. Midline trachea. Normal heart size. Atherosclerosis in the transverse aorta. No pleural effusion or pneumothorax. Clear lungs. IMPRESSION: Right internal jugular line with tip at low SVC.  No pneumothorax. Aortic atherosclerosis. Electronically Signed   By: Jeronimo Greaves M.D.   On: 06/10/2015 16:39   Dg Chest Port 1 View  06/08/2015  CLINICAL DATA:  Generalized weakness and nausea for 2 days. Initial encounter. EXAM: PORTABLE CHEST 1 VIEW COMPARISON:  PA and lateral chest 05/21/2015. Single view of the chest 04/16/2015. FINDINGS: The lungs are clear. Heart size is normal. No pneumothorax or pleural effusion. No focal bony abnormality. IMPRESSION: Negative chest. Electronically Signed   By: Drusilla Kanner M.D.   On: 06/08/2015 13:25      I have personally reviewed and evaluated these images and lab results as part of my medical decision-making.   EKG Interpretation  Date/Time:  Wednesday Jun 24 2015 18:27:02 EDT Ventricular Rate:  60 PR Interval:    QRS Duration: 83 QT Interval:  435 QTC Calculation: 435 R Axis:   54 Text Interpretation:  Sinus rhythm Low voltage QRS U waves present Baseline wander Confirmed by Denton Lank  MD, Caryn Bee ([id number]) on 06/24/2015 6:30:16 PM        MDM   Iv ns. Continuous pulse ox and monitor.   Labs.  Reviewed nursing notes and prior charts for additional history.   Magnesium low, Mg 2 gm iv.   K very low. Monitor. Ecg.  kcl iv and po.   Ammonia level very high, lactulose po.  Recheck patient abd soft nt. Chest cta. No cp or sob. No faintness or dizziness, no dysrhythmia on monitor.  Hospitalists consulted for admission.  CRITICAL CARE  RE cirrhosis w severe hyperammonemia, severe hypokalemia requiring iv therapy, hypomagnesemia, altered mental status/confusion, weakness.  Performed by: Suzi Roots Total critical care time: 35  minutes Critical care time was exclusive of separately billable procedures and treating other patients. Critical care was necessary to treat or prevent imminent or life-threatening deterioration. Critical care was time spent personally by me on the following activities: development of treatment plan with patient and/or surrogate as well as nursing, discussions with consultants, evaluation of patient's response to treatment, examination of patient, obtaining history from patient or surrogate, ordering and performing treatments and interventions, ordering and review of laboratory studies, ordering and review of radiographic studies, pulse oximetry and re-evaluation of patient's condition.   Hospitalist consulted for admission.  Discussed with DR Opyd - will admit to tele.       Cathren Laine, MD 06/24/15 [phone]

## 2015-06-24 NOTE — H&P (Signed)
History and Physical    VERDA MEHTA [name]:[id number] DOB: [birth year] DOA: 06/24/2015  PCP: Elige Radon Dettinger, MD   Patient coming from: Home  Chief Complaint: Confusion, generalized weakness  HPI: Kathryn [name] is a 60 y.o. female with medical history significant for successfully-treated hep C, liver cirrhosis, chronic kidney disease stage III, and Crohn's disease status-post multiple surgeries and ileostomy who presents to the ED with several days of generalized weakness, dizziness, and confusion. She was evaluated for these complaints yesterday by her gastroenterologist and some blood work at been ordered. Ammonia level was noted to be markedly elevated, and with worsening symptoms, she was directed to the emergency department for evaluation. Patient denies any recent fevers or chills. She denies abdominal pain or vomiting. She reports worsening in her chronic lower extremity edema. She denies chest pain or palpitations. She reports that she is continue to take her lactulose as prescribed and empties her ileostomy about 4 times daily. She has had multiple recent admissions under similar circumstances.  ED Course: Upon arrival to the ED, patient is found to be afebrile, saturating well on room air, blood pressure 100/52, vitals otherwise stable. EKG features a sinus rhythm with U-waves. CMP is notable for a sodium of 129, potassium 2.2, bicarbonate of 14, and serum creatinine of 2.06, up from 1.56 10 days ago. CBC features a hemoglobin of 8.5 and platelet count of 92,000. INR is elevated to 1.5. Urine was obtained and on analysis is unremarkable. A 500 mL normal saline bolus was administered in the emergency department and intravenous magnesium and potassium were supplemented. A 20 g dose of oral lactulose was administered. Patient remained hemodynamically stable in the emergency department and will be admitted to the hospital for ongoing evaluation and management of hepatic encephalopathy, acute on  chronic kidney injury, and hypokalemia.  Review of Systems:  All other systems reviewed and apart from HPI, are negative.  Past Medical History  Diagnosis Date  . Hypertension   . Enteritis presumed infectious   . Crohn's   . Anemia   . Hepatitis C antibody test positive   . Hepatitis   . Renal insufficiency   . Cirrhosis (HCC)   . Splenomegaly   . Bilateral leg edema 01/2014  . Chronic back pain   . Thrombocytopenia [facility])     Past Surgical History  Procedure Laterality Date  . Colon surgery      MULTIPLE SURGERIES FOR CROHNS  . Ileostomy      multiple abdominal surgeries for Crohn's by Dr. Gabriel Cirri  . Cholecystectomy    . Abscess drainage      abdominal  . Esophagogastroduodenoscopy  10/05/2010    Procedure: ESOPHAGOGASTRODUODENOSCOPY (EGD);  Surgeon: [name], MD;  [facility];  Service: Endoscopy;  Laterality: N/A;  7:30 am  . Cirrhosis    . Esophagogastroduodenoscopy N/A 09/11/2013    Procedure: ESOPHAGOGASTRODUODENOSCOPY (EGD);  Surgeon: [name], MD;  [facility];  Service: Endoscopy;  Laterality: N/A;  . Ileoscopy  09/11/2013    Procedure: ILEOSCOPY THROUGH STOMA;  Surgeon: [name], MD;  [facility];  Service: Endoscopy;;  . Givens capsule study N/A 09/12/2013    Procedure: GIVENS CAPSULE STUDY;  Surgeon: [name], MD;  [facility];  Service: Endoscopy;  Laterality: N/A;  . Givens capsule study N/A 11/25/2013    Procedure: GIVENS CAPSULE STUDY;  Surgeon: [name], MD;  [facility];  Service: Endoscopy;  Laterality: N/A;  . Varices cauterization Right     in the stoma of her ileostomy  . Tips procedure  11/2013    [facility]   . Abdominal surgery       reports that she has never smoked. She has never used smokeless tobacco. She reports that she does not drink alcohol or use illicit drugs.  Allergies  Allergen Reactions  . Penicillins Rash    Has patient had a PCN reaction  causing immediate rash, facial/tongue/throat swelling, SOB or lightheadedness with hypotension: Yes Has patient had a PCN reaction causing severe rash involving mucus membranes or skin necrosis: No Has patient had a PCN reaction that required hospitalization No Has patient had a PCN reaction occurring within the last 10 years: No If all of the above answers are "NO", then may proceed with Cephalosporin use.     Family History  Problem Relation Age of Onset  . Cancer Mother   . Stroke Father      Prior to Admission medications   Medication Sig Start Date End Date Taking? Authorizing Provider  ALPRAZolam (XANAX) 1 MG tablet TAKE 1 TABLET BY MOUTH AT BEDTIME AS NEEDED Patient taking differently: TAKE 1 TABLET BY MOUTH AT BEDTIME AS NEEDED ANXIETY 05/05/15  Yes Junie Spencer, FNP  Cholecalciferol (VITAMIN D-1000 MAX ST) 1000 UNITS tablet Take 1,000 Units by mouth daily.   Yes Historical Provider, MD  feeding supplement, ENSURE ENLIVE, (ENSURE ENLIVE) LIQD Take 237 mLs by mouth daily. 03/27/15  Yes Standley Brooking, MD  furosemide (LASIX) 20 MG tablet Take 1 tablet (20 mg total) by mouth daily. 04/22/15  Yes Erick Blinks, MD  gabapentin (NEURONTIN) 100 MG capsule Take 1 capsule (100 mg total) by mouth 3 (three) times daily. 04/23/15  Yes Ernestina Penna, MD  lactulose (CHRONULAC) 10 GM/15ML solution Take 30 mLs (20 g total) by mouth 2 (two) times daily. Patient taking differently: Take 20 g by mouth 3 (three) times daily.  03/27/15  Yes Standley Brooking, MD  mesalamine (CANASA) 1000 MG suppository Place 1 suppository (1,000 mg total) rectally at bedtime. 04/22/15  Yes Erick Blinks, MD  Multiple Vitamins-Minerals (MULTIVITAMIN WITH MINERALS) tablet Take 1 tablet by mouth daily.   Yes Historical Provider, MD  nadolol (CORGARD) 20 MG tablet Take 0.5 tablets (10 mg total) by mouth daily. 05/19/15  Yes Junie Spencer, FNP  pantoprazole (PROTONIX) 40 MG tablet TAKE 1 TABLET BY MOUTH DAILY 06/09/14   Yes Len Blalock, NP  potassium chloride (KLOR-CON) 20 MEQ packet Take 20 mEq by mouth daily. 06/02/15  Yes Junie Spencer, FNP  Probiotic Product (PROBIOTIC PO) Take 1 tablet by mouth daily.   Yes Historical Provider, MD  spironolactone (ALDACTONE) 25 MG tablet Take 25 mg by mouth daily.  05/31/15  Yes Historical Provider, MD  potassium chloride 20 MEQ TBCR Take 40 mEq by mouth 2 (two) times daily. For 3 days. Afterwards, take your home dose of 20 meq daily. Patient not taking: Reported on 06/24/2015 06/14/15   Henderson Cloud, MD    Physical Exam: Filed Vitals:   06/24/15 1700 06/24/15 1709 06/24/15 1730 06/24/15 1830  BP: 117/87 117/87 100/52 97/60  Pulse: 61 60 59 62  Temp:      TempSrc:      Resp:  16  18  Height:      Weight:      SpO2: 100% 99% 99% 100%      Constitutional: NAD, calm, comfortable  Eyes: PERTLA, lids and conjunctivae normal, anicteric sclera ENMT: Mucous membranes are moist. Posterior pharynx clear of any exudate or lesions.   Neck: normal, supple, no masses, no thyromegaly Respiratory: clear to auscultation bilaterally, no wheezing, no crackles. Normal respiratory effort.   Cardiovascular: S1 & S2 heard, regular rate and rhythm, no significant murmurs / rubs / gallops. Pitting edema b/l LEs to thighs.   Abdomen: No distension, mild tenderness throughout, no masses palpated. Bowel sounds normal.  Musculoskeletal: no clubbing / cyanosis. No joint deformity upper and lower extremities.    Skin: Angiomata and ecchymoses scattered throughout. Warm, dry, well-perfused. Neurologic: CN 2-12 grossly intact. Sensation intact, DTR normal. Strength 5/5 in all 4 limbs. Tremulous UEs at rest.   Psychiatric: Normal judgment and insight. Somnolent but arousable, oriented x 3. Normal mood and affect.     Labs on Admission: I have personally reviewed following labs and imaging studies  CBC:  Recent Labs Lab 06/23/15 1711 06/24/15 1623  WBC 4.5 5.6  NEUTROABS  2520  --   HGB 8.5* 8.5*  HCT 26.8* 25.2*  MCV 93.7 89.0  PLT 97* 92*   Basic Metabolic Panel:  Recent Labs Lab 06/23/15 1117 06/24/15 1623  NA 133* 129*  K 2.5* 2.2*  CL 103 110  CO2 13* 14*  GLUCOSE 127* 122*  BUN 38* 43*  CREATININE 2.11* 2.06*  CALCIUM 8.9 8.3*  MG  --  1.6*   GFR: Estimated Creatinine Clearance: 33.1 mL/min (by C-G formula based on Cr of 2.06). Liver Function Tests:  Recent Labs Lab 06/23/15 1117 06/23/15 1708 06/24/15 1623  AST 20 21 26   ALT 19 16 22   ALKPHOS 157* 127 119  BILITOT 0.9 1.2 1.0  PROT 4.7* 4.6* 4.9*  ALBUMIN 2.7* 2.4* 2.5*   No results for input(s): LIPASE, AMYLASE in the last 168 hours.  Recent Labs Lab 06/23/15 1117 06/24/15 1626  AMMONIA 170* 138*   Coagulation Profile:  Recent Labs Lab 06/23/15 1117 06/24/15 1623  INR 1.1 1.50*   Cardiac Enzymes: No results for input(s): CKTOTAL, CKMB, CKMBINDEX, TROPONINI in the last 168 hours. BNP (last 3 results) No results for input(s): PROBNP in the last 8760 hours. HbA1C: No results for input(s): HGBA1C in the last 72 hours. CBG: No results for input(s): GLUCAP in the last 168 hours. Lipid Profile: No results for input(s): CHOL, HDL, LDLCALC, TRIG, CHOLHDL, LDLDIRECT in the last 72 hours. Thyroid Function Tests: No results for input(s): TSH, T4TOTAL, FREET4, T3FREE, THYROIDAB in the last 72 hours. Anemia Panel: No results for input(s): VITAMINB12, FOLATE, FERRITIN, TIBC, IRON, RETICCTPCT in the last 72 hours. Urine analysis:    Component Value Date/Time   COLORURINE YELLOW 06/24/2015 1740   APPEARANCEUR CLEAR 06/24/2015 1740   APPEARANCEUR Hazy* 06/01/2015 1450   LABSPEC 1.010 06/24/2015 1740   PHURINE 5.5 06/24/2015 1740   GLUCOSEU NEGATIVE 06/24/2015 1740   HGBUR NEGATIVE 06/24/2015 1740   BILIRUBINUR NEGATIVE 06/24/2015 1740   BILIRUBINUR Negative 06/01/2015 1450   BILIRUBINUR negative 02/23/2015 1751   KETONESUR NEGATIVE 06/24/2015 1740   PROTEINUR  NEGATIVE 06/24/2015 1740   PROTEINUR Trace 06/01/2015 1450   PROTEINUR 2+ 02/23/2015 1751   UROBILINOGEN negative 02/23/2015 1751   UROBILINOGEN 0.2 06/09/2014 0545   NITRITE NEGATIVE 06/24/2015 1740   NITRITE Negative 06/01/2015 1450   NITRITE negative 02/23/2015 1751   LEUKOCYTESUR SMALL* 06/24/2015 1740   LEUKOCYTESUR 3+* 06/01/2015 1450   Sepsis Labs: @LABRCNTIP (procalcitonin:4,lacticidven:4) )No results found for this or any previous visit (from the  past 240 hour(s)).   Radiological Exams on Admission: No results found.  EKG: Independently reviewed. Sinus rhythm, low-voltage QRS, U-wave present  Assessment/Plan  1. Cirrhosis with hepatic encephalopathy  - Cirrhosis attributed to hep C (treated successfully)  - MELD is 24 on admission, corresponding to estimated 52-month mortality of 6% - Ammonia is 138 on admission; continue lactulose 20 g TID, titrate to achieve effect  - No ascites on exam; pt denies hx of such  - No EGD on file; unknown if has varices, but on nadolol, will continue  - Resume Aldactone and Lasix and renal function tolerates    2. AKI on CKD stage III  - SCr 2.06 on admission, up from 1.56 on 06/14/15  - Uncertain etiology; BUN:SCr ratio suggests possible prerenal azotemia  - Holding diuretics for now - Avoid nephrotoxins as feasible  - Repeat chem panel in am    3. Hypokalemia, hyponatremia - Serum sodium 129 and potassium 2.2 on arrival  - Hyponatremia likely secondary to cirrhosis, imparting a poor prognosis  - Hypokalemia possibly secondary to GI losses given loose stools per ileostomy  - Replacing potassium IV; mag also low, replacing IV  - Repeat chem panel and mag level in am and replete prn    4. Anemia, thrombocytopenia  - Hgb 8.5 and platelets 92,000 on admission  - No sign of active blood-loss  - Hgb lower than recent priors, unclear etiology   - Platelet count appears stable relative to priors, secondary to liver disease    5. Crohn's  disease - s/p multiple surgeries and ileostomy  - Stable  - Continue mesalamine    6. Coagulopathy  - INR is 1.50 on admission  - Likely secondary to cirrhosis; no active bleeding identified    DVT prophylaxis: sq heparin  Code Status: Full   Family Communication: Discussed with patient  Disposition Plan: Observe on telemetry   Consults called: None  Admission status: Observation    Briscoe Deutscher, MD [facility] Pager [phone]  If 7PM-7AM, please contact night-coverage [email or web address] Password [facility]  06/24/2015, 8:31 PM

## 2015-06-24 NOTE — Telephone Encounter (Signed)
[employer] case manager feels like Mrs. Man needs a Designer, industrial/product and nurse aide.

## 2015-06-24 NOTE — Telephone Encounter (Signed)
Patient called, wants to know the kidney doctor you wanted her to see.  [phone]

## 2015-06-24 NOTE — Telephone Encounter (Signed)
I have spoken with patient 

## 2015-06-24 NOTE — ED Notes (Signed)
Pt had lab work yesterday and was told today that her ammonia level was elevated. Pt c/o dizziness.

## 2015-06-24 NOTE — Telephone Encounter (Signed)
Lab is noted for 1 week.

## 2015-06-24 NOTE — ED Notes (Signed)
CRITICAL VALUE ALERT  Critical value received:  K 2.2  Date of notification:  06/24/2015  Time of notification:  1800  Critical value read bac  Nurse who received alert:  Gaetano Hawthorne  MD notified (1st page):  Dr. Denton Lank notified at 1800  Time of first page:    MD notified (2nd page):  Time of second page:  Responding MD:   Time MD responded:

## 2015-06-25 DIAGNOSIS — K7290 Hepatic failure, unspecified without coma: Secondary | ICD-10-CM | POA: Diagnosis not present

## 2015-06-25 DIAGNOSIS — R410 Disorientation, unspecified: Secondary | ICD-10-CM | POA: Insufficient documentation

## 2015-06-25 DIAGNOSIS — D649 Anemia, unspecified: Secondary | ICD-10-CM | POA: Insufficient documentation

## 2015-06-25 LAB — COMPREHENSIVE METABOLIC PANEL
ALT: 20 U/L (ref 14–54)
AST: 24 U/L (ref 15–41)
Albumin: 2.3 g/dL — ABNORMAL LOW (ref 3.5–5.0)
Alkaline Phosphatase: 107 U/L (ref 38–126)
Anion gap: 4 — ABNORMAL LOW (ref 5–15)
BILIRUBIN TOTAL: 1 mg/dL (ref 0.3–1.2)
BUN: 40 mg/dL — AB (ref 6–20)
CALCIUM: 8.5 mg/dL — AB (ref 8.9–10.3)
CO2: 15 mmol/L — ABNORMAL LOW (ref 22–32)
CREATININE: 1.78 mg/dL — AB (ref 0.44–1.00)
Chloride: 113 mmol/L — ABNORMAL HIGH (ref 101–111)
GFR, EST AFRICAN AMERICAN: 35 mL/min — AB (ref >60)
GFR, EST NON AFRICAN AMERICAN: 30 mL/min — AB (ref >60)
Glucose, Bld: 168 mg/dL — ABNORMAL HIGH (ref 65–99)
Potassium: 2.3 mmol/L — CL (ref 3.5–5.1)
Sodium: 132 mmol/L — ABNORMAL LOW (ref 135–145)
TOTAL PROTEIN: 4.6 g/dL — AB (ref 6.5–8.1)

## 2015-06-25 LAB — GLUCOSE, CAPILLARY: GLUCOSE-CAPILLARY: 141 mg/dL — AB (ref 65–99)

## 2015-06-25 LAB — POTASSIUM

## 2015-06-25 LAB — CBC WITH DIFFERENTIAL/PLATELET
BASOS ABS: 0 10*3/uL (ref 0.0–0.1)
BASOS PCT: 1 %
EOS ABS: 0.1 10*3/uL (ref 0.0–0.7)
EOS PCT: 2 %
HCT: 24.9 % — ABNORMAL LOW (ref 36.0–46.0)
HEMOGLOBIN: 8.3 g/dL — AB (ref 12.0–15.0)
Lymphocytes Relative: 38 %
Lymphs Abs: 1.7 10*3/uL (ref 0.7–4.0)
MCH: 29.7 pg (ref 26.0–34.0)
MCHC: 33.3 g/dL (ref 30.0–36.0)
MCV: 89.2 fL (ref 78.0–100.0)
Monocytes Absolute: 0.4 10*3/uL (ref 0.1–1.0)
Monocytes Relative: 8 %
NEUTROS PCT: 50 %
Neutro Abs: 2.3 10*3/uL (ref 1.7–7.7)
PLATELETS: 93 10*3/uL — AB (ref 150–400)
RBC: 2.79 MIL/uL — AB (ref 3.87–5.11)
RDW: 18.2 % — ABNORMAL HIGH (ref 11.5–15.5)
WBC: 4.6 10*3/uL (ref 4.0–10.5)

## 2015-06-25 LAB — BASIC METABOLIC PANEL
ANION GAP: 2 — AB (ref 5–15)
BUN: 37 mg/dL — AB (ref 6–20)
CALCIUM: 8.2 mg/dL — AB (ref 8.9–10.3)
CO2: 14 mmol/L — AB (ref 22–32)
Chloride: 115 mmol/L — ABNORMAL HIGH (ref 101–111)
Creatinine, Ser: 1.87 mg/dL — ABNORMAL HIGH (ref 0.44–1.00)
GFR calc Af Amer: 33 mL/min — ABNORMAL LOW (ref >60)
GFR calc non Af Amer: 28 mL/min — ABNORMAL LOW (ref >60)
GLUCOSE: 150 mg/dL — AB (ref 65–99)
POTASSIUM: 3.2 mmol/L — AB (ref 3.5–5.1)
Sodium: 131 mmol/L — ABNORMAL LOW (ref 135–145)

## 2015-06-25 LAB — PROTIME-INR
INR: 1.65 — ABNORMAL HIGH (ref 0.00–1.49)
PROTHROMBIN TIME: 19.5 s — AB (ref 11.6–15.2)

## 2015-06-25 LAB — MAGNESIUM
MAGNESIUM: 2.1 mg/dL (ref 1.7–2.4)
Magnesium: 2 mg/dL (ref 1.7–2.4)

## 2015-06-25 LAB — MRSA PCR SCREENING: MRSA by PCR: NEGATIVE

## 2015-06-25 LAB — AMMONIA: AMMONIA: 132 umol/L — AB (ref 9–35)

## 2015-06-25 MED ORDER — PRO-STAT SUGAR FREE PO LIQD
30.0000 mL | Freq: Three times a day (TID) | ORAL | Status: DC
  Administered 2015-06-25 – 2015-07-02 (×19): 30 mL via ORAL
  Filled 2015-06-25 (×22): qty 30

## 2015-06-25 MED ORDER — HYDROCODONE-ACETAMINOPHEN 5-325 MG PO TABS
1.0000 | ORAL_TABLET | Freq: Four times a day (QID) | ORAL | Status: DC | PRN
  Administered 2015-06-25 – 2015-07-01 (×4): 1 via ORAL
  Filled 2015-06-25 (×4): qty 1

## 2015-06-25 MED ORDER — POTASSIUM CHLORIDE CRYS ER 20 MEQ PO TBCR
40.0000 meq | EXTENDED_RELEASE_TABLET | Freq: Once | ORAL | Status: AC
  Administered 2015-06-25: 40 meq via ORAL
  Filled 2015-06-25: qty 2

## 2015-06-25 MED ORDER — MAGNESIUM SULFATE IN D5W 1-5 GM/100ML-% IV SOLN
1.0000 g | Freq: Once | INTRAVENOUS | Status: AC
  Administered 2015-06-25: 1 g via INTRAVENOUS
  Filled 2015-06-25: qty 100

## 2015-06-25 MED ORDER — RIFAXIMIN 550 MG PO TABS
550.0000 mg | ORAL_TABLET | Freq: Two times a day (BID) | ORAL | Status: DC
  Administered 2015-06-25 – 2015-07-02 (×15): 550 mg via ORAL
  Filled 2015-06-25 (×15): qty 1

## 2015-06-25 MED ORDER — LACTULOSE 10 GM/15ML PO SOLN
30.0000 g | Freq: Three times a day (TID) | ORAL | Status: DC
  Administered 2015-06-25 – 2015-07-02 (×17): 30 g via ORAL
  Filled 2015-06-25 (×20): qty 60

## 2015-06-25 MED ORDER — POTASSIUM CHLORIDE CRYS ER 20 MEQ PO TBCR
40.0000 meq | EXTENDED_RELEASE_TABLET | Freq: Four times a day (QID) | ORAL | Status: AC
  Administered 2015-06-25: 40 meq via ORAL
  Filled 2015-06-25: qty 2

## 2015-06-25 MED ORDER — SODIUM CHLORIDE 0.9 % IV SOLN: 250.0000 mL | INTRAVENOUS | Status: DC | PRN

## 2015-06-25 MED ORDER — ALPRAZOLAM 0.5 MG PO TABS
0.5000 mg | ORAL_TABLET | Freq: Two times a day (BID) | ORAL | Status: DC
  Administered 2015-06-25 – 2015-07-02 (×14): 0.5 mg via ORAL
  Filled 2015-06-25 (×14): qty 1

## 2015-06-25 MED ORDER — POTASSIUM CHLORIDE 10 MEQ/100ML IV SOLN
10.0000 meq | INTRAVENOUS | Status: AC
  Administered 2015-06-25 (×3): 10 meq via INTRAVENOUS
  Filled 2015-06-25 (×2): qty 100

## 2015-06-25 MED ORDER — MIDODRINE HCL 5 MG PO TABS
5.0000 mg | ORAL_TABLET | Freq: Three times a day (TID) | ORAL | Status: DC
  Administered 2015-06-25 – 2015-07-02 (×21): 5 mg via ORAL
  Filled 2015-06-25 (×22): qty 1

## 2015-06-25 MED ORDER — POTASSIUM CHLORIDE CRYS ER 20 MEQ PO TBCR
40.0000 meq | EXTENDED_RELEASE_TABLET | Freq: Four times a day (QID) | ORAL | Status: DC
  Administered 2015-06-25: 40 meq via ORAL
  Filled 2015-06-25: qty 2

## 2015-06-25 NOTE — Progress Notes (Signed)
1922 K+ 3.2, Dr.Singh notified and the following new order given: 1.) Potassium Cl PO x 1 time only

## 2015-06-25 NOTE — Progress Notes (Addendum)
PROGRESS NOTE                                                                                                                                                                                                             Patient Demographics:    Kathryn [name], is a 60 y.o. female, DOB - 12-31-1955, ZOX:[id number]  Admit date - 06/24/2015   Admitting Physician Briscoe Deutscher, MD  Outpatient Primary MD for the patient is Nils Pyle, MD  LOS - 1    Chief Complaint  Patient presents with  . Abnormal Lab       Brief Narrative     Kathryn [name] is a 60 y.o. female with medical history significant for successfully-treated hep C, liver cirrhosis, chronic kidney disease stage III, and Crohn's disease status-post multiple surgeries and ileostomy who presents to the ED with several days of generalized weakness, dizziness, and confusion. She was found to Hep encephalopathy, ARF, Severe Hypokalemia, GI and Renal have been called.    Subjective:    [name] today has, No headache, No chest pain, No abdominal pain - No Nausea, No new weakness tingling or numbness, No Cough - SOB.    Assessment  & Plan :      1.Hepatic encephalopathy. In a patient with hep C cirrhosis. Hep C was treated. Have placed her on lactulose 30 g 3 times a day along with Xifaxan, belly is soft, she is afebrile without leukocytosis. Mentation has improved. We'll consult her primary GI physician as well. I  2. Hep C cirrhosis. Hep C was treated. Supportive care, continue beta blocker, diuretics and hold as blood pressure is soft. Clinically no SBP. She has cirrhosis related mild thrombocytopenia, coagulopathy and hypoalbuminemia.  3. Hypokalemia. Replace oral and IV, she has currently lost IV ostomy, PICC line requested.  4. History of Crohn's disease. Status post ileostomy, supportive care for now GI consulted. Continue mesalamine.  5. Anxiety.  On benzodiazepine dose reduced due to encephalopathy.  6. Generalized weakness. Due to #1 above. Increase activity. PT evaluation.  7. ARF on CK D stage III with evidence of both hypotension and third spacing with edema. Albumin is quite low likely cirrhosis related, placed on protein supplements along with midodrine, currently gentle hydration, hold IV fluids, renal consulted. No proteinuria.  8. Anemia of chronic disease.  Monitor.  9. Hypotension. Due to intravascular dehydration and third spacing of fluid, gentle hydration and monitor. Placed on protein supplementation and midodrine. She is not septic.    Code Status :  Full  Family Communication  : None  Disposition Plan  :  Stay Inpt  Consults  :  Renal  Procedures  :    PICC requested  DVT Prophylaxis  :    Heparin   Lab Results  Component Value Date   PLT 93* 06/25/2015    Inpatient Medications  Scheduled Meds: . ALPRAZolam  0.5 mg Oral BID  . cholecalciferol  1,000 Units Oral Daily  . feeding supplement (ENSURE ENLIVE)  237 mL Oral Q24H  . heparin  5,000 Units Subcutaneous Q8H  . lactulose  20 g Oral TID  . magnesium sulfate 1 - 4 g bolus IVPB  1 g Intravenous Once  . mesalamine  1,000 mg Rectal QHS  . multivitamin with minerals  1 tablet Oral Daily  . nadolol  10 mg Oral Daily  . pantoprazole  40 mg Oral Daily  . potassium chloride  10 mEq Intravenous Q1 Hr x 6  . potassium chloride  40 mEq Oral Q6H  . sodium chloride flush  3 mL Intravenous Q12H   Continuous Infusions:  PRN Meds:.sodium chloride, acetaminophen **OR** [DISCONTINUED] acetaminophen, HYDROcodone-acetaminophen, [DISCONTINUED] ondansetron **OR** ondansetron (ZOFRAN) IV  Antibiotics  :    Anti-infectives    None         Objective:   Filed Vitals:   06/24/15 1830 06/24/15 2030 06/24/15 2351 06/25/15 0618  BP: 97/60 84/38 103/55 96/42  Pulse: 62 60 64 58  Temp:   97.7 F (36.5 C) 97.7 F (36.5 C)  TempSrc:   Oral Oral  Resp: 18 16  20 17   Height:      Weight:   92.9 kg (204 lb 12.9 oz) 92.262 kg (203 lb 6.4 oz)  SpO2: 100% 100% 100% 100%    Wt Readings from Last 3 Encounters:  06/25/15 92.262 kg (203 lb 6.4 oz)  06/23/15 92.987 kg (205 lb)  06/19/15 83.915 kg (185 lb)     Intake/Output Summary (Last 24 hours) at 06/25/15 1027 Last data filed at 06/25/15 0854  Gross per 24 hour  Intake    240 ml  Output      0 ml  Net    240 ml     Physical Exam  Awake, Mildly confused oriented 2, No new F.N deficits, Normal affect [place].AT,PERRAL Supple Neck,No JVD, No cervical lymphadenopathy appriciated.  Symmetrical Chest wall movement, Good air movement bilaterally, CTAB RRR,No Gallops,Rubs or new Murmurs, No Parasternal Heave +ve B.Sounds, Abd Soft, No tenderness, No organomegaly appriciated, No rebound - guarding or rigidity. Ileostomy site stable. No Cyanosis, Clubbing 2 + edema, No new Rash or bruise       Data Review:    CBC  Recent Labs Lab 06/23/15 1711 06/24/15 1623 06/25/15 0541  WBC 4.5 5.6 4.6  HGB 8.5* 8.5* 8.3*  HCT 26.8* 25.2* 24.9*  PLT 97* 92* 93*  MCV 93.7 89.0 89.2  MCH 29.7 30.0 29.7  MCHC 31.7* 33.7 33.3  RDW 17.6* 18.1* 18.2*  LYMPHSABS 1395  --  1.7  MONOABS 450  --  0.4  EOSABS 135  --  0.1  BASOSABS 0  --  0.0    Chemistries   Recent Labs Lab 06/23/15 1117 06/23/15 1708 06/24/15 1623 06/25/15 0541  NA 133*  --  129* 132*  K  2.5*  --  2.2* 2.3*  CL 103  --  110 113*  CO2 13*  --  14* 15*  GLUCOSE 127*  --  122* 168*  BUN 38*  --  43* 40*  CREATININE 2.11*  --  2.06* 1.78*  CALCIUM 8.9  --  8.3* 8.5*  MG  --   --  1.6* 2.1  AST 20 21 26 24   ALT 19 16 22 20   ALKPHOS 157* 127 119 107  BILITOT 0.9 1.2 1.0 1.0   ------------------------------------------------------------------------------------------------------------------ No results for input(s): CHOL, HDL, LDLCALC, TRIG, CHOLHDL, LDLDIRECT in the last 72 hours.  Lab Results  Component Value Date   HGBA1C  4.9% 02/13/2014   ------------------------------------------------------------------------------------------------------------------ No results for input(s): TSH, T4TOTAL, T3FREE, THYROIDAB in the last 72 hours.  Invalid input(s): FREET3 ------------------------------------------------------------------------------------------------------------------ No results for input(s): VITAMINB12, FOLATE, FERRITIN, TIBC, IRON, RETICCTPCT in the last 72 hours.  Coagulation profile  Recent Labs Lab 06/23/15 1117 06/24/15 1623  INR 1.1 1.50*    No results for input(s): DDIMER in the last 72 hours.  Cardiac Enzymes No results for input(s): CKMB, TROPONINI, MYOGLOBIN in the last 168 hours.  Invalid input(s): CK ------------------------------------------------------------------------------------------------------------------    Component Value Date/Time   BNP 74.0 03/26/2015 0345    Micro Results No results found for this or any previous visit (from the past 240 hour(s)).  Radiology Reports    Time Spent in minutes  30   Susa Raring K M.D on 06/25/2015 at 10:27 AM  Between 7am to 7pm - Pager - [phone]  After 7pm go to [email or web address] - password [facility]  [facility] -  Office  [phone]

## 2015-06-25 NOTE — Care Management Note (Signed)
Case Management Note  Patient Details  Name: Kathryn [name] MRN: [id number] Date of Birth: 1956/02/06  Subjective/Objective:                  Pt is from home, lives with her husband and is ind with ADL's. Pt uses a cane for mobility. Pt has PCP, transportation and no difficulty affording her medications. Pt has had multiple readmissions int he past 6 months and HH services have been suggested. Pt is agreeable and has chosen [facility] from list of agencies. Alroy Bailiff, of [facility], made aware of referral and will obtain pt info from chart.   Action/Plan: Will cont to follow for DC planning needs.   Expected Discharge Date:    06/26/2015              Expected Discharge Plan:  Home w Home Health Services  In-House Referral:  NA  Discharge planning Services  CM Consult  Post Acute Care Choice:  Home Health Choice offered to:  Patient  DME Arranged:    DME Agency:     HH Arranged:    HH Agency:  [facility]  Status of Service:  In process, will continue to follow  Medicare Important Message Given:    Date Medicare IM Given:    Medicare IM give by:    Date Additional Medicare IM Given:    Additional Medicare Important Message give by:     If discussed at Long Length of Stay Meetings, dates discussed:    Additional Comments:  Malcolm Metro, RN 06/25/2015, 2:31 PM

## 2015-06-25 NOTE — Progress Notes (Addendum)
Ginger at [facility] called to insert PICC line.  Pt. Accidentally pulled IV out while asleep.  Multiple failed attempts made to establish peripheral IV access.  Pt. Has critical lab values of K.

## 2015-06-25 NOTE — Progress Notes (Signed)
CRITICAL VALUE ALERT  Critical value received:  K = 2.3  Date of notification:  06/25/15  Time of notification:  0710  Critical value read back:Yes.    Nurse who received alert:  Antony Odea   MD notified (1st page):  Dr. Thedore Mins  Time of first page:  0728  MD notified (2nd page):  Time of second page:  Responding MD:    Time MD responded:

## 2015-06-25 NOTE — Care Management Obs Status (Signed)
MEDICARE OBSERVATION STATUS NOTIFICATION   Patient Details  Name: Kathryn [name] MRN: [id number] Date of Birth: 1955/02/10   Medicare Observation Status Notification Given:  Yes    Malcolm Metro, RN 06/25/2015, 2:32 PM

## 2015-06-26 ENCOUNTER — Ambulatory Visit: Payer: Medicare HMO | Admitting: Family Medicine

## 2015-06-26 DIAGNOSIS — D649 Anemia, unspecified: Secondary | ICD-10-CM

## 2015-06-26 DIAGNOSIS — E876 Hypokalemia: Secondary | ICD-10-CM

## 2015-06-26 DIAGNOSIS — K7290 Hepatic failure, unspecified without coma: Secondary | ICD-10-CM | POA: Diagnosis not present

## 2015-06-26 DIAGNOSIS — D696 Thrombocytopenia, unspecified: Secondary | ICD-10-CM | POA: Diagnosis not present

## 2015-06-26 DIAGNOSIS — K7469 Other cirrhosis of liver: Secondary | ICD-10-CM | POA: Diagnosis not present

## 2015-06-26 LAB — CBC
HCT: 24.6 % — ABNORMAL LOW (ref 36.0–46.0)
HEMOGLOBIN: 8.2 g/dL — AB (ref 12.0–15.0)
MCH: 29.8 pg (ref 26.0–34.0)
MCHC: 33.3 g/dL (ref 30.0–36.0)
MCV: 89.5 fL (ref 78.0–100.0)
PLATELETS: 90 10*3/uL — AB (ref 150–400)
RBC: 2.75 MIL/uL — ABNORMAL LOW (ref 3.87–5.11)
RDW: 18.7 % — ABNORMAL HIGH (ref 11.5–15.5)
WBC: 5.2 10*3/uL (ref 4.0–10.5)

## 2015-06-26 LAB — BASIC METABOLIC PANEL
BUN: 40 mg/dL — ABNORMAL HIGH (ref 6–20)
CALCIUM: 8.4 mg/dL — AB (ref 8.9–10.3)
CO2: 15 mmol/L — ABNORMAL LOW (ref 22–32)
CREATININE: 1.76 mg/dL — AB (ref 0.44–1.00)
Chloride: 115 mmol/L — ABNORMAL HIGH (ref 101–111)
GFR, EST AFRICAN AMERICAN: 35 mL/min — AB (ref >60)
GFR, EST NON AFRICAN AMERICAN: 31 mL/min — AB (ref >60)
Glucose, Bld: 131 mg/dL — ABNORMAL HIGH (ref 65–99)
Potassium: 3.7 mmol/L (ref 3.5–5.1)
SODIUM: 132 mmol/L — AB (ref 135–145)

## 2015-06-26 LAB — GLUCOSE, CAPILLARY: GLUCOSE-CAPILLARY: 157 mg/dL — AB (ref 65–99)

## 2015-06-26 MED ORDER — SPIRONOLACTONE 25 MG PO TABS
50.0000 mg | ORAL_TABLET | Freq: Every day | ORAL | Status: DC
  Administered 2015-06-26 – 2015-07-02 (×7): 50 mg via ORAL
  Filled 2015-06-26 (×7): qty 2

## 2015-06-26 MED ORDER — ALBUMIN HUMAN 25 % IV SOLN
INTRAVENOUS | Status: AC
  Filled 2015-06-26: qty 100

## 2015-06-26 MED ORDER — ALBUMIN HUMAN 25 % IV SOLN
25.0000 g | Freq: Every day | INTRAVENOUS | Status: AC
  Administered 2015-06-26 – 2015-06-29 (×4): 25 g via INTRAVENOUS
  Filled 2015-06-26 (×4): qty 100

## 2015-06-26 MED ORDER — TORSEMIDE 20 MG PO TABS
20.0000 mg | ORAL_TABLET | Freq: Every day | ORAL | Status: DC
  Administered 2015-06-26 – 2015-06-27 (×2): 20 mg via ORAL
  Filled 2015-06-26 (×2): qty 1

## 2015-06-26 NOTE — Consult Note (Signed)
Referring Provider: No ref. provider found Primary Care Physician:  Nils Pyle, MD Primary Gastroenterologist:  Dr. [name]  Reason for Consultation:    Hepatic encephalopathy.  HPI:   Patient is 60 year old Caucasian female who has Crohn's disease status post ileostomy several years ago, history of cirrhosis secondary to hepatitis C which has been successfully treated and due to NASH was seen in the office by [name] NP on 06/23/2015. She had lab studies. Serum ammonia was noted to be markedly elevated and so was her creatinine.. Patient was therefore advised to go to emergency room and she was subsequently hospitalized. History is provided by the patient and her husband [name] who is at bedside. Patient states she has not felt well for the last few months. She has had poor appetite and nausea. She also has been confused according to her husband and she is losing her memory. She has been taking lactulose as prescribed. She has to empty her ileostomy back at least 4 times a day. She has not experienced bleeding or melena into ileostomy bag. She denies abdominal distention or pain. She also denies hematuria or dysuria. She has rectum and left colon in situ. She has been using Canasa suppositories on when necessary basis for rectal disease she had ileostomy for refractory rectovaginal fistula several years ago Dr. Gabriel Cirri. It was meant to be temporary but her quality of life improved so much that she decided not to have ileostomy taken down. She states she has lost 20 pounds this year because of poor appetite. She denies fever chills or cough. She has not had any falling episodes but her husband has helped thorough number of occasions because she is been feeling very weak. He also complains of easy possibility but denies epistaxis. Patient has been evaluated by Dr. Fausto Skillern for acute on chronic renal failure. Her renal function is improving.  Patient underwent TIPS placement for recurrent  stomal GI bleed in October 2015.    Past Medical History  Diagnosis Date  . Hypertension       . Fistulas and Crohn's disease. Status post ileostomy possibly 20 years ago.    . Anemia    History of hepatitis C successfully treated.        . Renal insufficiency   . Cirrhosis (HCC)   . Splenomegaly   . Bilateral leg edema 01/2014  . Chronic back pain   . Thrombocytopenia [facility])     Past Surgical History  Procedure Laterality Date  . Colon surgery      MULTIPLE SURGERIES FOR CROHNS  . Ileostomy      multiple abdominal surgeries for Crohn's by Dr. Gabriel Cirri  . Cholecystectomy    . Abscess drainage      abdominal  . Esophagogastroduodenoscopy  10/05/2010    Procedure: ESOPHAGOGASTRODUODENOSCOPY (EGD);  Surgeon: [name], MD;  [facility];  Service: Endoscopy;  Laterality: N/A;  7:30 am  . Cirrhosis    . Esophagogastroduodenoscopy N/A 09/11/2013    Procedure: ESOPHAGOGASTRODUODENOSCOPY (EGD);  Surgeon: [name], MD;  Location: [facility];  Service: Endoscopy;  Laterality: N/A;  . Ileoscopy  09/11/2013    Procedure: ILEOSCOPY THROUGH STOMA;  Surgeon: [name], MD;  Location: [facility];  Service: Endoscopy;;  . Givens capsule study N/A 09/12/2013    Procedure: GIVENS CAPSULE STUDY;  Surgeon: [name], MD;  Location: [facility];  Service: Endoscopy;  Laterality: N/A;  . Givens capsule study N/A 11/25/2013  Procedure: GIVENS CAPSULE STUDY;  Surgeon: [name], MD;  Location: [facility];  Service: Endoscopy;  Laterality: N/A;  . Varices cauterization Right     in the stoma of her ileostomy  . Tips procedure  11/2013    [facility]   . Abdominal surgery      Prior to Admission medications   Medication Sig Start Date End Date Taking? Authorizing Provider  ALPRAZolam (XANAX) 1 MG tablet TAKE 1 TABLET BY MOUTH AT BEDTIME AS NEEDED Patient taking differently: TAKE 1 TABLET BY MOUTH AT BEDTIME AS NEEDED ANXIETY 05/05/15  Yes Junie Spencer,  FNP  Cholecalciferol (VITAMIN D-1000 MAX ST) 1000 UNITS tablet Take 1,000 Units by mouth daily.   Yes Historical Provider, MD  feeding supplement, ENSURE ENLIVE, (ENSURE ENLIVE) LIQD Take 237 mLs by mouth daily. 03/27/15  Yes Standley Brooking, MD  furosemide (LASIX) 20 MG tablet Take 1 tablet (20 mg total) by mouth daily. 04/22/15  Yes Erick Blinks, MD  gabapentin (NEURONTIN) 100 MG capsule Take 1 capsule (100 mg total) by mouth 3 (three) times daily. 04/23/15  Yes Ernestina Penna, MD  lactulose (CHRONULAC) 10 GM/15ML solution Take 30 mLs (20 g total) by mouth 2 (two) times daily. Patient taking differently: Take 20 g by mouth 3 (three) times daily.  03/27/15  Yes Standley Brooking, MD  mesalamine (CANASA) 1000 MG suppository Place 1 suppository (1,000 mg total) rectally at bedtime. 04/22/15  Yes Erick Blinks, MD  Multiple Vitamins-Minerals (MULTIVITAMIN WITH MINERALS) tablet Take 1 tablet by mouth daily.   Yes Historical Provider, MD  nadolol (CORGARD) 20 MG tablet Take 0.5 tablets (10 mg total) by mouth daily. 05/19/15  Yes Junie Spencer, FNP  pantoprazole (PROTONIX) 40 MG tablet TAKE 1 TABLET BY MOUTH DAILY 06/09/14  Yes Len Blalock, NP  potassium chloride (KLOR-CON) 20 MEQ packet Take 20 mEq by mouth daily. 06/02/15  Yes Junie Spencer, FNP  Probiotic Product (PROBIOTIC PO) Take 1 tablet by mouth daily.   Yes Historical Provider, MD  spironolactone (ALDACTONE) 25 MG tablet Take 25 mg by mouth daily.  05/31/15  Yes Historical Provider, MD  potassium chloride 20 MEQ TBCR Take 40 mEq by mouth 2 (two) times daily. For 3 days. Afterwards, take your home dose of 20 meq daily. Patient not taking: Reported on 06/24/2015 06/14/15   Henderson Cloud, MD    Current Facility-Administered Medications  Medication Dose Route Frequency Provider Last Rate Last Dose  . acetaminophen (TYLENOL) tablet 650 mg  650 mg Oral Q6H PRN Briscoe Deutscher, MD      . ALPRAZolam Prudy Feeler) tablet 0.5 mg  0.5 mg Oral BID  Leroy Sea, MD   0.5 mg at 06/26/15 1008  . cholecalciferol (VITAMIN D) tablet 1,000 Units  1,000 Units Oral Daily Briscoe Deutscher, MD   1,000 Units at 06/26/15 1008  . feeding supplement (ENSURE ENLIVE) (ENSURE ENLIVE) liquid 237 mL  237 mL Oral Q24H Briscoe Deutscher, MD   237 mL at 06/25/15 2030  . feeding supplement (PRO-STAT SUGAR FREE 64) liquid 30 mL  30 mL Oral TID WC Leroy Sea, MD   30 mL at 06/26/15 1300  . heparin injection 5,000 Units  5,000 Units Subcutaneous Q8H Briscoe Deutscher, MD   5,000 Units at 06/26/15 1301  . HYDROcodone-acetaminophen (NORCO/VICODIN) 5-325 MG per tablet 1 tablet  1 tablet Oral Q6H PRN Leroy Sea, MD   1 tablet at 06/25/15 1631  .  lactulose (CHRONULAC) 10 GM/15ML solution 30 g  30 g Oral TID Leroy Sea, MD   30 g at 06/26/15 1008  . mesalamine (CANASA) suppository 1,000 mg  1,000 mg Rectal QHS Lavone Neri Opyd, MD   1,000 mg at 06/25/15 2312  . midodrine (PROAMATINE) tablet 5 mg  5 mg Oral TID WC Leroy Sea, MD   5 mg at 06/26/15 1301  . multivitamin with minerals tablet 1 tablet  1 tablet Oral Daily Briscoe Deutscher, MD   1 tablet at 06/26/15 1008  . nadolol (CORGARD) tablet 10 mg  10 mg Oral Daily Briscoe Deutscher, MD   10 mg at 06/26/15 1300  . ondansetron (ZOFRAN) injection 4 mg  4 mg Intravenous Q6H PRN Lavone Neri Opyd, MD      . pantoprazole (PROTONIX) EC tablet 40 mg  40 mg Oral Daily Briscoe Deutscher, MD   40 mg at 06/26/15 1008  . rifaximin (XIFAXAN) tablet 550 mg  550 mg Oral BID Leroy Sea, MD   550 mg at 06/26/15 1008  . sodium chloride flush (NS) 0.9 % injection 3 mL  3 mL Intravenous Q12H Lavone Neri Opyd, MD   3 mL at 06/25/15 2200  . spironolactone (ALDACTONE) tablet 50 mg  50 mg Oral Daily Salomon Mast, MD   50 mg at 06/26/15 1008  . torsemide (DEMADEX) tablet 20 mg  20 mg Oral Daily Salomon Mast, MD   20 mg at 06/26/15 1008    Allergies as of 06/24/2015 - Review Complete 06/24/2015  Allergen Reaction Noted  .  Penicillins Rash 08/19/2010    Family History  Problem Relation Age of Onset  . Cancer Mother   . Stroke Father     Social History   Social History  . Marital Status: Married    Spouse Name: N/A  . Number of Children: N/A  . Years of Education: N/A   Occupational History  . Not on file.   Social History Main Topics  . Smoking status: Never Smoker   . Smokeless tobacco: Never Used  . Alcohol Use: No  . Drug Use: No  . Sexual Activity: Yes   Other Topics Concern  . Not on file   Social History Narrative    Review of Systems: See HPI, otherwise normal ROS  Physical Exam: Temp:  [97.4 F (36.3 C)-97.7 F (36.5 C)] 97.7 F (36.5 C) (05/19 1458) Pulse Rate:  [67-78] 67 (05/19 1458) Resp:  [18] 18 (05/19 1458) BP: (109-113)/(39-58) 109/39 mmHg (05/19 1458) SpO2:  [100 %] 100 % (05/19 1458) Weight:  [203 lb 0.7 oz (92.1 kg)] 203 lb 0.7 oz (92.1 kg) (05/19 0645) Last BM Date: 06/26/15 Patient is alert and in no acute distress. She appears to be chronically ill and speaks slowly but very clearly. Conjunctiva is pale. Sclerae nonicteric. Oropharyngeal mucosa is normal. No neck masses or thyromegaly noted. Cardiac exam with regular rhythm normal S1 and S2. No murmur or gallop noted. Lungs are clear to auscultation. Abdomen is full. Ileostomy is located in right side of her abdomen and ileostomy bag is yellowish green stool. Peristomal hernia noted above ileostomy. Abdomen is soft and nontender without organomegaly or masses. She has multiple ecchymosis over both forearms. She has 3+ pitting edema involving both her legs. She has some edema on the inner thighs as well.   Lab Results:  Recent Labs  06/24/15 1623 06/25/15 0541 06/26/15 0501  WBC 5.6 4.6 5.2  HGB 8.5* 8.3*  8.2*  HCT 25.2* 24.9* 24.6*  PLT 92* 93* 90*   BMET  Recent Labs  06/25/15 0541 06/25/15 1658 06/25/15 1845 06/26/15 0501  NA 132*  --  131* 132*  K 2.3* >7.5* 3.2* 3.7  CL 113*  --   115* 115*  CO2 15*  --  14* 15*  GLUCOSE 168*  --  150* 131*  BUN 40*  --  37* 40*  CREATININE 1.78*  --  1.87* 1.76*  CALCIUM 8.5*  --  8.2* 8.4*   LFT  Recent Labs  06/25/15 0541  PROT 4.6*  ALBUMIN 2.3*  AST 24  ALT 20  ALKPHOS 107  BILITOT 1.0   PT/INR  Recent Labs  06/24/15 1623 06/25/15 1208  LABPROT 18.2* 19.5*  INR 1.50* 1.65*    Assessment;  Recurrent hepatic encephalopathy in a patient with history of advanced cirrhosis(secondary to hep C treated successfully and nonalcoholic steatohepatitis). Lactulose in the past seem to have helped. However lower GI tract is excluded/nonfunctional since she has ileostomy. Therefore lactulose and Xifaxan may not work unless she has small intestinal bacterial overgrowth. In any case this combination is worse trying. Hepatic function has gradually deteriorated since she had TIPS. If hepatic encephalopathy does not improve with therapy consideration should be given to reverse and TIPS. Patient unfortunately is not a candidate for liver transplant because of inflammatory bowel disease. Will check with transplant hepatologist anyway. Patient serum albumin is quite low. She will benefit from IV albumin and hopefully be can mobilize fluid from lower extremities.  Patient is up-to-date on screening for HCC. She had borderline AFP about 2 months ago and CT from 5 weeks ago did not reveal suspicious lesions in her liver. AFP will be repeated.  Recommendations;  Albumin 25 g IV daily 4 days. AFP with a.m. Lab.  Dr. Darrick Penna will see patient over the weekend.    LOS: 2 days   Burhan Barham U  06/26/2015, 5:21 PM

## 2015-06-26 NOTE — Progress Notes (Signed)
PROGRESS NOTE                                                                                                                                                                                                             Patient Demographics:    Kathryn [name], is a 60 y.o. female, DOB - 05/22/1955, ZOX:[id number]  Admit date - 06/24/2015   Admitting Physician Briscoe Deutscher, MD  Outpatient Primary MD for the patient is Nils Pyle, MD  LOS - 2    Chief Complaint  Patient presents with  . Abnormal Lab       Brief Narrative     Kathryn [name] is a 60 y.o. female with medical history significant for successfully-treated hep C, liver cirrhosis, chronic kidney disease stage III, and Crohn's disease status-post multiple surgeries and ileostomy who presents to the ED with several days of generalized weakness, dizziness, and confusion. She was found to Hep encephalopathy, ARF, Severe Hypokalemia, GI and Renal have been called.    Subjective:    Kathryn [name] today has, No headache, No chest pain, No abdominal pain - No Nausea, No new weakness tingling or numbness, No Cough - SOB.    Assessment  & Plan :     1.Hepatic encephalopathy. In a patient with hep C cirrhosis. Hep C was treated. Have placed her on lactulose 30 g 3 times a day along with Xifaxan, belly is soft, she is afebrile without leukocytosis. Mentation has improvedShe is now close to baseline. We'll consult her primary GI physician as well.   2. Hep C cirrhosis. Hep C was treated. Supportive care, continue beta blocker as tolerated by blood pressure, diuretics per renal as tolerated by blood pressure. Clinically no SBP. She has cirrhosis related mild thrombocytopenia, coagulopathy and hypoalbuminemia.  3. Hypokalemia. Placed and stable, PICC line had to be placed as she lost her IV with severe hypokalemia.  4. History of Crohn's disease. Status post  ileostomy, supportive care for now GI consulted. Continue mesalamine.  5. Anxiety. On benzodiazepine dose reduced due to encephalopathy.  6. Generalized weakness. Due to #1 above. Increase activity. PT evaluation.  7. ARF on CK D stage III with evidence of both hypotension and third spacing with edema. Albumin is quite low likely cirrhosis related, placed on protein supplements along with midodrine. No proteinuria. Seen  by renal, now IV fluids stopped and started on low dose diuretics by renal. Will monitor blood pressure and renal function along with electrolytes.  8. Anemia of chronic disease. Monitor.  9. Hypotension. Due to intravascular dehydration and third spacing of fluid, she is post gentle IV fluids with stable blood pressures now. Placed on protein supplementation and midodrine. She is not septic.    Code Status :  Full  Family Communication  : None  Disposition Plan  :  Stay Inpt  Consults  :  Renal  Procedures  :    PICC placed 06/25/2015  DVT Prophylaxis  :    Heparin   Lab Results  Component Value Date   PLT 90* 06/26/2015    Inpatient Medications  Scheduled Meds: . ALPRAZolam  0.5 mg Oral BID  . cholecalciferol  1,000 Units Oral Daily  . feeding supplement (ENSURE ENLIVE)  237 mL Oral Q24H  . feeding supplement (PRO-STAT SUGAR FREE 64)  30 mL Oral TID WC  . heparin  5,000 Units Subcutaneous Q8H  . lactulose  30 g Oral TID  . mesalamine  1,000 mg Rectal QHS  . midodrine  5 mg Oral TID WC  . multivitamin with minerals  1 tablet Oral Daily  . nadolol  10 mg Oral Daily  . pantoprazole  40 mg Oral Daily  . rifaximin  550 mg Oral BID  . sodium chloride flush  3 mL Intravenous Q12H  . spironolactone  50 mg Oral Daily  . torsemide  20 mg Oral Daily   Continuous Infusions:  PRN Meds:.acetaminophen **OR** [DISCONTINUED] acetaminophen, HYDROcodone-acetaminophen, [DISCONTINUED] ondansetron **OR** ondansetron (ZOFRAN) IV  Antibiotics  :    Anti-infectives      Start     Dose/Rate Route Frequency Ordered Stop   06/25/15 1045  rifaximin (XIFAXAN) tablet 550 mg     550 mg Oral 2 times daily 06/25/15 1032           Objective:   Filed Vitals:   06/24/15 2351 06/25/15 0618 06/25/15 1559 06/26/15 0645  BP: 103/55 96/42 112/53 113/58  Pulse: 64 58 79 78  Temp: 97.7 F (36.5 C) 97.7 F (36.5 C) 97.5 F (36.4 C) 97.4 F (36.3 C)  TempSrc: Oral Oral Oral Oral  Resp: 20 17 20 18   Height:      Weight: 92.9 kg (204 lb 12.9 oz) 92.262 kg (203 lb 6.4 oz)  92.1 kg (203 lb 0.7 oz)  SpO2: 100% 100% 100% 100%    Wt Readings from Last 3 Encounters:  06/26/15 92.1 kg (203 lb 0.7 oz)  06/23/15 92.987 kg (205 lb)  06/19/15 83.915 kg (185 lb)     Intake/Output Summary (Last 24 hours) at 06/26/15 0943 Last data filed at 06/26/15 0817  Gross per 24 hour  Intake    240 ml  Output      0 ml  Net    240 ml     Physical Exam  Awake, Mildly confused oriented 2, No new F.N deficits, Normal affect [place].AT,PERRAL Supple Neck,No JVD, No cervical lymphadenopathy appriciated.  Symmetrical Chest wall movement, Good air movement bilaterally, CTAB RRR,No Gallops,Rubs or new Murmurs, No Parasternal Heave +ve B.Sounds, Abd Soft, No tenderness, No organomegaly appriciated, No rebound - guarding or rigidity. Ileostomy site stable. No Cyanosis, Clubbing 2 + edema, No new Rash or bruise       Data Review:    CBC  Recent Labs Lab 06/23/15 1711 06/24/15 1623 06/25/15 0541 06/26/15 0501  WBC 4.5 5.6 4.6 5.2  HGB 8.5* 8.5* 8.3* 8.2*  HCT 26.8* 25.2* 24.9* 24.6*  PLT 97* 92* 93* 90*  MCV 93.7 89.0 89.2 89.5  MCH 29.7 30.0 29.7 29.8  MCHC 31.7* 33.7 33.3 33.3  RDW 17.6* 18.1* 18.2* 18.7*  LYMPHSABS 1395  --  1.7  --   MONOABS 450  --  0.4  --   EOSABS 135  --  0.1  --   BASOSABS 0  --  0.0  --     Chemistries   Recent Labs Lab 06/23/15 1117 06/23/15 1708 06/24/15 1623 06/25/15 0541 06/25/15 1658 06/25/15 1845 06/26/15 0501  NA 133*  --   129* 132*  --  131* 132*  K 2.5*  --  2.2* 2.3* >7.5* 3.2* 3.7  CL 103  --  110 113*  --  115* 115*  CO2 13*  --  14* 15*  --  14* 15*  GLUCOSE 127*  --  122* 168*  --  150* 131*  BUN 38*  --  43* 40*  --  37* 40*  CREATININE 2.11*  --  2.06* 1.78*  --  1.87* 1.76*  CALCIUM 8.9  --  8.3* 8.5*  --  8.2* 8.4*  MG  --   --  1.6* 2.1 2.0  --   --   AST --   --   --   ALT --   --   --   ALKPHOS 157* 127 119 107  --   --   --   BILITOT 0.9 1.2 1.0 1.0  --   --   --    ------------------------------------------------------------------------------------------------------------------ No results for input(s): CHOL, HDL, LDLCALC, TRIG, CHOLHDL, LDLDIRECT in the last 72 hours.  Lab Results  Component Value Date   HGBA1C 4.9% 02/13/2014   ------------------------------------------------------------------------------------------------------------------ No results for input(s): TSH, T4TOTAL, T3FREE, THYROIDAB in the last 72 hours.  Invalid input(s): FREET3 ------------------------------------------------------------------------------------------------------------------ No results for input(s): VITAMINB12, FOLATE, FERRITIN, TIBC, IRON, RETICCTPCT in the last 72 hours.  Coagulation profile  Recent Labs Lab 06/23/15 1117 06/24/15 1623 06/25/15 1208  INR 1.1 1.50* 1.65*    No results for input(s): DDIMER in the last 72 hours.  Cardiac Enzymes No results for input(s): CKMB, TROPONINI, MYOGLOBIN in the last 168 hours.  Invalid input(s): CK ------------------------------------------------------------------------------------------------------------------    Component Value Date/Time   BNP 74.0 03/26/2015 0345    Micro Results Recent Results (from the past 240 hour(s))  MRSA PCR Screening     Status: None   Collection Time: 06/25/15  8:15 PM  Result Value Ref Range Status   MRSA by PCR NEGATIVE NEGATIVE Final    Comment:        The GeneXpert MRSA Assay  (FDA approved for NASAL specimens only), is one component of a comprehensive MRSA colonization surveillance program. It is not intended to diagnose MRSA infection nor to guide or monitor treatment for MRSA infections.     Radiology Reports    Time Spent in minutes  30   Susa Raring K M.D on 06/26/2015 at 9:43 AM  Between 7am to 7pm - Pager - [phone]  After 7pm go to [email or web address] - password [facility]  [facility] -  Office  [phone]

## 2015-06-26 NOTE — Consult Note (Signed)
Reason for Consult: Renal failure and leg edema Referring Physician: Dr. Jennette Kettle  Kathryn [name] is an 60 y.o. female.  HPI: She is a patient with history of hepatitis C infection status post treatment, liver cirrhosis, history of Crohn's disease status post ileostomy placement presently came with complaints of weakness, dizziness and confusion. When she was evaluated patient was found to have elevated ammonia, worsening of renal failure and hyperkalemia. Presently patient seems to be feeling much better. Her main complaint seems to be still leg swelling. She denies any difficulty breathing.  Past Medical History  Diagnosis Date  . Hypertension   . Enteritis presumed infectious   . Crohn's   . Anemia   . Hepatitis C antibody test positive   . Hepatitis   . Renal insufficiency   . Cirrhosis ([place])   . Splenomegaly   . Bilateral leg edema 01/2014  . Chronic back pain   . Thrombocytopenia [facility])     Past Surgical History  Procedure Laterality Date  . Colon surgery      MULTIPLE SURGERIES FOR CROHNS  . Ileostomy      multiple abdominal surgeries for Crohn's by Dr. Anthony Sar  . Cholecystectomy    . Abscess drainage      abdominal  . Esophagogastroduodenoscopy  10/05/2010    Procedure: ESOPHAGOGASTRODUODENOSCOPY (EGD);  Surgeon: Rogene Houston, MD;  [facility];  Service: Endoscopy;  Laterality: N/A;  7:30 am  . Cirrhosis    . Esophagogastroduodenoscopy N/A 09/11/2013    Procedure: ESOPHAGOGASTRODUODENOSCOPY (EGD);  Surgeon: Rogene Houston, MD;  Location: [facility];  Service: Endoscopy;  Laterality: N/A;  . Ileoscopy  09/11/2013    Procedure: ILEOSCOPY THROUGH STOMA;  Surgeon: Rogene Houston, MD;  Location: [facility];  Service: Endoscopy;;  . Givens capsule study N/A 09/12/2013    Procedure: GIVENS CAPSULE STUDY;  Surgeon: Rogene Houston, MD;  [facility];  Service: Endoscopy;  Laterality: N/A;  . Givens capsule study N/A 11/25/2013    Procedure: GIVENS  CAPSULE STUDY;  Surgeon: Rogene Houston, MD;  Location: [facility];  Service: Endoscopy;  Laterality: N/A;  . Varices cauterization Right     in the stoma of her ileostomy  . Tips procedure  11/2013    [facility]   . Abdominal surgery      Family History  Problem Relation Age of Onset  . Cancer Mother   . Stroke Father     Social History:  reports that she has never smoked. She has never used smokeless tobacco. She reports that she does not drink alcohol or use illicit drugs.  Allergies:  Allergies  Allergen Reactions  . Penicillins Rash    Has patient had a PCN reaction causing immediate rash, facial/tongue/throat swelling, SOB or lightheadedness with hypotension: Yes Has patient had a PCN reaction causing severe rash involving mucus membranes or skin necrosis: No Has patient had a PCN reaction that required hospitalization No Has patient had a PCN reaction occurring within the last 10 years: No If all of the above answers are "NO", then may proceed with Cephalosporin use.     Medications: I have reviewed the patient's current medications.  Results for orders placed or performed during the hospital encounter of 06/24/15 (from the past 48 hour(s))  CBC     Status: Abnormal   Collection Time: 06/24/15  4:23 PM  Result Value Ref Range   WBC 5.6 4.0 - 10.5 K/uL   RBC 2.83 (  L) 3.87 - 5.11 MIL/uL   Hemoglobin 8.5 (L) 12.0 - 15.0 g/dL   HCT 25.2 (L) 36.0 - 46.0 %   MCV 89.0 78.0 - 100.0 fL   MCH 30.0 26.0 - 34.0 pg   MCHC 33.7 30.0 - 36.0 g/dL   RDW 18.1 (H) 11.5 - 15.5 %   Platelets 92 (L) 150 - 400 K/uL    Comment: PLATELET COUNT CONFIRMED BY SMEAR PLATELETS APPEAR DECREASED   Comprehensive metabolic panel     Status: Abnormal   Collection Time: 06/24/15  4:23 PM  Result Value Ref Range   Sodium 129 (L) 135 - 145 mmol/L   Potassium 2.2 (LL) 3.5 - 5.1 mmol/L    Comment: CRITICAL RESULT CALLED TO, READ BACK BY AND VERIFIED WITH: JJ AT 1800 ON 06/24/2015 BY ISLEY,B     Chloride 110 101 - 111 mmol/L   CO2 14 (L) 22 - 32 mmol/L   Glucose, Bld 122 (H) 65 - 99 mg/dL   BUN 43 (H) 6 - 20 mg/dL   Creatinine, Ser 2.06 (H) 0.44 - 1.00 mg/dL   Calcium 8.3 (L) 8.9 - 10.3 mg/dL   Total Protein 4.9 (L) 6.5 - 8.1 g/dL   Albumin 2.5 (L) 3.5 - 5.0 g/dL   AST 26 15 - 41 U/L   ALT 22 14 - 54 U/L   Alkaline Phosphatase 119 38 - 126 U/L   Total Bilirubin 1.0 0.3 - 1.2 mg/dL   GFR calc non Af Amer 25 (L) >60 mL/min   GFR calc Af Amer 29 (L) >60 mL/min    Comment: (NOTE) The eGFR has been calculated using the CKD EPI equation. This calculation has not been validated in all clinical situations. eGFR's persistently <60 mL/min signify possible Chronic Kidney Disease.    Anion gap 5 5 - 15  Protime-INR     Status: Abnormal   Collection Time: 06/24/15  4:23 PM  Result Value Ref Range   Prothrombin Time 18.2 (H) 11.6 - 15.2 seconds   INR 1.50 (H) 0.00 - 1.49  Magnesium     Status: Abnormal   Collection Time: 06/24/15  4:23 PM  Result Value Ref Range   Magnesium 1.6 (L) 1.7 - 2.4 mg/dL  Ammonia     Status: Abnormal   Collection Time: 06/24/15  4:26 PM  Result Value Ref Range   Ammonia 138 (H) 9 - 35 umol/L  Urinalysis, Routine w reflex microscopic (not at [facility])     Status: Abnormal   Collection Time: 06/24/15  5:40 PM  Result Value Ref Range   Color, Urine YELLOW YELLOW   APPearance CLEAR CLEAR   Specific Gravity, Urine 1.010 1.005 - 1.030   pH 5.5 5.0 - 8.0   Glucose, UA NEGATIVE NEGATIVE mg/dL   Hgb urine dipstick NEGATIVE NEGATIVE   Bilirubin Urine NEGATIVE NEGATIVE   Ketones, ur NEGATIVE NEGATIVE mg/dL   Protein, ur NEGATIVE NEGATIVE mg/dL   Nitrite NEGATIVE NEGATIVE   Leukocytes, UA SMALL (A) NEGATIVE  Urine microscopic-add on     Status: Abnormal   Collection Time: 06/24/15  5:40 PM  Result Value Ref Range   Squamous Epithelial / LPF 0-5 (A) NONE SEEN   WBC, UA 0-5 0 - 5 WBC/hpf   RBC / HPF NONE SEEN 0 - 5 RBC/hpf   Bacteria, UA RARE (A) NONE SEEN   Comprehensive metabolic panel     Status: Abnormal   Collection Time: 06/25/15  5:41 AM  Result Value Ref Range  Sodium 132 (L) 135 - 145 mmol/L   Potassium 2.3 (LL) 3.5 - 5.1 mmol/L    Comment: CRITICAL RESULT CALLED TO, READ BACK BY AND VERIFIED WITH: VAUGHN,J AT 7:10AM ON 06/25/15 BY FESTERMAN,C    Chloride 113 (H) 101 - 111 mmol/L   CO2 15 (L) 22 - 32 mmol/L   Glucose, Bld 168 (H) 65 - 99 mg/dL   BUN 40 (H) 6 - 20 mg/dL   Creatinine, Ser 1.78 (H) 0.44 - 1.00 mg/dL   Calcium 8.5 (L) 8.9 - 10.3 mg/dL   Total Protein 4.6 (L) 6.5 - 8.1 g/dL   Albumin 2.3 (L) 3.5 - 5.0 g/dL   AST 24 15 - 41 U/L   ALT 20 14 - 54 U/L   Alkaline Phosphatase 107 38 - 126 U/L   Total Bilirubin 1.0 0.3 - 1.2 mg/dL   GFR calc non Af Amer 30 (L) >60 mL/min   GFR calc Af Amer 35 (L) >60 mL/min    Comment: (NOTE) The eGFR has been calculated using the CKD EPI equation. This calculation has not been validated in all clinical situations. eGFR's persistently <60 mL/min signify possible Chronic Kidney Disease.    Anion gap 4 (L) 5 - 15  CBC WITH DIFFERENTIAL     Status: Abnormal   Collection Time: 06/25/15  5:41 AM  Result Value Ref Range   WBC 4.6 4.0 - 10.5 K/uL   RBC 2.79 (L) 3.87 - 5.11 MIL/uL   Hemoglobin 8.3 (L) 12.0 - 15.0 g/dL   HCT 24.9 (L) 36.0 - 46.0 %   MCV 89.2 78.0 - 100.0 fL   MCH 29.7 26.0 - 34.0 pg   MCHC 33.3 30.0 - 36.0 g/dL   RDW 18.2 (H) 11.5 - 15.5 %   Platelets 93 (L) 150 - 400 K/uL    Comment: SPECIMEN CHECKED FOR CLOTS PLATELET COUNT CONFIRMED BY SMEAR    Neutrophils Relative % 50 %   Neutro Abs 2.3 1.7 - 7.7 K/uL   Lymphocytes Relative 38 %   Lymphs Abs 1.7 0.7 - 4.0 K/uL   Monocytes Relative 8 %   Monocytes Absolute 0.4 0.1 - 1.0 K/uL   Eosinophils Relative 2 %   Eosinophils Absolute 0.1 0.0 - 0.7 K/uL   Basophils Relative 1 %   Basophils Absolute 0.0 0.0 - 0.1 K/uL  Magnesium     Status: None   Collection Time: 06/25/15  5:41 AM  Result Value Ref Range    Magnesium 2.1 1.7 - 2.4 mg/dL  Ammonia     Status: Abnormal   Collection Time: 06/25/15  5:42 AM  Result Value Ref Range   Ammonia 132 (H) 9 - 35 umol/L  Glucose, capillary     Status: Abnormal   Collection Time: 06/25/15  7:44 AM  Result Value Ref Range   Glucose-Capillary 141 (H) 65 - 99 mg/dL  Protime-INR     Status: Abnormal   Collection Time: 06/25/15 12:08 PM  Result Value Ref Range   Prothrombin Time 19.5 (H) 11.6 - 15.2 seconds   INR 1.65 (H) 0.00 - 1.49  Potassium     Status: Abnormal   Collection Time: 06/25/15  4:58 PM  Result Value Ref Range   Potassium >7.5 (HH) 3.5 - 5.1 mmol/L    Comment: DELTA CHECK NOTED CRITICAL RESULT CALLED TO, READ BACK BY AND VERIFIED WITH: VAUGHN,J ON 06/25/15 AT 1820 BY LOY,C   Magnesium     Status: None   Collection Time: 06/25/15  4:58 PM  Result Value Ref Range   Magnesium 2.0 1.7 - 2.4 mg/dL  Basic metabolic panel     Status: Abnormal   Collection Time: 06/25/15  6:45 PM  Result Value Ref Range   Sodium 131 (L) 135 - 145 mmol/L   Potassium 3.2 (L) 3.5 - 5.1 mmol/L    Comment: DELTA CHECK NOTED   Chloride 115 (H) 101 - 111 mmol/L   CO2 14 (L) 22 - 32 mmol/L   Glucose, Bld 150 (H) 65 - 99 mg/dL   BUN 37 (H) 6 - 20 mg/dL   Creatinine, Ser 1.87 (H) 0.44 - 1.00 mg/dL   Calcium 8.2 (L) 8.9 - 10.3 mg/dL   GFR calc non Af Amer 28 (L) >60 mL/min   GFR calc Af Amer 33 (L) >60 mL/min    Comment: (NOTE) The eGFR has been calculated using the CKD EPI equation. This calculation has not been validated in all clinical situations. eGFR's persistently <60 mL/min signify possible Chronic Kidney Disease.    Anion gap 2 (L) 5 - 15  MRSA PCR Screening     Status: None   Collection Time: 06/25/15  8:15 PM  Result Value Ref Range   MRSA by PCR NEGATIVE NEGATIVE    Comment:        The GeneXpert MRSA Assay (FDA approved for NASAL specimens only), is one component of a comprehensive MRSA colonization surveillance program. It is not intended to  diagnose MRSA infection nor to guide or monitor treatment for MRSA infections.   CBC     Status: Abnormal   Collection Time: 06/26/15  5:01 AM  Result Value Ref Range   WBC 5.2 4.0 - 10.5 K/uL   RBC 2.75 (L) 3.87 - 5.11 MIL/uL   Hemoglobin 8.2 (L) 12.0 - 15.0 g/dL   HCT 24.6 (L) 36.0 - 46.0 %   MCV 89.5 78.0 - 100.0 fL   MCH 29.8 26.0 - 34.0 pg   MCHC 33.3 30.0 - 36.0 g/dL   RDW 18.7 (H) 11.5 - 15.5 %   Platelets 90 (L) 150 - 400 K/uL    Comment: SPECIMEN CHECKED FOR CLOTS PLATELET COUNT CONFIRMED BY SMEAR   Basic metabolic panel     Status: Abnormal   Collection Time: 06/26/15  5:01 AM  Result Value Ref Range   Sodium 132 (L) 135 - 145 mmol/L   Potassium 3.7 3.5 - 5.1 mmol/L   Chloride 115 (H) 101 - 111 mmol/L   CO2 15 (L) 22 - 32 mmol/L   Glucose, Bld 131 (H) 65 - 99 mg/dL   BUN 40 (H) 6 - 20 mg/dL   Creatinine, Ser 1.76 (H) 0.44 - 1.00 mg/dL   Calcium 8.4 (L) 8.9 - 10.3 mg/dL   GFR calc non Af Amer 31 (L) >60 mL/min   GFR calc Af Amer 35 (L) >60 mL/min    Comment: (NOTE) The eGFR has been calculated using the CKD EPI equation. This calculation has not been validated in all clinical situations. eGFR's persistently <60 mL/min signify possible Chronic Kidney Disease.   Glucose, capillary     Status: Abnormal   Collection Time: 06/26/15  7:54 AM  Result Value Ref Range   Glucose-Capillary 157 (H) 65 - 99 mg/dL    No results found.  Review of Systems  Constitutional: Positive for malaise/fatigue. Negative for fever.  Respiratory: Negative for shortness of breath.   Cardiovascular: Positive for leg swelling. Negative for orthopnea.  Gastrointestinal: Negative for nausea and  vomiting.  Neurological: Positive for weakness.   Blood pressure 113/58, pulse 78, temperature 97.4 F (36.3 C), temperature source Oral, resp. rate 18, height 5' 5"  (1.651 m), weight 92.1 kg (203 lb 0.7 oz), SpO2 100 %. Physical Exam  Constitutional: She is oriented to person, place, and  time. No distress.  Eyes: No scleral icterus.  Neck: No JVD present.  Cardiovascular: Normal rate and regular rhythm.   Respiratory: No respiratory distress. She has no wheezes.  Decreased breath sounds bilaterally  GI: There is no tenderness.  Full  Musculoskeletal: She exhibits edema.  Neurological: She is alert and oriented to person, place, and time.    Assessment/Plan: Problem #1 acute on chronic renal failure: Presently her BUN and creatinine has improved. Most likely secondary to prerenal syndrome/ATN. Presently her creatinine is more or less her baseline. Patient admitted multiple times for similar issue. Problem #2 chronic renal failure: Stage III Problem #3 history of anasarca: This is associated from uncontrolled salt and fluid intake/hypoalbuminemia associated with liver cirrhosis. Presently patient is asymptomatic but seems to have significant edema. Problem #4 hypokalemia: Patient has received some potassium supplement and presently her potassium is normal. Problem #5 history of altered mental status: Secondary to hepatic encephalopathy. Presently patient has returned to her baseline. Problem #6 hypovolemic hyponatremia: Sodium stable Problem #7 history of Crohn's disease Problem #8 history of hepatitis C infection Plan: 1] We'll start patient on Aldactone 50 mg by mouth daily 2] Demadex 20 mg once a day 3] patient advised to decrease her salt and fluid intake 4] we'll check her renal panel in the morning.  Siara Gorder S 06/26/2015, 9:23 AM

## 2015-06-26 NOTE — Care Management Note (Signed)
Case Management Note  Patient Details  Name: Kathryn [name] MRN: [id number] Date of Birth: 08/30/1955  Expected Discharge Date:      06/27/2015            Expected Discharge Plan:  Home w Home Health Services  In-House Referral:  NA  Discharge planning Services  CM Consult  Post Acute Care Choice:  Home Health Choice offered to:  Patient  DME Arranged:    DME Agency:     HH Arranged:    HH Agency:  [facility]  Status of Service:  Completed, signed off  Medicare Important Message Given:    Date Medicare IM Given:    Medicare IM give by:    Date Additional Medicare IM Given:    Additional Medicare Important Message give by:     If discussed at Long Length of Stay Meetings, dates discussed:    Additional Comments: Pt anticipates DC home over weekend. Pt aware HH has 48 hours to initiate services. Alroy Bailiff, of [facility], made aware of referral and anticipated DC date and will obtain pt info from chart. If discharging over weekend, RN will make [facility] aware of DC date.   Malcolm Metro, RN 06/26/2015, 12:48 PM

## 2015-06-26 NOTE — Evaluation (Signed)
Physical Therapy Evaluation Patient Details Name: Kathryn [name] MRN: [id number] DOB: 04-Aug-1955 Today's Date: 06/26/2015   History of Present Illness  60 yo F admitted with LE edema, hepatic encephalopathy, and severe hypokalemia.  PMH: Hepatitis C, Crohn's disease stage III, ileostomy, anemia, renal insufficiency, splenomegaly, chronic back pain, thrombocytopenia, TIPS, abdominal surgery.   Clinical Impression  Pt received in bed, and was agreeable to PT evaluation.  Pt expressed that she normally lives with her husband, and does not use any DME for ambulation.  She does mention that for the past 2-3 weeks her husband has needed to assist her with ADL's including dressing and bathing due to difficulty lifting her LE's.  Today, pt demonstrates bed mobility, and transfers at Mod (I) level, and ambulated 471ft with supervision and no AD.  Pt does not demonstrate need for skilled PT at this time, therefore, will sign off.     Follow Up Recommendations No PT follow up    Equipment Recommendations  None recommended by PT    Recommendations for Other Services       Precautions / Restrictions Restrictions Weight Bearing Restrictions: No      Mobility  Bed Mobility Overal bed mobility: Modified Independent                Transfers Overall transfer level: Modified independent                  Ambulation/Gait Ambulation/Gait assistance: Supervision Ambulation Distance (Feet): 400 Feet Assistive device: None Gait Pattern/deviations: WFL(Within Functional Limits)        Stairs            Wheelchair Mobility    Modified Rankin (Stroke Patients Only)       Balance Overall balance assessment: No apparent balance deficits (not formally assessed)                                           Pertinent Vitals/Pain Pain Assessment: No/denies pain    Home Living Family/patient expects to be discharged to:: Private residence Living  Arrangements: Spouse/significant other   Type of Home: House Home Access: Stairs to enter Entrance Stairs-Rails: Lawyer of Steps: 2 Home Layout: One level Home Equipment: None      Prior Function Level of Independence: Needs assistance   Gait / Transfers Assistance Needed: Pt ambulates indpendently  ADL's / Homemaking Assistance Needed: Pt states that she has required asistance for dressing and bathing from her husband for the last 2-3 weeks because she has been having difficulty lifting her LE's.         Hand Dominance        Extremity/Trunk Assessment   Upper Extremity Assessment: Overall WFL for tasks assessed           Lower Extremity Assessment: Overall WFL for tasks assessed         Communication   Communication: No difficulties  Cognition Arousal/Alertness: Awake/alert Behavior During Therapy: WFL for tasks assessed/performed Overall Cognitive Status: Within Functional Limits for tasks assessed                      General Comments      Exercises        Assessment/Plan    PT Assessment Patent does not need any further PT services  PT Diagnosis Generalized weakness   PT Problem List  PT Treatment Interventions     PT Goals (Current goals can be found in the Care Plan section) Acute Rehab PT Goals Patient Stated Goal: Pt wants to go home.  PT Goal Formulation: With patient Time For Goal Achievement: 07/03/15 Potential to Achieve Goals: Good    Frequency     Barriers to discharge        Co-evaluation               End of Session Equipment Utilized During Treatment: Gait belt Activity Tolerance: Patient tolerated treatment well Patient left: in chair;with call bell/phone within reach      Functional Assessment Tool Used: [employer] AM-PAC "6-clicks"  Functional Limitation: Mobility: Walking and moving around Mobility: Walking and Moving Around Current Status [phone]): At least 1  percent but less than 20 percent impaired, limited or restricted Mobility: Walking and Moving Around Goal Status [phone]): At least 1 percent but less than 20 percent impaired, limited or restricted Mobility: Walking and Moving Around Discharge Status [phone]): At least 1 percent but less than 20 percent impaired, limited or restricted    Time: 0853-0908 PT Time Calculation (min) (ACUTE ONLY): 15 min   Charges:   PT Evaluation $PT Eval Low Complexity: 1 Procedure     PT G Codes:   PT G-Codes **NOT FOR INPATIENT CLASS** Functional Assessment Tool Used: [employer] "6-clicks"  Functional Limitation: Mobility: Walking and moving around Mobility: Walking and Moving Around Current Status [phone]): At least 1 percent but less than 20 percent impaired, limited or restricted Mobility: Walking and Moving Around Goal Status [phone]): At least 1 percent but less than 20 percent impaired, limited or restricted Mobility: Walking and Moving Around Discharge Status [phone]): At least 1 percent but less than 20 percent impaired, limited or restricted    Wonda Amis 06/26/2015, 12:36 PM

## 2015-06-27 DIAGNOSIS — B1920 Unspecified viral hepatitis C without hepatic coma: Secondary | ICD-10-CM | POA: Diagnosis present

## 2015-06-27 DIAGNOSIS — K7460 Unspecified cirrhosis of liver: Secondary | ICD-10-CM | POA: Diagnosis present

## 2015-06-27 DIAGNOSIS — E8770 Fluid overload, unspecified: Secondary | ICD-10-CM | POA: Diagnosis not present

## 2015-06-27 DIAGNOSIS — E878 Other disorders of electrolyte and fluid balance, not elsewhere classified: Secondary | ICD-10-CM | POA: Diagnosis present

## 2015-06-27 DIAGNOSIS — G4700 Insomnia, unspecified: Secondary | ICD-10-CM | POA: Diagnosis not present

## 2015-06-27 DIAGNOSIS — D696 Thrombocytopenia, unspecified: Secondary | ICD-10-CM | POA: Diagnosis not present

## 2015-06-27 DIAGNOSIS — F419 Anxiety disorder, unspecified: Secondary | ICD-10-CM | POA: Diagnosis present

## 2015-06-27 DIAGNOSIS — D638 Anemia in other chronic diseases classified elsewhere: Secondary | ICD-10-CM | POA: Diagnosis present

## 2015-06-27 DIAGNOSIS — D61818 Other pancytopenia: Secondary | ICD-10-CM | POA: Diagnosis present

## 2015-06-27 DIAGNOSIS — I472 Ventricular tachycardia: Secondary | ICD-10-CM | POA: Diagnosis not present

## 2015-06-27 DIAGNOSIS — Z880 Allergy status to penicillin: Secondary | ICD-10-CM | POA: Diagnosis not present

## 2015-06-27 DIAGNOSIS — I9589 Other hypotension: Secondary | ICD-10-CM | POA: Diagnosis present

## 2015-06-27 DIAGNOSIS — B952 Enterococcus as the cause of diseases classified elsewhere: Secondary | ICD-10-CM | POA: Diagnosis not present

## 2015-06-27 DIAGNOSIS — D649 Anemia, unspecified: Secondary | ICD-10-CM | POA: Diagnosis not present

## 2015-06-27 DIAGNOSIS — E872 Acidosis: Secondary | ICD-10-CM | POA: Diagnosis present

## 2015-06-27 DIAGNOSIS — T502X5A Adverse effect of carbonic-anhydrase inhibitors, benzothiadiazides and other diuretics, initial encounter: Secondary | ICD-10-CM | POA: Diagnosis present

## 2015-06-27 DIAGNOSIS — N390 Urinary tract infection, site not specified: Secondary | ICD-10-CM | POA: Diagnosis present

## 2015-06-27 DIAGNOSIS — K7290 Hepatic failure, unspecified without coma: Secondary | ICD-10-CM | POA: Diagnosis present

## 2015-06-27 DIAGNOSIS — N183 Chronic kidney disease, stage 3 (moderate): Secondary | ICD-10-CM | POA: Diagnosis present

## 2015-06-27 DIAGNOSIS — Z9049 Acquired absence of other specified parts of digestive tract: Secondary | ICD-10-CM | POA: Diagnosis not present

## 2015-06-27 DIAGNOSIS — D684 Acquired coagulation factor deficiency: Secondary | ICD-10-CM | POA: Diagnosis present

## 2015-06-27 DIAGNOSIS — H1131 Conjunctival hemorrhage, right eye: Secondary | ICD-10-CM | POA: Diagnosis present

## 2015-06-27 DIAGNOSIS — R600 Localized edema: Secondary | ICD-10-CM | POA: Diagnosis present

## 2015-06-27 DIAGNOSIS — K7581 Nonalcoholic steatohepatitis (NASH): Secondary | ICD-10-CM | POA: Diagnosis present

## 2015-06-27 DIAGNOSIS — E860 Dehydration: Secondary | ICD-10-CM | POA: Diagnosis present

## 2015-06-27 DIAGNOSIS — E861 Hypovolemia: Secondary | ICD-10-CM | POA: Diagnosis present

## 2015-06-27 DIAGNOSIS — N179 Acute kidney failure, unspecified: Secondary | ICD-10-CM | POA: Diagnosis present

## 2015-06-27 DIAGNOSIS — E876 Hypokalemia: Secondary | ICD-10-CM | POA: Diagnosis present

## 2015-06-27 DIAGNOSIS — K219 Gastro-esophageal reflux disease without esophagitis: Secondary | ICD-10-CM | POA: Diagnosis present

## 2015-06-27 DIAGNOSIS — Z932 Ileostomy status: Secondary | ICD-10-CM | POA: Diagnosis not present

## 2015-06-27 DIAGNOSIS — I129 Hypertensive chronic kidney disease with stage 1 through stage 4 chronic kidney disease, or unspecified chronic kidney disease: Secondary | ICD-10-CM | POA: Diagnosis present

## 2015-06-27 DIAGNOSIS — E871 Hypo-osmolality and hyponatremia: Secondary | ICD-10-CM | POA: Diagnosis present

## 2015-06-27 DIAGNOSIS — K7469 Other cirrhosis of liver: Secondary | ICD-10-CM | POA: Diagnosis not present

## 2015-06-27 LAB — RENAL FUNCTION PANEL
ALBUMIN: 2.6 g/dL — AB (ref 3.5–5.0)
ANION GAP: 4 — AB (ref 5–15)
BUN: 47 mg/dL — ABNORMAL HIGH (ref 6–20)
CHLORIDE: 115 mmol/L — AB (ref 101–111)
CO2: 15 mmol/L — ABNORMAL LOW (ref 22–32)
Calcium: 8.7 mg/dL — ABNORMAL LOW (ref 8.9–10.3)
Creatinine, Ser: 1.72 mg/dL — ABNORMAL HIGH (ref 0.44–1.00)
GFR, EST AFRICAN AMERICAN: 36 mL/min — AB (ref >60)
GFR, EST NON AFRICAN AMERICAN: 31 mL/min — AB (ref >60)
Glucose, Bld: 101 mg/dL — ABNORMAL HIGH (ref 65–99)
PHOSPHORUS: 3.4 mg/dL (ref 2.5–4.6)
POTASSIUM: 3.1 mmol/L — AB (ref 3.5–5.1)
Sodium: 134 mmol/L — ABNORMAL LOW (ref 135–145)

## 2015-06-27 LAB — GLUCOSE, CAPILLARY: GLUCOSE-CAPILLARY: 104 mg/dL — AB (ref 65–99)

## 2015-06-27 MED ORDER — POTASSIUM CHLORIDE CRYS ER 20 MEQ PO TBCR
40.0000 meq | EXTENDED_RELEASE_TABLET | Freq: Once | ORAL | Status: AC
  Administered 2015-06-27: 40 meq via ORAL
  Filled 2015-06-27: qty 2

## 2015-06-27 MED ORDER — TORSEMIDE 20 MG PO TABS
40.0000 mg | ORAL_TABLET | Freq: Every day | ORAL | Status: DC
  Administered 2015-06-28: 40 mg via ORAL
  Filled 2015-06-27: qty 2

## 2015-06-27 MED ORDER — POTASSIUM CHLORIDE CRYS ER 20 MEQ PO TBCR
40.0000 meq | EXTENDED_RELEASE_TABLET | Freq: Two times a day (BID) | ORAL | Status: DC
  Administered 2015-06-27 – 2015-06-28 (×2): 40 meq via ORAL
  Filled 2015-06-27 (×2): qty 2

## 2015-06-27 NOTE — Progress Notes (Addendum)
PROGRESS NOTE                                                                                                                                                                                                             Patient Demographics:    Kathryn [name], is a 60 y.o. female, DOB - [birth year], AOZ:[id number]  Admit date - 06/24/2015   Admitting Physician Briscoe Deutscher, MD  Outpatient Primary MD for the patient is Nils Pyle, MD  LOS - 3    Chief Complaint  Patient presents with  . Abnormal Lab       Brief Narrative     Kathryn [name] is a 60 y.o. female with medical history significant for successfully-treated hep C, liver cirrhosis, chronic kidney disease stage III, and Crohn's disease status-post multiple surgeries and ileostomy who presents to the ED with several days of generalized weakness, dizziness, and confusion. She was found to Hep encephalopathy, ARF, Severe Hypokalemia, GI and Renal have been called.    Subjective:    Kathryn [name] today has, No headache, No chest pain, No abdominal pain - No Nausea, No new weakness tingling or numbness, No Cough - SOB.    Assessment  & Plan :      1.Hepatic encephalopathy. In a patient with hep C cirrhosis. Hep C was treated. Have placed her on lactulose 30 g 3 times a day along with Xifaxan, belly is soft, she is afebrile without leukocytosis. Mentation has improved. Primary GI service following. I discussed the case with her GI physician Dr. [name] on 06/26/2015, patient will likely require outpatient reversal of her TIPS procedure.   2. Hep C cirrhosis. Hep C was treated. Supportive care, continue beta blocker, diuretics and hold as blood pressure is soft. Clinically no SBP. She has cirrhosis related mild thrombocytopenia, coagulopathy and hypoalbuminemia. IV albumin ordered by GI for 4 days on 06/26/2015.  3. Hypokalemia. Replaced, will monitor along with  BCM, note she had PICC line placed for aggressive IV potassium replacement on the day of admission.  4. History of Crohn's disease. Status post ileostomy, supportive care for now GI consulted. Continue mesalamine.  5. Anxiety. On benzodiazepine dose reduced due to encephalopathy.  6. Generalized weakness. Due to #1 above. Increase activity. PT evaluation.  7. ARF on CK D stage III with evidence of  both hypotension and third spacing with edema. Albumin is quite low likely cirrhosis related, GI has placed her on IV albumin 4 days on 06/26/2015, placed on protein supplements along with midodrine, currently gentle hydration, hold IV fluids, renal consulted. No proteinuria. Nephrology following.  8. Anemia of chronic disease. Monitor.  9. Hypotension. Due to intravascular dehydration and third spacing of fluid, gentle hydration and monitor. Placed on protein supplementation and midodrine. She is not septic.  10. Hyperchloremic normal anion gap metabolic acidosis. Likely due to normal saline which he received initially along stool loss from ileostomy, question if she has RTA. We'll defer to nephrology.    Code Status :  Full  Family Communication  : None  Disposition Plan  :  Stay Inpt  Consults  :  Renal, GI  Procedures  :    PICC requested  DVT Prophylaxis  :    Heparin   Lab Results  Component Value Date   PLT 90* 06/26/2015    Inpatient Medications  Scheduled Meds: . albumin human  25 g Intravenous Daily  . ALPRAZolam  0.5 mg Oral BID  . cholecalciferol  1,000 Units Oral Daily  . feeding supplement (ENSURE ENLIVE)  237 mL Oral Q24H  . feeding supplement (PRO-STAT SUGAR FREE 64)  30 mL Oral TID WC  . heparin  5,000 Units Subcutaneous Q8H  . lactulose  30 g Oral TID  . mesalamine  1,000 mg Rectal QHS  . midodrine  5 mg Oral TID WC  . multivitamin with minerals  1 tablet Oral Daily  . nadolol  10 mg Oral Daily  . pantoprazole  40 mg Oral Daily  . rifaximin  550 mg Oral  BID  . sodium chloride flush  3 mL Intravenous Q12H  . spironolactone  50 mg Oral Daily  . torsemide  20 mg Oral Daily   Continuous Infusions:  PRN Meds:.acetaminophen **OR** [DISCONTINUED] acetaminophen, HYDROcodone-acetaminophen, [DISCONTINUED] ondansetron **OR** ondansetron (ZOFRAN) IV  Antibiotics  :    Anti-infectives    Start     Dose/Rate Route Frequency Ordered Stop   06/25/15 1045  rifaximin (XIFAXAN) tablet 550 mg     550 mg Oral 2 times daily 06/25/15 1032           Objective:   Filed Vitals:   06/26/15 0645 06/26/15 1458 06/26/15 2115 06/27/15 0456  BP: 113/58 109/39 103/55 109/59  Pulse: 78 67 71 73  Temp: 97.4 F (36.3 C) 97.7 F (36.5 C) 98.5 F (36.9 C) 97.7 F (36.5 C)  TempSrc: Oral Oral Oral Oral  Resp: 18 18 20    Height:      Weight: 92.1 kg (203 lb 0.7 oz)   91.717 kg (202 lb 3.2 oz)  SpO2: 100% 100% 100% 100%    Wt Readings from Last 3 Encounters:  06/27/15 91.717 kg (202 lb 3.2 oz)  06/23/15 92.987 kg (205 lb)  06/19/15 83.915 kg (185 lb)     Intake/Output Summary (Last 24 hours) at 06/27/15 0814 Last data filed at 06/27/15 0600  Gross per 24 hour  Intake   1060 ml  Output      0 ml  Net   1060 ml     Physical Exam  Awake, Mildly confused oriented 2, No new F.N deficits, Normal affect [place].AT,PERRAL Supple Neck,No JVD, No cervical lymphadenopathy appriciated.  Symmetrical Chest wall movement, Good air movement bilaterally, CTAB RRR,No Gallops,Rubs or new Murmurs, No Parasternal Heave +ve B.Sounds, Abd Soft, No tenderness, No organomegaly appriciated,  No rebound - guarding or rigidity. Ileostomy site stable. No Cyanosis, Clubbing 2 + edema, No new Rash or bruise       Data Review:    CBC  Recent Labs Lab 06/23/15 1711 06/24/15 1623 06/25/15 0541 06/26/15 0501  WBC 4.5 5.6 4.6 5.2  HGB 8.5* 8.5* 8.3* 8.2*  HCT 26.8* 25.2* 24.9* 24.6*  PLT 97* 92* 93* 90*  MCV 93.7 89.0 89.2 89.5  MCH 29.7 30.0 29.7 29.8  MCHC 31.7*  33.7 33.3 33.3  RDW 17.6* 18.1* 18.2* 18.7*  LYMPHSABS 1395  --  1.7  --   MONOABS 450  --  0.4  --   EOSABS 135  --  0.1  --   BASOSABS 0  --  0.0  --     Chemistries   Recent Labs Lab 06/23/15 1117 06/23/15 1708 06/24/15 1623 06/25/15 0541 06/25/15 1658 06/25/15 1845 06/26/15 0501 06/27/15 0642  NA 133*  --  129* 132*  --  131* 132* 134*  K 2.5*  --  2.2* 2.3* >7.5* 3.2* 3.7 3.1*  CL 103  --  110 113*  --  115* 115* 115*  CO2 13*  --  14* 15*  --  14* 15* 15*  GLUCOSE 127*  --  122* 168*  --  150* 131* 101*  BUN 38*  --  43* 40*  --  37* 40* 47*  CREATININE 2.11*  --  2.06* 1.78*  --  1.87* 1.76* 1.72*  CALCIUM 8.9  --  8.3* 8.5*  --  8.2* 8.4* 8.7*  MG  --   --  1.6* 2.1 2.0  --   --   --   AST 20 21 26 24   --   --   --   --   ALT 19 16 22 20   --   --   --   --   ALKPHOS 157* 127 119 107  --   --   --   --   BILITOT 0.9 1.2 1.0 1.0  --   --   --   --    ------------------------------------------------------------------------------------------------------------------ No results for input(s): CHOL, HDL, LDLCALC, TRIG, CHOLHDL, LDLDIRECT in the last 72 hours.  Lab Results  Component Value Date   HGBA1C 4.9% 02/13/2014   ------------------------------------------------------------------------------------------------------------------ No results for input(s): TSH, T4TOTAL, T3FREE, THYROIDAB in the last 72 hours.  Invalid input(s): FREET3 ------------------------------------------------------------------------------------------------------------------ No results for input(s): VITAMINB12, FOLATE, FERRITIN, TIBC, IRON, RETICCTPCT in the last 72 hours.  Coagulation profile  Recent Labs Lab 06/23/15 1117 06/24/15 1623 06/25/15 1208  INR 1.1 1.50* 1.65*    No results for input(s): DDIMER in the last 72 hours.  Cardiac Enzymes No results for input(s): CKMB, TROPONINI, MYOGLOBIN in the last 168 hours.  Invalid input(s):  CK ------------------------------------------------------------------------------------------------------------------    Component Value Date/Time   BNP 74.0 03/26/2015 0345    Micro Results Recent Results (from the past 240 hour(s))  MRSA PCR Screening     Status: None   Collection Time: 06/25/15  8:15 PM  Result Value Ref Range Status   MRSA by PCR NEGATIVE NEGATIVE Final    Comment:        The GeneXpert MRSA Assay (FDA approved for NASAL specimens only), is one component of a comprehensive MRSA colonization surveillance program. It is not intended to diagnose MRSA infection nor to guide or monitor treatment for MRSA infections.     Radiology Reports    Time Spent in minutes  30   [facility]  K M.D on 06/27/2015 at 8:14 AM  Between 7am to 7pm - Pager - [phone]  After 7pm go to [email or web address] - password [facility]  [facility] -  Office  [phone]

## 2015-06-27 NOTE — Progress Notes (Signed)
MD paged about Midodrine and nadolol due this morning. BP 103/58. MD gave verbal order for RN to give medication together. Stated "its okay" RN will continue to monitor. Lesly Dukes, RN

## 2015-06-27 NOTE — Progress Notes (Signed)
Patient ID: Kathryn [name], female   DOB: December 25, 1955, 60 y.o.   MRN: [id number]   Assessment/Plan: ADMITED WITH MS CHANGES AND FLUID OVERLOAD. CLINICALLY IMPROVED.  PLAN: 1. CONTINUE DEMADEX AND ALDACTONE. 2. ALBUMIN BID FOR 3 DAYS 3. CONTINUE LACTULOSE/XIFAXAN   Subjective: Since last evaluated, the patient HAS EMPTIED OSTOMY BAG 4-5 TIMES. LEGS ARE STILL SWOLLEN.  Objective: Vital signs in last 24 hours: Filed Vitals:   06/26/15 2115 06/27/15 0456  BP: 103/55 109/59  Pulse: 71 73  Temp: 98.5 F (36.9 C) 97.7 F (36.5 C)  Resp: 20    General appearance: alert, cooperative and no distress, makes eye contact Resp: clear to auscultation bilaterally Cardio: regular rate and rhythm GI: soft, non-tender; bowel sounds normal; ostomy in RLQ Extremities: extremities normal: 4+ bilateral lower extermityedema  Lab Results:  K 3.1 Cr 1.72  Studies/Results: No results found.  Medications: I have reviewed the patient's current medications.   LOS: 5 days   Jonette Eva 07/18/2013, 2:23 PM

## 2015-06-27 NOTE — Progress Notes (Signed)
Subjective: Interval History: has no complaint of nausea or vomiting. Patient says that she is feeling still the same. No difficulty breathing but still she states she has significant swelling..  Objective: Vital signs in last 24 hours: Temp:  [97.7 F (36.5 C)-98.5 F (36.9 C)] 97.7 F (36.5 C) (05/20 0456) Pulse Rate:  [67-73] 73 (05/20 0456) Resp:  [18-20] 20 (05/19 2115) BP: (103-109)/(39-59) 109/59 mmHg (05/20 0456) SpO2:  [100 %] 100 % (05/20 0456) Weight:  [91.717 kg (202 lb 3.2 oz)] 91.717 kg (202 lb 3.2 oz) (05/20 0456) Weight change: -0.383 kg (-13.5 oz)  Intake/Output from previous day: 05/19 0701 - 05/20 0700 In: 1060 [P.O.:960; IV Piggyback:100] Out: -  Intake/Output this shift: Total I/O In: 240 [P.O.:240] Out: -   General appearance: alert, cooperative and no distress Resp: diminished breath sounds posterior - bilateral Cardio: regular rate and rhythm GI: abnormal findings:  distended and obese Extremities: edema She has 2-3+ edema  Lab Results:  Recent Labs  06/25/15 0541 06/26/15 0501  WBC 4.6 5.2  HGB 8.3* 8.2*  HCT 24.9* 24.6*  PLT 93* 90*   BMET:  Recent Labs  06/26/15 0501 06/27/15 0642  NA 132* 134*  K 3.7 3.1*  CL 115* 115*  CO2 15* 15*  GLUCOSE 131* 101*  BUN 40* 47*  CREATININE 1.76* 1.72*  CALCIUM 8.4* 8.7*   No results for input(s): PTH in the last 72 hours. Iron Studies: No results for input(s): IRON, TIBC, TRANSFERRIN, FERRITIN in the last 72 hours.  Studies/Results: No results found.  I have reviewed the patient's current medications.  Assessment/Plan: Problem #1 renal failure: Acute on chronic. Presently her BUN and creatinine roughly has returned to her baseline. Patient is asymptomatic. Problem #2 anasarca: Patient presently on Demadex and Aldactone. Her urine output has improved but patient is still with significant sign of fluid overload. Problem #3 hypokalemia: This is due to diuretics. Problem #4 anemia: Her  hemoglobin and hematocrit is low Problem #5 hepatic encephalopathy has improved Problem #6 liver cirrhosis  Problem #7 hyponatremia: Presently her sodium is improving. Plan: 1]We'll start patient on KCl 40 mEq by mouth twice a day 2] we'll increase furosemide to 40 mg by mouth daily 3] we'll continue his Aldactone 4] we'll check renal panel in the morning. 5] we'll check iron studies in the morning.  LOS: 3 days   Beckie Viscardi S 06/27/2015,9:51 AM

## 2015-06-28 LAB — CBC
HCT: 21.9 % — ABNORMAL LOW (ref 36.0–46.0)
Hemoglobin: 7.2 g/dL — ABNORMAL LOW (ref 12.0–15.0)
MCH: 30 pg (ref 26.0–34.0)
MCHC: 32.9 g/dL (ref 30.0–36.0)
MCV: 91.3 fL (ref 78.0–100.0)
PLATELETS: 70 10*3/uL — AB (ref 150–400)
RBC: 2.4 MIL/uL — ABNORMAL LOW (ref 3.87–5.11)
RDW: 18.7 % — AB (ref 11.5–15.5)
WBC: 3.4 10*3/uL — AB (ref 4.0–10.5)

## 2015-06-28 LAB — COMPREHENSIVE METABOLIC PANEL
ALK PHOS: 89 U/L (ref 38–126)
ALT: 14 U/L (ref 14–54)
AST: 19 U/L (ref 15–41)
Albumin: 2.8 g/dL — ABNORMAL LOW (ref 3.5–5.0)
Anion gap: 5 (ref 5–15)
BUN: 47 mg/dL — AB (ref 6–20)
CALCIUM: 8.7 mg/dL — AB (ref 8.9–10.3)
CHLORIDE: 116 mmol/L — AB (ref 101–111)
CO2: 14 mmol/L — AB (ref 22–32)
CREATININE: 1.75 mg/dL — AB (ref 0.44–1.00)
GFR, EST AFRICAN AMERICAN: 36 mL/min — AB (ref >60)
GFR, EST NON AFRICAN AMERICAN: 31 mL/min — AB (ref >60)
Glucose, Bld: 114 mg/dL — ABNORMAL HIGH (ref 65–99)
Potassium: 3.5 mmol/L (ref 3.5–5.1)
Sodium: 135 mmol/L (ref 135–145)
Total Bilirubin: 1 mg/dL (ref 0.3–1.2)
Total Protein: 4.7 g/dL — ABNORMAL LOW (ref 6.5–8.1)

## 2015-06-28 LAB — IRON AND TIBC
Iron: 153 ug/dL (ref 28–170)
Saturation Ratios: 89 % — ABNORMAL HIGH (ref 10.4–31.8)
TIBC: 172 ug/dL — AB (ref 250–450)
UIBC: 19 ug/dL

## 2015-06-28 LAB — URINE CULTURE

## 2015-06-28 LAB — RETICULOCYTES
RBC.: 2.4 MIL/uL — ABNORMAL LOW (ref 3.87–5.11)
RETIC COUNT ABSOLUTE: 124.8 10*3/uL (ref 19.0–186.0)
RETIC CT PCT: 5.2 % — AB (ref 0.4–3.1)

## 2015-06-28 LAB — MAGNESIUM: Magnesium: 1.7 mg/dL (ref 1.7–2.4)

## 2015-06-28 LAB — FOLATE: FOLATE: 23.3 ng/mL (ref >5.9)

## 2015-06-28 LAB — AFP TUMOR MARKER: AFP TUMOR MARKER: 3.5 ng/mL (ref 0.0–8.3)

## 2015-06-28 LAB — GLUCOSE, CAPILLARY: GLUCOSE-CAPILLARY: 104 mg/dL — AB (ref 65–99)

## 2015-06-28 LAB — VITAMIN B12: Vitamin B-12: 753 pg/mL (ref 180–914)

## 2015-06-28 MED ORDER — POTASSIUM CHLORIDE CRYS ER 20 MEQ PO TBCR: 40.0000 meq | EXTENDED_RELEASE_TABLET | Freq: Every day | ORAL | Status: DC

## 2015-06-28 MED ORDER — POTASSIUM CHLORIDE CRYS ER 20 MEQ PO TBCR
40.0000 meq | EXTENDED_RELEASE_TABLET | Freq: Once | ORAL | Status: AC
  Administered 2015-06-28: 40 meq via ORAL
  Filled 2015-06-28: qty 2

## 2015-06-28 MED ORDER — FUROSEMIDE 10 MG/ML IJ SOLN
100.0000 mg | Freq: Two times a day (BID) | INTRAVENOUS | Status: DC
  Administered 2015-06-28 – 2015-06-29 (×4): 100 mg via INTRAVENOUS
  Filled 2015-06-28 (×7): qty 10

## 2015-06-28 MED ORDER — MAGNESIUM SULFATE IN D5W 1-5 GM/100ML-% IV SOLN
1.0000 g | Freq: Once | INTRAVENOUS | Status: DC
  Filled 2015-06-28 (×2): qty 100

## 2015-06-28 MED ORDER — MAGNESIUM SULFATE IN D5W 1-5 GM/100ML-% IV SOLN
1.0000 g | Freq: Once | INTRAVENOUS | Status: AC
  Administered 2015-06-28: 1 g via INTRAVENOUS
  Filled 2015-06-28: qty 100

## 2015-06-28 NOTE — Progress Notes (Addendum)
PROGRESS NOTE                                                                                                                                                                                                             Patient Demographics:    Kathryn [name], is a 60 y.o. female, DOB - Feb 17, 1955, RUE:[id number]  Admit date - 06/24/2015   Admitting Physician Briscoe Deutscher, MD  Outpatient Primary MD for the patient is Nils Pyle, MD  LOS - 4    Chief Complaint  Patient presents with  . Abnormal Lab       Brief Narrative     Kathryn [name] is a 60 y.o. female with medical history significant for successfully-treated hep C, liver cirrhosis, chronic kidney disease stage III, and Crohn's disease status-post multiple surgeries and ileostomy who presents to the ED with several days of generalized weakness, dizziness, and confusion. She was found to Hep encephalopathy, ARF, Severe Hypokalemia, GI and Renal have been called.    Subjective:    Janis Sol today has, No headache, No chest pain, No abdominal pain - No Nausea, No new weakness tingling or numbness, No Cough - SOB.    Assessment  & Plan :      1.Hepatic encephalopathy. In a patient with hep C cirrhosis. Hep C was treated. Have placed her on lactulose 30 g 3 times a day along with Xifaxan, belly is soft, she is afebrile without leukocytosis. Mentation has improved. Primary GI service following. I discussed the case with her GI physician Dr. [name] on 06/26/2015, patient will likely require outpatient reversal of her TIPS procedure.  2. Hep C cirrhosis. Hep C was treated. Supportive care, Nadolol per GI ( BP borderline). Clinically no SBP. She has cirrhosis related mild thrombocytopenia, coagulopathy and hypoalbuminemia. IV albumin ordered by GI for 4 days on 06/26/2015.  3. Hypokalemia. Replaced, will monitor along with BCM, note she had PICC line was placed  for aggressive IV potassium replacement on the day of admission.  4. History of Crohn's disease. Status post ileostomy, supportive care for now GI consulted. Continue mesalamine.  5. Anxiety. On benzodiazepine dose reduced due to encephalopathy.  6. Generalized weakness. Due to #1 above. Increase activity. PT evaluation.  7. ARF on CK D stage III with evidence of both hypotension and third spacing  with edema. Albumin is quite low likely cirrhosis related, GI has placed her on IV albumin 4 days on 06/26/2015, placed on protein supplements along with midodrine, currently gentle hydration, hold IV fluids, renal consulted. No proteinuria. Nephrology following.  8. Anemia of chronic disease. Mild drop noted on 06/28/2015, type screen ordered, anemia panel ordered, will monitor. No signs of bleeding.  9. Hypotension. Due to intravascular dehydration and third spacing of fluid, post gentle hydration . Placed on protein supplementation and midodrine. She is not septic.   10. Chronic Hyperchloremic normal anion gap metabolic acidosis. Likely due to normal saline which he received initially along stool loss from ileostomy, question if she has RTA. We'll defer to nephrology.  11. ? UTI - 100,000 enterococcus growing in urine, will wait for sensitivity, currently no clinical signs of active infection.  12. 5 beat asymptomatic NSVT - last EF 60%, Mag 1gm IV , keep K > 3.9, on low dose B blocker.  Code Status :  Full  Family Communication  : None  Disposition Plan  :  Stay Inpt  Consults  :  Renal, GI  Procedures  :    PICC     DVT Prophylaxis  :    Heparin   Lab Results  Component Value Date   PLT PENDING 06/28/2015    Inpatient Medications  Scheduled Meds: . albumin human  25 g Intravenous Daily  . ALPRAZolam  0.5 mg Oral BID  . cholecalciferol  1,000 Units Oral Daily  . feeding supplement (ENSURE ENLIVE)  237 mL Oral Q24H  . feeding supplement (PRO-STAT SUGAR FREE 64)  30 mL Oral  TID WC  . heparin  5,000 Units Subcutaneous Q8H  . lactulose  30 g Oral TID  . magnesium sulfate 1 - 4 g bolus IVPB  1 g Intravenous Once  . mesalamine  1,000 mg Rectal QHS  . midodrine  5 mg Oral TID WC  . multivitamin with minerals  1 tablet Oral Daily  . nadolol  10 mg Oral Daily  . pantoprazole  40 mg Oral Daily  . potassium chloride  40 mEq Oral BID  . rifaximin  550 mg Oral BID  . sodium chloride flush  3 mL Intravenous Q12H  . spironolactone  50 mg Oral Daily  . torsemide  40 mg Oral Daily   Continuous Infusions:  PRN Meds:.acetaminophen **OR** [DISCONTINUED] acetaminophen, HYDROcodone-acetaminophen, [DISCONTINUED] ondansetron **OR** ondansetron (ZOFRAN) IV  Antibiotics  :    Anti-infectives    Start     Dose/Rate Route Frequency Ordered Stop   06/25/15 1045  rifaximin (XIFAXAN) tablet 550 mg     550 mg Oral 2 times daily 06/25/15 1032           Objective:   Filed Vitals:   06/27/15 0456 06/27/15 1618 06/27/15 2111 06/28/15 0500  BP: 109/59 105/44 106/48 100/57  Pulse: 73 70 69 74  Temp: 97.7 F (36.5 C) 98.3 F (36.8 C) 98.4 F (36.9 C) 98.4 F (36.9 C)  TempSrc: Oral Oral Oral Oral  Resp:  16 16 20   Height:      Weight: 91.717 kg (202 lb 3.2 oz)   90.946 kg (200 lb 8 oz)  SpO2: 100% 100% 99% 98%    Wt Readings from Last 3 Encounters:  06/28/15 90.946 kg (200 lb 8 oz)  06/23/15 92.987 kg (205 lb)  06/19/15 83.915 kg (185 lb)     Intake/Output Summary (Last 24 hours) at 06/28/15 0750 Last data filed at  06/27/15 0932  Gross per 24 hour  Intake    240 ml  Output      0 ml  Net    240 ml     Physical Exam  Awake, Mildly confused oriented 2, No new F.N deficits, Normal affect [place].AT,PERRAL Supple Neck,No JVD, No cervical lymphadenopathy appriciated.  Symmetrical Chest wall movement, Good air movement bilaterally, CTAB RRR,No Gallops,Rubs or new Murmurs, No Parasternal Heave +ve B.Sounds, Abd Soft, No tenderness, No organomegaly appriciated, No  rebound - guarding or rigidity. Ileostomy site stable. No Cyanosis, Clubbing 2 + edema, No new Rash or bruise       Data Review:    CBC  Recent Labs Lab 06/23/15 1711 06/24/15 1623 06/25/15 0541 06/26/15 0501 06/28/15 0643  WBC 4.5 5.6 4.6 5.2 3.4*  HGB 8.5* 8.5* 8.3* 8.2* 7.2*  HCT 26.8* 25.2* 24.9* 24.6* 21.9*  PLT 97* 92* 93* 90* PENDING  MCV 93.7 89.0 89.2 89.5 91.3  MCH 29.7 30.0 29.7 29.8 30.0  MCHC 31.7* 33.7 33.3 33.3 32.9  RDW 17.6* 18.1* 18.2* 18.7* 18.7*  LYMPHSABS 1395  --  1.7  --   --   MONOABS 450  --  0.4  --   --   EOSABS 135  --  0.1  --   --   BASOSABS 0  --  0.0  --   --     Chemistries   Recent Labs Lab 06/23/15 1117 06/23/15 1708  06/24/15 1623 06/25/15 0541 06/25/15 1658 06/25/15 1845 06/26/15 0501 06/27/15 0642 06/28/15 0643  NA 133*  --   < > 129* 132*  --  131* 132* 134* 135  K 2.5*  --   --  2.2* 2.3* >7.5* 3.2* 3.7 3.1* 3.5  CL 103  --   --  110 113*  --  115* 115* 115* 116*  CO2 13*  --   --  14* 15*  --  14* 15* 15* 14*  GLUCOSE 127*  --   < > 122* 168*  --  150* 131* 101* 114*  BUN 38*  --   < > 43* 40*  --  37* 40* 47* 47*  CREATININE 2.11*  --   --  2.06* 1.78*  --  1.87* 1.76* 1.72* 1.75*  CALCIUM 8.9  --   --  8.3* 8.5*  --  8.2* 8.4* 8.7* 8.7*  MG  --   --   --  1.6* 2.1 2.0  --   --   --  1.7  AST 20 21  --  26 24  --   --   --   --  19  ALT 19 16  --  22 20  --   --   --   --  14  ALKPHOS 157* 127  --  119 107  --   --   --   --  89  BILITOT 0.9 1.2  --  1.0 1.0  --   --   --   --  1.0  < > = values in this interval not displayed. ------------------------------------------------------------------------------------------------------------------ No results for input(s): CHOL, HDL, LDLCALC, TRIG, CHOLHDL, LDLDIRECT in the last 72 hours.  Lab Results  Component Value Date   HGBA1C 4.9% 02/13/2014   ------------------------------------------------------------------------------------------------------------------ No  results for input(s): TSH, T4TOTAL, T3FREE, THYROIDAB in the last 72 hours.  Invalid input(s): FREET3 ------------------------------------------------------------------------------------------------------------------ No results for input(s): VITAMINB12, FOLATE, FERRITIN, TIBC, IRON, RETICCTPCT in the last 72 hours.  Coagulation profile  Recent  Labs Lab 06/23/15 1117 06/24/15 1623 06/25/15 1208  INR 1.1 1.50* 1.65*    No results for input(s): DDIMER in the last 72 hours.  Cardiac Enzymes No results for input(s): CKMB, TROPONINI, MYOGLOBIN in the last 168 hours.  Invalid input(s): CK ------------------------------------------------------------------------------------------------------------------    Component Value Date/Time   BNP 74.0 03/26/2015 0345    Micro Results Recent Results (from the past 240 hour(s))  Urine culture     Status: Abnormal (Preliminary result)   Collection Time: 06/25/15  8:15 PM  Result Value Ref Range Status   Specimen Description URINE, RANDOM  Final   Special Requests NONE  Final   Culture >=100,000 COLONIES/mL ENTEROCOCCUS SPECIES (A)  Final   Report Status PENDING  Incomplete  MRSA PCR Screening     Status: None   Collection Time: 06/25/15  8:15 PM  Result Value Ref Range Status   MRSA by PCR NEGATIVE NEGATIVE Final    Comment:        The GeneXpert MRSA Assay (FDA approved for NASAL specimens only), is one component of a comprehensive MRSA colonization surveillance program. It is not intended to diagnose MRSA infection nor to guide or monitor treatment for MRSA infections.     Radiology Reports    Time Spent in minutes  30   Susa Raring K M.D on 06/28/2015 at 7:50 AM  Between 7am to 7pm - Pager - [phone]  After 7pm go to [email or web address] - password [facility]  [facility] -  Office  [phone]

## 2015-06-28 NOTE — Progress Notes (Signed)
Mag IVPB 1g due at 1600. No bag present in the draw. Called pharmacy with no answer. Notified pharmacy twice for missing dose.

## 2015-06-28 NOTE — Progress Notes (Signed)
Patient ID: Kathryn [name], female   DOB: [birth year], 60 y.o.   MRN: [id number]   Assessment/Plan: ADMITTED WITH MENTAL STATUS CHANGES/ANASARCA. CLINICALLY IMPROVED. ANASARCA CONTINUES.  PLAN: 1. CONTINUE ALBUMIN-LAST DOSE 1000 2. CONTINUE LACTULOSE AND XIFAXAN. 3. CONTINUE TO MESALAMINE.   Subjective: Since I last evaluated the patient SHE THINKS THE SWELLING IN HER LEGS IS A BIT BETTER. METAL STATUS CLEAR . NAUSEA YESTERDAY BUT NOT TODAY.  Objective: Vital signs in last 24 hours: Filed Vitals:   06/27/15 2111 06/28/15 0500  BP: 106/48 100/57  Pulse: 69 74  Temp: 98.4 F (36.9 C) 98.4 F (36.9 C)  Resp: 16 20     General appearance: alert, cooperative and no distress Resp: clear to auscultation bilaterally Cardio: regular rate and rhythm GI: soft, non-tender; bowel sounds normal; no masses,  no organomegaly Extremities: edema 3-4+  Lab Results:  K 3.5 Cr 1.75 ALBUMIN 2.8  Studies/Results: No results found.  Medications: I have reviewed the patient's current medications.   LOS: 5 days   Jonette Eva 07/18/2013, 2:23 PM

## 2015-06-28 NOTE — Progress Notes (Signed)
Subjective: Interval History: Patient doesn't have any new complaints. She denies any difficulty breathing. Patient states that her leg swelling didn't change significantly.  Objective: Vital signs in last 24 hours: Temp:  [98.3 F (36.8 C)-98.4 F (36.9 C)] 98.4 F (36.9 C) (05/21 0500) Pulse Rate:  [69-74] 74 (05/21 0500) Resp:  [16-20] 20 (05/21 0500) BP: (100-106)/(44-57) 100/57 mmHg (05/21 0500) SpO2:  [98 %-100 %] 98 % (05/21 0500) Weight:  [90.946 kg (200 lb 8 oz)] 90.946 kg (200 lb 8 oz) (05/21 0500) Weight change: -0.771 kg (-1 lb 11.2 oz)  Intake/Output from previous day: 05/20 0701 - 05/21 0700 In: 240 [P.O.:240] Out: -  Intake/Output this shift:    General appearance: alert, cooperative and no distress Resp: diminished breath sounds posterior - bilateral Cardio: regular rate and rhythm GI: abnormal findings:  distended and obese Extremities: edema She has 2-3+ edema  Lab Results:  Recent Labs  06/26/15 0501 06/28/15 0643  WBC 5.2 3.4*  HGB 8.2* 7.2*  HCT 24.6* 21.9*  PLT 90* 70*   BMET:   Recent Labs  06/27/15 0642 06/28/15 0643  NA 134* 135  K 3.1* 3.5  CL 115* 116*  CO2 15* 14*  GLUCOSE 101* 114*  BUN 47* 47*  CREATININE 1.72* 1.75*  CALCIUM 8.7* 8.7*   No results for input(s): PTH in the last 72 hours. Iron Studies: No results for input(s): IRON, TIBC, TRANSFERRIN, FERRITIN in the last 72 hours.  Studies/Results: No results found.  I have reviewed the patient's current medications.  Assessment/Plan: Problem #1 renal failure: Acute on chronic. Her renal function is stable. Problem #2 anasarca: Patient presently on Demadex /albumin/Aldactone. Patient is still with significant edema. Patient however has been losing about 1 kg every day for the last couple of days. Problem #3 hypokalemia: This is due to diuretics. She is on potassium supplement and her potassium is normal. Problem #4 anemia: Her hemoglobin and hematocrit is low Problem #5  hepatic encephalopathy has improved Problem #6 liver cirrhosis  Problem #7 hyponatremia: Her sodium is normal. Plan: 1]We'll change her potassium to 40 mEq by mouth once a day 2] we'll continue his Aldactone 3] we'll hold Demadex today and give her Lasix 100 mg IV twice a day 4] we'll check renal panel in the morning.   LOS: 4 days   Amarion Portell S 06/28/2015,9:29 AM

## 2015-06-29 LAB — RENAL FUNCTION PANEL
ALBUMIN: 3 g/dL — AB (ref 3.5–5.0)
Anion gap: 5 (ref 5–15)
BUN: 46 mg/dL — ABNORMAL HIGH (ref 6–20)
CALCIUM: 8.6 mg/dL — AB (ref 8.9–10.3)
CO2: 15 mmol/L — AB (ref 22–32)
CREATININE: 1.97 mg/dL — AB (ref 0.44–1.00)
Chloride: 113 mmol/L — ABNORMAL HIGH (ref 101–111)
GFR calc non Af Amer: 27 mL/min — ABNORMAL LOW (ref >60)
GFR, EST AFRICAN AMERICAN: 31 mL/min — AB (ref >60)
GLUCOSE: 112 mg/dL — AB (ref 65–99)
PHOSPHORUS: 3.6 mg/dL (ref 2.5–4.6)
Potassium: 3 mmol/L — ABNORMAL LOW (ref 3.5–5.1)
SODIUM: 133 mmol/L — AB (ref 135–145)

## 2015-06-29 LAB — COMPREHENSIVE METABOLIC PANEL
ALBUMIN: 3 g/dL — AB (ref 3.5–5.0)
ALK PHOS: 107 U/L (ref 38–126)
ALT: 14 U/L (ref 14–54)
AST: 20 U/L (ref 15–41)
Anion gap: 6 (ref 5–15)
BUN: 47 mg/dL — ABNORMAL HIGH (ref 6–20)
CALCIUM: 8.6 mg/dL — AB (ref 8.9–10.3)
CHLORIDE: 114 mmol/L — AB (ref 101–111)
CO2: 14 mmol/L — AB (ref 22–32)
CREATININE: 1.92 mg/dL — AB (ref 0.44–1.00)
GFR calc non Af Amer: 27 mL/min — ABNORMAL LOW (ref >60)
GFR, EST AFRICAN AMERICAN: 32 mL/min — AB (ref >60)
GLUCOSE: 112 mg/dL — AB (ref 65–99)
Potassium: 3 mmol/L — ABNORMAL LOW (ref 3.5–5.1)
SODIUM: 134 mmol/L — AB (ref 135–145)
Total Bilirubin: 1.1 mg/dL (ref 0.3–1.2)
Total Protein: 4.9 g/dL — ABNORMAL LOW (ref 6.5–8.1)

## 2015-06-29 LAB — HEMOGLOBIN AND HEMATOCRIT, BLOOD
HEMATOCRIT: 24.1 % — AB (ref 36.0–46.0)
HEMOGLOBIN: 8 g/dL — AB (ref 12.0–15.0)

## 2015-06-29 LAB — CBC
HCT: 21.7 % — ABNORMAL LOW (ref 36.0–46.0)
HEMOGLOBIN: 7.1 g/dL — AB (ref 12.0–15.0)
MCH: 30 pg (ref 26.0–34.0)
MCHC: 32.7 g/dL (ref 30.0–36.0)
MCV: 91.6 fL (ref 78.0–100.0)
PLATELETS: 68 10*3/uL — AB (ref 150–400)
RBC: 2.37 MIL/uL — AB (ref 3.87–5.11)
RDW: 19 % — ABNORMAL HIGH (ref 11.5–15.5)
WBC: 4.1 10*3/uL (ref 4.0–10.5)

## 2015-06-29 LAB — MAGNESIUM: Magnesium: 1.8 mg/dL (ref 1.7–2.4)

## 2015-06-29 LAB — PREPARE RBC (CROSSMATCH)

## 2015-06-29 LAB — GLUCOSE, CAPILLARY: GLUCOSE-CAPILLARY: 102 mg/dL — AB (ref 65–99)

## 2015-06-29 MED ORDER — DIPHENHYDRAMINE HCL 12.5 MG/5ML PO ELIX: 12.5000 mg | ORAL_SOLUTION | Freq: Three times a day (TID) | ORAL | Status: DC | PRN

## 2015-06-29 MED ORDER — NITROFURANTOIN MONOHYD MACRO 100 MG PO CAPS
100.0000 mg | ORAL_CAPSULE | Freq: Two times a day (BID) | ORAL | Status: DC
  Filled 2015-06-29 (×5): qty 1

## 2015-06-29 MED ORDER — POTASSIUM CHLORIDE CRYS ER 20 MEQ PO TBCR
40.0000 meq | EXTENDED_RELEASE_TABLET | Freq: Three times a day (TID) | ORAL | Status: DC
  Filled 2015-06-29: qty 2

## 2015-06-29 MED ORDER — SODIUM CHLORIDE 0.9 % IV SOLN
Freq: Once | INTRAVENOUS | Status: AC
  Administered 2015-06-29: 14:00:00 via INTRAVENOUS

## 2015-06-29 MED ORDER — MAGNESIUM SULFATE IN D5W 1-5 GM/100ML-% IV SOLN
1.0000 g | Freq: Once | INTRAVENOUS | Status: AC
  Administered 2015-06-29: 1 g via INTRAVENOUS
  Filled 2015-06-29: qty 100

## 2015-06-29 MED ORDER — POTASSIUM CHLORIDE CRYS ER 20 MEQ PO TBCR
40.0000 meq | EXTENDED_RELEASE_TABLET | Freq: Three times a day (TID) | ORAL | Status: DC
  Administered 2015-06-29 (×3): 40 meq via ORAL
  Filled 2015-06-29 (×3): qty 2

## 2015-06-29 MED ORDER — POTASSIUM CHLORIDE CRYS ER 20 MEQ PO TBCR: 40.0000 meq | EXTENDED_RELEASE_TABLET | Freq: Every day | ORAL | Status: DC

## 2015-06-29 MED ORDER — FOSFOMYCIN TROMETHAMINE 3 G PO PACK
3.0000 g | PACK | Freq: Once | ORAL | Status: AC
  Administered 2015-06-29: 3 g via ORAL
  Filled 2015-06-29: qty 3

## 2015-06-29 NOTE — Progress Notes (Signed)
Subjective: Interval History: He is still patient complains of weakness. She didn't have any difficulty breathing. She doesn't have any nausea or vomiting.  Objective: Vital signs in last 24 hours: Temp:  [97.8 F (36.6 C)-98.2 F (36.8 C)] 97.8 F (36.6 C) (05/22 0535) Pulse Rate:  [66-72] 70 (05/22 0535) Resp:  [20] 20 (05/22 0535) BP: (100-112)/(34-55) 100/43 mmHg (05/22 0535) SpO2:  [96 %-100 %] 96 % (05/22 0535) Weight:  [89.721 kg (197 lb 12.8 oz)] 89.721 kg (197 lb 12.8 oz) (05/22 0500) Weight change: -1.225 kg (-2 lb 11.2 oz)  Intake/Output from previous day: 05/21 0701 - 05/22 0700 In: 1320 [P.O.:960; IV Piggyback:360] Out: -  Intake/Output this shift: Total I/O In: -  Out: 200 [Urine:200]  General appearance: alert, cooperative and no distress Resp: diminished breath sounds posterior - bilateral Cardio: regular rate and rhythm GI: abnormal findings:  distended and obese Extremities: edema She has 2-3+ edema  Lab Results:  Recent Labs  06/28/15 0643 06/29/15 0600  WBC 3.4* 4.1  HGB 7.2* 7.1*  HCT 21.9* 21.7*  PLT 70* 68*   BMET:   Recent Labs  06/28/15 0643 06/29/15 0600  NA 135 134*  133*  K 3.5 3.0*  3.0*  CL 116* 114*  113*  CO2 14* 14*  15*  GLUCOSE 114* 112*  112*  BUN 47* 47*  46*  CREATININE 1.75* 1.92*  1.97*  CALCIUM 8.7* 8.6*  8.6*   No results for input(s): PTH in the last 72 hours. Iron Studies:   Recent Labs  06/28/15 0643  IRON 153  TIBC 172*    Studies/Results: No results found.  I have reviewed the patient's current medications.  Assessment/Plan: Problem #1 renal failure: Acute on chronic. Her creatinine is slightly worsening but overall seems to be stable. Problem #2 anasarca: Patient presently on Lasix IV/albumin/Aldactone. She had about 1300 mL of urine output which is better. Presently still she has anasarca.. Problem #3 hypokalemia: This is due to diuretics. She is on potassium supplement and her  potassium is low today. Problem #4 anemia: Her hemoglobin and hematocrit is low Problem #5 hepatic encephalopathy has improved Problem #6 liver cirrhosis  Problem #7 hyponatremia: Her sodium has declined slightly. Plan: 1]We'll change her potassium to 40 mEq by by mouth 3 times a day 2] we'll continue his Aldactone 3] we'll check renal panel in the morning.   LOS: 5 days   Shyvonne Chastang S 06/29/2015,9:15 AM

## 2015-06-29 NOTE — Progress Notes (Signed)
  Subjective:  Patient states her appetite is improving but not normal. She denies nausea. She denies melena or bleeding into ileostomy. She notices blood-tinged mucus over 3 times a week per rectum. She says has never copious in amount. She denies hematuria or vaginal bleeding.   Objective: Blood pressure 100/43, pulse 70, temperature 97.8 F (36.6 C), temperature source Oral, resp. rate 20, height 5\' 5"  (1.651 m), weight 197 lb 12.8 oz (89.721 kg), SpO2 96 %. Patient is alert and more talkative today. She does not have asterixis or tremors. Conjunctiva is pale with small subconjunctival hemorrhage in right eye. Sclera is nonicteric Oropharyngeal mucosa is normal. No neck masses or thyromegaly noted. Cardiac exam with regular rhythm normal S1 and S2. No murmur or gallop noted. Lungs are clear to auscultation. Abdomen is full. Ileostomy bag contains greenish brown liquid stool. Abdomen is soft and nontender. She has 2+ pitting edema involving both legs. Slightly more pronounced in the right leg. Trace edema noted above level of knees.  Labs/studies Results:   Recent Labs  06/28/15 0643 06/29/15 0600  WBC 3.4* 4.1  HGB 7.2* 7.1*  HCT 21.9* 21.7*  PLT 70* 68*    BMET   Recent Labs  06/27/15 0642 06/28/15 0643 06/29/15 0600  NA 134* 135 134*  133*  K 3.1* 3.5 3.0*  3.0*  CL 115* 116* 114*  113*  CO2 15* 14* 14*  15*  GLUCOSE 101* 114* 112*  112*  BUN 47* 47* 47*  46*  CREATININE 1.72* 1.75* 1.92*  1.97*  CALCIUM 8.7* 8.7* 8.6*  8.6*    LFT   Recent Labs  06/27/15 0642 06/28/15 0643 06/29/15 0600  PROT  --  4.7* 4.9*  ALBUMIN 2.6* 2.8* 3.0*  3.0*  AST  --  19 20  ALT  --  14 14  ALKPHOS  --  89 107  BILITOT  --  1.0 1.1    AFP 3.5. Serum iron 153, TIBC 172 and saturation 89% serum ferritin pending. Serum B12 753 and folate 23.3.  Assessment:  #1. Hepatic encephalopathy. Clinically she appears to be improving. She is more interactive today than  she was last Friday. I believe placement of TIPS(October 2015) pushed out into hepatic encephalopathy. Will continue adequate management for now. #2. Decompensated cirrhosis. Patient unfortunately not a candidate for liver transplant on account of inflammatory bowel disease. #3. Anemia. Anemia appears to be secondary to chronic disease. Loss from rectal Crohn's appears to be minimal and would not explain her anemia. No evidence of iron. Further folate deficiency. Hemolysis can occur with TIPS which usually within the first few months. AST is normal. We'll check serum haptoglobin. Patient for PRBC transfusion today. #4. Fluid overload appears to be improving. A significant decrease in LV edema noted. He will be receiving last dose of IV albumin today. #5. Kidney disease. Renal function has improved since admission creatinine has increased in the last 24 hours. #6. Hypokalemia. Patient is on spironolactone and also receiving oral KCl. Being managed by Dr. Fausto Skillern.   Recommendations:  Serum haptoglobin on blood from this morning. Check serum ammonia tomorrow morning. Continue to monitor electrolytes and renal function.

## 2015-06-29 NOTE — Progress Notes (Signed)
PROGRESS NOTE                                                                                                                                                                                                             Patient Demographics:    Kathryn [name] [name], Kathryn [name] a 60 y.o. [name], Kathryn [name] [name], KPV:[id number]  Admit date - 06/24/2015   Admitting Physician Briscoe Deutscher, MD  Outpatient Primary MD for the patient Kathryn [name] Nils Pyle, MD  LOS - 5    Chief Complaint  Patient presents with  . Abnormal Lab       Brief Narrative     Kathryn [name] [name] Kathryn [name] a 60 y.o. [name] with medical history significant for successfully-treated hep C, liver cirrhosis, chronic kidney disease stage III, and Crohn's disease status-post multiple surgeries and ileostomy who presents to the ED with several days of generalized weakness, dizziness, and confusion. She was found to Hep encephalopathy, ARF, Severe Hypokalemia, GI and Renal have been called.    Subjective:    Kathryn [name] [name] today has, No headache, No chest pain, No abdominal pain - No Nausea, No new weakness tingling or numbness, No Cough - SOB.    Assessment  & Plan :      1.Hepatic encephalopathy. In a patient with hep C cirrhosis. Hep C was treated. Have placed her on lactulose 30 g 3 times a day along with Xifaxan, belly Kathryn [name] soft, she Kathryn [name] afebrile without leukocytosis. Mentation has improved. Primary GI service following. I discussed the case with her GI physician Dr. [name] on 06/26/2015, patient will likely require outpatient reversal of her TIPS procedure.  2. Hep C cirrhosis. Hep C was treated. Supportive care, Nadolol per GI ( BP borderline). Clinically no SBP. She has cirrhosis related mild thrombocytopenia, coagulopathy and hypoalbuminemia. IV albumin ordered by GI for 4 days on 06/26/2015.  3. Hypokalemia. Replaced, will monitor along with BCM, note she had PICC line was placed  for aggressive IV potassium replacement on the day of admission.  4. History of Crohn's disease. Status post ileostomy, supportive care for now GI consulted. Continue mesalamine.  5. Anxiety. On benzodiazepine dose reduced due to encephalopathy.  6. Generalized weakness. Due to #1 above. Increase activity. PT evaluation.  7. ARF on CK D stage III with evidence of both hypotension and third spacing  with edema. Albumin Kathryn [name] quite low likely cirrhosis related, GI has placed her on IV albumin , placed on protein supplements along with midodrine, nephrology following and currently on IV Lasix for diuresis still has massive edema and anasarca will apply Unna boots.  8. Anemia of chronic disease. Mild drop noted on 06/28/2015, no signs of bleeding, anemia panel pending, transfuse 1 unit packed RBC on 06/29/2015 and monitor on PPI.  9. Hypotension. Due to intravascular dehydration and third spacing of fluid, post gentle hydration . Placed on protein supplementation and midodrine. She Kathryn [name] not septic.   10. Chronic Hyperchloremic normal anion gap metabolic acidosis. Likely due to normal saline which he received initially along stool loss from ileostomy, question if she has RTA. We'll defer to nephrology.  11. Enterococcus UTI. Treat with nitrofurantoin for 3 days..  12. 5 beat asymptomatic NSVT - last EF 60%, Mag 1gm IV , keep K > 3.9, on low dose B blocker.    Code Status :  Full  Family Communication  : None  Disposition Plan  :  Stay Inpt  Consults  :  Renal, GI  Procedures  :    PICC     DVT Prophylaxis  :    Heparin   Lab Results  Component Value Date   PLT 68* 06/29/2015    Inpatient Medications  Scheduled Meds: . sodium chloride   Intravenous Once  . albumin human  25 g Intravenous Daily  . ALPRAZolam  0.5 mg Oral BID  . cholecalciferol  1,000 Units Oral Daily  . feeding supplement (ENSURE ENLIVE)  237 mL Oral Q24H  . feeding supplement (PRO-STAT SUGAR FREE 64)  30 mL Oral  TID WC  . furosemide  100 mg Intravenous BID  . heparin  5,000 Units Subcutaneous Q8H  . lactulose  30 g Oral TID  . magnesium sulfate 1 - 4 g bolus IVPB  1 g Intravenous Once  . magnesium sulfate 1 - 4 g bolus IVPB  1 g Intravenous Once  . mesalamine  1,000 mg Rectal QHS  . midodrine  5 mg Oral TID WC  . multivitamin with minerals  1 tablet Oral Daily  . nadolol  10 mg Oral Daily  . nitrofurantoin (macrocrystal-monohydrate)  100 mg Oral Q12H  . pantoprazole  40 mg Oral Daily  . potassium chloride  40 mEq Oral TID  . [START ON 06/30/2015] potassium chloride  40 mEq Oral Daily  . rifaximin  550 mg Oral BID  . sodium chloride flush  3 mL Intravenous Q12H  . spironolactone  50 mg Oral Daily   Continuous Infusions:  PRN Meds:.acetaminophen **OR** [DISCONTINUED] acetaminophen, diphenhydrAMINE, HYDROcodone-acetaminophen, [DISCONTINUED] ondansetron **OR** ondansetron (ZOFRAN) IV  Antibiotics  :    Anti-infectives    Start     Dose/Rate Route Frequency Ordered Stop   06/25/15 1045  rifaximin (XIFAXAN) tablet 550 mg     550 mg Oral 2 times daily 06/25/15 1032           Objective:   Filed Vitals:   06/28/15 1457 06/28/15 1945 06/29/15 0500 06/29/15 0535  BP: 112/55 102/34  100/43  Pulse: 66 72  70  Temp: 98.2 F (36.8 C) 98.2 F (36.8 C)  97.8 F (36.6 C)  TempSrc:  Oral  Oral  Resp: Height:      Weight:   89.721 kg (197 lb 12.8 oz)   SpO2: 100% 100%  96%    Wt Readings from Last  3 Encounters:  06/29/15 89.721 kg (197 lb 12.8 oz)  06/23/15 92.987 kg (205 lb)  06/19/15 83.915 kg (185 lb)     Intake/Output Summary (Last 24 hours) at 06/29/15 0818 Last data filed at 06/28/15 1900  Gross per 24 hour  Intake   1320 ml  Output      0 ml  Net   1320 ml     Physical Exam  Awake, Mildly confused oriented 2, No new F.N deficits, Normal affect [place].AT,PERRAL Supple Neck,No JVD, No cervical lymphadenopathy appriciated.  Symmetrical Chest wall movement, Good air  movement bilaterally, CTAB RRR,No Gallops,Rubs or new Murmurs, No Parasternal Heave +ve B.Sounds, Abd Soft, No tenderness, No organomegaly appriciated, No rebound - guarding or rigidity. Ileostomy site stable. No Cyanosis, Clubbing 2 + edema, No new Rash or bruise       Data Review:    CBC  Recent Labs Lab 06/23/15 1711 06/24/15 1623 06/25/15 0541 06/26/15 0501 06/28/15 0643 06/29/15 0600  WBC 4.5 5.6 4.6 5.2 3.4* 4.1  HGB 8.5* 8.5* 8.3* 8.2* 7.2* 7.1*  HCT 26.8* 25.2* 24.9* 24.6* 21.9* 21.7*  PLT 97* 92* 93* 90* 70* 68*  MCV 93.7 89.0 89.2 89.5 91.3 91.6  MCH 29.7 30.0 29.7 29.8 30.0 30.0  MCHC 31.7* 33.7 33.3 33.3 32.9 32.7  RDW 17.6* 18.1* 18.2* 18.7* 18.7* 19.0*  LYMPHSABS 1395  --  1.7  --   --   --   MONOABS 450  --  0.4  --   --   --   EOSABS 135  --  0.1  --   --   --   BASOSABS 0  --  0.0  --   --   --     Chemistries   Recent Labs Lab 06/23/15 1708  06/24/15 1623 06/25/15 0541 06/25/15 1658 06/25/15 1845 06/26/15 0501 06/27/15 0642 06/28/15 0643 06/29/15 0600  NA  --   < > 129* 132*  --  131* 132* 134* 135 134*  133*  K  --   --  2.2* 2.3* >7.5* 3.2* 3.7 3.1* 3.5 3.0*  3.0*  CL  --   --  110 113*  --  115* 115* 115* 116* 114*  113*  CO2  --   --  14* 15*  --  14* 15* 15* 14* 14*  15*  GLUCOSE  --   < > 122* 168*  --  150* 131* 101* 114* 112*  112*  BUN  --   < > 43* 40*  --  37* 40* 47* 47* 47*  46*  CREATININE  --   --  2.06* 1.78*  --  1.87* 1.76* 1.72* 1.75* 1.92*  1.97*  CALCIUM  --   --  8.3* 8.5*  --  8.2* 8.4* 8.7* 8.7* 8.6*  8.6*  MG  --   --  1.6* 2.1 2.0  --   --   --  1.7 1.8  AST 21  --  26 24  --   --   --   --  19 20  ALT 16  --  22 20  --   --   --   --  14 14  ALKPHOS 127  --  119 107  --   --   --   --  89 107  BILITOT 1.2  --  1.0 1.0  --   --   --   --  1.0 1.1  < > = values in this interval  not  displayed. ------------------------------------------------------------------------------------------------------------------ No results for input(s): CHOL, HDL, LDLCALC, TRIG, CHOLHDL, LDLDIRECT in the last 72 hours.  Lab Results  Component Value Date   HGBA1C 4.9% 02/13/2014   ------------------------------------------------------------------------------------------------------------------ No results for input(s): TSH, T4TOTAL, T3FREE, THYROIDAB in the last 72 hours.  Invalid input(s): FREET3 ------------------------------------------------------------------------------------------------------------------  Recent Labs  06/28/15 0643 06/28/15 0819  VITAMINB12  --  753  FOLATE  --  23.3  TIBC 172*  --   IRON 153  --   RETICCTPCT  --  5.2*    Coagulation profile  Recent Labs Lab 06/23/15 1117 06/24/15 1623 06/25/15 1208  INR 1.1 1.50* 1.65*    No results for input(s): DDIMER in the last 72 hours.  Cardiac Enzymes No results for input(s): CKMB, TROPONINI, MYOGLOBIN in the last 168 hours.  Invalid input(s): CK ------------------------------------------------------------------------------------------------------------------    Component Value Date/Time   BNP 74.0 03/26/2015 0345    Micro Results Recent Results (from the past 240 hour(s))  Urine culture     Status: Abnormal   Collection Time: 06/25/15  8:15 PM  Result Value Ref Range Status   Specimen Description URINE, RANDOM  Final   Special Requests NONE  Final   Culture >=100,000 COLONIES/mL ENTEROCOCCUS SPECIES (A)  Final   Report Status 06/28/2015 FINAL  Final   Organism ID, Bacteria ENTEROCOCCUS SPECIES (A)  Final      Susceptibility   Enterococcus species - MIC*    AMPICILLIN <=2 SENSITIVE Sensitive     LEVOFLOXACIN >=8 RESISTANT Resistant     NITROFURANTOIN <=16 SENSITIVE Sensitive     VANCOMYCIN 1 SENSITIVE Sensitive     * >=100,000 COLONIES/mL ENTEROCOCCUS SPECIES  MRSA PCR Screening     Status:  None   Collection Time: 06/25/15  8:15 PM  Result Value Ref Range Status   MRSA by PCR NEGATIVE NEGATIVE Final    Comment:        The GeneXpert MRSA Assay (FDA approved for NASAL specimens only), Kathryn [name] one component of a comprehensive MRSA colonization surveillance program. It Kathryn [name] not intended to diagnose MRSA infection nor to guide or monitor treatment for MRSA infections.     Radiology Reports    Time Spent in minutes  30   Susa Raring K M.D on 06/29/2015 at 8:18 AM  Between 7am to 7pm - Pager - [phone]  After 7pm go to [email or web address] - password [facility]  [facility] -  Office  [phone]

## 2015-06-29 NOTE — Care Management Important Message (Signed)
Important Message  Patient Details  Name: Kathryn [name] MRN: [id number] Date of Birth: Nov 28, 1955   Medicare Important Message Given:  Yes    Zada Haser, Chrystine Oiler, RN 06/29/2015, 9:13 AM

## 2015-06-30 ENCOUNTER — Ambulatory Visit: Payer: Medicare HMO | Admitting: Family

## 2015-06-30 LAB — MAGNESIUM: Magnesium: 1.8 mg/dL (ref 1.7–2.4)

## 2015-06-30 LAB — RENAL FUNCTION PANEL
ALBUMIN: 3 g/dL — AB (ref 3.5–5.0)
ANION GAP: 5 (ref 5–15)
BUN: 48 mg/dL — AB (ref 6–20)
CALCIUM: 8.7 mg/dL — AB (ref 8.9–10.3)
CO2: 16 mmol/L — AB (ref 22–32)
CREATININE: 1.83 mg/dL — AB (ref 0.44–1.00)
Chloride: 114 mmol/L — ABNORMAL HIGH (ref 101–111)
GFR calc Af Amer: 34 mL/min — ABNORMAL LOW (ref >60)
GFR calc non Af Amer: 29 mL/min — ABNORMAL LOW (ref >60)
GLUCOSE: 102 mg/dL — AB (ref 65–99)
PHOSPHORUS: 3.6 mg/dL (ref 2.5–4.6)
Potassium: 3.6 mmol/L (ref 3.5–5.1)
SODIUM: 135 mmol/L (ref 135–145)

## 2015-06-30 LAB — COMPREHENSIVE METABOLIC PANEL
ALBUMIN: 3.1 g/dL — AB (ref 3.5–5.0)
ALT: 13 U/L — AB (ref 14–54)
ANION GAP: 4 — AB (ref 5–15)
AST: 19 U/L (ref 15–41)
Alkaline Phosphatase: 101 U/L (ref 38–126)
BUN: 49 mg/dL — ABNORMAL HIGH (ref 6–20)
CHLORIDE: 115 mmol/L — AB (ref 101–111)
CO2: 16 mmol/L — AB (ref 22–32)
CREATININE: 1.77 mg/dL — AB (ref 0.44–1.00)
Calcium: 8.8 mg/dL — ABNORMAL LOW (ref 8.9–10.3)
GFR calc non Af Amer: 30 mL/min — ABNORMAL LOW (ref >60)
GFR, EST AFRICAN AMERICAN: 35 mL/min — AB (ref >60)
Glucose, Bld: 101 mg/dL — ABNORMAL HIGH (ref 65–99)
Potassium: 3.6 mmol/L (ref 3.5–5.1)
SODIUM: 135 mmol/L (ref 135–145)
Total Bilirubin: 1.1 mg/dL (ref 0.3–1.2)
Total Protein: 4.8 g/dL — ABNORMAL LOW (ref 6.5–8.1)

## 2015-06-30 LAB — CBC
HEMATOCRIT: 23.4 % — AB (ref 36.0–46.0)
Hemoglobin: 7.6 g/dL — ABNORMAL LOW (ref 12.0–15.0)
MCH: 30.2 pg (ref 26.0–34.0)
MCHC: 32.5 g/dL (ref 30.0–36.0)
MCV: 92.9 fL (ref 78.0–100.0)
Platelets: 56 10*3/uL — ABNORMAL LOW (ref 150–400)
RBC: 2.52 MIL/uL — AB (ref 3.87–5.11)
RDW: 18.2 % — ABNORMAL HIGH (ref 11.5–15.5)
WBC: 3.1 10*3/uL — AB (ref 4.0–10.5)

## 2015-06-30 LAB — HAPTOGLOBIN

## 2015-06-30 LAB — GLUCOSE, CAPILLARY: Glucose-Capillary: 100 mg/dL — ABNORMAL HIGH (ref 65–99)

## 2015-06-30 LAB — AMMONIA: Ammonia: 56 umol/L — ABNORMAL HIGH (ref 9–35)

## 2015-06-30 MED ORDER — POTASSIUM CHLORIDE CRYS ER 20 MEQ PO TBCR
40.0000 meq | EXTENDED_RELEASE_TABLET | Freq: Every day | ORAL | Status: DC
  Administered 2015-06-30 – 2015-07-02 (×3): 40 meq via ORAL
  Filled 2015-06-30 (×3): qty 2

## 2015-06-30 MED ORDER — TORSEMIDE 20 MG PO TABS
60.0000 mg | ORAL_TABLET | Freq: Every day | ORAL | Status: DC
  Administered 2015-06-30 – 2015-07-02 (×3): 60 mg via ORAL
  Filled 2015-06-30 (×3): qty 3

## 2015-06-30 NOTE — Care Management Note (Signed)
Case Management Note  Patient Details  Name: KRYSTALE BIONDI MRN: [id number] Date of Birth: [birth year]  Subjective/Objective:                    Action/Plan: case discussed at [facility] with staff and Medical Director.  Referral for HRI placed with Abby Hilton at [facility].    Expected Discharge Date:                  Expected Discharge Plan:  Home w Home Health Services  In-House Referral:  NA  Discharge planning Services  CM Consult  Post Acute Care Choice:  Home Health Choice offered to:  Patient  DME Arranged:    DME Agency:     HH Arranged:    HH Agency:  [facility]  Status of Service:  Completed, signed off  Medicare Important Message Given:  Yes Date Medicare IM Given:    Medicare IM give by:    Date Additional Medicare IM Given:    Additional Medicare Important Message give by:     If discussed at Long Length of Stay Meetings, dates discussed:  06/30/2015 Additional Comments: HRI  Adonis Huguenin, RN 06/30/2015, 11:02 AM

## 2015-06-30 NOTE — Progress Notes (Signed)
  Subjective:  Patient has no complaints. She states she is eating at least 50% of each meal. She states she does not have a good appetite but denies nausea vomiting abdominal pain. She feels weak and died but denies feeling lightheaded or dizzy. She denies bleeding or tarry stools.   Objective: Blood pressure 97/48, pulse 64, temperature 98.4 F (36.9 C), temperature source Oral, resp. rate 20, height 5\' 5"  (1.651 m), weight 197 lb 4.8 oz (89.495 kg), SpO2 100 %. Patient is alert and does not have asterixis. Abdomen. Abdomen is full with ileostomy in right low quadrant. Ileostomy bag is thick light brown stool. Abdomen is soft and nontender. 2+ pitting edema involving both legs worse on the right than on the left. Multiple ecchymosis noted over both forearms.  Labs/studies Results:   Recent Labs  06/28/15 0643 06/29/15 0600 06/29/15 2018 06/30/15 0642  WBC 3.4* 4.1  --  3.1*  HGB 7.2* 7.1* 8.0* 7.6*  HCT 21.9* 21.7* 24.1* 23.4*  PLT 70* 68*  --  56*    BMET   Recent Labs  06/28/15 0643 06/29/15 0600 06/30/15 0642  NA 135 134*  133* 135  135  K 3.5 3.0*  3.0* 3.6  3.6  CL 116* 114*  113* 115*  114*  CO2 14* 14*  15* 16*  16*  GLUCOSE 114* 112*  112* 101*  102*  BUN 47* 47*  46* 49*  48*  CREATININE 1.75* 1.92*  1.97* 1.77*  1.83*  CALCIUM 8.7* 8.6*  8.6* 8.8*  8.7*    LFT   Recent Labs  06/28/15 0643 06/29/15 0600 06/30/15 0642  PROT 4.7* 4.9* 4.8*  ALBUMIN 2.8* 3.0*  3.0* 3.1*  3.0*  AST 19 20 19   ALT 14 14 13*  ALKPHOS 89 107 101  BILITOT 1.0 1.1 1.1    Serum ammonia is 56. Haptoglobin is less than 10  Assessment:  #1. Hepatic encephalopathy. She has responded to combination of lactulose and Xifaxan which may indicate small intestinal bacterial overgrowth. Continue with medical therapy as long it is working. #2. Cirrhosis. Unfortunately patient not a candidate for liver transplant on account of inflammatory bowel disease. Since  patient has functioning TIPS she does not need mental all. #3. Anemia. Serum haptoglobin is less than 10 suggestive of hemolysis. Will check LDH support this diagnosis. Modest rise in hemoglobin after 1 unit of PRBC transfusion. #4. Kidney disease. Renal function is improving. She may be close to her baseline.  Recommendations:  DC Nadolol. Check serum LDH.

## 2015-06-30 NOTE — Evaluation (Signed)
Physical Therapy Re- Evaluation Patient Details Name: Kathryn [name] MRN: [id number] DOB: 10/03/1955 Today's Date: 06/30/2015   History of Present Illness  60 yo F admitted with LE edema, hepatic encephalopathy, and severe hypokalemia.  Low H&H at 7.6/23.4 s/p blood transfusion 5/22.  PMH: Hepatitis C, Crohn's disease stage III, ileostomy, anemia, renal insufficiency, splenomegaly, chronic back pain, thrombocytopenia, TIPS, abdominal surgery.   Clinical Impression  Pt received in bed, and was agreeable to PT re-evaluation.  At baseline, but is independent with ambulation, and receives assistance from her husband for dressing and bathing.  During PT evaluation today she demonstrated all functional mobility at modified independent level or independent level including bed mobility, transfers, and gait.  Pt ambulated 449ft with no DME, and no overt gait deviations or LOB.  She denied any dizziness or LOB during ambulation.  Vitals during PT re-eval were as follows:  Supine: BP 105/33, SpO2: 100% on RA, and HR: 77bpm After ambulation: BP 87/60, SpO2: 98% on RA, and HR: 76bpm After return to supine: BP 104/49, SpO2: 98% on RA, and HR: 71bpm Pt continues to demonstrate all functional mobility at modified independent or independent level, which is her baseline.  Therefore, pt does not demonstrate need for skilled PT at this time.  Encouraged pt to continue mobilizing with nursing staff when able.      Follow Up Recommendations No PT follow up    Equipment Recommendations  None recommended by PT    Recommendations for Other Services       Precautions / Restrictions Restrictions Weight Bearing Restrictions: No      Mobility  Bed Mobility Overal bed mobility: Modified Independent                Transfers Overall transfer level: Modified independent                  Ambulation/Gait Ambulation/Gait assistance: Independent Ambulation Distance (Feet): 400 Feet Assistive device:  None Gait Pattern/deviations: WFL(Within Functional Limits)     General Gait Details: No overt gait deviations or LOB during ambulation today.  Pt denied any light headedness or dizzines throughout ambulation.   Stairs            Wheelchair Mobility    Modified Rankin (Stroke Patients Only)       Balance Overall balance assessment: No apparent balance deficits (not formally assessed)                                           Pertinent Vitals/Pain Pain Assessment: No/denies pain    Home Living Family/patient expects to be discharged to:: Private residence Living Arrangements: Spouse/significant other Available Help at Discharge: Family (Lives with husband) Type of Home: House Home Access: Stairs to enter Entrance Stairs-Rails: Doctor, general practice of Steps: 2 Home Layout: One level Home Equipment: None      Prior Function Level of Independence: Needs assistance   Gait / Transfers Assistance Needed: Pt ambulates indpendently  ADL's / Homemaking Assistance Needed: Pt states that she has required asistance for dressing and bathing from her husband for the last 2-3 weeks because she has been having difficulty lifting her LE's.         Hand Dominance        Extremity/Trunk Assessment   Upper Extremity Assessment: Overall WFL for tasks assessed  Lower Extremity Assessment: Overall WFL for tasks assessed         Communication   Communication: No difficulties  Cognition Arousal/Alertness: Awake/alert Behavior During Therapy: WFL for tasks assessed/performed Overall Cognitive Status: Within Functional Limits for tasks assessed                      General Comments      Exercises        Assessment/Plan    PT Assessment Patent does not need any further PT services  PT Diagnosis Generalized weakness   PT Problem List    PT Treatment Interventions     PT Goals (Current goals can be found in the  Care Plan section) Acute Rehab PT Goals Patient Stated Goal: Pt wants to go home.  PT Goal Formulation: With patient Time For Goal Achievement: 07/03/15 Potential to Achieve Goals: Good    Frequency     Barriers to discharge        Co-evaluation               End of Session Equipment Utilized During Treatment: Gait belt Activity Tolerance: Patient tolerated treatment well Patient left: in bed;with call bell/phone within reach Nurse Communication: Mobility status    Functional Assessment Tool Used: [employer] "6-clicks"  Functional Limitation: Mobility: Walking and moving around Mobility: Walking and Moving Around Current Status [phone]): At least 1 percent but less than 20 percent impaired, limited or restricted Mobility: Walking and Moving Around Goal Status [phone]): At least 1 percent but less than 20 percent impaired, limited or restricted Mobility: Walking and Moving Around Discharge Status [phone]): At least 1 percent but less than 20 percent impaired, limited or restricted    Time: 1050-1114 PT Time Calculation (min) (ACUTE ONLY): 24 min   Charges:   PT Evaluation $PT Re-evaluation: 1 Procedure PT Treatments $Therapeutic Activity: 8-22 mins   PT G Codes:   PT G-Codes **NOT FOR INPATIENT CLASS** Functional Assessment Tool Used: [employer] "6-clicks"  Functional Limitation: Mobility: Walking and moving around Mobility: Walking and Moving Around Current Status [phone]): At least 1 percent but less than 20 percent impaired, limited or restricted Mobility: Walking and Moving Around Goal Status [phone]): At least 1 percent but less than 20 percent impaired, limited or restricted Mobility: Walking and Moving Around Discharge Status [phone]): At least 1 percent but less than 20 percent impaired, limited or restricted    Waynetta Sandy Frady Taddeo, PT, DPT X: [phone]   06/30/2015, 11:27 AM

## 2015-06-30 NOTE — Progress Notes (Signed)
PROGRESS NOTE                                                                                                                                                                                                             Patient Demographics:    Kathryn [name], is a 60 y.o. female, DOB - 1955/11/15, WRU:[id number]  Admit date - 06/24/2015   Admitting Physician Briscoe Deutscher, MD  Outpatient Primary MD for the patient is Nils Pyle, MD  LOS - 6    Chief Complaint  Patient presents with  . Abnormal Lab       Brief Narrative     Kathryn [name] is a 60 y.o. female with medical history significant for successfully-treated hep C, liver cirrhosis, chronic kidney disease stage III, and Crohn's disease status-post multiple surgeries and ileostomy who presents to the ED with several days of generalized weakness, dizziness, and confusion. She was found to Hep encephalopathy, ARF, Severe Hypokalemia, GI and Renal have been called.  He has evidence of massive edema needs continued diuresis under the guidance of nephrology.    Subjective:    Kathryn [name] today has, No headache, No chest pain, No abdominal pain - No Nausea, No new weakness tingling or numbness, No Cough - SOB.    Assessment  & Plan :      1.Hepatic encephalopathy. In a patient with hep C cirrhosis. Hep C was treated. Have placed her on lactulose 30 g 3 times a day along with Xifaxan, belly is soft, she is afebrile without leukocytosis. Mentation has improved. Primary GI service following. I discussed the case with her GI physician Dr. [name] on 06/26/2015, patient will likely require outpatient reversal of her TIPS procedure. Today's ammonia level pending. Clinically she is much better.  2. Hep C cirrhosis. Hep C was treated. Supportive care, Nadolol per GI ( BP borderline). Clinically no SBP. She has cirrhosis related mild thrombocytopenia, coagulopathy and  hypoalbuminemia. IV albumin ordered by GI for 4 days on 06/26/2015.  3. Hypokalemia. Replaced, will monitor along with BCM, note she had PICC line was placed for aggressive IV potassium replacement on the day of admission.  4. History of Crohn's disease. Status post ileostomy, supportive care for now GI consulted. Continue mesalamine.  5. Anxiety. On benzodiazepine dose reduced due to encephalopathy.  6. Generalized weakness.  Due to #1 above. Increase activity. PT evaluation.  7. ARF on CK D stage III with evidence of both hypotension and third spacing with edema. Albumin is quite low likely cirrhosis related, GI has placed her on IV albumin , placed on protein supplements along with midodrine, nephrology following and currently on IV Lasix and Aldactone for diuresis still has massive edema and anasarca, have ordered Unna boots. Defer diuresis to nephrology.  8. Anemia of chronic disease. Mild drop noted on 06/28/2015, no signs of bleeding, heptoglobin negative, anemia panel inconclusive, transfused 1 unit packed RBC on 06/29/2015 , stable H&H, continue on PPI.  9. Hypotension. Due to intravascular dehydration and third spacing of fluid, post gentle hydration . Placed on protein supplementation and midodrine. She is not septic.   10. Chronic Hyperchloremic normal anion gap metabolic acidosis. Likely due to normal saline which he received initially along stool loss from ileostomy, question if she has RTA. We'll defer to nephrology.  11. Enterococcus UTI. Treated with single dose fosfomycin  12. 5 beat asymptomatic NSVT - last EF 60%, Mag 1gm IV , keep K > 3.9, on low dose B blocker.    Code Status :  Full  Family Communication  : None  Disposition Plan  :  Stay Inpt  Consults  :  Renal, GI  Procedures  :    PICC     1 unit packed RBC transfusion on 06/29/2015  DVT Prophylaxis  :    Heparin   Lab Results  Component Value Date   PLT 68* 06/29/2015    Inpatient  Medications  Scheduled Meds: . ALPRAZolam  0.5 mg Oral BID  . cholecalciferol  1,000 Units Oral Daily  . feeding supplement (ENSURE ENLIVE)  237 mL Oral Q24H  . feeding supplement (PRO-STAT SUGAR FREE 64)  30 mL Oral TID WC  . furosemide  100 mg Intravenous BID  . heparin  5,000 Units Subcutaneous Q8H  . lactulose  30 g Oral TID  . magnesium sulfate 1 - 4 g bolus IVPB  1 g Intravenous Once  . mesalamine  1,000 mg Rectal QHS  . midodrine  5 mg Oral TID WC  . multivitamin with minerals  1 tablet Oral Daily  . nadolol  10 mg Oral Daily  . pantoprazole  40 mg Oral Daily  . potassium chloride  40 mEq Oral TID  . rifaximin  550 mg Oral BID  . sodium chloride flush  3 mL Intravenous Q12H  . spironolactone  50 mg Oral Daily   Continuous Infusions:  PRN Meds:.acetaminophen **OR** [DISCONTINUED] acetaminophen, diphenhydrAMINE, HYDROcodone-acetaminophen, [DISCONTINUED] ondansetron **OR** ondansetron (ZOFRAN) IV  Antibiotics  :    Anti-infectives    Start     Dose/Rate Route Frequency Ordered Stop   06/25/15 1045  rifaximin (XIFAXAN) tablet 550 mg     550 mg Oral 2 times daily 06/25/15 1032           Objective:   Filed Vitals:   06/29/15 1947 06/29/15 2124 06/30/15 0532 06/30/15 0637  BP: 103/52 104/48 90/32   Pulse: 71 72 75   Temp: 98 F (36.7 C) 98.5 F (36.9 C) 98.5 F (36.9 C)   TempSrc: Oral Oral Oral   Resp: 20 18 18    Height:      Weight:    89.495 kg (197 lb 4.8 oz)  SpO2: 99% 100% 98%     Wt Readings from Last 3 Encounters:  06/30/15 89.495 kg (197 lb 4.8 oz)  06/23/15  92.987 kg (205 lb)  06/19/15 83.915 kg (185 lb)     Intake/Output Summary (Last 24 hours) at 06/30/15 0717 Last data filed at 06/29/15 1947  Gross per 24 hour  Intake   1140 ml  Output   1200 ml  Net    -60 ml     Physical Exam  Awake, Mildly confused oriented 2, No new F.N deficits, Normal affect [place].AT,PERRAL Supple Neck,No JVD, No cervical lymphadenopathy appriciated.   Symmetrical Chest wall movement, Good air movement bilaterally, CTAB RRR,No Gallops,Rubs or new Murmurs, No Parasternal Heave +ve B.Sounds, Abd Soft, No tenderness, No organomegaly appriciated, No rebound - guarding or rigidity. Ileostomy site stable. No Cyanosis, Clubbing 2 + edema, No new Rash or bruise       Data Review:    CBC  Recent Labs Lab 06/23/15 1711 06/24/15 1623 06/25/15 0541 06/26/15 0501 06/28/15 0643 06/29/15 0600 06/29/15 2018  WBC 4.5 5.6 4.6 5.2 3.4* 4.1  --   HGB 8.5* 8.5* 8.3* 8.2* 7.2* 7.1* 8.0*  HCT 26.8* 25.2* 24.9* 24.6* 21.9* 21.7* 24.1*  PLT 97* 92* 93* 90* 70* 68*  --   MCV 93.7 89.0 89.2 89.5 91.3 91.6  --   MCH 29.7 30.0 29.7 29.8 30.0 30.0  --   MCHC 31.7* 33.7 33.3 33.3 32.9 32.7  --   RDW 17.6* 18.1* 18.2* 18.7* 18.7* 19.0*  --   LYMPHSABS 1395  --  1.7  --   --   --   --   MONOABS 450  --  0.4  --   --   --   --   EOSABS 135  --  0.1  --   --   --   --   BASOSABS 0  --  0.0  --   --   --   --     Chemistries   Recent Labs Lab 06/23/15 1708  06/24/15 1623 06/25/15 0541 06/25/15 1658 06/25/15 1845 06/26/15 0501 06/27/15 0642 06/28/15 0643 06/29/15 0600  NA  --   < > 129* 132*  --  131* 132* 134* 135 134*  133*  K  --   --  2.2* 2.3* >7.5* 3.2* 3.7 3.1* 3.5 3.0*  3.0*  CL  --   --  110 113*  --  115* 115* 115* 116* 114*  113*  CO2  --   --  14* 15*  --  14* 15* 15* 14* 14*  15*  GLUCOSE  --   < > 122* 168*  --  150* 131* 101* 114* 112*  112*  BUN  --   < > 43* 40*  --  37* 40* 47* 47* 47*  46*  CREATININE  --   --  2.06* 1.78*  --  1.87* 1.76* 1.72* 1.75* 1.92*  1.97*  CALCIUM  --   --  8.3* 8.5*  --  8.2* 8.4* 8.7* 8.7* 8.6*  8.6*  MG  --   --  1.6* 2.1 2.0  --   --   --  1.7 1.8  AST 21  --  26 24  --   --   --   --  19 20  ALT 16  --  22 20  --   --   --   --  14 14  ALKPHOS 127  --  119 107  --   --   --   --  89 107  BILITOT 1.2  --  1.0 1.0  --   --   --   --  1.0 1.1  < > = values in this interval not  displayed. ------------------------------------------------------------------------------------------------------------------ No results for input(s): CHOL, HDL, LDLCALC, TRIG, CHOLHDL, LDLDIRECT in the last 72 hours.  Lab Results  Component Value Date   HGBA1C 4.9% 02/13/2014   ------------------------------------------------------------------------------------------------------------------ No results for input(s): TSH, T4TOTAL, T3FREE, THYROIDAB in the last 72 hours.  Invalid input(s): FREET3 ------------------------------------------------------------------------------------------------------------------  Recent Labs  06/28/15 0643 06/28/15 0819  VITAMINB12  --  753  FOLATE  --  23.3  TIBC 172*  --   IRON 153  --   RETICCTPCT  --  5.2*    Coagulation profile  Recent Labs Lab 06/23/15 1117 06/24/15 1623 06/25/15 1208  INR 1.1 1.50* 1.65*    No results for input(s): DDIMER in the last 72 hours.  Cardiac Enzymes No results for input(s): CKMB, TROPONINI, MYOGLOBIN in the last 168 hours.  Invalid input(s): CK ------------------------------------------------------------------------------------------------------------------    Component Value Date/Time   BNP 74.0 03/26/2015 0345    Micro Results Recent Results (from the past 240 hour(s))  Urine culture     Status: Abnormal   Collection Time: 06/25/15  8:15 PM  Result Value Ref Range Status   Specimen Description URINE, RANDOM  Final   Special Requests NONE  Final   Culture >=100,000 COLONIES/mL ENTEROCOCCUS SPECIES (A)  Final   Report Status 06/28/2015 FINAL  Final   Organism ID, Bacteria ENTEROCOCCUS SPECIES (A)  Final      Susceptibility   Enterococcus species - MIC*    AMPICILLIN <=2 SENSITIVE Sensitive     LEVOFLOXACIN >=8 RESISTANT Resistant     NITROFURANTOIN <=16 SENSITIVE Sensitive     VANCOMYCIN 1 SENSITIVE Sensitive     * >=100,000 COLONIES/mL ENTEROCOCCUS SPECIES  MRSA PCR Screening     Status:  None   Collection Time: 06/25/15  8:15 PM  Result Value Ref Range Status   MRSA by PCR NEGATIVE NEGATIVE Final    Comment:        The GeneXpert MRSA Assay (FDA approved for NASAL specimens only), is one component of a comprehensive MRSA colonization surveillance program. It is not intended to diagnose MRSA infection nor to guide or monitor treatment for MRSA infections.     Radiology Reports    Time Spent in minutes  30   Susa Raring K M.D on 06/30/2015 at 7:17 AM  Between 7am to 7pm - Pager - [phone]  After 7pm go to [email or web address] - password [facility]  [facility] -  Office  [phone]

## 2015-06-30 NOTE — Progress Notes (Signed)
Subjective: Interval History: Patient claims to his morbid with the same. She claims her leg swelling has gone down. She denies any difficulty in breathing.  Objective: Vital signs in last 24 hours: Temp:  [97.9 F (36.6 C)-98.5 F (36.9 C)] 98.5 F (36.9 C) (05/23 0532) Pulse Rate:  [66-75] 75 (05/23 0532) Resp:  [18-20] 18 (05/23 0532) BP: (81-104)/(32-53) 90/32 mmHg (05/23 0532) SpO2:  [98 %-100 %] 98 % (05/23 0532) Weight:  [89.495 kg (197 lb 4.8 oz)] 89.495 kg (197 lb 4.8 oz) (05/23 8546) Weight change: -0.227 kg (-8 oz)  Intake/Output from previous day: 05/22 0701 - 05/23 0700 In: 1140 [P.O.:480; I.V.:250; Blood:350; IV Piggyback:60] Out: 1200 [Urine:1200] Intake/Output this shift:    Generally patient is alert and in no apparent distress Chest decreased breath sound bilaterally, no rales rhonchi or wheezing Heart exam regular rate and rhythm no murmur Abdomen: Full, nontender and positive bowel sounds Extremities: 1-2+ edema bilaterally  Lab Results:  Recent Labs  06/29/15 0600 06/29/15 2018 06/30/15 0642  WBC 4.1  --  3.1*  HGB 7.1* 8.0* 7.6*  HCT 21.7* 24.1* 23.4*  PLT 68*  --  56*   BMET:   Recent Labs  06/29/15 0600 06/30/15 0642  NA 134*  133* 135  135  K 3.0*  3.0* 3.6  3.6  CL 114*  113* 115*  114*  CO2 14*  15* 16*  16*  GLUCOSE 112*  112* 101*  102*  BUN 47*  46* 49*  48*  CREATININE 1.92*  1.97* 1.77*  1.83*  CALCIUM 8.6*  8.6* 8.8*  8.7*   No results for input(s): PTH in the last 72 hours. Iron Studies:   Recent Labs  06/28/15 0643  IRON 153  TIBC 172*    Studies/Results: No results found.  I have reviewed the patient's current medications.  Assessment/Plan: Problem #1 renal failure: Acute on chronic. Her BUN and creatinine is slightly better. Possibly her baseline. Problem #2 anasarca: Patient presently on Lasix IV/albumin/Aldactone. She had about 1200 mL of urine output which is better. Her weight has gone  down to 197. Presently seems to be improving. Problem #3 hypokalemia: This is due to diuretics.   her potassium has corrected. Problem #4 anemia: Her hemoglobin and hematocrit is low Problem #5 hepatic encephalopathy has improved Problem #6 liver cirrhosis  Problem #7 hyponatremia: Her sodium sodium has normalized. Plan: 1]We'll change her potassium to 40 mEq by by mouth once a day  2] we'll continue his Aldactone 3] we'll check renal panel in the morning. 4] we'll DC IV Lasix 5] we'll put her on Demadex 60 mg by mouth daily.   LOS: 6 days   Noemy Hallmon S 06/30/2015,9:08 AM

## 2015-07-01 ENCOUNTER — Inpatient Hospital Stay (HOSPITAL_COMMUNITY): Payer: Medicare HMO

## 2015-07-01 DIAGNOSIS — K50918 Crohn's disease, unspecified, with other complication: Secondary | ICD-10-CM

## 2015-07-01 DIAGNOSIS — E876 Hypokalemia: Secondary | ICD-10-CM

## 2015-07-01 DIAGNOSIS — N179 Acute kidney failure, unspecified: Secondary | ICD-10-CM

## 2015-07-01 DIAGNOSIS — D649 Anemia, unspecified: Secondary | ICD-10-CM

## 2015-07-01 DIAGNOSIS — N183 Chronic kidney disease, stage 3 (moderate): Secondary | ICD-10-CM

## 2015-07-01 DIAGNOSIS — D696 Thrombocytopenia, unspecified: Secondary | ICD-10-CM

## 2015-07-01 DIAGNOSIS — K7469 Other cirrhosis of liver: Secondary | ICD-10-CM

## 2015-07-01 DIAGNOSIS — D689 Coagulation defect, unspecified: Secondary | ICD-10-CM

## 2015-07-01 DIAGNOSIS — D638 Anemia in other chronic diseases classified elsewhere: Secondary | ICD-10-CM

## 2015-07-01 LAB — CBC WITH DIFFERENTIAL/PLATELET
BASOS PCT: 1 %
Basophils Absolute: 0 10*3/uL (ref 0.0–0.1)
EOS ABS: 0.1 10*3/uL (ref 0.0–0.7)
Eosinophils Relative: 2 %
HEMATOCRIT: 23.6 % — AB (ref 36.0–46.0)
HEMOGLOBIN: 7.7 g/dL — AB (ref 12.0–15.0)
LYMPHS ABS: 1.1 10*3/uL (ref 0.7–4.0)
Lymphocytes Relative: 37 %
MCH: 30.4 pg (ref 26.0–34.0)
MCHC: 32.6 g/dL (ref 30.0–36.0)
MCV: 93.3 fL (ref 78.0–100.0)
Monocytes Absolute: 0.3 10*3/uL (ref 0.1–1.0)
Monocytes Relative: 9 %
NEUTROS ABS: 1.5 10*3/uL — AB (ref 1.7–7.7)
NEUTROS PCT: 52 %
PLATELETS: 51 10*3/uL — AB (ref 150–400)
RBC: 2.53 MIL/uL — AB (ref 3.87–5.11)
RDW: 18.7 % — AB (ref 11.5–15.5)
WBC: 2.9 10*3/uL — AB (ref 4.0–10.5)

## 2015-07-01 LAB — DIRECT ANTIGLOBULIN TEST (NOT AT ARMC)
DAT, IgG: NEGATIVE
DAT, complement: NEGATIVE

## 2015-07-01 LAB — RENAL FUNCTION PANEL
Albumin: 2.9 g/dL — ABNORMAL LOW (ref 3.5–5.0)
Anion gap: 3 — ABNORMAL LOW (ref 5–15)
BUN: 49 mg/dL — AB (ref 6–20)
CHLORIDE: 114 mmol/L — AB (ref 101–111)
CO2: 17 mmol/L — AB (ref 22–32)
Calcium: 8.6 mg/dL — ABNORMAL LOW (ref 8.9–10.3)
Creatinine, Ser: 1.92 mg/dL — ABNORMAL HIGH (ref 0.44–1.00)
GFR calc Af Amer: 32 mL/min — ABNORMAL LOW (ref >60)
GFR, EST NON AFRICAN AMERICAN: 27 mL/min — AB (ref >60)
Glucose, Bld: 153 mg/dL — ABNORMAL HIGH (ref 65–99)
POTASSIUM: 3.3 mmol/L — AB (ref 3.5–5.1)
Phosphorus: 3.6 mg/dL (ref 2.5–4.6)
Sodium: 134 mmol/L — ABNORMAL LOW (ref 135–145)

## 2015-07-01 LAB — LACTATE DEHYDROGENASE: LDH: 137 U/L (ref 98–192)

## 2015-07-01 LAB — CBC
HEMATOCRIT: 23.4 % — AB (ref 36.0–46.0)
Hemoglobin: 7.6 g/dL — ABNORMAL LOW (ref 12.0–15.0)
MCH: 30.2 pg (ref 26.0–34.0)
MCHC: 32.5 g/dL (ref 30.0–36.0)
MCV: 92.9 fL (ref 78.0–100.0)
PLATELETS: 60 10*3/uL — AB (ref 150–400)
RBC: 2.52 MIL/uL — ABNORMAL LOW (ref 3.87–5.11)
RDW: 18.7 % — AB (ref 11.5–15.5)
WBC: 3.2 10*3/uL — AB (ref 4.0–10.5)

## 2015-07-01 LAB — GLUCOSE, CAPILLARY: Glucose-Capillary: 103 mg/dL — ABNORMAL HIGH (ref 65–99)

## 2015-07-01 LAB — PROTIME-INR
INR: 1.78 — AB (ref 0.00–1.49)
Prothrombin Time: 20.7 seconds — ABNORMAL HIGH (ref 11.6–15.2)

## 2015-07-01 LAB — TECHNOLOGIST SMEAR REVIEW

## 2015-07-01 LAB — FIBRINOGEN: FIBRINOGEN: 134 mg/dL — AB (ref 204–475)

## 2015-07-01 MED ORDER — VITAMIN D 1000 UNITS PO TABS
1000.0000 [IU] | ORAL_TABLET | Freq: Every day | ORAL | Status: DC
  Administered 2015-07-02: 1000 [IU] via ORAL
  Filled 2015-07-01: qty 1

## 2015-07-01 MED ORDER — POTASSIUM CHLORIDE CRYS ER 20 MEQ PO TBCR
20.0000 meq | EXTENDED_RELEASE_TABLET | Freq: Once | ORAL | Status: AC
  Administered 2015-07-01: 20 meq via ORAL

## 2015-07-01 NOTE — Progress Notes (Signed)
[facility] Progress Note  Patient: Kathryn [name] JXB:[id number]   PCP: Elige Radon Dettinger, MD DOB: [birth year]   DOA: 06/24/2015   DOS: 07/01/2015   Date of Service: the patient was seen and examined on 07/01/2015  Subjective: The patient denies any acute complaint. Feels like she is at her baseline. No nausea no vomiting or abdominal pain. No confusion reported. No acute events overnight reported as well. No bleeding in ileostomy. Nutrition: Tolerating oral diet  Brief hospital course: Patient with past medical history of hepatitis C cirrhosis, chronic kidney disease stage III, Crohn's disease with ileostomy was admitted on 06/24/2015, with complaint of generalized weakness and dizziness with confusion, was found to have hepatic encephalopathy, acute kidney injury, hypokalemia. Patient has been treated with lactulose as well as rifaximin with significant improvement in ammonia level as well as mentation. Renal functions appears to be at her baseline. Nephrology and GI has been consulted. Patient has persistent anemia requiring blood transfusion on May 22. Currently further plan is further workup for anemia.  Assessment and Plan: 1. Hepatic encephalopathy (HCC) Ammonia level LVI, no asterixis. On lactulose 30 g 3 times a day with rifaximin. As per GI. Patient will require outpatient reversal of the TIPS procedure. Ambulated well with physical therapy, recommended no further follow-up.  2. Orthostatic hypertension. Blood pressure drops significantly on orthostasis. Nadolol has been discontinued. Continue midodrine. TED stockings.  3. Anemia. Status post 1 PRBC 06/29/2015. Iron normal. Folic acid B-12 level normal. Haptoglobin less than 10 undetectable. Concerning for hemolytic anemia although LDH normal. Check CBC, fibrinogen, technologist smear review, INR, Coombs' test.  4. Right lower leg edema. We will check lower extremity Doppler to rule out DVT. Continue diet  stocking. Continue torsemide.  5. Hepatitis C cirrhosis. Hepatitis C was treated in the past. Continue supportive care. And no evidence of SBP. No abdominal tenderness. Chronic bicytopenia. Stable at present after PRBC Mild chronic leukopenia as well as hypoalbuminemia status post IV albumin for 4 days started on 06/26/2015. Continue lactulose continue rifaximin. On Aldactone as well as torsemide. Not a candidate for transplant due to inflammatory bowel disease.  6. History of Crohn's disease. A status post ileostomy. No active bleeding. Continue nasal mine. No evidence of ongoing active Crohn's.  7. Acute on chronic kidney disease stage III. from combination of third spacing as well as hypotension. Improving with IV albumin. Mild elevation of serum creatinine today. We'll continue to closely monitor. Nadolol has been discontinued.  8. Volume overload secondary to hepatitis C cirrhosis. Echocardiogram in 2015 showed normal ejection fraction 60-65% without any diastolic dysfunction. No acute ins and outs. Weight down from 205 pound to 194 pounds. Clinically feeling better. We'll continue to monitor continue torsemide and Aldactone.  9. Hypokalemia. Patient is on 40 mEq of potassium a daily basis I would add 20 mg 1 today.  10 enterococcus UTI. Treated with fosfomycin. No evidence of burning urination fever or chills.  11. Anxiety. Chronic benzodiazepine at home. Dose reduced due to encephalopathy.  12. GERD. Continue PPI.  Pain management: When necessary Norco, chronic gabapentin on hold Activity: physical therapy recommends no further therapy requirement on discharge Bowel regimen: last BM 07/01/2015 Diet: Low-salt diet DVT Prophylaxis: subcutaneous Heparin  Advance goals of care discussion: Full code  Family Communication: no family was present at bedside, at the time of interview.   Disposition:  Discharge to home, pending stabilization of hemoglobin and  further workup of right lower extremity edema as well as anemia Expected discharge  date: 07/02/2015  Consultants: Gastroenterology, nephrology Procedures: Right lower extremity Doppler pending  Antibiotics: Anti-infectives    Start     Dose/Rate Route Frequency Ordered Stop   06/25/15 1045  rifaximin (XIFAXAN) tablet 550 mg     550 mg Oral 2 times daily 06/25/15 1032          Intake/Output Summary (Last 24 hours) at 07/01/15 0840 Last data filed at 06/30/15 2038  Gross per 24 hour  Intake    480 ml  Output      1 ml  Net    479 ml   Filed Weights   06/29/15 0500 06/30/15 0637 07/01/15 0500  Weight: 89.721 kg (197 lb 12.8 oz) 89.495 kg (197 lb 4.8 oz) 88.315 kg (194 lb 11.2 oz)    Objective: Physical Exam: Filed Vitals:   06/30/15 1252 06/30/15 2032 07/01/15 0304 07/01/15 0500  BP: 97/48 112/54 98/59   Pulse: 64 70 71   Temp: 98.4 F (36.9 C) 98.7 F (37.1 C) 97.8 F (36.6 C)   TempSrc: Oral Oral Oral   Resp: 20 16 20    Height:      Weight:    88.315 kg (194 lb 11.2 oz)  SpO2: 100% 96% 98%     General: Alert, Awake and Oriented to Time, Place and Person. Appear in no distress Eyes: PERRL, Conjunctiva normal ENT: Oral Mucosa clear moist. Neck: difficult to assess  JVD, no Abnormal Mass Or lumps Cardiovascular: S1 and S2 Present, aortic systolic Murmur, Peripheral Pulses Present Respiratory: Bilateral Air entry equal and Decreased, Clear to Auscultation, no Crackles, no wheezes Abdomen: Bowel Sound present, Soft and no tenderness Skin: no redness, no Rash  Extremities: right Pedal edema, no calf tenderness Neurologic: Grossly no focal neuro deficit. Bilaterally Equal motor strength  Data Reviewed: CBC:  Recent Labs Lab 06/25/15 0541 06/26/15 0501 06/28/15 0643 06/29/15 0600 06/29/15 2018 06/30/15 0642 07/01/15 0545  WBC 4.6 5.2 3.4* 4.1  --  3.1* 3.2*  NEUTROABS 2.3  --   --   --   --   --   --   HGB 8.3* 8.2* 7.2* 7.1* 8.0* 7.6* 7.6*  HCT 24.9*  24.6* 21.9* 21.7* 24.1* 23.4* 23.4*  MCV 89.2 89.5 91.3 91.6  --  92.9 92.9  PLT 93* 90* 70* 68*  --  56* 60*   Basic Metabolic Panel:  Recent Labs Lab 06/25/15 0541 06/25/15 1658  06/27/15 0642 06/28/15 0643 06/29/15 0600 06/30/15 0642 07/01/15 0545  NA 132*  --   < > 134* 135 134*  133* 135  135 134*  K 2.3* >7.5*  < > 3.1* 3.5 3.0*  3.0* 3.6  3.6 3.3*  CL 113*  --   < > 115* 116* 114*  113* 115*  114* 114*  CO2 15*  --   < > 15* 14* 14*  15* 16*  16* 17*  GLUCOSE 168*  --   < > 101* 114* 112*  112* 101*  102* 153*  BUN 40*  --   < > 47* 47* 47*  46* 49*  48* 49*  CREATININE 1.78*  --   < > 1.72* 1.75* 1.92*  1.97* 1.77*  1.83* 1.92*  CALCIUM 8.5*  --   < > 8.7* 8.7* 8.6*  8.6* 8.8*  8.7* 8.6*  MG 2.1 2.0  --   --  1.7 1.8 1.8  --   PHOS  --   --   --  3.4  --  3.6 3.6  3.6  < > = values in this interval not displayed.  Liver Function Tests:  Recent Labs Lab 06/24/15 1623 06/25/15 0541 06/27/15 4098 06/28/15 0643 06/29/15 0600 06/30/15 0642 07/01/15 0545  AST 26 24  --  19 20 19   --   ALT 22 20  --  14 14 13*  --   ALKPHOS 119 107  --  89 107 101  --   BILITOT 1.0 1.0  --  1.0 1.1 1.1  --   PROT 4.9* 4.6*  --  4.7* 4.9* 4.8*  --   ALBUMIN 2.5* 2.3* 2.6* 2.8* 3.0*  3.0* 3.1*  3.0* 2.9*   No results for input(s): LIPASE, AMYLASE in the last 168 hours.  Recent Labs Lab 06/24/15 1626 06/25/15 0542 06/30/15 0642  AMMONIA 138* 132* 56*   Coagulation Profile:  Recent Labs Lab 06/24/15 1623 06/25/15 1208  INR 1.50* 1.65*   Cardiac Enzymes: No results for input(s): CKTOTAL, CKMB, CKMBINDEX, TROPONINI in the last 168 hours. BNP (last 3 results) No results for input(s): PROBNP in the last 8760 hours.  CBG:  Recent Labs Lab 06/27/15 0809 06/28/15 0807 06/29/15 0724 06/30/15 0720 07/01/15 0726  GLUCAP 104* 104* 102* 100* 103*    Studies: No results found.   Scheduled Meds: . ALPRAZolam  0.5 mg Oral BID  . cholecalciferol  1,000  Units Oral Daily  . feeding supplement (ENSURE ENLIVE)  237 mL Oral Q24H  . feeding supplement (PRO-STAT SUGAR FREE 64)  30 mL Oral TID WC  . heparin  5,000 Units Subcutaneous Q8H  . lactulose  30 g Oral TID  . mesalamine  1,000 mg Rectal QHS  . midodrine  5 mg Oral TID WC  . multivitamin with minerals  1 tablet Oral Daily  . pantoprazole  40 mg Oral Daily  . potassium chloride  20 mEq Oral Once  . potassium chloride  40 mEq Oral Daily  . rifaximin  550 mg Oral BID  . sodium chloride flush  3 mL Intravenous Q12H  . spironolactone  50 mg Oral Daily  . torsemide  60 mg Oral Daily   Continuous Infusions:  PRN Meds: acetaminophen **OR** [DISCONTINUED] acetaminophen, diphenhydrAMINE, HYDROcodone-acetaminophen, [DISCONTINUED] ondansetron **OR** ondansetron (ZOFRAN) IV  Time spent: 30 minutes  Author: Lynden Oxford, MD [facility] Pager: [phone] 07/01/2015 8:40 AM  If 7PM-7AM, please contact night-coverage at [email or web address], password [facility]

## 2015-07-01 NOTE — Progress Notes (Signed)
Kathryn [name]  MRN: [id number]  DOB/AGE: [birth year] 60 y.o.  Primary Care Physician:Joshua A Dettinger, MD  Admit date: 06/24/2015  Chief Complaint:  Chief Complaint  Patient presents with  . Abnormal Lab    S-Pt presented on  06/24/2015 with  Chief Complaint  Patient presents with  . Abnormal Lab  .    Pt voices no new concerns.    Meds . ALPRAZolam  0.5 mg Oral BID  . cholecalciferol  1,000 Units Oral Daily  . feeding supplement (ENSURE ENLIVE)  237 mL Oral Q24H  . feeding supplement (PRO-STAT SUGAR FREE 64)  30 mL Oral TID WC  . heparin  5,000 Units Subcutaneous Q8H  . lactulose  30 g Oral TID  . mesalamine  1,000 mg Rectal QHS  . midodrine  5 mg Oral TID WC  . multivitamin with minerals  1 tablet Oral Daily  . pantoprazole  40 mg Oral Daily  . potassium chloride  40 mEq Oral Daily  . rifaximin  550 mg Oral BID  . sodium chloride flush  3 mL Intravenous Q12H  . spironolactone  50 mg Oral Daily  . torsemide  60 mg Oral Daily         Physical Exam: Vital signs in last 24 hours: Temp:  [97.8 F (36.6 C)-98.7 F (37.1 C)] 97.8 F (36.6 C) (05/24 0304) Pulse Rate:  [64-77] 71 (05/24 0304) Resp:  [16-20] 20 (05/24 0304) BP: (87-112)/(33-60) 98/59 mmHg (05/24 0304) SpO2:  [96 %-100 %] 98 % (05/24 0304) Weight:  [194 lb 11.2 oz (88.315 kg)] 194 lb 11.2 oz (88.315 kg) (05/24 0500) Weight change: -2 lb 9.6 oz (-1.179 kg) Last BM Date: 07/01/15  Intake/Output from previous day: 05/23 0701 - 05/24 0700 In: 480 [P.O.:480] Out: 1 [Urine:1] Total I/O In: 240 [P.O.:240] Out: -    Physical Exam: General- pt is awake,alert, oriented to time place and person Resp- No acute REsp distress, decreased at bases. CVS- S1S2 regular in rate and rhythm GIT- BS+, soft, NT, ND,. EXT- 1+ LE Edema, No Cyanosis   Lab Results: CBC    Component Value Date/Time   WBC 3.2* 07/01/2015 0545   WBC 12.1* 05/19/2015 1447   RBC 2.52* 07/01/2015 0545   RBC 2.40* 06/28/2015  0819   RBC 4.09 05/19/2015 1447   HGB 7.6* 07/01/2015 0545   HCT 23.4* 07/01/2015 0545   HCT 36.5 05/19/2015 1447   PLT 60* 07/01/2015 0545   PLT 116* 05/19/2015 1447   MCV 92.9 07/01/2015 0545   MCV 89 05/19/2015 1447   MCH 30.2 07/01/2015 0545   MCH 29.1 05/19/2015 1447   MCHC 32.5 07/01/2015 0545   MCHC 32.6 05/19/2015 1447   RDW 18.7* 07/01/2015 0545   RDW 15.9* 05/19/2015 1447   LYMPHSABS 1.7 06/25/2015 0541   LYMPHSABS 3.0 05/19/2015 1447   MONOABS 0.4 06/25/2015 0541   EOSABS 0.1 06/25/2015 0541   EOSABS 0.1 05/19/2015 1447   EOSABS 0.1 12/23/2013 1601   BASOSABS 0.0 06/25/2015 0541   BASOSABS 0.0 05/19/2015 1447        BMET  Recent Labs  06/30/15 0642 07/01/15 0545  NA 135  135 134*  K 3.6  3.6 3.3*  CL 115*  114* 114*  CO2 16*  16* 17*  GLUCOSE 101*  102* 153*  BUN 49*  48* 49*  CREATININE 1.77*  1.83* 1.92*  CALCIUM 8.8*  8.7* 8.6*   Sodium trend 2017  134=>135=>134  113=>129=>130  Creat trend 2017  2.1=>1.77=>1.92         1.5--2.7( Multiple admissions in 2017)   2016  1.3--1.8 2015   0.9--1.5   MICRO Recent Results (from the past 240 hour(s))  Urine culture     Status: Abnormal   Collection Time: 06/25/15  8:15 PM  Result Value Ref Range Status   Specimen Description URINE, RANDOM  Final   Special Requests NONE  Final   Culture >=100,000 COLONIES/mL ENTEROCOCCUS SPECIES (A)  Final   Report Status 06/28/2015 FINAL  Final   Organism ID, Bacteria ENTEROCOCCUS SPECIES (A)  Final      Susceptibility   Enterococcus species - MIC*    AMPICILLIN <=2 SENSITIVE Sensitive     LEVOFLOXACIN >=8 RESISTANT Resistant     NITROFURANTOIN <=16 SENSITIVE Sensitive     VANCOMYCIN 1 SENSITIVE Sensitive     * >=100,000 COLONIES/mL ENTEROCOCCUS SPECIES  MRSA PCR Screening     Status: None   Collection Time: 06/25/15  8:15 PM  Result Value Ref Range Status   MRSA by PCR NEGATIVE NEGATIVE Final    Comment:        The GeneXpert MRSA Assay  (FDA approved for NASAL specimens only), is one component of a comprehensive MRSA colonization surveillance program. It is not intended to diagnose MRSA infection nor to guide or monitor treatment for MRSA infections.       Lab Results  Component Value Date   CALCIUM 8.6* 07/01/2015   PHOS 3.6 07/01/2015       Impression: 1)Renal  AKI sec to Prerena/ATN                AKI on CKD               CKD stage 3.               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

## 2015-07-01 NOTE — Progress Notes (Signed)
Patient ID: [name], female   DOB: [birth year], 59 y.o.   MRN: [id number] Feels better. She is alert and oriented. She says she received 1 unit of PRBCs yesterday. Sitting up in bed eating breakfast. Blood pressure 98/59, pulse 71, temperature 97.8 F (36.6 C), temperature source Oral, resp. rate 20, height 5\' 5"  (1.651 m), weight 194 lb 11.2 oz (88.315 kg), SpO2 98 %. Ammonia 56 yesterday. Empytied ileostomy bag 5 to 6 times yesterday.   Will continue to monitor.

## 2015-07-02 LAB — COMPREHENSIVE METABOLIC PANEL
ALBUMIN: 2.9 g/dL — AB (ref 3.5–5.0)
ALK PHOS: 82 U/L (ref 38–126)
ALT: 12 U/L — AB (ref 14–54)
ANION GAP: 5 (ref 5–15)
AST: 18 U/L (ref 15–41)
BILIRUBIN TOTAL: 1.4 mg/dL — AB (ref 0.3–1.2)
BUN: 51 mg/dL — AB (ref 6–20)
CALCIUM: 8.6 mg/dL — AB (ref 8.9–10.3)
CO2: 18 mmol/L — AB (ref 22–32)
CREATININE: 1.75 mg/dL — AB (ref 0.44–1.00)
Chloride: 111 mmol/L (ref 101–111)
GFR calc Af Amer: 36 mL/min — ABNORMAL LOW (ref >60)
GFR calc non Af Amer: 31 mL/min — ABNORMAL LOW (ref >60)
GLUCOSE: 89 mg/dL (ref 65–99)
Potassium: 3.4 mmol/L — ABNORMAL LOW (ref 3.5–5.1)
SODIUM: 134 mmol/L — AB (ref 135–145)
TOTAL PROTEIN: 4.8 g/dL — AB (ref 6.5–8.1)

## 2015-07-02 LAB — FERRITIN: FERRITIN: 245 ng/mL (ref 11–307)

## 2015-07-02 LAB — CBC WITH DIFFERENTIAL/PLATELET
BASOS ABS: 0 10*3/uL (ref 0.0–0.1)
BASOS PCT: 0 %
EOS ABS: 0.1 10*3/uL (ref 0.0–0.7)
Eosinophils Relative: 3 %
HEMATOCRIT: 24.8 % — AB (ref 36.0–46.0)
HEMOGLOBIN: 8.1 g/dL — AB (ref 12.0–15.0)
Lymphocytes Relative: 36 %
Lymphs Abs: 0.9 10*3/uL (ref 0.7–4.0)
MCH: 30.5 pg (ref 26.0–34.0)
MCHC: 32.7 g/dL (ref 30.0–36.0)
MCV: 93.2 fL (ref 78.0–100.0)
MONOS PCT: 10 %
Monocytes Absolute: 0.3 10*3/uL (ref 0.1–1.0)
NEUTROS ABS: 1.3 10*3/uL — AB (ref 1.7–7.7)
NEUTROS PCT: 51 %
Platelets: 55 10*3/uL — ABNORMAL LOW (ref 150–400)
RBC: 2.66 MIL/uL — AB (ref 3.87–5.11)
RDW: 18.9 % — ABNORMAL HIGH (ref 11.5–15.5)
WBC: 2.6 10*3/uL — AB (ref 4.0–10.5)

## 2015-07-02 LAB — GLUCOSE, CAPILLARY: Glucose-Capillary: 77 mg/dL (ref 65–99)

## 2015-07-02 MED ORDER — POTASSIUM CHLORIDE 20 MEQ PO PACK: 40.0000 meq | PACK | Freq: Every day | ORAL | Status: DC

## 2015-07-02 MED ORDER — SPIRONOLACTONE 25 MG PO TABS: 50.0000 mg | ORAL_TABLET | Freq: Every day | ORAL | Status: DC

## 2015-07-02 MED ORDER — PRO-STAT SUGAR FREE PO LIQD: 30.0000 mL | Freq: Three times a day (TID) | ORAL | Status: AC

## 2015-07-02 MED ORDER — MIDODRINE HCL 5 MG PO TABS: 5.0000 mg | ORAL_TABLET | Freq: Three times a day (TID) | ORAL | Status: DC

## 2015-07-02 MED ORDER — RIFAXIMIN 550 MG PO TABS: 550.0000 mg | ORAL_TABLET | Freq: Two times a day (BID) | ORAL | Status: AC

## 2015-07-02 MED ORDER — LACTULOSE 10 GM/15ML PO SOLN: 30.0000 g | Freq: Three times a day (TID) | ORAL | Status: AC

## 2015-07-02 MED ORDER — TORSEMIDE 20 MG PO TABS: 60.0000 mg | ORAL_TABLET | Freq: Every day | ORAL | Status: DC

## 2015-07-02 MED ORDER — ALPRAZOLAM 0.5 MG PO TABS: 0.5000 mg | ORAL_TABLET | Freq: Every evening | ORAL | Status: DC | PRN

## 2015-07-02 NOTE — Care Management Important Message (Signed)
Important Message  Patient Details  Name: Kathryn [name] MRN: [id number] Date of Birth: 1955/11/28   Medicare Important Message Given:  Yes    Adonis Huguenin, RN 07/02/2015, 11:03 AM

## 2015-07-02 NOTE — Care Management Note (Addendum)
Case Management Note  Patient Details  Name: Kathryn [name] MRN: [id number] Date of Birth: [birth year]  Subjective/Objective:                   Spoke with patient who is alert and oriented at this time from home with husband. Continued discharge planing. Benefits check on Home medications.  Patient to be discharged today with [facility] RN HRI program with [facility].  Alroy Bailiff at [facility] notified of impending discharge.   Action/Plan:Home with [facility]  Expected Discharge Date:                  Expected Discharge Plan:  Home w Home Health Services  In-House Referral:  NA  Discharge planning Services  CM Consult  Post Acute Care Choice:  Home Health Choice offered to:  Patient  DME Arranged:    DME Agency:     HH Arranged:    HH Agency:  [facility]  Status of Service:  Completed, signed off  Medicare Important Message Given:  Yes Date Medicare IM Given:    Medicare IM give by:    Date Additional Medicare IM Given:    Additional Medicare Important Message give by:     If discussed at Long Length of Stay Meetings, dates discussed:    Additional Comments:  Adonis Huguenin, RN 07/02/2015, 10:41 AM

## 2015-07-02 NOTE — Progress Notes (Signed)
Pt discharged home today per Dr. Patel. Pt's IV site D/C'd and WDL. Pt's VSS. Pt provided with home medication list, discharge instructions and prescriptions. Verbalized understanding. Pt left floor via WC in stable condition accompanied by NT. 

## 2015-07-02 NOTE — Discharge Summary (Signed)
[facility] Discharge Summary   Patient: Kathryn [name] GEX:[id number]   PCP: Elige Radon Dettinger, MD DOB: [birth year]   Date of admission: 06/24/2015   Date of discharge: 07/02/2015    Discharge Diagnoses:  Principal Problem:   Hepatic encephalopathy (HCC) Active Problems:   Crohn's disease (HCC)   Hepatic cirrhosis (HCC)   GERD (gastroesophageal reflux disease)   Insomnia   Thrombocytopenia (HCC)   Coagulopathy (HCC)   Anemia of chronic disease   Hyponatremia   Hypokalemia   CKD (chronic kidney disease) stage 3, GFR 30-59 ml/min   Hyperammonemia (HCC)   AKI (acute kidney injury) (HCC)   Chronic anemia   Confusion   Hypomagnesemia  Recommendations for Outpatient Follow-up:  1. Please follow-up with PCP as well as gastroenterology in 1-2 weeks   Follow-up Information    Follow up with [facility].   Contact information:   [address] [place] [address] [phone]       Follow up with Elige Radon Dettinger, MD. Schedule an appointment as soon as possible for a visit in 1 week.   Specialties:  Family Medicine, Cardiology   Why:  with CBC and BMP   Contact information:   [address] [place] [address] [phone]       Follow up with Lionel December, MD. Schedule an appointment as soon as possible for a visit in 2 weeks.   Specialty:  Gastroenterology   Why:  for follow up on hepatic cirrhosis   Contact information:   [address], [address] [place] [address] [phone]      Diet recommendation: Cardiac diet  Activity: The patient is advised to gradually reintroduce usual activities.  Discharge Condition: fair  History of present illness: As per the H and P dictated on admission, "Kathryn [name] is a 60 y.o. female with medical history significant for successfully-treated hep C, liver cirrhosis, chronic kidney disease stage III, and Crohn's disease status-post multiple surgeries and ileostomy who presents to the ED with  several days of generalized weakness, dizziness, and confusion. She was evaluated for these complaints yesterday by her gastroenterologist and some blood work at been ordered. Ammonia level was noted to be markedly elevated, and with worsening symptoms, she was directed to the emergency department for evaluation. Patient denies any recent fevers or chills. She denies abdominal pain or vomiting. She reports worsening in her chronic lower extremity edema. She denies chest pain or palpitations. She reports that she is continue to take her lactulose as prescribed and empties her ileostomy about 4 times daily. She has had multiple recent admissions under similar circumstances.  ED Course: Upon arrival to the ED, patient is found to be afebrile, saturating well on room air, blood pressure 100/52, vitals otherwise stable. EKG features a sinus rhythm with U-waves. CMP is notable for a sodium of 129, potassium 2.2, bicarbonate of 14, and serum creatinine of 2.06, up from 1.56 10 days ago. CBC features a hemoglobin of 8.5 and platelet count of 92,000. INR is elevated to 1.5. Urine was obtained and on analysis is unremarkable. A 500 mL normal saline bolus was administered in the emergency department and intravenous magnesium and potassium were supplemented. A 20 g dose of oral lactulose was administered. Patient remained hemodynamically stable in the emergency department and will be admitted to the hospital for ongoing evaluation and management of hepatic encephalopathy, acute on chronic kidney injury, and hypokalemia."  Hospital Course:  Patient with past medical  history of hepatitis C cirrhosis, chronic kidney disease stage III, Crohn's disease with ileostomy was admitted on 06/24/2015, with complaint of generalized weakness and dizziness with confusion, was found to have hepatic encephalopathy, acute kidney injury, hypokalemia. Patient has been treated with lactulose as well as rifaximin with significant improvement in  ammonia level as well as mentation. Renal functions appears to be at her baseline. Nephrology and GI has been consulted. Patient has persistent anemia requiring blood transfusion on May 22.  Summary of her active problems in the hospital is as following. 1. Hepatic encephalopathy (HCC) Ammonia level 56, no asterixis. On lactulose 30 g 3 times a day with rifaximin. As per GI. Patient will require outpatient reversal of the TIPS procedure. Ambulated well with physical therapy, recommended no further follow-up.  2. Orthostatic hypertension. Blood pressure dropped significantly on orthostasis. Nadolol has been discontinued, and symptoms resolved Continue midodrine. TED stockings.  3. Anemia. Status post 1 PRBC 06/29/2015. Iron normal. Folic acid B-12 level normal. Haptoglobin less than 10 undetectable as well as fibrinogen level low. Concerning for hemolytic anemia although LDH normal. H&H remains stable. Negative Coombs, technologist smear review also does not show any RBC destruction. Based on this I briefly discussed with hematology on phone who recommended that it is unlikely that the patient is having a hemolytic anemia. More likely this her presence poor production of protein molecules-haptoglobin and fibrinogen-from advance liver cirrhosis. GI does not consider the patient is actively bleeding. Continue nutritional supplementation.  4. Right lower leg edema. lower extremity Doppler ruled out DVT. Continue torsemide.  5. Hepatitis C cirrhosis. Hepatitis C was treated in the past. Continue supportive care. And no evidence of SBP. No abdominal tenderness. Chronic pancytopenia. Stable at present after PRBC Hypoalbuminemia, status post IV albumin for 4 days started on 06/26/2015. Continue lactulose continue rifaximin. On Aldactone as well as torsemide. Not a candidate for transplant due to inflammatory bowel disease.  6. History of Crohn's disease. A status post ileostomy. No  active bleeding. Continue mesalamine. No evidence of ongoing active Crohn's.  7. Acute on chronic kidney disease stage III. from combination of third spacing as well as hypotension. Improving with IV albumin. Renal function stable We'll continue to closely monitor. Nadolol has been discontinued.  8. Volume overload secondary to hepatitis C cirrhosis. Echocardiogram in 2015 showed normal ejection fraction 60-65% without any diastolic dysfunction. No acute ins and outs. Weight down from 205 pound to 194 pounds. Clinically feeling better. We'll continue to monitor continue torsemide and Aldactone.  9. Hypokalemia. Patient is on 40 mEq of potassium a daily basis  10 enterococcus UTI. Treated with fosfomycin. No evidence of burning urination fever or chills.  11. Anxiety. Chronic benzodiazepine at home. Dose reduced due to encephalopathy.  12. GERD. Continue PPI.  All other chronic medical condition were stable during the hospitalization.  Patient was seen by physical therapy, who recommended no further therapy requirement, due to patient's recurrent admission home health for high risk evaluation was arranged by Child psychotherapist and case Production designer, theatre/television/film. On the day of the discharge the patient's vitals were stable, and no other acute medical condition were reported by patient. the patient was felt safe to be discharge at home with family.  Procedures and Results:  RIGHT LOWER EXTREMITY VENOUS DOPPLER ULTRASOUND  IMPRESSION: Sonographic survey of the right lower extremity negative for DVT  Consultations:  Gastroenterology  Nephrology  DISCHARGE MEDICATION: Discharge Medication List as of 07/02/2015 11:19 AM    START taking these medications  Details  Amino Acids-Protein Hydrolys (FEEDING SUPPLEMENT, PRO-STAT SUGAR FREE 64,) LIQD Take 30 mLs by mouth 3 (three) times daily with meals., Starting 07/02/2015, Until Discontinued, Normal    midodrine (PROAMATINE) 5 MG tablet Take 1  tablet (5 mg total) by mouth 3 (three) times daily with meals., Starting 07/02/2015, Until Discontinued, Normal    rifaximin (XIFAXAN) 550 MG TABS tablet Take 1 tablet (550 mg total) by mouth 2 (two) times daily., Starting 07/02/2015, Until Discontinued, Normal    torsemide (DEMADEX) 20 MG tablet Take 3 tablets (60 mg total) by mouth daily., Starting 07/02/2015, Until Discontinued, Normal      CONTINUE these medications which have CHANGED   Details  ALPRAZolam (XANAX) 0.5 MG tablet Take 1 tablet (0.5 mg total) by mouth at bedtime as needed for anxiety., Starting 07/02/2015, Until Discontinued, Print    lactulose (CHRONULAC) 10 GM/15ML solution Take 45 mLs (30 g total) by mouth 3 (three) times daily., Starting 07/02/2015, Until Discontinued, Normal    potassium chloride (KLOR-CON) 20 MEQ packet Take 40 mEq by mouth daily., Starting 07/02/2015, Until Discontinued, Normal    spironolactone (ALDACTONE) 25 MG tablet Take 2 tablets (50 mg total) by mouth daily., Starting 07/02/2015, Until Discontinued, Normal      CONTINUE these medications which have NOT CHANGED   Details  Cholecalciferol (VITAMIN D-1000 MAX ST) 1000 UNITS tablet Take 1,000 Units by mouth daily., Until Discontinued, Historical Med    feeding supplement, ENSURE ENLIVE, (ENSURE ENLIVE) LIQD Take 237 mLs by mouth daily., Starting 03/27/2015, Until Discontinued, No Print    gabapentin (NEURONTIN) 100 MG capsule Take 1 capsule (100 mg total) by mouth 3 (three) times daily., Starting 04/23/2015, Until Discontinued, Normal    mesalamine (CANASA) 1000 MG suppository Place 1 suppository (1,000 mg total) rectally at bedtime., Starting 04/22/2015, Until Discontinued, Normal    Multiple Vitamins-Minerals (MULTIVITAMIN WITH MINERALS) tablet Take 1 tablet by mouth daily., Until Discontinued, Historical Med    pantoprazole (PROTONIX) 40 MG tablet TAKE 1 TABLET BY MOUTH DAILY, Normal    Probiotic Product (PROBIOTIC PO) Take 1 tablet by mouth  daily., Until Discontinued, Historical Med      STOP taking these medications     furosemide (LASIX) 20 MG tablet      nadolol (CORGARD) 20 MG tablet      potassium chloride 20 MEQ TBCR        Allergies  Allergen Reactions  . Penicillins Rash    Has patient had a PCN reaction causing immediate rash, facial/tongue/throat swelling, SOB or lightheadedness with hypotension: Yes Has patient had a PCN reaction causing severe rash involving mucus membranes or skin necrosis: No Has patient had a PCN reaction that required hospitalization No Has patient had a PCN reaction occurring within the last 10 years: No If all of the above answers are "NO", then may proceed with Cephalosporin use.    Discharge Instructions    Diet - low sodium heart healthy    Complete by:  As directed      Discharge instructions    Complete by:  As directed   It is important that you read following instructions as well as go over your medication list with RN to help you understand your care after this hospitalization.  Discharge Instructions: Please follow-up with PCP in one week  Please request your primary care physician to go over all Hospital Tests and Procedure/Radiological results at the follow up,  Please get all Hospital records sent to your PCP by signing hospital  release before you go home.   Do not take more than prescribed Pain, Sleep and Anxiety Medications. You were cared for by a hospitalist during your hospital stay. If you have any questions about your discharge medications or the care you received while you were in the hospital after you are discharged, you can call the unit and ask to speak with the hospitalist on call if the hospitalist that took care of you is not available.  Once you are discharged, your primary care physician will handle any further medical issues. Please note that NO REFILLS for any discharge medications will be authorized once you are discharged, as it is imperative that  you return to your primary care physician (or establish a relationship with a primary care physician if you do not have one) for your aftercare needs so that they can reassess your need for medications and monitor your lab values. You Must read complete instructions/literature along with all the possible adverse reactions/side effects for all the Medicines you take and that have been prescribed to you. Take any new Medicines after you have completely understood and accept all the possible adverse reactions/side effects. Wear Seat belts while driving. If you have smoked or chewed Tobacco in the last 2 yrs please stop smoking and/or stop any Recreational drug use.     Increase activity slowly    Complete by:  As directed           Discharge Exam: Filed Weights   06/30/15 0637 07/01/15 0500 07/02/15 0500  Weight: 89.495 kg (197 lb 4.8 oz) 88.315 kg (194 lb 11.2 oz) 87.544 kg (193 lb)   Filed Vitals:   07/02/15 0513 07/02/15 0910  BP: 110/53 118/54  Pulse: 80 81  Temp: 97.8 F (36.6 C)   Resp: 20    General: Appear in no distress, no Rash; Oral Mucosa moist. Cardiovascular: S1 and S2 Present, no Murmur, no JVD Respiratory: Bilateral Air entry present and Clear to Auscultation, no Crackles, no wheezes Abdomen: Bowel Sound present, Soft and no tenderness Extremities: right Pedal edema, no calf tenderness Neurology: Grossly no focal neuro deficit.  The results of significant diagnostics from this hospitalization (including imaging, microbiology, ancillary and laboratory) are listed below for reference.    Significant Diagnostic Studies: US Venous Img Lower Unilateral Right  07/01/2015  CLINICAL DATA:  60 year old female with a history of edema EXAM: RIGHT LOWER EXTREMITY VENOUS DOPPLER ULTRASOUND TECHNIQUE: Gray-scale sonography with graded compression, as well as color Doppler and duplex ultrasound were performed to evaluate the lower extremity deep venous systems from the level of the  common femoral vein and including the common femoral, femoral, profunda femoral, popliteal and calf veins including the posterior tibial, peroneal and gastrocnemius veins when visible. The superficial great saphenous vein was also interrogated. Spectral Doppler was utilized to evaluate flow at rest and with distal augmentation maneuvers in the common femoral, femoral and popliteal veins. COMPARISON:  None. FINDINGS: Contralateral Common Femoral Vein: Respiratory phasicity is normal and symmetric with the symptomatic side. No evidence of thrombus. Normal compressibility. Common Femoral Vein: No evidence of thrombus. Normal compressibility, respiratory phasicity and response to augmentation. Saphenofemoral Junction: No evidence of thrombus. Normal compressibility and flow on color Doppler imaging. Profunda Femoral Vein: No evidence of thrombus. Normal compressibility and flow on color Doppler imaging. Femoral Vein: No evidence of thrombus. Normal compressibility, respiratory phasicity and response to augmentation. Popliteal Vein: No evidence of thrombus. Normal compressibility, respiratory phasicity and response to augmentation. Calf Veins: No evidence  of thrombus. Normal compressibility and flow on color Doppler imaging. Superficial Great Saphenous Vein: No evidence of thrombus. Normal compressibility and flow on color Doppler imaging. Other Findings:  None. IMPRESSION: Sonographic survey of the right lower extremity negative for DVT. Signed, Yvone Neu. Loreta Ave, DO [facility] Electronically Signed   By: Gilmer Mor D.O.   On: 07/01/2015 15:06   Dg Chest Port 1 View  06/10/2015  CLINICAL DATA:  Status post PICC Line placement. Weakness, dizziness. History of HTN, Renal insufficiency, crohn's, cirrhosis. EXAM: PORTABLE CHEST 1 VIEW COMPARISON:  06/08/2015 FINDINGS: Right-sided central line, likely from an internal jugular approach. This terminates at the low  SVC. Midline trachea. Normal heart size. Atherosclerosis in the transverse aorta. No pleural effusion or pneumothorax. Clear lungs. IMPRESSION: Right internal jugular line with tip at low SVC.  No pneumothorax. Aortic atherosclerosis. Electronically Signed   By: Jeronimo Greaves M.D.   On: 06/10/2015 16:39   Dg Chest Port 1 View  06/08/2015  CLINICAL DATA:  Generalized weakness and nausea for 2 days. Initial encounter. EXAM: PORTABLE CHEST 1 VIEW COMPARISON:  PA and lateral chest 05/21/2015. Single view of the chest 04/16/2015. FINDINGS: The lungs are clear. Heart size is normal. No pneumothorax or pleural effusion. No focal bony abnormality. IMPRESSION: Negative chest. Electronically Signed   By: Drusilla Kanner M.D.   On: 06/08/2015 13:25    Microbiology: Recent Results (from the past 240 hour(s))  Urine culture     Status: Abnormal   Collection Time: 06/25/15  8:15 PM  Result Value Ref Range Status   Specimen Description URINE, RANDOM  Final   Special Requests NONE  Final   Culture >=100,000 COLONIES/mL ENTEROCOCCUS SPECIES (A)  Final   Report Status 06/28/2015 FINAL  Final   Organism ID, Bacteria ENTEROCOCCUS SPECIES (A)  Final      Susceptibility   Enterococcus species - MIC*    AMPICILLIN <=2 SENSITIVE Sensitive     LEVOFLOXACIN >=8 RESISTANT Resistant     NITROFURANTOIN <=16 SENSITIVE Sensitive     VANCOMYCIN 1 SENSITIVE Sensitive     * >=100,000 COLONIES/mL ENTEROCOCCUS SPECIES  MRSA PCR Screening     Status: None   Collection Time: 06/25/15  8:15 PM  Result Value Ref Range Status   MRSA by PCR NEGATIVE NEGATIVE Final    Comment:        The GeneXpert MRSA Assay (FDA approved for NASAL specimens only), is one component of a comprehensive MRSA colonization surveillance program. It is not intended to diagnose MRSA infection nor to guide or monitor treatment for MRSA infections.      Labs: CBC:  Recent Labs Lab 06/29/15 0600 06/29/15 2018 06/30/15 0642 07/01/15 0545  07/01/15 0945 07/02/15 0717  WBC 4.1  --  3.1* 3.2* 2.9* 2.6*  NEUTROABS  --   --   --   --  1.5* 1.3*  HGB 7.1* 8.0* 7.6* 7.6* 7.7* 8.1*  HCT 21.7* 24.1* 23.4* 23.4* 23.6* 24.8*  MCV 91.6  --  92.9 92.9 93.3 93.2  PLT 68*  --  56* 60* 51* 55*   Basic Metabolic Panel:  Recent Labs Lab 06/25/15 1658  06/27/15 0642 06/28/15 0643 06/29/15 0600 06/30/15 0642 07/01/15 0545 07/02/15 0717  NA  --   < > 134* 135 134*  133* 135  135 134* 134*  K >7.5*  < > 3.1* 3.5 3.0*  3.0* 3.6  3.6 3.3* 3.4*  CL  --   < >  115* 116* 114*  113* 115*  114* 114* 111  CO2  --   < > 15* 14* 14*  15* 16*  16* 17* 18*  GLUCOSE  --   < > 101* 114* 112*  112* 101*  102* 153* 89  BUN  --   < > 47* 47* 47*  46* 49*  48* 49* 51*  CREATININE  --   < > 1.72* 1.75* 1.92*  1.97* 1.77*  1.83* 1.92* 1.75*  CALCIUM  --   < > 8.7* 8.7* 8.6*  8.6* 8.8*  8.7* 8.6* 8.6*  MG 2.0  --   --  1.7 1.8 1.8  --   --   PHOS  --   --  3.4  --  3.6 3.6 3.6  --   < > = values in this interval not displayed. Liver Function Tests:  Recent Labs Lab 06/28/15 0643 06/29/15 0600 06/30/15 0642 07/01/15 0545 07/02/15 0717  AST 19 20 19   --  18  ALT 14 14 13*  --  12*  ALKPHOS 89 107 101  --  82  BILITOT 1.0 1.1 1.1  --  1.4*  PROT 4.7* 4.9* 4.8*  --  4.8*  ALBUMIN 2.8* 3.0*  3.0* 3.1*  3.0* 2.9* 2.9*   No results for input(s): LIPASE, AMYLASE in the last 168 hours.  Recent Labs Lab 06/30/15 0642  AMMONIA 56*   Cardiac Enzymes: No results for input(s): CKTOTAL, CKMB, CKMBINDEX, TROPONINI in the last 168 hours. BNP (last 3 results)  Recent Labs  03/26/15 0345  BNP 74.0   CBG:  Recent Labs Lab 06/28/15 0807 06/29/15 0724 06/30/15 0720 07/01/15 0726 07/02/15 0747  GLUCAP 104* 102* 100* 103* 77   Time spent: 30 minutes  Signed:  Tequia Wolman  [facility] 07/02/2015 , 4:08 PM

## 2015-07-03 ENCOUNTER — Telehealth: Payer: Self-pay | Admitting: *Deleted

## 2015-07-03 LAB — TYPE AND SCREEN
ABO/RH(D): O NEG
ANTIBODY SCREEN: NEGATIVE
UNIT DIVISION: 0
UNIT DIVISION: 0

## 2015-07-07 ENCOUNTER — Encounter: Payer: Self-pay | Admitting: Family Medicine

## 2015-07-07 ENCOUNTER — Ambulatory Visit (INDEPENDENT_AMBULATORY_CARE_PROVIDER_SITE_OTHER): Payer: Medicare HMO | Admitting: Family Medicine

## 2015-07-07 VITALS — BP 102/64 | HR 84 | Temp 98.0°F | Ht 65.0 in | Wt 186.0 lb

## 2015-07-07 DIAGNOSIS — I9589 Other hypotension: Secondary | ICD-10-CM | POA: Diagnosis not present

## 2015-07-07 DIAGNOSIS — N3000 Acute cystitis without hematuria: Secondary | ICD-10-CM | POA: Diagnosis not present

## 2015-07-07 DIAGNOSIS — K7469 Other cirrhosis of liver: Secondary | ICD-10-CM | POA: Diagnosis not present

## 2015-07-07 LAB — URINALYSIS, COMPLETE
Bilirubin, UA: NEGATIVE
GLUCOSE, UA: NEGATIVE
Ketones, UA: NEGATIVE
NITRITE UA: NEGATIVE
PH UA: 5.5 (ref 5.0–7.5)
Specific Gravity, UA: 1.015 (ref 1.005–1.030)
Urobilinogen, Ur: 0.2 mg/dL (ref 0.2–1.0)

## 2015-07-07 LAB — MICROSCOPIC EXAMINATION

## 2015-07-07 MED ORDER — CIPROFLOXACIN HCL 250 MG PO TABS: 250.0000 mg | ORAL_TABLET | Freq: Two times a day (BID) | ORAL | Status: DC

## 2015-07-07 NOTE — Progress Notes (Signed)
BP 102/64 mmHg  Pulse 84  Temp(Src) 98 F (36.7 C) (Oral)  Ht 5\' 5"  (1.651 m)  Wt 186 lb (84.369 kg)  BMI 30.95 kg/m2   Subjective:    Patient ID: Kathryn [name], female    DOB: [birth year], 60 y.o.   MRN: [id number]  HPI: Kathryn [name] is a 60 y.o. female presenting on 07/07/2015 for Hospital folllowup and Chest congestion   HPI Hospital follow-up for cirrhosis and hypotension Patient is coming in today for a hospital follow-up for cirrhosis and hypotension. 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 She says that the confusion that she was having prior to being sent to the hospital last time is much improved and she is not forgetting things as frequently. She denies any significant swelling more than she normally has. She has been taking her diuretic and has been able to keep most fluid off. She denies any abdominal pain or chest pain or shortness of breath. She does have a little bit of a cough and congestion and is working to get that fluid out.  Dysuria Patient is coming in with suprapubic abdominal pain and dysuria but denies any hematuria. She has been trying to keep up on her fluid intake because of her diuretic but sometimes she does get dry. She denies any fevers or chills or flank pain.  Relevant past medical, surgical, family and social history reviewed and updated as indicated. Interim medical history since our last visit reviewed. Allergies and medications reviewed and updated.  Review of Systems  Constitutional: Negative for fever and chills.  HENT: Negative for congestion, ear discharge and ear pain.   Eyes: Negative for redness and visual disturbance.  Respiratory: Negative for chest tightness and shortness of breath.   Cardiovascular: Negative for chest pain and leg swelling.  Gastrointestinal: Positive for  abdominal pain.  Genitourinary: Positive for dysuria, urgency and frequency. Negative for hematuria, flank pain and difficulty urinating.  Musculoskeletal: Negative for back pain and gait problem.  Skin: Negative for rash.  Neurological: Negative for dizziness, light-headedness and headaches.  Psychiatric/Behavioral: Negative for behavioral problems and agitation.  All other systems reviewed and are negative.   Per HPI unless specifically indicated above     Medication List       This list is accurate as of: 07/07/15 11:14 AM.  Always use your most recent med list.               ALPRAZolam 0.5 MG tablet  Commonly known as:  XANAX  Take 1 tablet (0.5 mg total) by mouth at bedtime as needed for anxiety.     ciprofloxacin 250 MG tablet  Commonly known as:  CIPRO  Take 1 tablet (250 mg total) by mouth 2 (two) times daily.     feeding supplement (ENSURE ENLIVE) Liqd  Take 237 mLs by mouth daily.     feeding supplement (PRO-STAT SUGAR FREE 64) Liqd  Take 30 mLs by mouth 3 (three) times daily with meals.     gabapentin 100 MG capsule  Commonly known as:  NEURONTIN  Take 1 capsule (100 mg total) by mouth 3 (three) times daily.     lactulose 10 GM/15ML solution  Commonly known as:  CHRONULAC  Take 45 mLs (30 g total) by mouth 3 (three) times daily.     mesalamine 1000 MG  suppository  Commonly known as:  CANASA  Place 1 suppository (1,000 mg total) rectally at bedtime.     midodrine 5 MG tablet  Commonly known as:  PROAMATINE  Take 1 tablet (5 mg total) by mouth 3 (three) times daily with meals.     multivitamin with minerals tablet  Take 1 tablet by mouth daily.     pantoprazole 40 MG tablet  Commonly known as:  PROTONIX  TAKE 1 TABLET BY MOUTH DAILY     potassium chloride 20 MEQ packet  Commonly known as:  KLOR-CON  Take 40 mEq by mouth daily.     PROBIOTIC PO  Take 1 tablet by mouth daily.     rifaximin 550 MG Tabs tablet  Commonly known as:  XIFAXAN  Take  1 tablet (550 mg total) by mouth 2 (two) times daily.     spironolactone 25 MG tablet  Commonly known as:  ALDACTONE  Take 2 tablets (50 mg total) by mouth daily.     torsemide 20 MG tablet  Commonly known as:  DEMADEX  Take 3 tablets (60 mg total) by mouth daily.     VITAMIN D-1000 MAX ST 1000 units tablet  Generic drug:  Cholecalciferol  Take 1,000 Units by mouth daily.           Objective:    BP 102/64 mmHg  Pulse 84  Temp(Src) 98 F (36.7 C) (Oral)  Ht 5\' 5"  (1.651 m)  Wt 186 lb (84.369 kg)  BMI 30.95 kg/m2  Wt Readings from Last 3 Encounters:  07/07/15 186 lb (84.369 kg)  07/02/15 193 lb (87.544 kg)  06/23/15 205 lb (92.987 kg)    Physical Exam  Constitutional: She is oriented to person, place, and time. She appears well-developed and well-nourished. No distress.  Eyes: Conjunctivae and EOM are normal. Pupils are equal, round, and reactive to light.  Neck: Neck supple. No thyromegaly present.  Cardiovascular: Normal rate, regular rhythm, normal heart sounds and intact distal pulses.   No murmur heard. Pulmonary/Chest: Effort normal and breath sounds normal. No respiratory distress. She has no wheezes.  Abdominal: Soft. Bowel sounds are normal. She exhibits no distension. There is no hepatosplenomegaly. There is tenderness in the suprapubic area. There is no rigidity, no rebound, no guarding and no CVA tenderness.  Musculoskeletal: Normal range of motion. She exhibits no edema or tenderness.  Lymphadenopathy:    She has no cervical adenopathy.  Neurological: She is alert and oriented to person, place, and time. Coordination normal.  Skin: Skin is warm and dry. No rash noted. She is not diaphoretic.  Psychiatric: She has a normal mood and affect. Her behavior is normal.  Nursing note and vitals reviewed.   Results for orders placed or performed during the hospital encounter of 06/24/15  Urine culture  Result Value Ref Range   Specimen Description URINE, RANDOM      Special Requests NONE    Culture >=100,000 COLONIES/mL ENTEROCOCCUS SPECIES (A)    Report Status 06/28/2015 FINAL    Organism ID, Bacteria ENTEROCOCCUS SPECIES (A)       Susceptibility   Enterococcus species - MIC*    AMPICILLIN <=2 SENSITIVE Sensitive     LEVOFLOXACIN >=8 RESISTANT Resistant     NITROFURANTOIN <=16 SENSITIVE Sensitive     VANCOMYCIN 1 SENSITIVE Sensitive     * >=100,000 COLONIES/mL ENTEROCOCCUS SPECIES  MRSA PCR Screening  Result Value Ref Range   MRSA by PCR NEGATIVE NEGATIVE  CBC  Result Value Ref  Range   WBC 5.6 4.0 - 10.5 K/uL   RBC 2.83 (L) 3.87 - 5.11 MIL/uL   Hemoglobin 8.5 (L) 12.0 - 15.0 g/dL   HCT 16.1 (L) 09.6 - 04.5 %   MCV 89.0 78.0 - 100.0 fL   MCH 30.0 26.0 - 34.0 pg   MCHC 33.7 30.0 - 36.0 g/dL   RDW 40.9 (H) 81.1 - 91.4 %   Platelets 92 (L) 150 - 400 K/uL  Comprehensive metabolic panel  Result Value Ref Range   Sodium 129 (L) 135 - 145 mmol/L   Potassium 2.2 (LL) 3.5 - 5.1 mmol/L   Chloride 110 101 - 111 mmol/L   CO2 14 (L) 22 - 32 mmol/L   Glucose, Bld 122 (H) 65 - 99 mg/dL   BUN 43 (H) 6 - 20 mg/dL   Creatinine, Ser 7.82 (H) 0.44 - 1.00 mg/dL   Calcium 8.3 (L) 8.9 - 10.3 mg/dL   Total Protein 4.9 (L) 6.5 - 8.1 g/dL   Albumin 2.5 (L) 3.5 - 5.0 g/dL   AST 26 15 - 41 U/L   ALT 22 14 - 54 U/L   Alkaline Phosphatase 119 38 - 126 U/L   Total Bilirubin 1.0 0.3 - 1.2 mg/dL   GFR calc non Af Amer 25 (L) >60 mL/min   GFR calc Af Amer 29 (L) >60 mL/min   Anion gap 5 5 - 15  Urinalysis, Routine w reflex microscopic (not at [facility])  Result Value Ref Range   Color, Urine YELLOW YELLOW   APPearance CLEAR CLEAR   Specific Gravity, Urine 1.010 1.005 - 1.030   pH 5.5 5.0 - 8.0   Glucose, UA NEGATIVE NEGATIVE mg/dL   Hgb urine dipstick NEGATIVE NEGATIVE   Bilirubin Urine NEGATIVE NEGATIVE   Ketones, ur NEGATIVE NEGATIVE mg/dL   Protein, ur NEGATIVE NEGATIVE mg/dL   Nitrite NEGATIVE NEGATIVE   Leukocytes, UA SMALL (A) NEGATIVE  Protime-INR   Result Value Ref Range   Prothrombin Time 18.2 (H) 11.6 - 15.2 seconds   INR 1.50 (H) 0.00 - 1.49  Ammonia  Result Value Ref Range   Ammonia 138 (H) 9 - 35 umol/L  Magnesium  Result Value Ref Range   Magnesium 1.6 (L) 1.7 - 2.4 mg/dL  Urine microscopic-add on  Result Value Ref Range   Squamous Epithelial / LPF 0-5 (A) NONE SEEN   WBC, UA 0-5 0 - 5 WBC/hpf   RBC / HPF NONE SEEN 0 - 5 RBC/hpf   Bacteria, UA RARE (A) NONE SEEN  Comprehensive metabolic panel  Result Value Ref Range   Sodium 132 (L) 135 - 145 mmol/L   Potassium 2.3 (LL) 3.5 - 5.1 mmol/L   Chloride 113 (H) 101 - 111 mmol/L   CO2 15 (L) 22 - 32 mmol/L   Glucose, Bld 168 (H) 65 - 99 mg/dL   BUN 40 (H) 6 - 20 mg/dL   Creatinine, Ser 9.56 (H) 0.44 - 1.00 mg/dL   Calcium 8.5 (L) 8.9 - 10.3 mg/dL   Total Protein 4.6 (L) 6.5 - 8.1 g/dL   Albumin 2.3 (L) 3.5 - 5.0 g/dL   AST 24 15 - 41 U/L   ALT 20 14 - 54 U/L   Alkaline Phosphatase 107 38 - 126 U/L   Total Bilirubin 1.0 0.3 - 1.2 mg/dL   GFR calc non Af Amer 30 (L) >60 mL/min   GFR calc Af Amer 35 (L) >60 mL/min   Anion gap 4 (L) 5 - 15  CBC WITH DIFFERENTIAL  Result Value Ref Range   WBC 4.6 4.0 - 10.5 K/uL   RBC 2.79 (L) 3.87 - 5.11 MIL/uL   Hemoglobin 8.3 (L) 12.0 - 15.0 g/dL   HCT 16.1 (L) 09.6 - 04.5 %   MCV 89.2 78.0 - 100.0 fL   MCH 29.7 26.0 - 34.0 pg   MCHC 33.3 30.0 - 36.0 g/dL   RDW 40.9 (H) 81.1 - 91.4 %   Platelets 93 (L) 150 - 400 K/uL   Neutrophils Relative % 50 %   Neutro Abs 2.3 1.7 - 7.7 K/uL   Lymphocytes Relative 38 %   Lymphs Abs 1.7 0.7 - 4.0 K/uL   Monocytes Relative 8 %   Monocytes Absolute 0.4 0.1 - 1.0 K/uL   Eosinophils Relative 2 %   Eosinophils Absolute 0.1 0.0 - 0.7 K/uL   Basophils Relative 1 %   Basophils Absolute 0.0 0.0 - 0.1 K/uL  Ammonia  Result Value Ref Range   Ammonia 132 (H) 9 - 35 umol/L  Magnesium  Result Value Ref Range   Magnesium 2.1 1.7 - 2.4 mg/dL  Glucose, capillary  Result Value Ref Range    Glucose-Capillary 141 (H) 65 - 99 mg/dL  Potassium  Result Value Ref Range   Potassium >7.5 (HH) 3.5 - 5.1 mmol/L  Magnesium  Result Value Ref Range   Magnesium 2.0 1.7 - 2.4 mg/dL  Protime-INR  Result Value Ref Range   Prothrombin Time 19.5 (H) 11.6 - 15.2 seconds   INR 1.65 (H) 0.00 - 1.49  CBC  Result Value Ref Range   WBC 5.2 4.0 - 10.5 K/uL   RBC 2.75 (L) 3.87 - 5.11 MIL/uL   Hemoglobin 8.2 (L) 12.0 - 15.0 g/dL   HCT 78.2 (L) 95.6 - 21.3 %   MCV 89.5 78.0 - 100.0 fL   MCH 29.8 26.0 - 34.0 pg   MCHC 33.3 30.0 - 36.0 g/dL   RDW 08.6 (H) 57.8 - 46.9 %   Platelets 90 (L) 150 - 400 K/uL  Basic metabolic panel  Result Value Ref Range   Sodium 132 (L) 135 - 145 mmol/L   Potassium 3.7 3.5 - 5.1 mmol/L   Chloride 115 (H) 101 - 111 mmol/L   CO2 15 (L) 22 - 32 mmol/L   Glucose, Bld 131 (H) 65 - 99 mg/dL   BUN 40 (H) 6 - 20 mg/dL   Creatinine, Ser 6.29 (H) 0.44 - 1.00 mg/dL   Calcium 8.4 (L) 8.9 - 10.3 mg/dL   GFR calc non Af Amer 31 (L) >60 mL/min   GFR calc Af Amer 35 (L) >60 mL/min  Basic metabolic panel  Result Value Ref Range   Sodium 131 (L) 135 - 145 mmol/L   Potassium 3.2 (L) 3.5 - 5.1 mmol/L   Chloride 115 (H) 101 - 111 mmol/L   CO2 14 (L) 22 - 32 mmol/L   Glucose, Bld 150 (H) 65 - 99 mg/dL   BUN 37 (H) 6 - 20 mg/dL   Creatinine, Ser 5.28 (H) 0.44 - 1.00 mg/dL   Calcium 8.2 (L) 8.9 - 10.3 mg/dL   GFR calc non Af Amer 28 (L) >60 mL/min   GFR calc Af Amer 33 (L) >60 mL/min   Anion gap 2 (L) 5 - 15  Glucose, capillary  Result Value Ref Range   Glucose-Capillary 157 (H) 65 - 99 mg/dL  Renal function panel  Result Value Ref Range   Sodium 134 (L) 135 - 145  mmol/L   Potassium 3.1 (L) 3.5 - 5.1 mmol/L   Chloride 115 (H) 101 - 111 mmol/L   CO2 15 (L) 22 - 32 mmol/L   Glucose, Bld 101 (H) 65 - 99 mg/dL   BUN 47 (H) 6 - 20 mg/dL   Creatinine, Ser 4.09 (H) 0.44 - 1.00 mg/dL   Calcium 8.7 (L) 8.9 - 10.3 mg/dL   Phosphorus 3.4 2.5 - 4.6 mg/dL   Albumin 2.6 (L) 3.5 -  5.0 g/dL   GFR calc non Af Amer 31 (L) >60 mL/min   GFR calc Af Amer 36 (L) >60 mL/min   Anion gap 4 (L) 5 - 15  AFP tumor marker  Result Value Ref Range   AFP-Tumor Marker 3.5 0.0 - 8.3 ng/mL  Glucose, capillary  Result Value Ref Range   Glucose-Capillary 104 (H) 65 - 99 mg/dL   Comment 1 Notify RN   Comprehensive metabolic panel  Result Value Ref Range   Sodium 135 135 - 145 mmol/L   Potassium 3.5 3.5 - 5.1 mmol/L   Chloride 116 (H) 101 - 111 mmol/L   CO2 14 (L) 22 - 32 mmol/L   Glucose, Bld 114 (H) 65 - 99 mg/dL   BUN 47 (H) 6 - 20 mg/dL   Creatinine, Ser 8.11 (H) 0.44 - 1.00 mg/dL   Calcium 8.7 (L) 8.9 - 10.3 mg/dL   Total Protein 4.7 (L) 6.5 - 8.1 g/dL   Albumin 2.8 (L) 3.5 - 5.0 g/dL   AST 19 15 - 41 U/L   ALT 14 14 - 54 U/L   Alkaline Phosphatase 89 38 - 126 U/L   Total Bilirubin 1.0 0.3 - 1.2 mg/dL   GFR calc non Af Amer 31 (L) >60 mL/min   GFR calc Af Amer 36 (L) >60 mL/min   Anion gap 5 5 - 15  CBC  Result Value Ref Range   WBC 3.4 (L) 4.0 - 10.5 K/uL   RBC 2.40 (L) 3.87 - 5.11 MIL/uL   Hemoglobin 7.2 (L) 12.0 - 15.0 g/dL   HCT 91.4 (L) 78.2 - 95.6 %   MCV 91.3 78.0 - 100.0 fL   MCH 30.0 26.0 - 34.0 pg   MCHC 32.9 30.0 - 36.0 g/dL   RDW 21.3 (H) 08.6 - 57.8 %   Platelets 70 (L) 150 - 400 K/uL  Magnesium  Result Value Ref Range   Magnesium 1.7 1.7 - 2.4 mg/dL  Iron and TIBC  Result Value Ref Range   Iron 153 28 - 170 ug/dL   TIBC 469 (L) 629 - 528 ug/dL   Saturation Ratios 89 (H) 10.4 - 31.8 %   UIBC 19 ug/dL  Ferritin  Result Value Ref Range   Ferritin 245 11 - 307 ng/mL  Vitamin B12  Result Value Ref Range   Vitamin B-12 753 180 - 914 pg/mL  Folate  Result Value Ref Range   Folate 23.3 >5.9 ng/mL  Reticulocytes  Result Value Ref Range   Retic Ct Pct 5.2 (H) 0.4 - 3.1 %   RBC. 2.40 (L) 3.87 - 5.11 MIL/uL   Retic Count, Manual 124.8 19.0 - 186.0 K/uL  Glucose, capillary  Result Value Ref Range   Glucose-Capillary 104 (H) 65 - 99 mg/dL    Comprehensive metabolic panel  Result Value Ref Range   Sodium 134 (L) 135 - 145 mmol/L   Potassium 3.0 (L) 3.5 - 5.1 mmol/L   Chloride 114 (H) 101 - 111 mmol/L   CO2  14 (L) 22 - 32 mmol/L   Glucose, Bld 112 (H) 65 - 99 mg/dL   BUN 47 (H) 6 - 20 mg/dL   Creatinine, Ser 1.61 (H) 0.44 - 1.00 mg/dL   Calcium 8.6 (L) 8.9 - 10.3 mg/dL   Total Protein 4.9 (L) 6.5 - 8.1 g/dL   Albumin 3.0 (L) 3.5 - 5.0 g/dL   AST 20 15 - 41 U/L   ALT 14 14 - 54 U/L   Alkaline Phosphatase 107 38 - 126 U/L   Total Bilirubin 1.1 0.3 - 1.2 mg/dL   GFR calc non Af Amer 27 (L) >60 mL/min   GFR calc Af Amer 32 (L) >60 mL/min   Anion gap 6 5 - 15  CBC  Result Value Ref Range   WBC 4.1 4.0 - 10.5 K/uL   RBC 2.37 (L) 3.87 - 5.11 MIL/uL   Hemoglobin 7.1 (L) 12.0 - 15.0 g/dL   HCT 09.6 (L) 04.5 - 40.9 %   MCV 91.6 78.0 - 100.0 fL   MCH 30.0 26.0 - 34.0 pg   MCHC 32.7 30.0 - 36.0 g/dL   RDW 81.1 (H) 91.4 - 78.2 %   Platelets 68 (L) 150 - 400 K/uL  Renal function panel  Result Value Ref Range   Sodium 133 (L) 135 - 145 mmol/L   Potassium 3.0 (L) 3.5 - 5.1 mmol/L   Chloride 113 (H) 101 - 111 mmol/L   CO2 15 (L) 22 - 32 mmol/L   Glucose, Bld 112 (H) 65 - 99 mg/dL   BUN 46 (H) 6 - 20 mg/dL   Creatinine, Ser 9.56 (H) 0.44 - 1.00 mg/dL   Calcium 8.6 (L) 8.9 - 10.3 mg/dL   Phosphorus 3.6 2.5 - 4.6 mg/dL   Albumin 3.0 (L) 3.5 - 5.0 g/dL   GFR calc non Af Amer 27 (L) >60 mL/min   GFR calc Af Amer 31 (L) >60 mL/min   Anion gap 5 5 - 15  Magnesium  Result Value Ref Range   Magnesium 1.8 1.7 - 2.4 mg/dL  Glucose, capillary  Result Value Ref Range   Glucose-Capillary 102 (H) 65 - 99 mg/dL  Haptoglobin  Result Value Ref Range   Haptoglobin <10 (L) 34 - 200 mg/dL  Comprehensive metabolic panel  Result Value Ref Range   Sodium 135 135 - 145 mmol/L   Potassium 3.6 3.5 - 5.1 mmol/L   Chloride 115 (H) 101 - 111 mmol/L   CO2 16 (L) 22 - 32 mmol/L   Glucose, Bld 101 (H) 65 - 99 mg/dL   BUN 49 (H) 6 - 20 mg/dL    Creatinine, Ser 2.13 (H) 0.44 - 1.00 mg/dL   Calcium 8.8 (L) 8.9 - 10.3 mg/dL   Total Protein 4.8 (L) 6.5 - 8.1 g/dL   Albumin 3.1 (L) 3.5 - 5.0 g/dL   AST 19 15 - 41 U/L   ALT 13 (L) 14 - 54 U/L   Alkaline Phosphatase 101 38 - 126 U/L   Total Bilirubin 1.1 0.3 - 1.2 mg/dL   GFR calc non Af Amer 30 (L) >60 mL/min   GFR calc Af Amer 35 (L) >60 mL/min   Anion gap 4 (L) 5 - 15  Magnesium  Result Value Ref Range   Magnesium 1.8 1.7 - 2.4 mg/dL  CBC  Result Value Ref Range   WBC 3.1 (L) 4.0 - 10.5 K/uL   RBC 2.52 (L) 3.87 - 5.11 MIL/uL   Hemoglobin 7.6 (L) 12.0 -  15.0 g/dL   HCT 16.1 (L) 09.6 - 04.5 %   MCV 92.9 78.0 - 100.0 fL   MCH 30.2 26.0 - 34.0 pg   MCHC 32.5 30.0 - 36.0 g/dL   RDW 40.9 (H) 81.1 - 91.4 %   Platelets 56 (L) 150 - 400 K/uL  Renal function panel  Result Value Ref Range   Sodium 135 135 - 145 mmol/L   Potassium 3.6 3.5 - 5.1 mmol/L   Chloride 114 (H) 101 - 111 mmol/L   CO2 16 (L) 22 - 32 mmol/L   Glucose, Bld 102 (H) 65 - 99 mg/dL   BUN 48 (H) 6 - 20 mg/dL   Creatinine, Ser 7.82 (H) 0.44 - 1.00 mg/dL   Calcium 8.7 (L) 8.9 - 10.3 mg/dL   Phosphorus 3.6 2.5 - 4.6 mg/dL   Albumin 3.0 (L) 3.5 - 5.0 g/dL   GFR calc non Af Amer 29 (L) >60 mL/min   GFR calc Af Amer 34 (L) >60 mL/min   Anion gap 5 5 - 15  Ammonia  Result Value Ref Range   Ammonia 56 (H) 9 - 35 umol/L  Hemoglobin and hematocrit, blood  Result Value Ref Range   Hemoglobin 8.0 (L) 12.0 - 15.0 g/dL   HCT 95.6 (L) 21.3 - 08.6 %  Glucose, capillary  Result Value Ref Range   Glucose-Capillary 100 (H) 65 - 99 mg/dL  CBC  Result Value Ref Range   WBC 3.2 (L) 4.0 - 10.5 K/uL   RBC 2.52 (L) 3.87 - 5.11 MIL/uL   Hemoglobin 7.6 (L) 12.0 - 15.0 g/dL   HCT 57.8 (L) 46.9 - 62.9 %   MCV 92.9 78.0 - 100.0 fL   MCH 30.2 26.0 - 34.0 pg   MCHC 32.5 30.0 - 36.0 g/dL   RDW 52.8 (H) 41.3 - 24.4 %   Platelets 60 (L) 150 - 400 K/uL  Renal function panel  Result Value Ref Range   Sodium 134 (L) 135 - 145  mmol/L   Potassium 3.3 (L) 3.5 - 5.1 mmol/L   Chloride 114 (H) 101 - 111 mmol/L   CO2 17 (L) 22 - 32 mmol/L   Glucose, Bld 153 (H) 65 - 99 mg/dL   BUN 49 (H) 6 - 20 mg/dL   Creatinine, Ser 0.10 (H) 0.44 - 1.00 mg/dL   Calcium 8.6 (L) 8.9 - 10.3 mg/dL   Phosphorus 3.6 2.5 - 4.6 mg/dL   Albumin 2.9 (L) 3.5 - 5.0 g/dL   GFR calc non Af Amer 27 (L) >60 mL/min   GFR calc Af Amer 32 (L) >60 mL/min   Anion gap 3 (L) 5 - 15  Lactate dehydrogenase  Result Value Ref Range   LDH 137 98 - 192 U/L  Glucose, capillary  Result Value Ref Range   Glucose-Capillary 103 (H) 65 - 99 mg/dL   Comment 1 Notify RN   CBC with Differential/Platelet  Result Value Ref Range   WBC 2.9 (L) 4.0 - 10.5 K/uL   RBC 2.53 (L) 3.87 - 5.11 MIL/uL   Hemoglobin 7.7 (L) 12.0 - 15.0 g/dL   HCT 27.2 (L) 53.6 - 64.4 %   MCV 93.3 78.0 - 100.0 fL   MCH 30.4 26.0 - 34.0 pg   MCHC 32.6 30.0 - 36.0 g/dL   RDW 03.4 (H) 74.2 - 59.5 %   Platelets 51 (L) 150 - 400 K/uL   Neutrophils Relative % 52 %   Neutro Abs 1.5 (L) 1.7 - 7.7  K/uL   Lymphocytes Relative 37 %   Lymphs Abs 1.1 0.7 - 4.0 K/uL   Monocytes Relative 9 %   Monocytes Absolute 0.3 0.1 - 1.0 K/uL   Eosinophils Relative 2 %   Eosinophils Absolute 0.1 0.0 - 0.7 K/uL   Basophils Relative 1 %   Basophils Absolute 0.0 0.0 - 0.1 K/uL  Protime-INR  Result Value Ref Range   Prothrombin Time 20.7 (H) 11.6 - 15.2 seconds   INR 1.78 (H) 0.00 - 1.49  Fibrinogen  Result Value Ref Range   Fibrinogen 134 (L) 204 - 475 mg/dL  Technologist smear review  Result Value Ref Range   Tech Review ATYPICAL LYMPHOCYTES   CBC with Differential/Platelet  Result Value Ref Range   WBC 2.6 (L) 4.0 - 10.5 K/uL   RBC 2.66 (L) 3.87 - 5.11 MIL/uL   Hemoglobin 8.1 (L) 12.0 - 15.0 g/dL   HCT 16.1 (L) 09.6 - 04.5 %   MCV 93.2 78.0 - 100.0 fL   MCH 30.5 26.0 - 34.0 pg   MCHC 32.7 30.0 - 36.0 g/dL   RDW 40.9 (H) 81.1 - 91.4 %   Platelets 55 (L) 150 - 400 K/uL   Neutrophils Relative % 51  %   Neutro Abs 1.3 (L) 1.7 - 7.7 K/uL   Lymphocytes Relative 36 %   Lymphs Abs 0.9 0.7 - 4.0 K/uL   Monocytes Relative 10 %   Monocytes Absolute 0.3 0.1 - 1.0 K/uL   Eosinophils Relative 3 %   Eosinophils Absolute 0.1 0.0 - 0.7 K/uL   Basophils Relative 0 %   Basophils Absolute 0.0 0.0 - 0.1 K/uL  Comprehensive metabolic panel  Result Value Ref Range   Sodium 134 (L) 135 - 145 mmol/L   Potassium 3.4 (L) 3.5 - 5.1 mmol/L   Chloride 111 101 - 111 mmol/L   CO2 18 (L) 22 - 32 mmol/L   Glucose, Bld 89 65 - 99 mg/dL   BUN 51 (H) 6 - 20 mg/dL   Creatinine, Ser 7.82 (H) 0.44 - 1.00 mg/dL   Calcium 8.6 (L) 8.9 - 10.3 mg/dL   Total Protein 4.8 (L) 6.5 - 8.1 g/dL   Albumin 2.9 (L) 3.5 - 5.0 g/dL   AST 18 15 - 41 U/L   ALT 12 (L) 14 - 54 U/L   Alkaline Phosphatase 82 38 - 126 U/L   Total Bilirubin 1.4 (H) 0.3 - 1.2 mg/dL   GFR calc non Af Amer 31 (L) >60 mL/min   GFR calc Af Amer 36 (L) >60 mL/min   Anion gap 5 5 - 15  Glucose, capillary  Result Value Ref Range   Glucose-Capillary 77 65 - 99 mg/dL   Comment 1 Notify RN   Type and screen [facility]  Result Value Ref Range   ABO/RH(D) O NEG    Antibody Screen NEG    Sample Expiration 07/01/2015    Unit Number [id number]    Blood Component Type RBC LR PHER2    Unit division 00    Status of Unit ISSUED,FINAL    Transfusion Status OK TO TRANSFUSE    Crossmatch Result COMPATIBLE    Unit Number [id number]    Blood Component Type RBC LR PHER1    Unit division 00    Status of Unit REL FROM [facility]    Transfusion Status OK TO TRANSFUSE    Crossmatch Result COMPATIBLE   Prepare RBC  Result Value Ref Range   Order Confirmation ORDER  PROCESSED BY BLOOD BANK   Direct antiglobulin test (not at [facility])  Result Value Ref Range   DAT, complement NEG    DAT, IgG NEG Performed at [facility]        Assessment & Plan:   Problem List Items Addressed This Visit      Cardiovascular and Mediastinum   Hypotension      Digestive   Hepatic cirrhosis (HCC) - Primary    Other Visit Diagnoses    Acute cystitis without hematuria        Relevant Medications    ciprofloxacin (CIPRO) 250 MG tablet    Other Relevant Orders    Urinalysis, Complete    Urine culture       Follow up plan: Return in about 4 weeks (around 08/04/2015), or if symptoms worsen or fail to improve, for Follow-up cirrhosis and swelling.  Counseling provided for all of the vaccine components Orders Placed This Encounter  Procedures  . Urinalysis, Complete    Arville Care, MD [facility] 07/07/2015, 11:14 AM

## 2015-07-13 ENCOUNTER — Encounter (HOSPITAL_COMMUNITY): Payer: Self-pay | Admitting: Emergency Medicine

## 2015-07-13 ENCOUNTER — Inpatient Hospital Stay (HOSPITAL_COMMUNITY)
Admission: EM | Admit: 2015-07-13 | Discharge: 2015-07-18 | DRG: 683 | Disposition: A | Discharge status: Home / Self Care | Payer: Medicare HMO | Attending: Internal Medicine | Admitting: Internal Medicine

## 2015-07-13 DIAGNOSIS — N179 Acute kidney failure, unspecified: Principal | ICD-10-CM | POA: Diagnosis present

## 2015-07-13 DIAGNOSIS — N183 Chronic kidney disease, stage 3 unspecified: Secondary | ICD-10-CM | POA: Diagnosis present

## 2015-07-13 DIAGNOSIS — K5090 Crohn's disease, unspecified, without complications: Secondary | ICD-10-CM | POA: Diagnosis present

## 2015-07-13 DIAGNOSIS — R0600 Dyspnea, unspecified: Secondary | ICD-10-CM

## 2015-07-13 DIAGNOSIS — R0602 Shortness of breath: Secondary | ICD-10-CM

## 2015-07-13 DIAGNOSIS — D6959 Other secondary thrombocytopenia: Secondary | ICD-10-CM | POA: Diagnosis present

## 2015-07-13 DIAGNOSIS — I129 Hypertensive chronic kidney disease with stage 1 through stage 4 chronic kidney disease, or unspecified chronic kidney disease: Secondary | ICD-10-CM | POA: Diagnosis present

## 2015-07-13 DIAGNOSIS — Z932 Ileostomy status: Secondary | ICD-10-CM

## 2015-07-13 DIAGNOSIS — T502X5A Adverse effect of carbonic-anhydrase inhibitors, benzothiadiazides and other diuretics, initial encounter: Secondary | ICD-10-CM | POA: Diagnosis present

## 2015-07-13 DIAGNOSIS — R7989 Other specified abnormal findings of blood chemistry: Secondary | ICD-10-CM | POA: Insufficient documentation

## 2015-07-13 DIAGNOSIS — E871 Hypo-osmolality and hyponatremia: Secondary | ICD-10-CM

## 2015-07-13 DIAGNOSIS — K7460 Unspecified cirrhosis of liver: Secondary | ICD-10-CM | POA: Diagnosis present

## 2015-07-13 DIAGNOSIS — Z809 Family history of malignant neoplasm, unspecified: Secondary | ICD-10-CM

## 2015-07-13 DIAGNOSIS — R531 Weakness: Secondary | ICD-10-CM | POA: Diagnosis not present

## 2015-07-13 DIAGNOSIS — E876 Hypokalemia: Secondary | ICD-10-CM | POA: Diagnosis present

## 2015-07-13 DIAGNOSIS — Z823 Family history of stroke: Secondary | ICD-10-CM

## 2015-07-13 DIAGNOSIS — Z933 Colostomy status: Secondary | ICD-10-CM

## 2015-07-13 DIAGNOSIS — Z66 Do not resuscitate: Secondary | ICD-10-CM | POA: Diagnosis present

## 2015-07-13 DIAGNOSIS — E861 Hypovolemia: Secondary | ICD-10-CM | POA: Diagnosis present

## 2015-07-13 NOTE — ED Notes (Signed)
Pt c/o increased sob that started today. 

## 2015-07-14 ENCOUNTER — Observation Stay (HOSPITAL_COMMUNITY): Payer: Medicare HMO

## 2015-07-14 ENCOUNTER — Emergency Department (HOSPITAL_COMMUNITY): Payer: Medicare HMO

## 2015-07-14 ENCOUNTER — Encounter (HOSPITAL_COMMUNITY): Payer: Self-pay | Admitting: *Deleted

## 2015-07-14 DIAGNOSIS — R0600 Dyspnea, unspecified: Secondary | ICD-10-CM | POA: Insufficient documentation

## 2015-07-14 DIAGNOSIS — R791 Abnormal coagulation profile: Secondary | ICD-10-CM | POA: Diagnosis not present

## 2015-07-14 DIAGNOSIS — N183 Chronic kidney disease, stage 3 (moderate): Secondary | ICD-10-CM | POA: Diagnosis not present

## 2015-07-14 DIAGNOSIS — R7989 Other specified abnormal findings of blood chemistry: Secondary | ICD-10-CM | POA: Insufficient documentation

## 2015-07-14 DIAGNOSIS — E871 Hypo-osmolality and hyponatremia: Secondary | ICD-10-CM | POA: Diagnosis not present

## 2015-07-14 LAB — CBC
HCT: 24.4 % — ABNORMAL LOW (ref 36.0–46.0)
Hemoglobin: 8.5 g/dL — ABNORMAL LOW (ref 12.0–15.0)
MCH: 30.6 pg (ref 26.0–34.0)
MCHC: 34.8 g/dL (ref 30.0–36.0)
MCV: 87.8 fL (ref 78.0–100.0)
PLATELETS: 98 10*3/uL — AB (ref 150–400)
RBC: 2.78 MIL/uL — ABNORMAL LOW (ref 3.87–5.11)
RDW: 16.8 % — AB (ref 11.5–15.5)
WBC: 6.5 10*3/uL (ref 4.0–10.5)

## 2015-07-14 LAB — CBC WITH DIFFERENTIAL/PLATELET
BASOS ABS: 0 10*3/uL (ref 0.0–0.1)
Basophils Relative: 0 %
EOS ABS: 0.1 10*3/uL (ref 0.0–0.7)
Eosinophils Relative: 2 %
HCT: 25.2 % — ABNORMAL LOW (ref 36.0–46.0)
Hemoglobin: 9 g/dL — ABNORMAL LOW (ref 12.0–15.0)
Lymphocytes Relative: 19 %
Lymphs Abs: 1.4 10*3/uL (ref 0.7–4.0)
MCH: 31.4 pg (ref 26.0–34.0)
MCHC: 35.7 g/dL (ref 30.0–36.0)
MCV: 87.8 fL (ref 78.0–100.0)
MONOS PCT: 7 %
Monocytes Absolute: 0.5 10*3/uL (ref 0.1–1.0)
NEUTROS ABS: 5.3 10*3/uL (ref 1.7–7.7)
NEUTROS PCT: 72 %
PLATELETS: 91 10*3/uL — AB (ref 150–400)
RBC: 2.87 MIL/uL — ABNORMAL LOW (ref 3.87–5.11)
RDW: 16.6 % — AB (ref 11.5–15.5)
SMEAR REVIEW: DECREASED
WBC: 7.4 10*3/uL (ref 4.0–10.5)

## 2015-07-14 LAB — COMPREHENSIVE METABOLIC PANEL
ALT: 21 U/L (ref 14–54)
ALT: 21 U/L (ref 14–54)
AST: 27 U/L (ref 15–41)
AST: 32 U/L (ref 15–41)
Albumin: 3.3 g/dL — ABNORMAL LOW (ref 3.5–5.0)
Albumin: 2.9 g/dL — ABNORMAL LOW (ref 3.5–5.0)
Alkaline Phosphatase: 170 U/L — ABNORMAL HIGH (ref 38–126)
Alkaline Phosphatase: 147 U/L — ABNORMAL HIGH (ref 38–126)
Anion gap: 8 (ref 5–15)
Anion gap: 8 (ref 5–15)
BUN: 80 mg/dL — AB (ref 6–20)
BUN: 83 mg/dL — AB (ref 6–20)
CHLORIDE: 101 mmol/L (ref 101–111)
CHLORIDE: 99 mmol/L — AB (ref 101–111)
CO2: 12 mmol/L — AB (ref 22–32)
CO2: 12 mmol/L — AB (ref 22–32)
Calcium: 8.5 mg/dL — ABNORMAL LOW (ref 8.9–10.3)
Calcium: 8.4 mg/dL — ABNORMAL LOW (ref 8.9–10.3)
Creatinine, Ser: 2.34 mg/dL — ABNORMAL HIGH (ref 0.44–1.00)
Creatinine, Ser: 2.1 mg/dL — ABNORMAL HIGH (ref 0.44–1.00)
GFR, EST AFRICAN AMERICAN: 25 mL/min — AB (ref >60)
GFR, EST AFRICAN AMERICAN: 29 mL/min — AB (ref >60)
GFR, EST NON AFRICAN AMERICAN: 22 mL/min — AB (ref >60)
GFR, EST NON AFRICAN AMERICAN: 25 mL/min — AB (ref >60)
Glucose, Bld: 132 mg/dL — ABNORMAL HIGH (ref 65–99)
Glucose, Bld: 106 mg/dL — ABNORMAL HIGH (ref 65–99)
POTASSIUM: 3.3 mmol/L — AB (ref 3.5–5.1)
POTASSIUM: 3.5 mmol/L (ref 3.5–5.1)
SODIUM: 119 mmol/L — AB (ref 135–145)
SODIUM: 121 mmol/L — AB (ref 135–145)
Total Bilirubin: 1.3 mg/dL — ABNORMAL HIGH (ref 0.3–1.2)
Total Bilirubin: 1.4 mg/dL — ABNORMAL HIGH (ref 0.3–1.2)
Total Protein: 5.8 g/dL — ABNORMAL LOW (ref 6.5–8.1)
Total Protein: 5.2 g/dL — ABNORMAL LOW (ref 6.5–8.1)

## 2015-07-14 LAB — OSMOLALITY: OSMOLALITY: 285 mosm/kg (ref 275–295)

## 2015-07-14 LAB — D-DIMER, QUANTITATIVE: D-Dimer, Quant: 2.08 ug/mL-FEU — ABNORMAL HIGH (ref 0.00–0.50)

## 2015-07-14 LAB — TROPONIN I: TROPONIN I: 0.03 ng/mL (ref <0.031)

## 2015-07-14 LAB — BRAIN NATRIURETIC PEPTIDE: B NATRIURETIC PEPTIDE 5: 44 pg/mL (ref 0.0–100.0)

## 2015-07-14 MED ORDER — MIDODRINE HCL 5 MG PO TABS
5.0000 mg | ORAL_TABLET | Freq: Three times a day (TID) | ORAL | Status: DC
  Administered 2015-07-14 – 2015-07-18 (×12): 5 mg via ORAL
  Filled 2015-07-14 (×12): qty 1

## 2015-07-14 MED ORDER — ENSURE ENLIVE PO LIQD
237.0000 mL | ORAL | Status: DC
  Administered 2015-07-15: 237 mL via ORAL

## 2015-07-14 MED ORDER — PANTOPRAZOLE SODIUM 40 MG PO TBEC
40.0000 mg | DELAYED_RELEASE_TABLET | Freq: Every day | ORAL | Status: DC
Start: 2015-07-14 — End: 2015-07-18
  Administered 2015-07-14 – 2015-07-18 (×6): 40 mg via ORAL
  Filled 2015-07-14 (×5): qty 1

## 2015-07-14 MED ORDER — ONDANSETRON HCL 4 MG PO TABS: 4.0000 mg | ORAL_TABLET | Freq: Four times a day (QID) | ORAL | Status: DC | PRN

## 2015-07-14 MED ORDER — TECHNETIUM TC 99M DIETHYLENETRIAME-PENTAACETIC ACID
30.0000 | Freq: Once | INTRAVENOUS | Status: AC | PRN
  Administered 2015-07-14: 33 via RESPIRATORY_TRACT

## 2015-07-14 MED ORDER — RIFAXIMIN 550 MG PO TABS
550.0000 mg | ORAL_TABLET | Freq: Two times a day (BID) | ORAL | Status: DC
  Administered 2015-07-14 – 2015-07-18 (×9): 550 mg via ORAL
  Filled 2015-07-14 (×9): qty 1

## 2015-07-14 MED ORDER — GABAPENTIN 100 MG PO CAPS
100.0000 mg | ORAL_CAPSULE | Freq: Three times a day (TID) | ORAL | Status: DC
  Administered 2015-07-14 – 2015-07-18 (×12): 100 mg via ORAL
  Filled 2015-07-14 (×12): qty 1

## 2015-07-14 MED ORDER — LACTULOSE 10 GM/15ML PO SOLN
30.0000 g | Freq: Three times a day (TID) | ORAL | Status: DC
  Administered 2015-07-14 (×3): 30 g via ORAL
  Administered 2015-07-15: 40 g via ORAL
  Administered 2015-07-15 – 2015-07-18 (×6): 30 g via ORAL
  Filled 2015-07-14 (×12): qty 60

## 2015-07-14 MED ORDER — SODIUM CHLORIDE 0.9% FLUSH
3.0000 mL | Freq: Two times a day (BID) | INTRAVENOUS | Status: DC
  Administered 2015-07-14 – 2015-07-18 (×6): 3 mL via INTRAVENOUS

## 2015-07-14 MED ORDER — ONDANSETRON HCL 4 MG/2ML IJ SOLN: 4.0000 mg | Freq: Four times a day (QID) | INTRAMUSCULAR | Status: DC | PRN

## 2015-07-14 MED ORDER — POTASSIUM CHLORIDE IN NACL 20-0.9 MEQ/L-% IV SOLN
INTRAVENOUS | Status: DC
  Administered 2015-07-14 – 2015-07-17 (×3): via INTRAVENOUS

## 2015-07-14 MED ORDER — SODIUM CHLORIDE 0.9 % IV BOLUS (SEPSIS)
500.0000 mL | Freq: Once | INTRAVENOUS | Status: AC
  Administered 2015-07-14: 500 mL via INTRAVENOUS

## 2015-07-14 MED ORDER — TECHNETIUM TO 99M ALBUMIN AGGREGATED
4.0000 | Freq: Once | INTRAVENOUS | Status: AC | PRN
  Administered 2015-07-14: 4.4 via INTRAVENOUS

## 2015-07-14 MED ORDER — MESALAMINE 1000 MG RE SUPP
1000.0000 mg | Freq: Every day | RECTAL | Status: DC
  Administered 2015-07-15: 1000 mg via RECTAL
  Filled 2015-07-14 (×6): qty 1

## 2015-07-14 MED ORDER — PRO-STAT SUGAR FREE PO LIQD
30.0000 mL | Freq: Three times a day (TID) | ORAL | Status: DC
  Administered 2015-07-14 – 2015-07-18 (×11): 30 mL via ORAL
  Filled 2015-07-14 (×12): qty 30

## 2015-07-14 MED ORDER — SODIUM CHLORIDE 0.9 % IV SOLN
INTRAVENOUS | Status: DC
  Administered 2015-07-14: 05:00:00 via INTRAVENOUS

## 2015-07-14 MED ORDER — ALPRAZOLAM 0.5 MG PO TABS
0.5000 mg | ORAL_TABLET | Freq: Every evening | ORAL | Status: DC | PRN
  Administered 2015-07-14 – 2015-07-17 (×4): 0.5 mg via ORAL
  Filled 2015-07-14 (×4): qty 1

## 2015-07-14 NOTE — H&P (Signed)
TRH H&P   Patient Demographics:    Kathryn [name], is a 60 y.o. female  MRN: [id number]   DOB - 1955-09-27  Admit Date - 07/13/2015  Outpatient Primary MD for the patient is Nils Pyle, MD  Referring MD/NP/PA: Dr Wilkie Aye  Patient coming from: Home  Chief Complaint  Patient presents with  . Shortness of Breath      HPI:    Kathryn [name]  is a 60 y.o. female, With history of hemosiderosis, Crohn disease, hepatic encephalopathy who was recently discharged from the hospital on 07/02/2015 after being treated for hepatic encephalopathy. The patient came to the hospital with shortness of breath, chest x-ray shows no acute abnormality. Revealed hyponatremia with sodium 119, also had elevated d-dimer 2.08. Patient denies nausea vomiting or diarrhea. No chest pain. No fever or chills. No dysuria. She has colostomy in place.    Review of systems:    In addition to the HPI above,  No Fever-chills, No Headache, No changes with Vision or hearing, No problems swallowing food or Liquids, + Shortness of Breath, No Abdominal pain, No Nausea or Vommitting, Bowel movements are regular, No Blood in stool or Urine, No dysuria, No new skin rashes or bruises, No new joints pains-aches,  No new weakness, tingling, numbness in any extremity, No recent weight gain or loss, No polyuria, polydypsia or polyphagia, No significant Mental Stressors.  A full 10 point Review of Systems was done, except as stated above, all other Review of Systems were negative.   With Past History of the following :    Past Medical History  Diagnosis Date  . Hypertension   . Enteritis presumed infectious   . Crohn's   . Anemia   . Hepatitis C antibody test positive   . Hepatitis   . Renal insufficiency   . Cirrhosis (HCC)   . Splenomegaly   . Bilateral leg edema 01/2014  . Chronic back pain   . Thrombocytopenia [facility])         Past Surgical History  Procedure Laterality Date  . Colon surgery      MULTIPLE SURGERIES FOR CROHNS  . Ileostomy      multiple abdominal surgeries for Crohn's by Dr. Gabriel Cirri  . Cholecystectomy    . Abscess drainage      abdominal  . Esophagogastroduodenoscopy  10/05/2010    Procedure: ESOPHAGOGASTRODUODENOSCOPY (EGD);  Surgeon: [name], MD;  [facility];  Service: Endoscopy;  Laterality: N/A;  7:30 am  . Cirrhosis    . Esophagogastroduodenoscopy N/A 09/11/2013    Procedure: ESOPHAGOGASTRODUODENOSCOPY (EGD);  Surgeon: [name], MD;  [facility];  Service: Endoscopy;  Laterality: N/A;  . Ileoscopy  09/11/2013    Procedure: ILEOSCOPY THROUGH STOMA;  Surgeon: [name], MD;  [facility];  Service: Endoscopy;;  . Givens capsule study N/A 09/12/2013    Procedure: GIVENS CAPSULE STUDY;  Surgeon: [name], MD;  [facility];  Service: Endoscopy;  Laterality: N/A;  .  Givens capsule study N/A 11/25/2013    Procedure: GIVENS CAPSULE STUDY;  Surgeon: [name], MD;  [facility];  Service: Endoscopy;  Laterality: N/A;  . Varices cauterization Right     in the stoma of her ileostomy  . Tips procedure  11/2013    [facility]   . Abdominal surgery        Social History:     Social History  Substance Use Topics  . Smoking status: Never Smoker   . Smokeless tobacco: Never Used  . Alcohol Use: No        Family History :     Family History  Problem Relation Age of Onset  . Cancer Mother   . Stroke Father       Home Medications:   Prior to Admission medications   Medication Sig Start Date End Date Taking? Authorizing Provider  ALPRAZolam Prudy Feeler) 0.5 MG tablet Take 1 tablet (0.5 mg total) by mouth at bedtime as needed for anxiety. 07/02/15   Rolly Salter, MD  Amino Acids-Protein Hydrolys (FEEDING SUPPLEMENT, PRO-STAT SUGAR FREE 64,) LIQD Take 30 mLs by mouth 3 (three) times daily with meals. 07/02/15    Rolly Salter, MD  Cholecalciferol (VITAMIN D-1000 MAX ST) 1000 UNITS tablet Take 1,000 Units by mouth daily.    Historical Provider, MD  ciprofloxacin (CIPRO) 250 MG tablet Take 1 tablet (250 mg total) by mouth 2 (two) times daily. 07/07/15   Elige Radon Dettinger, MD  feeding supplement, ENSURE ENLIVE, (ENSURE ENLIVE) LIQD Take 237 mLs by mouth daily. 03/27/15   Standley Brooking, MD  gabapentin (NEURONTIN) 100 MG capsule Take 1 capsule (100 mg total) by mouth 3 (three) times daily. 04/23/15   Ernestina Penna, MD  lactulose (CHRONULAC) 10 GM/15ML solution Take 45 mLs (30 g total) by mouth 3 (three) times daily. 07/02/15   Rolly Salter, MD  mesalamine (CANASA) 1000 MG suppository Place 1 suppository (1,000 mg total) rectally at bedtime. 04/22/15   Erick Blinks, MD  midodrine (PROAMATINE) 5 MG tablet Take 1 tablet (5 mg total) by mouth 3 (three) times daily with meals. 07/02/15   Rolly Salter, MD  Multiple Vitamins-Minerals (MULTIVITAMIN WITH MINERALS) tablet Take 1 tablet by mouth daily.    Historical Provider, MD  pantoprazole (PROTONIX) 40 MG tablet TAKE 1 TABLET BY MOUTH DAILY 06/09/14   Len Blalock, NP  potassium chloride (KLOR-CON) 20 MEQ packet Take 40 mEq by mouth daily. 07/02/15   Rolly Salter, MD  Probiotic Product (PROBIOTIC PO) Take 1 tablet by mouth daily.    Historical Provider, MD  rifaximin (XIFAXAN) 550 MG TABS tablet Take 1 tablet (550 mg total) by mouth 2 (two) times daily. 07/02/15   Rolly Salter, MD  spironolactone (ALDACTONE) 25 MG tablet Take 2 tablets (50 mg total) by mouth daily. 07/02/15   Rolly Salter, MD  torsemide (DEMADEX) 20 MG tablet Take 3 tablets (60 mg total) by mouth daily. 07/02/15   Rolly Salter, MD     Allergies:     Allergies  Allergen Reactions  . Penicillins Rash    Has patient had a PCN reaction causing immediate rash, facial/tongue/throat swelling, SOB or lightheadedness with hypotension: Yes Has patient had a PCN reaction causing severe rash  involving mucus membranes or skin necrosis: No Has patient had a PCN reaction that required hospitalization No Has patient had a PCN reaction occurring within the last 10 years: No If all of the  above answers are "NO", then may proceed with Cephalosporin use.      Physical Exam:   Vitals  Blood pressure 106/47, pulse 73, temperature 98.7 F (37.1 C), temperature source Oral, resp. rate 18, height 5\' 5"  (1.651 m), weight 83.054 kg (183 lb 1.6 oz), SpO2 99 %.   1. General Caucasian female* lying in bed in NAD, cooperative with exam  2. Normal affect and insight, Not Suicidal or Homicidal, Awake Alert, Oriented X 3.  3. No F.N deficits, ALL C.Nerves Intact, Strength 5/5 all 4 extremities, Sensation intact all 4 extremities, Plantars down going.  4. Ears and Eyes appear Normal, Conjunctivae clear, PERRLA. Moist Oral Mucosa.  5. Supple Neck, No JVD, No cervical lymphadenopathy appriciated, No Carotid Bruits.  6. Symmetrical Chest wall movement, Good air movement bilaterally, CTAB.  7. RRR, No Gallops, Rubs or Murmurs, No Parasternal Heave.  8. Positive Bowel Sounds, Abdomen Soft, colostomy bag in place, No tenderness, No organomegaly appriciated,No rebound -guarding or rigidity.  9.  No Cyanosis, Normal Skin Turgor, No Skin Rash or Bruise.  10. Good muscle tone,  joints appear normal , no effusions, Normal ROM.      Data Review:    CBC  Recent Labs Lab 07/14/15 0052  WBC 7.4  HGB 9.0*  HCT 25.2*  PLT 91*  MCV 87.8  MCH 31.4  MCHC 35.7  RDW 16.6*  LYMPHSABS 1.4  MONOABS 0.5  EOSABS 0.1  BASOSABS 0.0   ------------------------------------------------------------------------------------------------------------------  Chemistries   Recent Labs Lab 07/14/15 0052  NA 119*  K 3.5  CL 99*  CO2 12*  GLUCOSE 132*  BUN 83*  CREATININE 2.34*  CALCIUM 8.5*  AST 32  ALT 21  ALKPHOS 170*  BILITOT 1.3*    -------------------------------------------------------------------------------------------------------------------  Recent Labs  07/14/15 0150  DDIMER 2.08*   -------------------------------------------------------------------------------------------------------------------  Cardiac Enzymes  Recent Labs Lab 07/14/15 0052  TROPONINI 0.03   ------------------------------------------------------------------------------------------------------------------    Component Value Date/Time   BNP 44.0 07/14/2015 0059     ---------------------------------------------------------------------------------------------------------------  Urinalysis    Component Value Date/Time   COLORURINE YELLOW 06/24/2015 1740   APPEARANCEUR Hazy* 07/07/2015 1034   APPEARANCEUR CLEAR 06/24/2015 1740   LABSPEC 1.010 06/24/2015 1740   PHURINE 5.5 06/24/2015 1740   GLUCOSEU Negative 07/07/2015 1034   HGBUR NEGATIVE 06/24/2015 1740   BILIRUBINUR Negative 07/07/2015 1034   BILIRUBINUR NEGATIVE 06/24/2015 1740   BILIRUBINUR negative 02/23/2015 1751   KETONESUR NEGATIVE 06/24/2015 1740   PROTEINUR 1+* 07/07/2015 1034   PROTEINUR NEGATIVE 06/24/2015 1740   PROTEINUR 2+ 02/23/2015 1751   UROBILINOGEN negative 02/23/2015 1751   UROBILINOGEN 0.2 06/09/2014 0545   NITRITE Negative 07/07/2015 1034   NITRITE NEGATIVE 06/24/2015 1740   NITRITE negative 02/23/2015 1751   LEUKOCYTESUR 3+* 07/07/2015 1034   LEUKOCYTESUR SMALL* 06/24/2015 1740    ----------------------------------------------------------------------------------------------------------------   Imaging Results:    Dg Chest 2 View  07/14/2015  CLINICAL DATA:  60 year old female with shortness of breath EXAM: CHEST  2 VIEW COMPARISON:  Chest radiograph dated 06/10/2015 FINDINGS: The heart size and mediastinal contours are within normal limits. Both lungs are clear. The visualized skeletal structures are unremarkable. IMPRESSION: No active  cardiopulmonary disease. Electronically Signed   By: Elgie Collard M.D.   On: 07/14/2015 00:58    My personal review of EKG: Rhythm NSR, Rate  80 /min, QTc 449* , no Acute ST changes   Assessment & Plan:    Active Problems:   Hyponatremia   Liver cirrhosis   Elevated  d-dimer   Acute kidney injury   1. Hyponatremia- likely from excessive diuresis and liver cirrhosis. We'll obtain serum osmolality. We'll start fluid restriction 1200 mL per day 2. Acute kidney injury- from excessive diuresis, hold Demadex and Aldactone at this time. Follow BMP in a.m. 3. Elevated d-dimer- patient came with mild shortness of breath, no hypoxia. D-dimer was checked with ED physician and it was elevated, CT angiogram could not be obtained due to renal insufficiency. VQ scan has been ordered. Follow the results. 4. History of Crohn disease- continue mesalamine 5. Liver cirrhosis- stable, continue lactulose, rifaximin. 6. Thrombocytopenia- secondary to cirrhosis, follow CBC in a.m.   DVT Prophylaxis-    SCDs   AM Labs Ordered, also please review Full Orders  Family Communication: no family at bedside  Code Status:  DNR  Admission status: Observation  Time spent in minutes : 50 min   Malavika Lira S M.D on 07/14/2015 at 4:48 AM  Between 7am to 7pm - Pager - [phone]. After 7pm go to [email or web address] - password [email or web address]  [facility] - Office  [phone]

## 2015-07-14 NOTE — Care Management Obs Status (Signed)
MEDICARE OBSERVATION STATUS NOTIFICATION   Patient Details  Name: Kathryn [name] MRN: [id number] Date of Birth: 1955/12/31   Medicare Observation Status Notification Given:  Yes    Wake Conlee, Chrystine Oiler, RN 07/14/2015, 2:05 PM

## 2015-07-14 NOTE — Progress Notes (Signed)
Dr. Sharl Ma notified via text page of patient's arrival to room 312.

## 2015-07-14 NOTE — ED Provider Notes (Signed)
CSN: [id number]     Arrival date & time 07/13/15  2336 History  By signing my name below, I, [facility], attest that this documentation has been prepared under the direction and in the presence of Shon Baton, MD. Electronically Signed: Randell Patient, ED Scribe. 07/14/2015. 12:17 AM.   Chief Complaint  Patient presents with  . Shortness of Breath    The history is provided by the patient. No language interpreter was used.   HPI Comments: Kathryn [name] is a 60 y.o. female with an hx of cirrhosis, Crohn's Disease, BLE edema who presents to the Emergency Department complaining of constant, mild SOB onset earlier today. Pt states that she feels as if she has "fluid on her lungs" and has been coughing up clear sputum. Patient states "I just also good." Denies hx of asthma, COPD, DVT. Denies cigarette smoking. Denies fevers, CP, leg swelling, or any other symptoms currently.  Recent admission for hepatic encephalopathy. At that time noted to be hypokalemic. Lasix was discontinued. Patient reports actual improved bilateral lower extremity edema.   Past Medical History  Diagnosis Date  . Hypertension   . Enteritis presumed infectious   . Crohn's   . Anemia   . Hepatitis C antibody test positive   . Hepatitis   . Renal insufficiency   . Cirrhosis (HCC)   . Splenomegaly   . Bilateral leg edema 01/2014  . Chronic back pain   . Thrombocytopenia [facility])    Past Surgical History  Procedure Laterality Date  . Colon surgery      MULTIPLE SURGERIES FOR CROHNS  . Ileostomy      multiple abdominal surgeries for Crohn's by Dr. Gabriel Cirri  . Cholecystectomy    . Abscess drainage      abdominal  . Esophagogastroduodenoscopy  10/05/2010    Procedure: ESOPHAGOGASTRODUODENOSCOPY (EGD);  Surgeon: [name], MD;  [facility];  Service: Endoscopy;  Laterality: N/A;  7:30 am  . Cirrhosis    . Esophagogastroduodenoscopy N/A 09/11/2013    Procedure:  ESOPHAGOGASTRODUODENOSCOPY (EGD);  Surgeon: [name], MD;  [facility];  Service: Endoscopy;  Laterality: N/A;  . Ileoscopy  09/11/2013    Procedure: ILEOSCOPY THROUGH STOMA;  Surgeon: [name], MD;  [facility];  Service: Endoscopy;;  . Givens capsule study N/A 09/12/2013    Procedure: GIVENS CAPSULE STUDY;  Surgeon: [name], MD;  [facility];  Service: Endoscopy;  Laterality: N/A;  . Givens capsule study N/A 11/25/2013    Procedure: GIVENS CAPSULE STUDY;  Surgeon: [name], MD;  [facility];  Service: Endoscopy;  Laterality: N/A;  . Varices cauterization Right     in the stoma of her ileostomy  . Tips procedure  11/2013    [facility]   . Abdominal surgery     Family History  Problem Relation Age of Onset  . Cancer Mother   . Stroke Father    Social History  Substance Use Topics  . Smoking status: Never Smoker   . Smokeless tobacco: Never Used  . Alcohol Use: No   OB History    Gravida Para Term Preterm AB TAB SAB Ectopic Multiple Living            1     Review of Systems  Constitutional: Negative for fever.  HENT: Positive for congestion.   Respiratory: Positive for cough and shortness of breath.   Cardiovascular: Negative for chest pain and  leg swelling (baseline).  Gastrointestinal: Negative for nausea, vomiting and abdominal pain.  All other systems reviewed and are negative.     Allergies  Penicillins  Home Medications   Prior to Admission medications   Medication Sig Start Date End Date Taking? Authorizing Provider  ALPRAZolam Prudy Feeler) 0.5 MG tablet Take 1 tablet (0.5 mg total) by mouth at bedtime as needed for anxiety. 07/02/15   Rolly Salter, MD  Amino Acids-Protein Hydrolys (FEEDING SUPPLEMENT, PRO-STAT SUGAR FREE 64,) LIQD Take 30 mLs by mouth 3 (three) times daily with meals. 07/02/15   Rolly Salter, MD  Cholecalciferol (VITAMIN D-1000 MAX ST) 1000 UNITS tablet Take 1,000 Units by mouth  daily.    Historical Provider, MD  ciprofloxacin (CIPRO) 250 MG tablet Take 1 tablet (250 mg total) by mouth 2 (two) times daily. 07/07/15   Elige Radon Dettinger, MD  feeding supplement, ENSURE ENLIVE, (ENSURE ENLIVE) LIQD Take 237 mLs by mouth daily. 03/27/15   Standley Brooking, MD  gabapentin (NEURONTIN) 100 MG capsule Take 1 capsule (100 mg total) by mouth 3 (three) times daily. 04/23/15   Ernestina Penna, MD  lactulose (CHRONULAC) 10 GM/15ML solution Take 45 mLs (30 g total) by mouth 3 (three) times daily. 07/02/15   Rolly Salter, MD  mesalamine (CANASA) 1000 MG suppository Place 1 suppository (1,000 mg total) rectally at bedtime. 04/22/15   Erick Blinks, MD  midodrine (PROAMATINE) 5 MG tablet Take 1 tablet (5 mg total) by mouth 3 (three) times daily with meals. 07/02/15   Rolly Salter, MD  Multiple Vitamins-Minerals (MULTIVITAMIN WITH MINERALS) tablet Take 1 tablet by mouth daily.    Historical Provider, MD  pantoprazole (PROTONIX) 40 MG tablet TAKE 1 TABLET BY MOUTH DAILY 06/09/14   Len Blalock, NP  potassium chloride (KLOR-CON) 20 MEQ packet Take 40 mEq by mouth daily. 07/02/15   Rolly Salter, MD  Probiotic Product (PROBIOTIC PO) Take 1 tablet by mouth daily.    Historical Provider, MD  rifaximin (XIFAXAN) 550 MG TABS tablet Take 1 tablet (550 mg total) by mouth 2 (two) times daily. 07/02/15   Rolly Salter, MD  spironolactone (ALDACTONE) 25 MG tablet Take 2 tablets (50 mg total) by mouth daily. 07/02/15   Rolly Salter, MD  torsemide (DEMADEX) 20 MG tablet Take 3 tablets (60 mg total) by mouth daily. 07/02/15   Rolly Salter, MD   BP 103/52 mmHg  Pulse 83  Temp(Src) 98 F (36.7 C) (Oral)  Resp 13  Ht 5\' 5"  (1.651 m)  Wt 187 lb (84.823 kg)  BMI 31.12 kg/m2  SpO2 100% Physical Exam  Constitutional: She is oriented to person, place, and time. No distress.  Chronically ill-appearing  HENT:  Head: Normocephalic and atraumatic.  Eyes: Pupils are equal, round, and reactive to light.   Cardiovascular: Normal rate and regular rhythm.   No murmur heard. Pulmonary/Chest: Effort normal and breath sounds normal. No respiratory distress. She has no wheezes.  Abdominal: Soft. Bowel sounds are normal. There is no tenderness. There is no rebound.  Ileostomy right midabdomen, liquid stool noted  Musculoskeletal: She exhibits edema.  Trace bilateral lower extremity edema  Neurological: She is alert and oriented to person, place, and time.  Skin: Skin is warm and dry.  Psychiatric: She has a normal mood and affect.  Nursing note and vitals reviewed.   ED Course  Procedures (including critical care time)  DIAGNOSTIC STUDIES: Oxygen Saturation is 100% on RA, normal by  my interpretation.    COORDINATION OF CARE: 12:12 AM Discussed treatment plan with pt at bedside and pt agreed to plan.  Labs Review Labs Reviewed  CBC WITH DIFFERENTIAL/PLATELET - Abnormal; Notable for the following:    RBC 2.87 (*)    Hemoglobin 9.0 (*)    HCT 25.2 (*)    RDW 16.6 (*)    Platelets 91 (*)    All other components within normal limits  COMPREHENSIVE METABOLIC PANEL - Abnormal; Notable for the following:    Sodium 119 (*)    Chloride 99 (*)    CO2 12 (*)    Glucose, Bld 132 (*)    BUN 83 (*)    Creatinine, Ser 2.34 (*)    Calcium 8.5 (*)    Total Protein 5.8 (*)    Albumin 3.3 (*)    Alkaline Phosphatase 170 (*)    Total Bilirubin 1.3 (*)    GFR calc non Af Amer 22 (*)    GFR calc Af Amer 25 (*)    All other components within normal limits  D-DIMER, QUANTITATIVE (NOT AT [facility]) - Abnormal; Notable for the following:    D-Dimer, Quant 2.08 (*)    All other components within normal limits  BRAIN NATRIURETIC PEPTIDE  TROPONIN I    Imaging Review Dg Chest 2 View  07/14/2015  CLINICAL DATA:  60 year old female with shortness of breath EXAM: CHEST  2 VIEW COMPARISON:  Chest radiograph dated 06/10/2015 FINDINGS: The heart size and mediastinal contours are within normal limits. Both  lungs are clear. The visualized skeletal structures are unremarkable. IMPRESSION: No active cardiopulmonary disease. Electronically Signed   By: Elgie Collard M.D.   On: 07/14/2015 00:58   I have personally reviewed and evaluated these images and lab results as part of my medical decision-making.   EKG Interpretation   Date/Time:  Monday July 13 2015 23:50:42 EDT Ventricular Rate:  80 PR Interval:  154 QRS Duration: 104 QT Interval:  389 QTC Calculation: 449 R Axis:   59 Text Interpretation:  Sinus rhythm Low voltage, extremity and precordial  leads Confirmed by Kalista Laguardia  MD, Corean Yoshimura ([id number]) on 07/14/2015 12:53:59 AM      MDM   Final diagnoses:  Hyponatremia  SOB (shortness of breath)  D-dimer, elevated    Patient presents with shortness of breath. Denies history of COPD or CHF and reports that she feels like "I have fluid on the lungs." She is nontoxic on exam. Afebrile. Satting 100% on room air. No acute distress. Physical exam as above and pulmonary exam is within normal limits. Basic labwork pain. EKG is nonischemic. Chest x-ray shows no infiltrate or evidence of pulmonary edema. Troponin and BNP are reassuring. Patient was notably hyponatremic at 119. Creatinine is also elevated at 2.34 with a baseline of 1.7-1.8.  Patient was given 500 mL of fluid. Patient's d-dimer is elevated at 2.08.  Unable to obtain CT scan secondary to renal insufficiently.  Patient did recently have a negative right lower extremity ultrasound for DVT. She will likely need a VQ scan. Poor candidate for empiric anticoagulation given thrombocytopenia. Discussed this with Dr. Sharl Ma. We'll hold off and obtain CT scan during the day tomorrow. Admit for hydration and hyponatremia.  I personally performed the services described in this documentation, which was scribed in my presence. The recorded information has been reviewed and is accurate.    Shon Baton, MD 07/14/15 [phone]

## 2015-07-14 NOTE — ED Notes (Signed)
Assisted patient to the bathroom and back to her room with out any issues. Connected patient back to tele.

## 2015-07-14 NOTE — Care Management Note (Addendum)
Case Management Note  Patient Details  Name: Kathryn [name] MRN: [id number] Date of Birth: 12/04/1955  Subjective/Objective: Patient is from home with husband. She is independent with ADL's. She drives herself to appointments. Her PCP is Dr. Louanne Skye. She has [employer] and reports no issue with affording medications. Patient has refused home health services in the past. Most re cently with [facility].                  Action/Plan: Anticipate discharge home with self care. Patient does not want any home health services.    Expected Discharge Date:    07/15/2015              Expected Discharge Plan:  Home/Self Care  In-House Referral:     Discharge planning Services  CM Consult  Post Acute Care Choice:  NA Choice offered to:  NA  DME Arranged:    DME Agency:     HH Arranged:    HH Agency:     Status of Service:  Completed, signed off  Medicare Important Message Given:    Date Medicare IM Given:    Medicare IM give by:    Date Additional Medicare IM Given:    Additional Medicare Important Message give by:     If discussed at Long Length of Stay Meetings, dates discussed:    Additional Comments:  [place], Chrystine Oiler, RN 07/14/2015, 2:02 PM

## 2015-07-14 NOTE — Progress Notes (Signed)
[facility] PROGRESS NOTE  Kathryn [name] KZS:[id number] DOB: [birth year]    PCP:   Elige Radon Dettinger, MD   HPI:  Kathryn [name] is a 60 y.o. female, With history of hemosiderosis, Crohn disease, hepatic encephalopathy who was recently discharged from the hospital on 07/02/2015 after being treated for hepatic encephalopathy. The patient came to the hospital with shortness of breath, chest x-ray shows no acute abnormality. Revealed hyponatremia with sodium 119, also had elevated d-dimer 2.08. Patient denies nausea vomiting or diarrhea. No chest pain. No fever or chills. No dysuria. She has colostomy in place.  She was started on low volume IVF with NS, and her diuretics were discontinued, as it was felt that it was diuretic induced hypovolemic, hyponatremia.   She was fluid restricted as well.  Her NA came up from 119 to 121.  She is still feeling weak.  Her V/Q showed low probability for PE.     Rewiew of Systems:  Constitutional: Negative for malaise, fever and chills. No significant weight loss or weight gain Eyes: Negative for eye pain, redness and discharge, diplopia, visual changes, or flashes of light. ENMT: Negative for ear pain, hoarseness, nasal congestion, sinus pressure and sore throat. No headaches; tinnitus, drooling, or problem swallowing. Cardiovascular: Negative for chest pain, palpitations, diaphoresis, dyspnea and peripheral edema. ; No orthopnea, PND Respiratory: Negative for cough, hemoptysis, wheezing and stridor. No pleuritic chestpain. Gastrointestinal: Negative for nausea, vomiting, diarrhea, constipation, abdominal pain, melena, blood in stool, hematemesis, jaundice and rectal bleeding.    Genitourinary: Negative for frequency, dysuria, incontinence,flank pain and hematuria; Musculoskeletal: Negative for back pain and neck pain. Negative for swelling and trauma.;  Skin: . Negative for pruritus, rash, abrasions, bruising and skin lesion.; ulcerations Neuro: Negative  for headache, lightheadedness and neck stiffness. Negative for weakness, altered level of consciousness , altered mental status, extremity weakness, burning feet, involuntary movement, seizure and syncope.  Psych: negative for anxiety, depression, insomnia, tearfulness, panic attacks, hallucinations, paranoia, suicidal or homicidal ideation   Past Medical History  Diagnosis Date  . Hypertension   . Enteritis presumed infectious   . Crohn's   . Anemia   . Hepatitis C antibody test positive   . Hepatitis   . Renal insufficiency   . Cirrhosis (HCC)   . Splenomegaly   . Bilateral leg edema 01/2014  . Chronic back pain   . Thrombocytopenia [facility])     Past Surgical History  Procedure Laterality Date  . Colon surgery      MULTIPLE SURGERIES FOR CROHNS  . Ileostomy      multiple abdominal surgeries for Crohn's by Dr. Gabriel Cirri  . Cholecystectomy    . Abscess drainage      abdominal  . Esophagogastroduodenoscopy  10/05/2010    Procedure: ESOPHAGOGASTRODUODENOSCOPY (EGD);  Surgeon: [name], MD;  [facility];  Service: Endoscopy;  Laterality: N/A;  7:30 am  . Cirrhosis    . Esophagogastroduodenoscopy N/A 09/11/2013    Procedure: ESOPHAGOGASTRODUODENOSCOPY (EGD);  Surgeon: [name], MD;  [facility]: [facility];  Service: Endoscopy;  Laterality: N/A;  . Ileoscopy  09/11/2013    Procedure: ILEOSCOPY THROUGH STOMA;  Surgeon: [name], MD;  Location: [facility];  Service: Endoscopy;;  . Givens capsule study N/A 09/12/2013    Procedure: GIVENS CAPSULE STUDY;  Surgeon: [name], MD;  [facility];  Service: Endoscopy;  Laterality: N/A;  . Givens capsule study N/A 11/25/2013    Procedure: GIVENS CAPSULE STUDY;  Surgeon: [name], MD;  [facility]: [facility];  Service: Endoscopy;  Laterality: N/A;  . Varices cauterization Right     in the stoma of her ileostomy  . Tips procedure  11/2013    [facility]   . Abdominal surgery       Medications:  HOME MEDS: Prior to Admission medications   Medication Sig Start Date End Date Taking? Authorizing Provider  ALPRAZolam Prudy Feeler) 0.5 MG tablet Take 1 tablet (0.5 mg total) by mouth at bedtime as needed for anxiety. 07/02/15  Yes Rolly Salter, MD  Amino Acids-Protein Hydrolys (FEEDING SUPPLEMENT, PRO-STAT SUGAR FREE 64,) LIQD Take 30 mLs by mouth 3 (three) times daily with meals. 07/02/15  Yes Rolly Salter, MD  Cholecalciferol (VITAMIN D-1000 MAX ST) 1000 UNITS tablet Take 1,000 Units by mouth daily.   Yes Historical Provider, MD  feeding supplement, ENSURE ENLIVE, (ENSURE ENLIVE) LIQD Take 237 mLs by mouth daily. 03/27/15  Yes Standley Brooking, MD  gabapentin (NEURONTIN) 100 MG capsule Take 1 capsule (100 mg total) by mouth 3 (three) times daily. 04/23/15  Yes Ernestina Penna, MD  lactulose (CHRONULAC) 10 GM/15ML solution Take 45 mLs (30 g total) by mouth 3 (three) times daily. 07/02/15  Yes Rolly Salter, MD  mesalamine (CANASA) 1000 MG suppository Place 1 suppository (1,000 mg total) rectally at bedtime. Patient taking differently: Place 1,000 mg rectally at bedtime as needed (chrones).  04/22/15  Yes Erick Blinks, MD  midodrine (PROAMATINE) 5 MG tablet Take 1 tablet (5 mg total) by mouth 3 (three) times daily with meals. 07/02/15  Yes Rolly Salter, MD  Multiple Vitamins-Minerals (MULTIVITAMIN WITH MINERALS) tablet Take 1 tablet by mouth daily.   Yes Historical Provider, MD  pantoprazole (PROTONIX) 40 MG tablet TAKE 1 TABLET BY MOUTH DAILY 06/09/14  Yes Len Blalock, NP  potassium chloride (KLOR-CON) 20 MEQ packet Take 40 mEq by mouth daily. 07/02/15  Yes Rolly Salter, MD  Probiotic Product (PROBIOTIC PO) Take 1 tablet by mouth daily.   Yes Historical Provider, MD  rifaximin (XIFAXAN) 550 MG TABS tablet Take 1 tablet (550 mg total) by mouth 2 (two) times daily. 07/02/15  Yes Rolly Salter, MD  spironolactone (ALDACTONE) 25 MG tablet Take 2 tablets (50 mg total) by mouth  daily. 07/02/15  Yes Rolly Salter, MD  ciprofloxacin (CIPRO) 250 MG tablet Take 1 tablet (250 mg total) by mouth 2 (two) times daily. Patient not taking: Reported on 07/14/2015 07/07/15   Elige Radon Dettinger, MD  torsemide (DEMADEX) 20 MG tablet Take 3 tablets (60 mg total) by mouth daily. 07/02/15   Rolly Salter, MD     Allergies:  Allergies  Allergen Reactions  . Penicillins Rash    Has patient had a PCN reaction causing immediate rash, facial/tongue/throat swelling, SOB or lightheadedness with hypotension: Yes Has patient had a PCN reaction causing severe rash involving mucus membranes or skin necrosis: No Has patient had a PCN reaction that required hospitalization No Has patient had a PCN reaction occurring within the last 10 years: No If all of the above answers are "NO", then may proceed with Cephalosporin use.     Social History:   reports that she has never smoked. She has never used smokeless tobacco. She reports that she does not drink alcohol or use illicit drugs.  Family History: Family History  Problem Relation Age of Onset  . Cancer Mother   . Stroke Father  Physical Exam: Filed Vitals:   07/14/15 0104 07/14/15 0130 07/14/15 0331 07/14/15 0455  BP: 94/40 103/52 106/47 103/41  Pulse: 83 83 73 81  Temp:   98.7 F (37.1 C) 98.4 F (36.9 C)  TempSrc:   Oral Oral  Resp: 16 13 18 20   Height:   5\' 5"  (1.651 m)   Weight:   83.054 kg (183 lb 1.6 oz)   SpO2: 99% 100% 99% 98%   Blood pressure 103/41, pulse 81, temperature 98.4 F (36.9 C), temperature source Oral, resp. rate 20, height 5\' 5"  (1.651 m), weight 83.054 kg (183 lb 1.6 oz), SpO2 98 %.  GEN:  Pleasant  patient lying in the stretcher in no acute distress; cooperative with exam. PSYCH:  alert and oriented x4; does not appear anxious or depressed; affect is appropriate. HEENT: Mucous membranes pink and anicteric; PERRLA; EOM intact; no cervical lymphadenopathy nor thyromegaly or carotid bruit; no JVD;  There were no stridor. Neck is very supple. Breasts:: Not examined CHEST WALL: No tenderness CHEST: Normal respiration, clear to auscultation bilaterally.  HEART: Regular rate and rhythm.  There are no murmur, rub, or gallops.   BACK: No kyphosis or scoliosis; no CVA tenderness ABDOMEN: soft and non-tender; no masses, no organomegaly, normal abdominal bowel sounds; no pannus; no intertriginous candida. There is no rebound and no distention. Rectal Exam: Not done EXTREMITIES: No bone or joint deformity; age-appropriate arthropathy of the hands and knees; no edema; no ulcerations.  There is no calf tenderness. Genitalia: not examined PULSES: 2+ and symmetric SKIN: Normal hydration no rash or ulceration CNS: Cranial nerves 2-12 grossly intact no focal lateralizing neurologic deficit.  Speech is fluent; uvula elevated with phonation, facial symmetry and tongue midline. DTR are normal bilaterally, cerebella exam is intact, barbinski is negative and strengths are equaled bilaterally.  No sensory loss.   Labs on Admission:  Basic Metabolic Panel:  Recent Labs Lab 07/14/15 0052 07/14/15 0625  NA 119* 121*  K 3.5 3.3*  CL 99* 101  CO2 12* 12*  GLUCOSE 132* 106*  BUN 83* 80*  CREATININE 2.34* 2.10*  CALCIUM 8.5* 8.4*   Liver Function Tests:  Recent Labs Lab 07/14/15 0052 07/14/15 0625  AST 32 27  ALT 21 21  ALKPHOS 170* 147*  BILITOT 1.3* 1.4*  PROT 5.8* 5.2*  ALBUMIN 3.3* 2.9*   CBC:  Recent Labs Lab 07/14/15 0052 07/14/15 0625  WBC 7.4 6.5  NEUTROABS 5.3  --   HGB 9.0* 8.5*  HCT 25.2* 24.4*  MCV 87.8 87.8  PLT 91* 98*   Cardiac Enzymes:  Recent Labs Lab 07/14/15 0052  TROPONINI 0.03    Radiological Exams on Admission: Dg Chest 2 View  07/14/2015  CLINICAL DATA:  60 year old female with shortness of breath EXAM: CHEST  2 VIEW COMPARISON:  Chest radiograph dated 06/10/2015 FINDINGS: The heart size and mediastinal contours are within normal limits. Both lungs are  clear. The visualized skeletal structures are unremarkable. IMPRESSION: No active cardiopulmonary disease. Electronically Signed   By: Elgie Collard M.D.   On: 07/14/2015 00:58   Nm Pulmonary Perf And Vent  07/14/2015  CLINICAL DATA:  Shortness of breath and persistent cough. Inpatient. EXAM: NUCLEAR MEDICINE VENTILATION - PERFUSION LUNG SCAN TECHNIQUE: Ventilation images were obtained in multiple projections using inhaled aerosol Tc-45m DTPA. Perfusion images were obtained in multiple projections after intravenous injection of Tc-75m MAA. RADIOPHARMACEUTICALS:  33 mCi Technetium-38m DTPA aerosol inhalation and 4.4 mCi Technetium-59m MAA IV COMPARISON:  Chest radiograph from  earlier today. FINDINGS: Ventilation: There is accumulation of radiotracer in the central airways with heterogeneous ventilation of both lungs. Perfusion: There are two small segmental matched perfusion defects in the right upper lung. Otherwise normal perfusion. IMPRESSION: Very low probability for pulmonary embolism (0-9%). Electronically Signed   By: Delbert Phenix M.D.   On: 07/14/2015 08:29    EKG: Independently reviewed.    Assessment/Plan Present on Admission:  . Hyponatremia . Hepatic cirrhosis (HCC) . CKD (chronic kidney disease) stage 3, GFR 30-59 ml/min  PLAN:  Hyponatremia:  Likely hypovolemic hyponatremia.  Will continue with her IVF.  D/c fluid restriction.  And use Lasix IV PRN to correct free water.  Avoid too rapid correction.  She is feeling better.  CKD:  AKI on CKD, suspect prerenal.  Will continue to follow.   Hypokalemia:  Only slightly, will add K into her IVF.   Hepatic cirrhosis:  Stable.   Not encephalopathic.  Will check NH3.   Other plans as per orders.  Code Status: DNR.    Houston Siren, MD.  FACP [facility] Pager [phone] 7pm to 7am.  07/14/2015, 12:20 PM

## 2015-07-15 DIAGNOSIS — K7030 Alcoholic cirrhosis of liver without ascites: Secondary | ICD-10-CM | POA: Diagnosis not present

## 2015-07-15 DIAGNOSIS — E871 Hypo-osmolality and hyponatremia: Secondary | ICD-10-CM | POA: Diagnosis not present

## 2015-07-15 DIAGNOSIS — R0600 Dyspnea, unspecified: Secondary | ICD-10-CM

## 2015-07-15 DIAGNOSIS — R791 Abnormal coagulation profile: Secondary | ICD-10-CM | POA: Diagnosis not present

## 2015-07-15 DIAGNOSIS — N183 Chronic kidney disease, stage 3 (moderate): Secondary | ICD-10-CM | POA: Diagnosis not present

## 2015-07-15 LAB — BASIC METABOLIC PANEL
ANION GAP: 5 (ref 5–15)
BUN: 69 mg/dL — ABNORMAL HIGH (ref 6–20)
CHLORIDE: 107 mmol/L (ref 101–111)
CO2: 14 mmol/L — AB (ref 22–32)
CREATININE: 1.72 mg/dL — AB (ref 0.44–1.00)
Calcium: 8.2 mg/dL — ABNORMAL LOW (ref 8.9–10.3)
GFR calc non Af Amer: 31 mL/min — ABNORMAL LOW (ref >60)
GFR, EST AFRICAN AMERICAN: 36 mL/min — AB (ref >60)
Glucose, Bld: 149 mg/dL — ABNORMAL HIGH (ref 65–99)
POTASSIUM: 3 mmol/L — AB (ref 3.5–5.1)
Sodium: 126 mmol/L — ABNORMAL LOW (ref 135–145)

## 2015-07-15 LAB — AMMONIA: Ammonia: 50 umol/L — ABNORMAL HIGH (ref 9–35)

## 2015-07-15 MED ORDER — OXYCODONE HCL 5 MG PO TABS
5.0000 mg | ORAL_TABLET | ORAL | Status: DC | PRN
  Administered 2015-07-15 – 2015-07-16 (×2): 5 mg via ORAL
  Filled 2015-07-15 (×2): qty 1

## 2015-07-15 NOTE — Progress Notes (Signed)
[facility] PROGRESS NOTE  [name]:[id number] DOB: Oct 03, 1955    PCP:   Elige Radon Dettinger, MD   HPI:  Kathryn [name] is a 60 y.o. female, With history of hemosiderosis, Crohn disease, hepatic encephalopathy who was recently discharged from the hospital on 07/02/2015 after being treated for hepatic encephalopathy. The patient came to the hospital with shortness of breath, chest x-ray shows no acute abnormality. Revealed hyponatremia with sodium 119, also had elevated d-dimer 2.08. Patient denies nausea vomiting or diarrhea. No chest pain. No fever or chills. No dysuria. She has colostomy in place. She was started on low volume IVF with NS, and her diuretics were discontinued, as it was felt that it was diuretic induced hypovolemic, hyponatremia. She was fluid restricted as well. Her NA came up from 119 to 126.  Her K was being repleted.  Her NH3 was checked and it was elevated, and her lactulose and Xifaxamin was restarted.  She is still feeling weak but improves each day.  Her V/Q showed low probability for PE.  Rewiew of Systems:  Constitutional: Negative for malaise, fever and chills. No significant weight loss or weight gain Eyes: Negative for eye pain, redness and discharge, diplopia, visual changes, or flashes of light. ENMT: Negative for ear pain, hoarseness, nasal congestion, sinus pressure and sore throat. No headaches; tinnitus, drooling, or problem swallowing. Cardiovascular: Negative for chest pain, palpitations, diaphoresis, dyspnea and peripheral edema. ; No orthopnea, PND Respiratory: Negative for cough, hemoptysis, wheezing and stridor. No pleuritic chestpain. Gastrointestinal: Negative for nausea, vomiting, diarrhea, constipation, abdominal pain, melena, blood in stool, hematemesis, jaundice and rectal bleeding.    Genitourinary: Negative for frequency, dysuria, incontinence,flank pain and hematuria; Musculoskeletal: Negative for back pain and neck pain.  Negative for swelling and trauma.;  Skin: . Negative for pruritus, rash, abrasions, bruising and skin lesion.; ulcerations Neuro: Negative for headache, lightheadedness and neck stiffness. Negative for weakness, altered level of consciousness , altered mental status, extremity weakness, burning feet, involuntary movement, seizure and syncope.  Psych: negative for anxiety, depression, insomnia, tearfulness, panic attacks, hallucinations, paranoia, suicidal or homicidal ideation    Past Medical History  Diagnosis Date  . Hypertension   . Enteritis presumed infectious   . Crohn's   . Anemia   . Hepatitis C antibody test positive   . Hepatitis   . Renal insufficiency   . Cirrhosis (HCC)   . Splenomegaly   . Bilateral leg edema 01/2014  . Chronic back pain   . Thrombocytopenia [facility])     Past Surgical History  Procedure Laterality Date  . Colon surgery      MULTIPLE SURGERIES FOR CROHNS  . Ileostomy      multiple abdominal surgeries for Crohn's by Dr. Gabriel Cirri  . Cholecystectomy    . Abscess drainage      abdominal  . Esophagogastroduodenoscopy  10/05/2010    Procedure: ESOPHAGOGASTRODUODENOSCOPY (EGD);  Surgeon: [name], MD;  [facility];  Service: Endoscopy;  Laterality: N/A;  7:30 am  . Cirrhosis    . Esophagogastroduodenoscopy N/A 09/11/2013    Procedure: ESOPHAGOGASTRODUODENOSCOPY (EGD);  Surgeon: [name], MD;  [facility];  Service: Endoscopy;  Laterality: N/A;  . Ileoscopy  09/11/2013    Procedure: ILEOSCOPY THROUGH STOMA;  Surgeon: [name], MD;  [facility];  Service: Endoscopy;;  . Givens capsule study N/A 09/12/2013    Procedure: GIVENS CAPSULE STUDY;  Surgeon: [name], MD;  [facility];  Service: Endoscopy;  Laterality: N/A;  . Givens capsule study N/A 11/25/2013    Procedure: GIVENS CAPSULE STUDY;  Surgeon: [name], MD;  [facility];  Service: Endoscopy;  Laterality: N/A;  .  Varices cauterization Right     in the stoma of her ileostomy  . Tips procedure  11/2013    [facility]   . Abdominal surgery      Medications:  HOME MEDS: Prior to Admission medications   Medication Sig Start Date End Date Taking? Authorizing Provider  ALPRAZolam Prudy Feeler) 0.5 MG tablet Take 1 tablet (0.5 mg total) by mouth at bedtime as needed for anxiety. 07/02/15  Yes Rolly Salter, MD  Amino Acids-Protein Hydrolys (FEEDING SUPPLEMENT, PRO-STAT SUGAR FREE 64,) LIQD Take 30 mLs by mouth 3 (three) times daily with meals. 07/02/15  Yes Rolly Salter, MD  Cholecalciferol (VITAMIN D-1000 MAX ST) 1000 UNITS tablet Take 1,000 Units by mouth daily.   Yes Historical Provider, MD  feeding supplement, ENSURE ENLIVE, (ENSURE ENLIVE) LIQD Take 237 mLs by mouth daily. 03/27/15  Yes Standley Brooking, MD  gabapentin (NEURONTIN) 100 MG capsule Take 1 capsule (100 mg total) by mouth 3 (three) times daily. 04/23/15  Yes Ernestina Penna, MD  lactulose (CHRONULAC) 10 GM/15ML solution Take 45 mLs (30 g total) by mouth 3 (three) times daily. 07/02/15  Yes Rolly Salter, MD  mesalamine (CANASA) 1000 MG suppository Place 1 suppository (1,000 mg total) rectally at bedtime. Patient taking differently: Place 1,000 mg rectally at bedtime as needed (chrones).  04/22/15  Yes Erick Blinks, MD  midodrine (PROAMATINE) 5 MG tablet Take 1 tablet (5 mg total) by mouth 3 (three) times daily with meals. 07/02/15  Yes Rolly Salter, MD  Multiple Vitamins-Minerals (MULTIVITAMIN WITH MINERALS) tablet Take 1 tablet by mouth daily.   Yes Historical Provider, MD  pantoprazole (PROTONIX) 40 MG tablet TAKE 1 TABLET BY MOUTH DAILY 06/09/14  Yes Len Blalock, NP  potassium chloride (KLOR-CON) 20 MEQ packet Take 40 mEq by mouth daily. 07/02/15  Yes Rolly Salter, MD  Probiotic Product (PROBIOTIC PO) Take 1 tablet by mouth daily.   Yes Historical Provider, MD  rifaximin (XIFAXAN) 550 MG TABS tablet Take 1 tablet (550 mg total) by mouth 2 (two)  times daily. 07/02/15  Yes Rolly Salter, MD  spironolactone (ALDACTONE) 25 MG tablet Take 2 tablets (50 mg total) by mouth daily. 07/02/15  Yes Rolly Salter, MD  torsemide (DEMADEX) 20 MG tablet Take 3 tablets (60 mg total) by mouth daily. 07/02/15  Yes Rolly Salter, MD  ciprofloxacin (CIPRO) 250 MG tablet Take 1 tablet (250 mg total) by mouth 2 (two) times daily. Patient not taking: Reported on 07/14/2015 07/07/15   Elige Radon Dettinger, MD     Allergies:  Allergies  Allergen Reactions  . Penicillins Rash    Has patient had a PCN reaction causing immediate rash, facial/tongue/throat swelling, SOB or lightheadedness with hypotension: Yes Has patient had a PCN reaction causing severe rash involving mucus membranes or skin necrosis: No Has patient had a PCN reaction that required hospitalization No Has patient had a PCN reaction occurring within the last 10 years: No If all of the above answers are "NO", then may proceed with Cephalosporin use.     Social History:   reports that she has never smoked. She has never used smokeless tobacco. She reports that she does not drink alcohol or use illicit drugs.  Family History: Family  History  Problem Relation Age of Onset  . Cancer Mother   . Stroke Father      Physical Exam: Filed Vitals:   07/14/15 0455 07/14/15 1248 07/14/15 2222 07/15/15 0500  BP: 103/41 116/75 107/44 106/53  Pulse: 81 79 75 90  Temp: 98.4 F (36.9 C) 98.3 F (36.8 C) 98.6 F (37 C) 98.5 F (36.9 C)  TempSrc: Oral Oral Oral Oral  Resp: 20 20 20 20   Height:      Weight:      SpO2: 98% 98% 100% 98%   Blood pressure 106/53, pulse 90, temperature 98.5 F (36.9 C), temperature source Oral, resp. rate 20, height 5\' 5"  (1.651 m), weight 83.054 kg (183 lb 1.6 oz), SpO2 98 %.  GEN:  Pleasant  patient lying in the stretcher in no acute distress; cooperative with exam. PSYCH:  alert and oriented x4; does not appear anxious or depressed; affect is appropriate. HEENT:  Mucous membranes pink and anicteric; PERRLA; EOM intact; no cervical lymphadenopathy nor thyromegaly or carotid bruit; no JVD; There were no stridor. Neck is very supple. Breasts:: Not examined CHEST WALL: No tenderness CHEST: Normal respiration, clear to auscultation bilaterally.  HEART: Regular rate and rhythm.  There are no murmur, rub, or gallops.   BACK: No kyphosis or scoliosis; no CVA tenderness ABDOMEN: soft and non-tender; no masses, no organomegaly, normal abdominal bowel sounds; no pannus; no intertriginous candida. There is no rebound and no distention. Rectal Exam: Not done EXTREMITIES: No bone or joint deformity; age-appropriate arthropathy of the hands and knees; no edema; no ulcerations.  There is no calf tenderness. Genitalia: not examined PULSES: 2+ and symmetric SKIN: Normal hydration no rash or ulceration CNS: Cranial nerves 2-12 grossly intact no focal lateralizing neurologic deficit.  Speech is fluent; uvula elevated with phonation, facial symmetry and tongue midline. DTR are normal bilaterally, cerebella exam is intact, barbinski is negative and strengths are equaled bilaterally.  No sensory loss.   Labs on Admission:  Basic Metabolic Panel:  Recent Labs Lab 07/14/15 0052 07/14/15 0625 07/15/15 0616  NA 119* 121* 126*  K 3.5 3.3* 3.0*  CL 99* 101 107  CO2 12* 12* 14*  GLUCOSE 132* 106* 149*  BUN 83* 80* 69*  CREATININE 2.34* 2.10* 1.72*  CALCIUM 8.5* 8.4* 8.2*   Liver Function Tests:  Recent Labs Lab 07/14/15 0052 07/14/15 0625  AST 32 27  ALT 21 21  ALKPHOS 170* 147*  BILITOT 1.3* 1.4*  PROT 5.8* 5.2*  ALBUMIN 3.3* 2.9*    Recent Labs Lab 07/15/15 0616  AMMONIA 50*   CBC:  Recent Labs Lab 07/14/15 0052 07/14/15 0625  WBC 7.4 6.5  NEUTROABS 5.3  --   HGB 9.0* 8.5*  HCT 25.2* 24.4*  MCV 87.8 87.8  PLT 91* 98*   Cardiac Enzymes:  Recent Labs Lab 07/14/15 0052  TROPONINI 0.03    Radiological Exams on Admission: Dg Chest 2  View  07/14/2015  CLINICAL DATA:  60 year old female with shortness of breath EXAM: CHEST  2 VIEW COMPARISON:  Chest radiograph dated 06/10/2015 FINDINGS: The heart size and mediastinal contours are within normal limits. Both lungs are clear. The visualized skeletal structures are unremarkable. IMPRESSION: No active cardiopulmonary disease. Electronically Signed   By: Elgie Collard M.D.   On: 07/14/2015 00:58   Nm Pulmonary Perf And Vent  07/14/2015  CLINICAL DATA:  Shortness of breath and persistent cough. Inpatient. EXAM: NUCLEAR MEDICINE VENTILATION - PERFUSION LUNG SCAN TECHNIQUE: Ventilation images were obtained  in multiple projections using inhaled aerosol Tc-41m DTPA. Perfusion images were obtained in multiple projections after intravenous injection of Tc-16m MAA. RADIOPHARMACEUTICALS:  33 mCi Technetium-59m DTPA aerosol inhalation and 4.4 mCi Technetium-62m MAA IV COMPARISON:  Chest radiograph from earlier today. FINDINGS: Ventilation: There is accumulation of radiotracer in the central airways with heterogeneous ventilation of both lungs. Perfusion: There are two small segmental matched perfusion defects in the right upper lung. Otherwise normal perfusion. IMPRESSION: Very low probability for pulmonary embolism (0-9%). Electronically Signed   By: Delbert Phenix M.D.   On: 07/14/2015 08:29    Assessment/Plan Present on Admission:  . Hyponatremia . Hepatic cirrhosis (HCC) . CKD (chronic kidney disease) stage 3, GFR 30-59 ml/min  PLAN:  Hyponatremia: Likely hypovolemic hyponatremia. Will continue with her IVF. D/c fluid restriction. And use Lasix IV PRN to correct free water. Avoid too rapid correction. She is feeling better.  Will continue to correct Na to near normal prior to discharge.   CKD: AKI on CKD, suspect prerenal. Will continue to follow. Cr improved to 1.7.  Hypokalemia: Only slightly, will add K into her IVF.   Hepatic cirrhosis: Stable. Not encephalopathic. Will  check NH3.   Other plans as per orders. Code Status: DNR.    Houston Siren, MD.  FACP [facility] Pager [phone] 7pm to 7am.  07/15/2015, 11:02 AM

## 2015-07-16 DIAGNOSIS — E871 Hypo-osmolality and hyponatremia: Secondary | ICD-10-CM | POA: Diagnosis not present

## 2015-07-16 DIAGNOSIS — N179 Acute kidney failure, unspecified: Secondary | ICD-10-CM | POA: Diagnosis present

## 2015-07-16 DIAGNOSIS — I129 Hypertensive chronic kidney disease with stage 1 through stage 4 chronic kidney disease, or unspecified chronic kidney disease: Secondary | ICD-10-CM | POA: Diagnosis present

## 2015-07-16 DIAGNOSIS — N183 Chronic kidney disease, stage 3 (moderate): Secondary | ICD-10-CM | POA: Diagnosis not present

## 2015-07-16 DIAGNOSIS — Z809 Family history of malignant neoplasm, unspecified: Secondary | ICD-10-CM | POA: Diagnosis not present

## 2015-07-16 DIAGNOSIS — R531 Weakness: Secondary | ICD-10-CM | POA: Diagnosis present

## 2015-07-16 DIAGNOSIS — T502X5A Adverse effect of carbonic-anhydrase inhibitors, benzothiadiazides and other diuretics, initial encounter: Secondary | ICD-10-CM | POA: Diagnosis present

## 2015-07-16 DIAGNOSIS — K7030 Alcoholic cirrhosis of liver without ascites: Secondary | ICD-10-CM | POA: Diagnosis not present

## 2015-07-16 DIAGNOSIS — E861 Hypovolemia: Secondary | ICD-10-CM | POA: Diagnosis present

## 2015-07-16 DIAGNOSIS — R791 Abnormal coagulation profile: Secondary | ICD-10-CM | POA: Diagnosis not present

## 2015-07-16 DIAGNOSIS — Z932 Ileostomy status: Secondary | ICD-10-CM | POA: Diagnosis not present

## 2015-07-16 DIAGNOSIS — K5090 Crohn's disease, unspecified, without complications: Secondary | ICD-10-CM | POA: Diagnosis present

## 2015-07-16 DIAGNOSIS — Z933 Colostomy status: Secondary | ICD-10-CM | POA: Diagnosis not present

## 2015-07-16 DIAGNOSIS — K7460 Unspecified cirrhosis of liver: Secondary | ICD-10-CM | POA: Diagnosis present

## 2015-07-16 DIAGNOSIS — Z66 Do not resuscitate: Secondary | ICD-10-CM | POA: Diagnosis present

## 2015-07-16 DIAGNOSIS — R0600 Dyspnea, unspecified: Secondary | ICD-10-CM | POA: Diagnosis not present

## 2015-07-16 DIAGNOSIS — E876 Hypokalemia: Secondary | ICD-10-CM | POA: Diagnosis present

## 2015-07-16 DIAGNOSIS — D6959 Other secondary thrombocytopenia: Secondary | ICD-10-CM | POA: Diagnosis present

## 2015-07-16 DIAGNOSIS — Z823 Family history of stroke: Secondary | ICD-10-CM | POA: Diagnosis not present

## 2015-07-16 NOTE — Progress Notes (Signed)
[facility] PROGRESS NOTE  Kathryn [name] ZOX:[id number] DOB: Jun 19, 1955    PCP:   Elige Radon Dettinger, MD   HPI:  Kathryn [name] is a 60 y.o. female, With history of hemosiderosis, Crohn disease, hepatic encephalopathy who was recently discharged from the hospital on 07/02/2015 after being treated for hepatic encephalopathy. The patient came to the hospital with shortness of breath, chest x-ray shows no acute abnormality. Revealed hyponatremia with sodium 119, also had elevated d-dimer 2.08. Patient denies nausea vomiting or diarrhea. No chest pain. No fever or chills. No dysuria. She has colostomy in place. She was started on low volume IVF with NS, and her diuretics were discontinued, as it was felt that it was diuretic induced hypovolemic, hyponatremia. She was fluid restricted as well. Her NA came up from 119 to 126 yesterday. Her K was being repleted. Her NH3 was checked and it was elevated, and her lactulose and Xifaxamin was restarted.  She is still feeling weak but improves each day. Her V/Q showed low probability for PE.   Rewiew of Systems:  Constitutional: Negative for malaise, fever and chills. No significant weight loss or weight gain Eyes: Negative for eye pain, redness and discharge, diplopia, visual changes, or flashes of light. ENMT: Negative for ear pain, hoarseness, nasal congestion, sinus pressure and sore throat. No headaches; tinnitus, drooling, or problem swallowing. Cardiovascular: Negative for chest pain, palpitations, diaphoresis, dyspnea and peripheral edema. ; No orthopnea, PND Respiratory: Negative for cough, hemoptysis, wheezing and stridor. No pleuritic chestpain. Gastrointestinal: Negative for nausea, vomiting, diarrhea, constipation, abdominal pain, melena, blood in stool, hematemesis, jaundice and rectal bleeding.    Genitourinary: Negative for frequency, dysuria, incontinence,flank pain and hematuria; Musculoskeletal: Negative for back pain and neck  pain. Negative for swelling and trauma.;  Skin: . Negative for pruritus, rash, abrasions, bruising and skin lesion.; ulcerations Neuro: Negative for headache, lightheadedness and neck stiffness. Negative for weakness, altered level of consciousness , altered mental status, extremity weakness, burning feet, involuntary movement, seizure and syncope.  Psych: negative for anxiety, depression, insomnia, tearfulness, panic attacks, hallucinations, paranoia, suicidal or homicidal ideation    Past Medical History  Diagnosis Date  . Hypertension   . Enteritis presumed infectious   . Crohn's   . Anemia   . Hepatitis C antibody test positive   . Hepatitis   . Renal insufficiency   . Cirrhosis (HCC)   . Splenomegaly   . Bilateral leg edema 01/2014  . Chronic back pain   . Thrombocytopenia [facility])     Past Surgical History  Procedure Laterality Date  . Colon surgery      MULTIPLE SURGERIES FOR CROHNS  . Ileostomy      multiple abdominal surgeries for Crohn's by Dr. Gabriel Cirri  . Cholecystectomy    . Abscess drainage      abdominal  . Esophagogastroduodenoscopy  10/05/2010    Procedure: ESOPHAGOGASTRODUODENOSCOPY (EGD);  Surgeon: [name], MD;  [facility];  Service: Endoscopy;  Laterality: N/A;  7:30 am  . Cirrhosis    . Esophagogastroduodenoscopy N/A 09/11/2013    Procedure: ESOPHAGOGASTRODUODENOSCOPY (EGD);  Surgeon: [name], MD;  [facility];  Service: Endoscopy;  Laterality: N/A;  . Ileoscopy  09/11/2013    Procedure: ILEOSCOPY THROUGH STOMA;  Surgeon: [name], MD;  [facility];  Service: Endoscopy;;  . Givens capsule study N/A 09/12/2013    Procedure: GIVENS CAPSULE STUDY;  Surgeon: [name], MD;  [facility];  Service: Endoscopy;  Laterality: N/A;  . Givens capsule study N/A 11/25/2013    Procedure: GIVENS CAPSULE STUDY;  Surgeon: [name], MD;  [facility];  Service: Endoscopy;  Laterality: N/A;   . Varices cauterization Right     in the stoma of her ileostomy  . Tips procedure  11/2013    [facility]   . Abdominal surgery      Medications:  HOME MEDS: Prior to Admission medications   Medication Sig Start Date End Date Taking? Authorizing Provider  ALPRAZolam Prudy Feeler) 0.5 MG tablet Take 1 tablet (0.5 mg total) by mouth at bedtime as needed for anxiety. 07/02/15  Yes Rolly Salter, MD  Amino Acids-Protein Hydrolys (FEEDING SUPPLEMENT, PRO-STAT SUGAR FREE 64,) LIQD Take 30 mLs by mouth 3 (three) times daily with meals. 07/02/15  Yes Rolly Salter, MD  Cholecalciferol (VITAMIN D-1000 MAX ST) 1000 UNITS tablet Take 1,000 Units by mouth daily.   Yes Historical Provider, MD  feeding supplement, ENSURE ENLIVE, (ENSURE ENLIVE) LIQD Take 237 mLs by mouth daily. 03/27/15  Yes Standley Brooking, MD  gabapentin (NEURONTIN) 100 MG capsule Take 1 capsule (100 mg total) by mouth 3 (three) times daily. 04/23/15  Yes Ernestina Penna, MD  lactulose (CHRONULAC) 10 GM/15ML solution Take 45 mLs (30 g total) by mouth 3 (three) times daily. 07/02/15  Yes Rolly Salter, MD  mesalamine (CANASA) 1000 MG suppository Place 1 suppository (1,000 mg total) rectally at bedtime. Patient taking differently: Place 1,000 mg rectally at bedtime as needed (chrones).  04/22/15  Yes Erick Blinks, MD  midodrine (PROAMATINE) 5 MG tablet Take 1 tablet (5 mg total) by mouth 3 (three) times daily with meals. 07/02/15  Yes Rolly Salter, MD  Multiple Vitamins-Minerals (MULTIVITAMIN WITH MINERALS) tablet Take 1 tablet by mouth daily.   Yes Historical Provider, MD  pantoprazole (PROTONIX) 40 MG tablet TAKE 1 TABLET BY MOUTH DAILY 06/09/14  Yes Len Blalock, NP  potassium chloride (KLOR-CON) 20 MEQ packet Take 40 mEq by mouth daily. 07/02/15  Yes Rolly Salter, MD  Probiotic Product (PROBIOTIC PO) Take 1 tablet by mouth daily.   Yes Historical Provider, MD  rifaximin (XIFAXAN) 550 MG TABS tablet Take 1 tablet (550 mg total) by mouth 2 (two)  times daily. 07/02/15  Yes Rolly Salter, MD  spironolactone (ALDACTONE) 25 MG tablet Take 2 tablets (50 mg total) by mouth daily. 07/02/15  Yes Rolly Salter, MD  torsemide (DEMADEX) 20 MG tablet Take 3 tablets (60 mg total) by mouth daily. 07/02/15  Yes Rolly Salter, MD  ciprofloxacin (CIPRO) 250 MG tablet Take 1 tablet (250 mg total) by mouth 2 (two) times daily. Patient not taking: Reported on 07/14/2015 07/07/15   Elige Radon Dettinger, MD     Allergies:  Allergies  Allergen Reactions  . Penicillins Rash    Has patient had a PCN reaction causing immediate rash, facial/tongue/throat swelling, SOB or lightheadedness with hypotension: Yes Has patient had a PCN reaction causing severe rash involving mucus membranes or skin necrosis: No Has patient had a PCN reaction that required hospitalization No Has patient had a PCN reaction occurring within the last 10 years: No If all of the above answers are "NO", then may proceed with Cephalosporin use.     Social History:   reports that she has never smoked. She has never used smokeless tobacco. She reports that she does not drink alcohol or use illicit drugs.  Family History: Family  History  Problem Relation Age of Onset  . Cancer Mother   . Stroke Father      Physical Exam: Filed Vitals:   07/15/15 1335 07/15/15 2215 07/16/15 0635 07/16/15 1559  BP: 111/54 114/52 98/47 120/52  Pulse: 80 85 81 77  Temp: 97.9 F (36.6 C) 98 F (36.7 C) 98.2 F (36.8 C) 97.9 F (36.6 C)  TempSrc:  Oral Oral   Resp: 20 20 20 18   Height:      Weight:      SpO2: 100% 100% 100% 100%   Blood pressure 120/52, pulse 77, temperature 97.9 F (36.6 C), temperature source Oral, resp. rate 18, height 5\' 5"  (1.651 m), weight 83.054 kg (183 lb 1.6 oz), SpO2 100 %.  GEN:  Pleasant  patient lying in the stretcher in no acute distress; cooperative with exam. PSYCH:  alert and oriented x4; does not appear anxious or depressed; affect is appropriate. HEENT:  Mucous membranes pink and anicteric; PERRLA; EOM intact; no cervical lymphadenopathy nor thyromegaly or carotid bruit; no JVD; There were no stridor. Neck is very supple. Breasts:: Not examined CHEST WALL: No tenderness CHEST: Normal respiration, clear to auscultation bilaterally.  HEART: Regular rate and rhythm.  There are no murmur, rub, or gallops.   BACK: No kyphosis or scoliosis; no CVA tenderness ABDOMEN: soft and non-tender; no masses, no organomegaly, normal abdominal bowel sounds; no pannus; no intertriginous candida. There is no rebound and no distention. Rectal Exam: Not done EXTREMITIES: No bone or joint deformity; age-appropriate arthropathy of the hands and knees; no edema; no ulcerations.  There is no calf tenderness. Genitalia: not examined PULSES: 2+ and symmetric SKIN: Normal hydration no rash or ulceration CNS: Cranial nerves 2-12 grossly intact no focal lateralizing neurologic deficit.  Speech is fluent; uvula elevated with phonation, facial symmetry and tongue midline. DTR are normal bilaterally, cerebella exam is intact, barbinski is negative and strengths are equaled bilaterally.  No sensory loss.   Labs on Admission:  Basic Metabolic Panel:  Recent Labs Lab 07/14/15 0052 07/14/15 0625 07/15/15 0616  NA 119* 121* 126*  K 3.5 3.3* 3.0*  CL 99* 101 107  CO2 12* 12* 14*  GLUCOSE 132* 106* 149*  BUN 83* 80* 69*  CREATININE 2.34* 2.10* 1.72*  CALCIUM 8.5* 8.4* 8.2*   Liver Function Tests:  Recent Labs Lab 07/14/15 0052 07/14/15 0625  AST 32 27  ALT 21 21  ALKPHOS 170* 147*  BILITOT 1.3* 1.4*  PROT 5.8* 5.2*  ALBUMIN 3.3* 2.9*    Recent Labs Lab 07/15/15 0616  AMMONIA 50*   CBC:  Recent Labs Lab 07/14/15 0052 07/14/15 0625  WBC 7.4 6.5  NEUTROABS 5.3  --   HGB 9.0* 8.5*  HCT 25.2* 24.4*  MCV 87.8 87.8  PLT 91* 98*   Cardiac Enzymes:  Recent Labs Lab 07/14/15 0052  TROPONINI 0.03   Assessment/Plan Present on Admission:  .  Hyponatremia . Hepatic cirrhosis (HCC) . CKD (chronic kidney disease) stage 3, GFR 30-59 ml/min  PLAN:  Hyponatremia: Likely hypovolemic hyponatremia. Will continue with her IVF. D/c fluid restriction. And use Lasix IV PRN to correct free water. Avoid too rapid correction. She is feeling better. Will continue to correct Na to near normal prior to discharge. Will recheck another level tomorrow.   CKD: AKI on CKD, suspect prerenal. Will continue to follow. Cr improved to 1.7.  Hypokalemia: Only slightly, will add K into her IVF.   Hepatic cirrhosis: Stable. Not encephalopathic. NH3 level was  50.  Other plans as per orders. Code Status: DNR.    Houston Siren, MD.  FACP [facility] Pager [phone] 7pm to 7am.  07/16/2015, 5:35 PM

## 2015-07-17 LAB — BASIC METABOLIC PANEL
Anion gap: 3 — ABNORMAL LOW (ref 5–15)
BUN: 53 mg/dL — ABNORMAL HIGH (ref 6–20)
CALCIUM: 7.9 mg/dL — AB (ref 8.9–10.3)
CO2: 12 mmol/L — AB (ref 22–32)
CREATININE: 1.52 mg/dL — AB (ref 0.44–1.00)
Chloride: 113 mmol/L — ABNORMAL HIGH (ref 101–111)
GFR calc Af Amer: 42 mL/min — ABNORMAL LOW (ref >60)
GFR calc non Af Amer: 36 mL/min — ABNORMAL LOW (ref >60)
GLUCOSE: 117 mg/dL — AB (ref 65–99)
POTASSIUM: 3.4 mmol/L — AB (ref 3.5–5.1)
Sodium: 128 mmol/L — ABNORMAL LOW (ref 135–145)

## 2015-07-17 NOTE — Progress Notes (Signed)
[facility] PROGRESS NOTE  Kathryn [name] WYO:[id number] DOB: 01-13-1956    PCP:   Elige Radon Dettinger, MD   HPI:  Kathryn [name] is a 60 y.o. female, With history of hemosiderosis, Crohn disease, hepatic encephalopathy who was recently discharged from the hospital on 07/02/2015 after being treated for hepatic encephalopathy. The patient came to the hospital with shortness of breath, chest x-ray shows no acute abnormality. Revealed hyponatremia with sodium 119, also had elevated d-dimer 2.08. Patient denies nausea vomiting or diarrhea. No chest pain. No fever or chills. No dysuria. She has colostomy in place. She was started on low volume IVF with NS, and her diuretics were discontinued, as it was felt that it was diuretic induced hypovolemic, hyponatremia. She was fluid restricted as well. Her NA came up from 119 to 128 today. Her K was being repleted. Her NH3 was checked and it was elevated, and her lactulose and Xifaxamin was restarted.  She is still feeling weak but improves each day. Her V/Q showed low probability for PE.   Rewiew of Systems:  Constitutional: Negative for malaise, fever and chills. No significant weight loss or weight gain Eyes: Negative for eye pain, redness and discharge, diplopia, visual changes, or flashes of light. ENMT: Negative for ear pain, hoarseness, nasal congestion, sinus pressure and sore throat. No headaches; tinnitus, drooling, or problem swallowing. Cardiovascular: Negative for chest pain, palpitations, diaphoresis, dyspnea and peripheral edema. ; No orthopnea, PND Respiratory: Negative for cough, hemoptysis, wheezing and stridor. No pleuritic chestpain. Gastrointestinal: Negative for nausea, vomiting, diarrhea, constipation, abdominal pain, melena, blood in stool, hematemesis, jaundice and rectal bleeding.    Genitourinary: Negative for frequency, dysuria, incontinence,flank pain and hematuria; Musculoskeletal: Negative for back pain and neck pain.  Negative for swelling and trauma.;  Skin: . Negative for pruritus, rash, abrasions, bruising and skin lesion.; ulcerations Neuro: Negative for headache, lightheadedness and neck stiffness. Negative for weakness, altered level of consciousness , altered mental status, extremity weakness, burning feet, involuntary movement, seizure and syncope.  Psych: negative for anxiety, depression, insomnia, tearfulness, panic attacks, hallucinations, paranoia, suicidal or homicidal ideation    Past Medical History  Diagnosis Date  . Hypertension   . Enteritis presumed infectious   . Crohn's   . Anemia   . Hepatitis C antibody test positive   . Hepatitis   . Renal insufficiency   . Cirrhosis (HCC)   . Splenomegaly   . Bilateral leg edema 01/2014  . Chronic back pain   . Thrombocytopenia [facility])     Past Surgical History  Procedure Laterality Date  . Colon surgery      MULTIPLE SURGERIES FOR CROHNS  . Ileostomy      multiple abdominal surgeries for Crohn's by Dr. Gabriel Cirri  . Cholecystectomy    . Abscess drainage      abdominal  . Esophagogastroduodenoscopy  10/05/2010    Procedure: ESOPHAGOGASTRODUODENOSCOPY (EGD);  Surgeon: [name], MD;  [facility];  Service: Endoscopy;  Laterality: N/A;  7:30 am  . Cirrhosis    . Esophagogastroduodenoscopy N/A 09/11/2013    Procedure: ESOPHAGOGASTRODUODENOSCOPY (EGD);  Surgeon: [name], MD;  [facility];  Service: Endoscopy;  Laterality: N/A;  . Ileoscopy  09/11/2013    Procedure: ILEOSCOPY THROUGH STOMA;  Surgeon: [name], MD;  [facility];  Service: Endoscopy;;  . Givens capsule study N/A 09/12/2013    Procedure: GIVENS CAPSULE STUDY;  Surgeon: [name], MD;  [facility];  Service: Endoscopy;  Laterality: N/A;  . Givens capsule study N/A 11/25/2013    Procedure: GIVENS CAPSULE STUDY;  Surgeon: [name], MD;  [facility];  Service: Endoscopy;  Laterality: N/A;  .  Varices cauterization Right     in the stoma of her ileostomy  . Tips procedure  11/2013    [facility]   . Abdominal surgery      Medications:  HOME MEDS: Prior to Admission medications   Medication Sig Start Date End Date Taking? Authorizing Provider  ALPRAZolam Prudy Feeler) 0.5 MG tablet Take 1 tablet (0.5 mg total) by mouth at bedtime as needed for anxiety. 07/02/15  Yes Rolly Salter, MD  Amino Acids-Protein Hydrolys (FEEDING SUPPLEMENT, PRO-STAT SUGAR FREE 64,) LIQD Take 30 mLs by mouth 3 (three) times daily with meals. 07/02/15  Yes Rolly Salter, MD  Cholecalciferol (VITAMIN D-1000 MAX ST) 1000 UNITS tablet Take 1,000 Units by mouth daily.   Yes Historical Provider, MD  feeding supplement, ENSURE ENLIVE, (ENSURE ENLIVE) LIQD Take 237 mLs by mouth daily. 03/27/15  Yes Standley Brooking, MD  gabapentin (NEURONTIN) 100 MG capsule Take 1 capsule (100 mg total) by mouth 3 (three) times daily. 04/23/15  Yes Ernestina Penna, MD  lactulose (CHRONULAC) 10 GM/15ML solution Take 45 mLs (30 g total) by mouth 3 (three) times daily. 07/02/15  Yes Rolly Salter, MD  mesalamine (CANASA) 1000 MG suppository Place 1 suppository (1,000 mg total) rectally at bedtime. Patient taking differently: Place 1,000 mg rectally at bedtime as needed (chrones).  04/22/15  Yes Erick Blinks, MD  midodrine (PROAMATINE) 5 MG tablet Take 1 tablet (5 mg total) by mouth 3 (three) times daily with meals. 07/02/15  Yes Rolly Salter, MD  Multiple Vitamins-Minerals (MULTIVITAMIN WITH MINERALS) tablet Take 1 tablet by mouth daily.   Yes Historical Provider, MD  pantoprazole (PROTONIX) 40 MG tablet TAKE 1 TABLET BY MOUTH DAILY 06/09/14  Yes Len Blalock, NP  potassium chloride (KLOR-CON) 20 MEQ packet Take 40 mEq by mouth daily. 07/02/15  Yes Rolly Salter, MD  Probiotic Product (PROBIOTIC PO) Take 1 tablet by mouth daily.   Yes Historical Provider, MD  rifaximin (XIFAXAN) 550 MG TABS tablet Take 1 tablet (550 mg total) by mouth 2 (two)  times daily. 07/02/15  Yes Rolly Salter, MD  spironolactone (ALDACTONE) 25 MG tablet Take 2 tablets (50 mg total) by mouth daily. 07/02/15  Yes Rolly Salter, MD  torsemide (DEMADEX) 20 MG tablet Take 3 tablets (60 mg total) by mouth daily. 07/02/15  Yes Rolly Salter, MD  ciprofloxacin (CIPRO) 250 MG tablet Take 1 tablet (250 mg total) by mouth 2 (two) times daily. Patient not taking: Reported on 07/14/2015 07/07/15   Elige Radon Dettinger, MD     Allergies:  Allergies  Allergen Reactions  . Penicillins Rash    Has patient had a PCN reaction causing immediate rash, facial/tongue/throat swelling, SOB or lightheadedness with hypotension: Yes Has patient had a PCN reaction causing severe rash involving mucus membranes or skin necrosis: No Has patient had a PCN reaction that required hospitalization No Has patient had a PCN reaction occurring within the last 10 years: No If all of the above answers are "NO", then may proceed with Cephalosporin use.     Social History:   reports that she has never smoked. She has never used smokeless tobacco. She reports that she does not drink alcohol or use illicit drugs.  Family History: Family  History  Problem Relation Age of Onset  . Cancer Mother   . Stroke Father      Physical Exam: Filed Vitals:   07/16/15 1559 07/16/15 2324 07/17/15 0525 07/17/15 1332  BP: 120/52 126/69 105/50 114/67  Pulse: 77 90 79 74  Temp: 97.9 F (36.6 C) 98.3 F (36.8 C) 98.2 F (36.8 C) 98.6 F (37 C)  TempSrc:  Oral Oral Oral  Resp: 18 20 20 20   Height:      Weight:      SpO2: 100% 100% 99% 98%   Blood pressure 114/67, pulse 74, temperature 98.6 F (37 C), temperature source Oral, resp. rate 20, height 5\' 5"  (1.651 m), weight 83.054 kg (183 lb 1.6 oz), SpO2 98 %.  GEN:  Pleasant patient lying in the stretcher in no acute distress; cooperative with exam. PSYCH:  alert and oriented x4; does not appear anxious or depressed; affect is appropriate. HEENT: Mucous  membranes pink and anicteric; PERRLA; EOM intact; no cervical lymphadenopathy nor thyromegaly or carotid bruit; no JVD; There were no stridor. Neck is very supple. Breasts:: Not examined CHEST WALL: No tenderness CHEST: Normal respiration, clear to auscultation bilaterally.  HEART: Regular rate and rhythm.  There are no murmur, rub, or gallops.   BACK: No kyphosis or scoliosis; no CVA tenderness ABDOMEN: soft and non-tender; no masses, no organomegaly, normal abdominal bowel sounds; no pannus; no intertriginous candida. There is no rebound and no distention. Rectal Exam: Not done EXTREMITIES: No bone or joint deformity; age-appropriate arthropathy of the hands and knees; no edema; no ulcerations.  There is no calf tenderness. Genitalia: not examined PULSES: 2+ and symmetric SKIN: Normal hydration no rash or ulceration CNS: Cranial nerves 2-12 grossly intact no focal lateralizing neurologic deficit.  Speech is fluent; uvula elevated with phonation, facial symmetry and tongue midline. DTR are normal bilaterally, cerebella exam is intact, barbinski is negative and strengths are equaled bilaterally.  No sensory loss.   Labs on Admission:  Basic Metabolic Panel:  Recent Labs Lab 07/14/15 0052 07/14/15 0625 07/15/15 0616 07/17/15 0510  NA 119* 121* 126* 128*  K 3.5 3.3* 3.0* 3.4*  CL 99* 101 107 113*  CO2 12* 12* 14* 12*  GLUCOSE 132* 106* 149* 117*  BUN 83* 80* 69* 53*  CREATININE 2.34* 2.10* 1.72* 1.52*  CALCIUM 8.5* 8.4* 8.2* 7.9*   Liver Function Tests:  Recent Labs Lab 07/14/15 0052 07/14/15 0625  AST 32 27  ALT 21 21  ALKPHOS 170* 147*  BILITOT 1.3* 1.4*  PROT 5.8* 5.2*  ALBUMIN 3.3* 2.9*    Recent Labs Lab 07/15/15 0616  AMMONIA 50*   CBC:  Recent Labs Lab 07/14/15 0052 07/14/15 0625  WBC 7.4 6.5  NEUTROABS 5.3  --   HGB 9.0* 8.5*  HCT 25.2* 24.4*  MCV 87.8 87.8  PLT 91* 98*   Cardiac Enzymes:  Recent Labs Lab 07/14/15 0052  TROPONINI 0.03    Assessment/Plan Present on Admission:  . Hyponatremia . Hepatic cirrhosis (HCC) . CKD (chronic kidney disease) stage 3, GFR 30-59 ml/min  PLAN:  Hyponatremia:  Improving.  Will continue with IVF.  Will give IV lasix x 1 today.   Hepatic cirrhosis:  Continue with lactulose.   AKI:  Resolving with IVF.    Other plans as per orders. Code Status: FULL Unk Lightning, MD.  FACP [facility] Pager [phone] 7pm to 7am.  07/17/2015, 2:09 PM

## 2015-07-18 DIAGNOSIS — K7030 Alcoholic cirrhosis of liver without ascites: Secondary | ICD-10-CM

## 2015-07-18 LAB — BASIC METABOLIC PANEL
Anion gap: 3 — ABNORMAL LOW (ref 5–15)
BUN: 44 mg/dL — AB (ref 6–20)
CO2: 9 mmol/L — ABNORMAL LOW (ref 22–32)
Calcium: 7.9 mg/dL — ABNORMAL LOW (ref 8.9–10.3)
Chloride: 118 mmol/L — ABNORMAL HIGH (ref 101–111)
Creatinine, Ser: 1.35 mg/dL — ABNORMAL HIGH (ref 0.44–1.00)
GFR calc Af Amer: 49 mL/min — ABNORMAL LOW (ref >60)
GFR, EST NON AFRICAN AMERICAN: 42 mL/min — AB (ref >60)
GLUCOSE: 108 mg/dL — AB (ref 65–99)
POTASSIUM: 2.8 mmol/L — AB (ref 3.5–5.1)
Sodium: 130 mmol/L — ABNORMAL LOW (ref 135–145)

## 2015-07-18 NOTE — Progress Notes (Signed)
Patient with orders to be discharge home. Discharge instructions given, patient verbalized understanding. Patient stable. Patient left in private vehicle with family.  

## 2015-07-18 NOTE — Discharge Summary (Signed)
Physician Discharge Summary  Kathryn [name] ZOX:[id number] DOB: 1955-06-29 DOA: 07/13/2015  PCP: Elige Radon Dettinger, MD  Admit date: 07/13/2015 Discharge date: 07/18/2015  Time spent:35 minutes  Recommendations for Outpatient Follow-up:  1. Follow up with PCP in one week.    Discharge Diagnoses:  Active Problems:   Hepatic cirrhosis (HCC)   Hyponatremia   CKD (chronic kidney disease) stage 3, GFR 30-59 ml/min   D-dimer, elevated   Dyspnea   Discharge Condition: much improved.  No weakness.  Na at 130  Diet recommendation: as tolerated.   Filed Weights   07/13/15 2354 07/14/15 0331  Weight: 84.823 kg (187 lb) 83.054 kg (183 lb 1.6 oz)    History of present illness: patient was admitted by Dr Sharl Ma on July 14, 2015 for AKI, volume depletion, and hyponatremia with weakness.  As per his H and P:  " Kathryn [name] is a 60 y.o. female, With history of hemosiderosis, Crohn disease, hepatic encephalopathy who was recently discharged from the hospital on 07/02/2015 after being treated for hepatic encephalopathy. The patient came to the hospital with shortness of breath, chest x-ray shows no acute abnormality. Revealed hyponatremia with sodium 119, also had elevated d-dimer 2.08. Patient denies nausea vomiting or diarrhea. No chest pain. No fever or chills. No dysuria. She has colostomy in place.  Hospital Course: It was felt that her hyponatremia was hypovolemic hyponatremia, and her fluid restriction was promptly discontinued.  She was given gentle IV rehydration with NS, and her Na was brought up slowly over a few days, starting with admission value of 119, to the discharge value of 130.  Her diuretics were discontinued.  She felt better and stronger.  Her NH3 level was slightly elevated to 50, and her lactulose and Xifacamin were both continued.  She was not given salt tablets given her hx of cirrhosis, and she will likely at some point requires resuming of her diuretics.  Therefore, her  spinorolactone was resume on admission, but Demadex was not given.  She needs to see her PCP next week, and will need to continue with her fluid balance and electrolytes monitoring.  She is anxious to go home, and is stable for discharge.  As for her elevated D dimer, though unlikely, a V/Q scan was performed showing low probability for PE.  She also had low K and this was replaced as well.    Thank you and Good Day.   Procedures: V/Q scan.   Low prob for PE>   Consultations:  None.  Discharge Exam: Filed Vitals:   07/17/15 2142 07/18/15 0525  BP: 111/43 111/49  Pulse: 87 84  Temp: 98.7 F (37.1 C) 99 F (37.2 C)  Resp: 20 20     Discharge Instructions    Diet - low sodium heart healthy    Complete by:  As directed      Discharge instructions    Complete by:  As directed   Follow up with your PCP next week.  Please take your medicine as instructed.     Increase activity slowly    Complete by:  As directed           Current Discharge Medication List    CONTINUE these medications which have NOT CHANGED   Details  ALPRAZolam (XANAX) 0.5 MG tablet Take 1 tablet (0.5 mg total) by mouth at bedtime as needed for anxiety. Qty: 10 tablet, Refills: 0    Amino Acids-Protein Hydrolys (FEEDING SUPPLEMENT, PRO-STAT SUGAR FREE 64,) LIQD Take  30 mLs by mouth 3 (three) times daily with meals. Qty: 900 mL, Refills: 0    feeding supplement, ENSURE ENLIVE, (ENSURE ENLIVE) LIQD Take 237 mLs by mouth daily.    gabapentin (NEURONTIN) 100 MG capsule Take 1 capsule (100 mg total) by mouth 3 (three) times daily. Qty: 270 capsule, Refills: 0    lactulose (CHRONULAC) 10 GM/15ML solution Take 45 mLs (30 g total) by mouth 3 (three) times daily. Qty: 1892 mL, Refills: 0    mesalamine (CANASA) 1000 MG suppository Place 1 suppository (1,000 mg total) rectally at bedtime. Qty: 30 suppository, Refills: 12    midodrine (PROAMATINE) 5 MG tablet Take 1 tablet (5 mg total) by mouth 3 (three) times  daily with meals. Qty: 20 tablet, Refills: 0    pantoprazole (PROTONIX) 40 MG tablet TAKE 1 TABLET BY MOUTH DAILY Qty: 30 tablet, Refills: 3    potassium chloride (KLOR-CON) 20 MEQ packet Take 40 mEq by mouth daily. Qty: 30 packet, Refills: 0    Probiotic Product (PROBIOTIC PO) Take 1 tablet by mouth daily.    rifaximin (XIFAXAN) 550 MG TABS tablet Take 1 tablet (550 mg total) by mouth 2 (two) times daily. Qty: 42 tablet, Refills: 0    spironolactone (ALDACTONE) 25 MG tablet Take 2 tablets (50 mg total) by mouth daily. Qty: 60 tablet, Refills: 0    ciprofloxacin (CIPRO) 250 MG tablet Take 1 tablet (250 mg total) by mouth 2 (two) times daily. Qty: 14 tablet, Refills: 0   Associated Diagnoses: Acute cystitis without hematuria      STOP taking these medications     Cholecalciferol (VITAMIN D-1000 MAX ST) 1000 UNITS tablet      Multiple Vitamins-Minerals (MULTIVITAMIN WITH MINERALS) tablet      torsemide (DEMADEX) 20 MG tablet        Allergies  Allergen Reactions  . Penicillins Rash    Has patient had a PCN reaction causing immediate rash, facial/tongue/throat swelling, SOB or lightheadedness with hypotension: Yes Has patient had a PCN reaction causing severe rash involving mucus membranes or skin necrosis: No Has patient had a PCN reaction that required hospitalization No Has patient had a PCN reaction occurring within the last 10 years: No If all of the above answers are "NO", then may proceed with Cephalosporin use.       The results of significant diagnostics from this hospitalization (including imaging, microbiology, ancillary and laboratory) are listed below for reference.    Significant Diagnostic Studies: Dg Chest 2 View  07/14/2015  CLINICAL DATA:  60 year old female with shortness of breath EXAM: CHEST  2 VIEW COMPARISON:  Chest radiograph dated 06/10/2015 FINDINGS: The heart size and mediastinal contours are within normal limits. Both lungs are clear. The  visualized skeletal structures are unremarkable. IMPRESSION: No active cardiopulmonary disease. Electronically Signed   By: Elgie Collard M.D.   On: 07/14/2015 00:58   Nm Pulmonary Perf And Vent  07/14/2015  CLINICAL DATA:  Shortness of breath and persistent cough. Inpatient. EXAM: NUCLEAR MEDICINE VENTILATION - PERFUSION LUNG SCAN TECHNIQUE: Ventilation images were obtained in multiple projections using inhaled aerosol Tc-29m DTPA. Perfusion images were obtained in multiple projections after intravenous injection of Tc-51m MAA. RADIOPHARMACEUTICALS:  33 mCi Technetium-60m DTPA aerosol inhalation and 4.4 mCi Technetium-71m MAA IV COMPARISON:  Chest radiograph from earlier today. FINDINGS: Ventilation: There is accumulation of radiotracer in the central airways with heterogeneous ventilation of both lungs. Perfusion: There are two small segmental matched perfusion defects in the right upper lung. Otherwise  normal perfusion. IMPRESSION: Very low probability for pulmonary embolism (0-9%). Electronically Signed   By: Delbert Phenix M.D.   On: 07/14/2015 08:29   US Venous Img Lower Unilateral Right  07/01/2015  CLINICAL DATA:  60 year old female with a history of edema EXAM: RIGHT LOWER EXTREMITY VENOUS DOPPLER ULTRASOUND TECHNIQUE: Gray-scale sonography with graded compression, as well as color Doppler and duplex ultrasound were performed to evaluate the lower extremity deep venous systems from the level of the common femoral vein and including the common femoral, femoral, profunda femoral, popliteal and calf veins including the posterior tibial, peroneal and gastrocnemius veins when visible. The superficial great saphenous vein was also interrogated. Spectral Doppler was utilized to evaluate flow at rest and with distal augmentation maneuvers in the common femoral, femoral and popliteal veins. COMPARISON:  None. FINDINGS: Contralateral Common Femoral Vein: Respiratory phasicity is normal and symmetric with the  symptomatic side. No evidence of thrombus. Normal compressibility. Common Femoral Vein: No evidence of thrombus. Normal compressibility, respiratory phasicity and response to augmentation. Saphenofemoral Junction: No evidence of thrombus. Normal compressibility and flow on color Doppler imaging. Profunda Femoral Vein: No evidence of thrombus. Normal compressibility and flow on color Doppler imaging. Femoral Vein: No evidence of thrombus. Normal compressibility, respiratory phasicity and response to augmentation. Popliteal Vein: No evidence of thrombus. Normal compressibility, respiratory phasicity and response to augmentation. Calf Veins: No evidence of thrombus. Normal compressibility and flow on color Doppler imaging. Superficial Great Saphenous Vein: No evidence of thrombus. Normal compressibility and flow on color Doppler imaging. Other Findings:  None. IMPRESSION: Sonographic survey of the right lower extremity negative for DVT. Signed, Yvone Neu. Loreta Ave, DO [facility] Electronically Signed   By: Gilmer Mor D.O.   On: 07/01/2015 15:06    Microbiology: No results found for this or any previous visit (from the past 240 hour(s)).   Labs: Basic Metabolic Panel:  Recent Labs Lab 07/14/15 0052 07/14/15 0625 07/15/15 0616 07/17/15 0510 07/18/15 0907  NA 119* 121* 126* 128* 130*  K 3.5 3.3* 3.0* 3.4* 2.8*  CL 99* 101 107 113* 118*  CO2 12* 12* 14* 12* 9*  GLUCOSE 132* 106* 149* 117* 108*  BUN 83* 80* 69* 53* 44*  CREATININE 2.34* 2.10* 1.72* 1.52* 1.35*  CALCIUM 8.5* 8.4* 8.2* 7.9* 7.9*   Liver Function Tests:  Recent Labs Lab 07/14/15 0052 07/14/15 0625  AST 32 27  ALT 21 21  ALKPHOS 170* 147*  BILITOT 1.3* 1.4*  PROT 5.8* 5.2*  ALBUMIN 3.3* 2.9*    Recent Labs Lab 07/15/15 0616  AMMONIA 50*   CBC:  Recent Labs Lab 07/14/15 0052 07/14/15 0625  WBC 7.4 6.5  NEUTROABS 5.3  --   HGB 9.0* 8.5*  HCT 25.2* 24.4*   MCV 87.8 87.8  PLT 91* 98*   Cardiac Enzymes:  Recent Labs Lab 07/14/15 0052  TROPONINI 0.03   BNP: BNP (last 3 results)  Recent Labs  03/26/15 0345 07/14/15 0059  BNP 74.0 44.0    Signed:  Jjesus Dingley MD. Jerrel Ivory. [facility] 07/18/2015, 12:23 PM

## 2015-07-20 ENCOUNTER — Encounter (HOSPITAL_COMMUNITY): Payer: Self-pay | Admitting: Oncology

## 2015-07-20 ENCOUNTER — Other Ambulatory Visit (HOSPITAL_COMMUNITY): Payer: Medicare HMO

## 2015-07-20 ENCOUNTER — Ambulatory Visit (HOSPITAL_COMMUNITY): Payer: Medicare HMO | Admitting: Oncology

## 2015-07-20 NOTE — Assessment & Plan Note (Deleted)
Iron deficiency with intolerance to oral iron in the setting of pancytopenia with esophageal varices, hepatitis C with cirrhosis and splenomegaly (S/P Harvoni Tx in 09/2013), and Crohn's Disease S/P ileostomy.  Labs today: CBC diff, iron/TIBC, ferritin.  I personally reviewed and went over laboratory results with the patient.  The results are noted within this dictation.  Oncology Flowsheet 10/24/2014  ferumoxytol (FERAHEME) IV 510 mg   Labs in 3 and 6 months: CBC diff, iron/TIBC, ferritin

## 2015-07-20 NOTE — Assessment & Plan Note (Deleted)
Anemia of chronic renal disease, Stage III-IV, followed by Salomon Mast, MD (Nephrology).  Labs today: EPO level.  Given her progressive anemia and worsening renal function, she is a candidate for Aranesp at 0.75 mcg/kg every 2 weeks.  If her iron is completely replaced, we will submit for insurance approval for Aranesp.  She is provided education regarding aranesp therapy including the risks, benefits, alternatives, and side effects including, but not limited to, HTN exacerbation and VTE.  She DOES NOT have baseline HTN.  She is not currently on any anti-hypertensive therapy.  Supportive therapy plan built for Aranesp.  Return in ~ 1 week to start therapy (pending ferritin results today).  She will then return every 2 weeks for labs and injection.  Labs every 2 weeks: CBC.  Return in 6-8 weeks for follow-up, re-evaluation of response to ESA therapy, and dose adjustment if needed.

## 2015-07-20 NOTE — Progress Notes (Signed)
NO SHOW  ROS  

## 2015-07-21 NOTE — Telephone Encounter (Signed)
Erroneous Encounter

## 2015-07-23 ENCOUNTER — Encounter: Payer: Self-pay | Admitting: Family Medicine

## 2015-07-23 ENCOUNTER — Ambulatory Visit (INDEPENDENT_AMBULATORY_CARE_PROVIDER_SITE_OTHER): Payer: Medicare HMO | Admitting: Family Medicine

## 2015-07-23 VITALS — BP 99/63 | HR 81 | Temp 97.1°F | Ht 65.0 in | Wt 190.6 lb

## 2015-07-23 DIAGNOSIS — K7030 Alcoholic cirrhosis of liver without ascites: Secondary | ICD-10-CM

## 2015-07-23 DIAGNOSIS — D631 Anemia in chronic kidney disease: Secondary | ICD-10-CM

## 2015-07-23 DIAGNOSIS — E871 Hypo-osmolality and hyponatremia: Secondary | ICD-10-CM

## 2015-07-23 DIAGNOSIS — N183 Chronic kidney disease, stage 3 (moderate): Secondary | ICD-10-CM

## 2015-07-23 DIAGNOSIS — R0602 Shortness of breath: Secondary | ICD-10-CM

## 2015-07-23 MED ORDER — FUROSEMIDE 40 MG PO TABS: 40.0000 mg | ORAL_TABLET | Freq: Every day | ORAL | Status: AC

## 2015-07-23 NOTE — Progress Notes (Signed)
BP 99/63 mmHg  Pulse 81  Temp(Src) 97.1 F (36.2 C) (Oral)  Ht  (1.651 m)  Wt 190 lb 9.6 oz (86.456 kg)  BMI 31.72 kg/m2   Subjective:    Patient ID: Kathryn [name], female    DOB: [birth year], 60 y.o.   MRN: [id number]  HPI: Kathryn [name] is a 60 y.o. female presenting on 07/23/2015 for Hospitalization Follow-up   HPI Hospital follow-up for cirrhosis and fluid overload and shortness of breath and renal failure Patient is coming in today for hospital follow-up for shortness of breath and fluid overload and cirrhosis and renal failure. She hasn't been using Lasix to help with the fluid which sometimes increases her renal counts. She was in the hospital for 1 week and was discharged 4 days ago. Patient has known chronic hepatic cirrhosis and has been in and out of the hospital for 5 times in the past 6 months. She gets fluid overloaded and short of breath and then she'll get fluid under loaded and dehydrated. She is also been in the hospital for mental confusion and hepatic encephalopathy. She was told that this last hospital visit that she is not a candidate for liver transplant. She does see a gastroenterologist for her liver management but missed the appointment because she was in the hospital. He denies any shortness of breath today and says that her fluid status is been much improved from the previous.  Relevant past medical, surgical, family and social history reviewed and updated as indicated. Interim medical history since our last visit reviewed. Allergies and medications reviewed and updated.  Review of Systems  Constitutional: Negative for fever and chills.  HENT: Negative for congestion, ear discharge and ear pain.   Eyes: Negative for redness and visual disturbance.  Respiratory: Positive for cough. Negative for chest tightness and shortness of breath.   Cardiovascular: Positive for leg swelling. Negative for chest pain.  Gastrointestinal: Negative for abdominal pain.    Genitourinary: Negative for dysuria and difficulty urinating.  Musculoskeletal: Negative for back pain and gait problem.  Skin: Negative for rash.  Neurological: Negative for dizziness, light-headedness and headaches.  Psychiatric/Behavioral: Negative for behavioral problems and agitation.  All other systems reviewed and are negative.   Per HPI unless specifically indicated above     Medication List       This list is accurate as of: 07/23/15 11:15 AM.  Always use your most recent med list.               ALPRAZolam 0.5 MG tablet  Commonly known as:  XANAX  Take 1 tablet (0.5 mg total) by mouth at bedtime as needed for anxiety.     ciprofloxacin 250 MG tablet  Commonly known as:  CIPRO  Take 1 tablet (250 mg total) by mouth 2 (two) times daily.     feeding supplement (ENSURE ENLIVE) Liqd  Take 237 mLs by mouth daily.     feeding supplement (PRO-STAT SUGAR FREE 64) Liqd  Take 30 mLs by mouth 3 (three) times daily with meals.     furosemide 40 MG tablet  Commonly known as:  LASIX  Take 1 tablet (40 mg total) by mouth daily.     gabapentin 100 MG capsule  Commonly known as:  NEURONTIN  Take 1 capsule (100 mg total) by mouth 3 (three) times daily.     lactulose 10 GM/15ML solution  Commonly known as:  CHRONULAC  Take 45 mLs (30 g total) by mouth 3 (  three) times daily.     mesalamine 1000 MG suppository  Commonly known as:  CANASA  Place 1 suppository (1,000 mg total) rectally at bedtime.     midodrine 5 MG tablet  Commonly known as:  PROAMATINE  Take 1 tablet (5 mg total) by mouth 3 (three) times daily with meals.     pantoprazole 40 MG tablet  Commonly known as:  PROTONIX  TAKE 1 TABLET BY MOUTH DAILY     potassium chloride 20 MEQ packet  Commonly known as:  KLOR-CON  Take 40 mEq by mouth daily.     PROBIOTIC PO  Take 1 tablet by mouth daily.     rifaximin 550 MG Tabs tablet  Commonly known as:  XIFAXAN  Take 1 tablet (550 mg total) by mouth 2 (two)  times daily.     spironolactone 25 MG tablet  Commonly known as:  ALDACTONE  Take 2 tablets (50 mg total) by mouth daily.           Objective:    BP 99/63 mmHg  Pulse 81  Temp(Src) 97.1 F (36.2 C) (Oral)  Ht 5\' 5"  (1.651 m)  Wt 190 lb 9.6 oz (86.456 kg)  BMI 31.72 kg/m2  Wt Readings from Last 3 Encounters:  07/23/15 190 lb 9.6 oz (86.456 kg)  07/14/15 183 lb 1.6 oz (83.054 kg)  07/07/15 186 lb (84.369 kg)    Physical Exam  Constitutional: She is oriented to person, place, and time. She appears well-developed and well-nourished. No distress.  Eyes: Conjunctivae and EOM are normal. Pupils are equal, round, and reactive to light.  Neck: Neck supple. No thyromegaly present.  Cardiovascular: Normal rate, regular rhythm, normal heart sounds and intact distal pulses.   No murmur heard. Pulmonary/Chest: Effort normal and breath sounds normal. No respiratory distress. She has no wheezes.  Abdominal: Soft. Bowel sounds are normal. She exhibits no distension. There is no tenderness. There is no rebound.  Musculoskeletal: Normal range of motion. She exhibits edema (2+). She exhibits no tenderness.  Lymphadenopathy:    She has no cervical adenopathy.  Neurological: She is alert and oriented to person, place, and time. Coordination normal.  Skin: Skin is warm and dry. No rash noted. She is not diaphoretic.  Psychiatric: She has a normal mood and affect. Her behavior is normal.  Nursing note and vitals reviewed.      Assessment & Plan:   Problem List Items Addressed This Visit      Digestive   Hepatic cirrhosis (HCC) - Primary     Genitourinary   Anemia of chronic renal failure, stage 3 (moderate)     Other   Hyponatremia    Other Visit Diagnoses    Shortness of breath        She was in the hospital for shortness of breath but is now denying any shortness of breath today.    Relevant Medications    furosemide (LASIX) 40 MG tablet       Follow up plan: Return in about  4 weeks (around 08/20/2015), or if symptoms worsen or fail to improve, for Recheck swelling and shortness of breath and cirrhosis.  Counseling provided for all of the vaccine components No orders of the defined types were placed in this encounter.    Arville Care, MD [facility] 07/23/2015, 11:15 AM

## 2015-07-24 ENCOUNTER — Other Ambulatory Visit: Payer: Self-pay | Admitting: Family Medicine

## 2015-07-24 ENCOUNTER — Telehealth: Payer: Self-pay | Admitting: Family Medicine

## 2015-07-24 ENCOUNTER — Other Ambulatory Visit: Payer: Self-pay

## 2015-07-24 MED ORDER — POTASSIUM CHLORIDE 20 MEQ PO PACK: 40.0000 meq | PACK | Freq: Every day | ORAL | Status: AC

## 2015-07-24 NOTE — Telephone Encounter (Signed)
Was sent to pharmacy with other meds but must have not went through. Sent again

## 2015-07-26 ENCOUNTER — Other Ambulatory Visit: Payer: Self-pay

## 2015-07-26 ENCOUNTER — Encounter (HOSPITAL_COMMUNITY): Payer: Self-pay | Admitting: Emergency Medicine

## 2015-07-26 ENCOUNTER — Inpatient Hospital Stay (HOSPITAL_COMMUNITY)
Admission: EM | Admit: 2015-07-26 | Discharge: 2015-07-29 | DRG: 683 | Disposition: A | Discharge status: Hospice | Payer: Medicare HMO | Attending: Internal Medicine | Admitting: Internal Medicine

## 2015-07-26 ENCOUNTER — Other Ambulatory Visit (HOSPITAL_COMMUNITY): Payer: Self-pay

## 2015-07-26 DIAGNOSIS — B1920 Unspecified viral hepatitis C without hepatic coma: Secondary | ICD-10-CM | POA: Diagnosis present

## 2015-07-26 DIAGNOSIS — Z932 Ileostomy status: Secondary | ICD-10-CM

## 2015-07-26 DIAGNOSIS — K7030 Alcoholic cirrhosis of liver without ascites: Secondary | ICD-10-CM | POA: Diagnosis not present

## 2015-07-26 DIAGNOSIS — K7460 Unspecified cirrhosis of liver: Secondary | ICD-10-CM | POA: Diagnosis present

## 2015-07-26 DIAGNOSIS — E46 Unspecified protein-calorie malnutrition: Secondary | ICD-10-CM | POA: Diagnosis present

## 2015-07-26 DIAGNOSIS — G8929 Other chronic pain: Secondary | ICD-10-CM | POA: Diagnosis present

## 2015-07-26 DIAGNOSIS — D696 Thrombocytopenia, unspecified: Secondary | ICD-10-CM | POA: Diagnosis present

## 2015-07-26 DIAGNOSIS — Z6830 Body mass index (BMI) 30.0-30.9, adult: Secondary | ICD-10-CM

## 2015-07-26 DIAGNOSIS — Z9119 Patient's noncompliance with other medical treatment and regimen: Secondary | ICD-10-CM | POA: Diagnosis not present

## 2015-07-26 DIAGNOSIS — Z515 Encounter for palliative care: Secondary | ICD-10-CM | POA: Insufficient documentation

## 2015-07-26 DIAGNOSIS — I129 Hypertensive chronic kidney disease with stage 1 through stage 4 chronic kidney disease, or unspecified chronic kidney disease: Secondary | ICD-10-CM | POA: Diagnosis present

## 2015-07-26 DIAGNOSIS — E860 Dehydration: Secondary | ICD-10-CM | POA: Diagnosis present

## 2015-07-26 DIAGNOSIS — E876 Hypokalemia: Secondary | ICD-10-CM

## 2015-07-26 DIAGNOSIS — K7290 Hepatic failure, unspecified without coma: Secondary | ICD-10-CM | POA: Diagnosis present

## 2015-07-26 DIAGNOSIS — R4182 Altered mental status, unspecified: Secondary | ICD-10-CM | POA: Diagnosis present

## 2015-07-26 DIAGNOSIS — Z7189 Other specified counseling: Secondary | ICD-10-CM | POA: Insufficient documentation

## 2015-07-26 DIAGNOSIS — K5090 Crohn's disease, unspecified, without complications: Secondary | ICD-10-CM | POA: Diagnosis present

## 2015-07-26 DIAGNOSIS — N179 Acute kidney failure, unspecified: Principal | ICD-10-CM | POA: Diagnosis present

## 2015-07-26 DIAGNOSIS — K7682 Hepatic encephalopathy: Secondary | ICD-10-CM

## 2015-07-26 DIAGNOSIS — R739 Hyperglycemia, unspecified: Secondary | ICD-10-CM | POA: Diagnosis present

## 2015-07-26 DIAGNOSIS — D631 Anemia in chronic kidney disease: Secondary | ICD-10-CM | POA: Diagnosis present

## 2015-07-26 DIAGNOSIS — E871 Hypo-osmolality and hyponatremia: Secondary | ICD-10-CM

## 2015-07-26 DIAGNOSIS — N183 Chronic kidney disease, stage 3 unspecified: Secondary | ICD-10-CM | POA: Diagnosis present

## 2015-07-26 DIAGNOSIS — Z66 Do not resuscitate: Secondary | ICD-10-CM | POA: Diagnosis present

## 2015-07-26 DIAGNOSIS — Z880 Allergy status to penicillin: Secondary | ICD-10-CM | POA: Diagnosis not present

## 2015-07-26 DIAGNOSIS — M549 Dorsalgia, unspecified: Secondary | ICD-10-CM | POA: Diagnosis present

## 2015-07-26 LAB — URINALYSIS, ROUTINE W REFLEX MICROSCOPIC
BILIRUBIN URINE: NEGATIVE
GLUCOSE, UA: NEGATIVE mg/dL
KETONES UR: NEGATIVE mg/dL
Nitrite: NEGATIVE
PROTEIN: NEGATIVE mg/dL
Specific Gravity, Urine: 1.01 (ref 1.005–1.030)
pH: 6 (ref 5.0–8.0)

## 2015-07-26 LAB — CBC WITH DIFFERENTIAL/PLATELET
BASOS ABS: 0 10*3/uL (ref 0.0–0.1)
BASOS PCT: 0 %
EOS ABS: 0.1 10*3/uL (ref 0.0–0.7)
Eosinophils Relative: 1 %
HEMATOCRIT: 25.6 % — AB (ref 36.0–46.0)
Hemoglobin: 9.1 g/dL — ABNORMAL LOW (ref 12.0–15.0)
Lymphocytes Relative: 8 %
Lymphs Abs: 0.9 10*3/uL (ref 0.7–4.0)
MCH: 30.7 pg (ref 26.0–34.0)
MCHC: 35.5 g/dL (ref 30.0–36.0)
MCV: 86.5 fL (ref 78.0–100.0)
MONO ABS: 0.7 10*3/uL (ref 0.1–1.0)
MONOS PCT: 7 %
NEUTROS ABS: 9.3 10*3/uL — AB (ref 1.7–7.7)
NEUTROS PCT: 85 %
Platelets: 85 10*3/uL — ABNORMAL LOW (ref 150–400)
RBC: 2.96 MIL/uL — ABNORMAL LOW (ref 3.87–5.11)
RDW: 16.8 % — AB (ref 11.5–15.5)
WBC: 11 10*3/uL — ABNORMAL HIGH (ref 4.0–10.5)

## 2015-07-26 LAB — COMPREHENSIVE METABOLIC PANEL
ALBUMIN: 2.7 g/dL — AB (ref 3.5–5.0)
ALK PHOS: 130 U/L — AB (ref 38–126)
ALT: 18 U/L (ref 14–54)
ANION GAP: 5 (ref 5–15)
AST: 25 U/L (ref 15–41)
BILIRUBIN TOTAL: 2 mg/dL — AB (ref 0.3–1.2)
BUN: 48 mg/dL — ABNORMAL HIGH (ref 6–20)
CALCIUM: 7.7 mg/dL — AB (ref 8.9–10.3)
CO2: 11 mmol/L — ABNORMAL LOW (ref 22–32)
CREATININE: 2.44 mg/dL — AB (ref 0.44–1.00)
Chloride: 110 mmol/L (ref 101–111)
GFR calc non Af Amer: 21 mL/min — ABNORMAL LOW (ref >60)
GFR, EST AFRICAN AMERICAN: 24 mL/min — AB (ref >60)
GLUCOSE: 179 mg/dL — AB (ref 65–99)
Potassium: 2.2 mmol/L — CL (ref 3.5–5.1)
SODIUM: 126 mmol/L — AB (ref 135–145)
TOTAL PROTEIN: 5.2 g/dL — AB (ref 6.5–8.1)

## 2015-07-26 LAB — ETHANOL

## 2015-07-26 LAB — URINE MICROSCOPIC-ADD ON

## 2015-07-26 LAB — AMMONIA: Ammonia: 80 umol/L — ABNORMAL HIGH (ref 9–35)

## 2015-07-26 LAB — LACTIC ACID, PLASMA: LACTIC ACID, VENOUS: 1.7 mmol/L (ref 0.5–2.0)

## 2015-07-26 MED ORDER — POTASSIUM CHLORIDE IN NACL 40-0.9 MEQ/L-% IV SOLN
INTRAVENOUS | Status: AC
  Administered 2015-07-27: 75 mL/h via INTRAVENOUS

## 2015-07-26 MED ORDER — SODIUM CHLORIDE 0.9% FLUSH
3.0000 mL | Freq: Two times a day (BID) | INTRAVENOUS | Status: DC
  Administered 2015-07-27: 3 mL via INTRAVENOUS

## 2015-07-26 MED ORDER — POTASSIUM CHLORIDE 20 MEQ/15ML (10%) PO SOLN
40.0000 meq | Freq: Every day | ORAL | Status: DC
  Administered 2015-07-27 – 2015-07-29 (×3): 40 meq via ORAL
  Filled 2015-07-26 (×4): qty 30

## 2015-07-26 MED ORDER — PANTOPRAZOLE SODIUM 40 MG PO TBEC
40.0000 mg | DELAYED_RELEASE_TABLET | Freq: Every day | ORAL | Status: DC
  Administered 2015-07-27 – 2015-07-29 (×3): 40 mg via ORAL
  Filled 2015-07-26 (×3): qty 1

## 2015-07-26 MED ORDER — SPIRONOLACTONE 25 MG PO TABS
50.0000 mg | ORAL_TABLET | Freq: Every day | ORAL | Status: DC
  Administered 2015-07-27 – 2015-07-29 (×2): 50 mg via ORAL
  Filled 2015-07-26 (×2): qty 2

## 2015-07-26 MED ORDER — ALPRAZOLAM 0.5 MG PO TABS
0.5000 mg | ORAL_TABLET | Freq: Every evening | ORAL | Status: DC | PRN
  Administered 2015-07-27 – 2015-07-28 (×3): 0.5 mg via ORAL
  Filled 2015-07-26 (×3): qty 1

## 2015-07-26 MED ORDER — SODIUM CHLORIDE 0.9 % IV SOLN
INTRAVENOUS | Status: AC
  Administered 2015-07-26: via INTRAVENOUS

## 2015-07-26 MED ORDER — LACTULOSE 10 GM/15ML PO SOLN
30.0000 g | Freq: Three times a day (TID) | ORAL | Status: DC
  Administered 2015-07-27 – 2015-07-29 (×8): 30 g via ORAL
  Filled 2015-07-26 (×8): qty 60

## 2015-07-26 MED ORDER — PRO-STAT SUGAR FREE PO LIQD
30.0000 mL | Freq: Three times a day (TID) | ORAL | Status: DC
  Administered 2015-07-27 – 2015-07-29 (×8): 30 mL via ORAL
  Filled 2015-07-26 (×8): qty 30

## 2015-07-26 MED ORDER — SACCHAROMYCES BOULARDII 250 MG PO CAPS
250.0000 mg | ORAL_CAPSULE | Freq: Every day | ORAL | Status: DC
  Administered 2015-07-27 – 2015-07-29 (×3): 250 mg via ORAL
  Filled 2015-07-26 (×3): qty 1

## 2015-07-26 MED ORDER — MIDODRINE HCL 5 MG PO TABS
5.0000 mg | ORAL_TABLET | Freq: Three times a day (TID) | ORAL | Status: DC
  Administered 2015-07-27 – 2015-07-29 (×8): 5 mg via ORAL
  Filled 2015-07-26 (×8): qty 1

## 2015-07-26 MED ORDER — RIFAXIMIN 550 MG PO TABS
550.0000 mg | ORAL_TABLET | Freq: Two times a day (BID) | ORAL | Status: DC
  Administered 2015-07-27 – 2015-07-29 (×6): 550 mg via ORAL
  Filled 2015-07-26 (×6): qty 1

## 2015-07-26 MED ORDER — ENSURE ENLIVE PO LIQD
237.0000 mL | ORAL | Status: DC
  Administered 2015-07-27 – 2015-07-28 (×2): 237 mL via ORAL

## 2015-07-26 MED ORDER — POTASSIUM CHLORIDE 10 MEQ/100ML IV SOLN
10.0000 meq | INTRAVENOUS | Status: AC
  Administered 2015-07-26 – 2015-07-27 (×4): 10 meq via INTRAVENOUS
  Filled 2015-07-26 (×4): qty 100

## 2015-07-26 MED ORDER — GABAPENTIN 100 MG PO CAPS
100.0000 mg | ORAL_CAPSULE | Freq: Three times a day (TID) | ORAL | Status: DC
  Administered 2015-07-27 – 2015-07-29 (×8): 100 mg via ORAL
  Filled 2015-07-26 (×8): qty 1

## 2015-07-26 NOTE — ED Notes (Signed)
Pt's husband states pt has liver failure and is supposed to take Lactulose, but has been refusing.  Pt has been confused and weak.  Pt will not talk at triage.

## 2015-07-26 NOTE — ED Provider Notes (Addendum)
CSN: [id number]     Arrival date & time 07/26/15  1858 History  By signing my name below, I, Doreatha Martin, attest that this documentation has been prepared under the direction and in the presence of Mancel Bale, MD. Electronically Signed: Doreatha Martin, ED Scribe. 07/26/2015. 8:19 PM.     Chief Complaint  Patient presents with  . Altered Mental Status   The history is provided by the patient and a relative. No language interpreter was used.   HPI Comments: Kathryn [name] is a 60 y.o. female with h/o HTN, Crohn's disease, Cirrhosis on Lactulose who presents to the Emergency Department d/t AMS onset this morning at approximately 3 AM. Husband notes that the pt woke up acting confused and was not as talkative as she normally is. Per family, the pt is not taking her Lactulose as prescribed. Per family, the pt is prescribed tid Lactulose; however, they have been unable to get her to take more than one pill per day for approximately the last month. Family also reports the pt has had decreased appetite for the last month and will only eat one meal per day. Per family, the pt has been ambulatory with her symptoms. Family reports a recent PCP visit last month, at which time she was acting and eating normally. They report she has previously had similar symptoms with prior ammonia elevations secondary to Cirrhosis, but not as severe. They deny emesis, cough, recent complaints from the patient.   Past Medical History  Diagnosis Date  . Hypertension   . Enteritis presumed infectious   . Crohn's   . Anemia   . Hepatitis C antibody test positive   . Hepatitis   . Renal insufficiency   . Cirrhosis (HCC)   . Splenomegaly   . Bilateral leg edema 01/2014  . Chronic back pain   . Thrombocytopenia (HCC)   . Anemia of chronic renal failure, stage 3 (moderate) 11/25/2013   Past Surgical History  Procedure Laterality Date  . Colon surgery      MULTIPLE SURGERIES FOR CROHNS  . Ileostomy      multiple  abdominal surgeries for Crohn's by Dr. Gabriel Cirri  . Cholecystectomy    . Abscess drainage      abdominal  . Esophagogastroduodenoscopy  10/05/2010    Procedure: ESOPHAGOGASTRODUODENOSCOPY (EGD);  Surgeon: [name], MD;  [facility];  Service: Endoscopy;  Laterality: N/A;  7:30 am  . Cirrhosis    . Esophagogastroduodenoscopy N/A 09/11/2013    Procedure: ESOPHAGOGASTRODUODENOSCOPY (EGD);  Surgeon: [name], MD;  [facility]: [facility];  Service: Endoscopy;  Laterality: N/A;  . Ileoscopy  09/11/2013    Procedure: ILEOSCOPY THROUGH STOMA;  Surgeon: [name], MD;  [facility];  Service: Endoscopy;;  . Givens capsule study N/A 09/12/2013    Procedure: GIVENS CAPSULE STUDY;  Surgeon: [name], MD;  [facility];  Service: Endoscopy;  Laterality: N/A;  . Givens capsule study N/A 11/25/2013    Procedure: GIVENS CAPSULE STUDY;  Surgeon: [name], MD;  [facility];  Service: Endoscopy;  Laterality: N/A;  . Varices cauterization Right     in the stoma of her ileostomy  . Tips procedure  11/2013    [facility]   . Abdominal surgery     Family History  Problem Relation Age of Onset  . Cancer Mother   . Stroke Father    Social History  Substance Use Topics  . Smoking  status: Never Smoker   . Smokeless tobacco: Never Used  . Alcohol Use: No   OB History    Gravida Para Term Preterm AB TAB SAB Ectopic Multiple Living            1     Review of Systems  All other systems reviewed and are negative.   Allergies  Penicillins  Home Medications   Prior to Admission medications   Medication Sig Start Date End Date Taking? Authorizing Provider  ALPRAZolam Prudy Feeler) 0.5 MG tablet Take 1 tablet (0.5 mg total) by mouth at bedtime as needed for anxiety. 07/02/15  Yes Rolly Salter, MD  feeding supplement, ENSURE ENLIVE, (ENSURE ENLIVE) LIQD Take 237 mLs by mouth daily. 03/27/15  Yes Standley Brooking, MD  furosemide (LASIX) 40 MG  tablet Take 1 tablet (40 mg total) by mouth daily. 07/23/15  Yes Elige Radon Dettinger, MD  gabapentin (NEURONTIN) 100 MG capsule TAKE 1 CAPSULE THREE TIMES DAILY 07/24/15  Yes Elige Radon Dettinger, MD  lactulose (CHRONULAC) 10 GM/15ML solution Take 45 mLs (30 g total) by mouth 3 (three) times daily. 07/02/15  Yes Rolly Salter, MD  midodrine (PROAMATINE) 5 MG tablet TAKE 1 TABLET BY MOUTH THREE TIMES DAILY WITH MEALS 07/24/15  Yes Elige Radon Dettinger, MD  pantoprazole (PROTONIX) 40 MG tablet TAKE 1 TABLET BY MOUTH DAILY 06/09/14  Yes Len Blalock, NP  potassium chloride (KLOR-CON) 20 MEQ packet Take 40 mEq by mouth daily. 07/24/15  Yes Elige Radon Dettinger, MD  Probiotic Product (PROBIOTIC PO) Take 1 tablet by mouth daily.   Yes Historical Provider, MD  rifaximin (XIFAXAN) 550 MG TABS tablet Take 1 tablet (550 mg total) by mouth 2 (two) times daily. 07/02/15  Yes Rolly Salter, MD  spironolactone (ALDACTONE) 25 MG tablet TAKE 2 TABLETS BY MOUTH EVERY DAY 07/24/15  Yes Elige Radon Dettinger, MD  Amino Acids-Protein Hydrolys (FEEDING SUPPLEMENT, PRO-STAT SUGAR FREE 64,) LIQD Take 30 mLs by mouth 3 (three) times daily with meals. 07/02/15   Rolly Salter, MD  ciprofloxacin (CIPRO) 250 MG tablet Take 1 tablet (250 mg total) by mouth 2 (two) times daily. Patient not taking: Reported on 07/26/2015 07/07/15   Elige Radon Dettinger, MD  mesalamine (CANASA) 1000 MG suppository Place 1 suppository (1,000 mg total) rectally at bedtime. Patient not taking: Reported on 07/26/2015 04/22/15   Erick Blinks, MD   BP 108/70 mmHg  Pulse 122  Temp(Src) 98.2 F (36.8 C) (Oral)  Resp 20  Ht  (1.575 m)  Wt 203 lb 0.7 oz (92.1 kg)  BMI 37.13 kg/m2  SpO2 100% Physical Exam  Constitutional: She appears well-developed and well-nourished.  HENT:  Head: Normocephalic and atraumatic.  Eyes: Conjunctivae and EOM are normal. Pupils are equal, round, and reactive to light. Scleral icterus is present.  Mild scleral icterus.   Neck:  Normal range of motion and phonation normal. Neck supple.  Cardiovascular: Normal rate and regular rhythm.   Pulmonary/Chest: Effort normal and breath sounds normal. She exhibits no tenderness.  Abdominal: Soft. She exhibits no distension. There is no tenderness. There is no guarding.  Ostomy to RLQ draining thin browish fluid. Ostomy site appears normal.   Musculoskeletal: Normal range of motion. She exhibits edema.  3+ edema of BLE.   Neurological: She is alert. She exhibits normal muscle tone.  Skin: Skin is warm and dry.  Generalized flat ecchymotic areas.   Psychiatric: She has a normal mood and affect. Her behavior is  normal. Judgment and thought content normal.  Nursing note and vitals reviewed.   ED Course  Procedures (including critical care time) DIAGNOSTIC STUDIES: Oxygen Saturation is 100% on RA, normal by my interpretation.    COORDINATION OF CARE: 8:14 PM Discussed treatment plan with pt at bedside which includes lab work and pt agreed to plan.   Medications  potassium chloride 10 mEq in 100 mL IVPB (10 mEq Intravenous New Bag/Given 07/26/15 2239)     Patient Vitals for the past 24 hrs:  BP Temp Temp src Pulse Resp SpO2 Height Weight  07/26/15 2257 - 98.2 F (36.8 C) Oral - - - 5\' 2"  (1.575 m) 203 lb 0.7 oz (92.1 kg)  07/26/15 2230 108/70 mmHg - - (!) 122 20 100 % - -  07/26/15 2041 114/71 mmHg - - 89 17 100 % - -  07/26/15 1902 130/63 mmHg 98.3 F (36.8 C) Oral 88 20 100 % 5\' 6"  (1.676 m) 190 lb (86.183 kg)    Labs Review Labs Reviewed  AMMONIA - Abnormal; Notable for the following:    Ammonia 80 (*)    All other components within normal limits  COMPREHENSIVE METABOLIC PANEL - Abnormal; Notable for the following:    Sodium 126 (*)    Potassium 2.2 (*)    CO2 11 (*)    Glucose, Bld 179 (*)    BUN 48 (*)    Creatinine, Ser 2.44 (*)    Calcium 7.7 (*)    Total Protein 5.2 (*)    Albumin 2.7 (*)    Alkaline Phosphatase 130 (*)    Total Bilirubin 2.0 (*)     GFR calc non Af Amer 21 (*)    GFR calc Af Amer 24 (*)    All other components within normal limits  CBC WITH DIFFERENTIAL/PLATELET - Abnormal; Notable for the following:    WBC 11.0 (*)    RBC 2.96 (*)    Hemoglobin 9.1 (*)    HCT 25.6 (*)    RDW 16.8 (*)    Platelets 85 (*)    Neutro Abs 9.3 (*)    All other components within normal limits  URINALYSIS, ROUTINE W REFLEX MICROSCOPIC (NOT AT [facility]) - Abnormal; Notable for the following:    APPearance HAZY (*)    Hgb urine dipstick SMALL (*)    Leukocytes, UA MODERATE (*)    All other components within normal limits  URINE MICROSCOPIC-ADD ON - Abnormal; Notable for the following:    Squamous Epithelial / LPF 6-30 (*)    Bacteria, UA FEW (*)    All other components within normal limits  URINE CULTURE  ETHANOL  LACTIC ACID, PLASMA    Imaging Review No results found. I have personally reviewed and evaluated these images and lab results as part of my medical decision-making.   EKG Interpretation None        Date: 07/26/15  Rate: 93  Rhythm: normal sinus rhythm  QRS Axis: normal  PR and QT Intervals: normal  ST/T Wave abnormalities: normal  PR and QRS Conduction Disutrbances:none  Narrative Interpretation:   Old EKG Reviewed: unchanged   MDM   Final diagnoses:  Hepatic encephalopathy (HCC)  AKI (acute kidney injury) (HCC)  Hypokalemia  Hyponatremia    Hepatic encephalopathy, with hypokalemia, and hyponatremia related to nutritional deficiency, and noncompliance with medical treatment. She will require admission for stabilization, and close assessment.  Nursing Notes Reviewed/ Care Coordinated, and agree without changes. Applicable Imaging Reviewed.  Interpretation  of Laboratory Data incorporated into ED treatment  Plan: Admit  I personally performed the services described in this documentation, which was scribed in my presence. The recorded information has been reviewed and is accurate.    Mancel Bale,  MD 07/26/15 1610  Mancel Bale, MD 07/27/15 [id number]

## 2015-07-26 NOTE — H&P (Signed)
[facility] H&P   Patient Demographics:    Kathryn [name], is a 60 y.o. female  MRN: [id number]   DOB - 06-01-1955  Admit Date - 07/26/2015  Outpatient Primary MD for the patient is Nils Pyle, MD  Referring MD/NP/PA:  Gray Bernhardt  Outpatient Specialists: Dr. [name] (GI)  Patient coming from: home  Chief Complaint  Patient presents with  . Altered Mental Status      HPI:    Kathryn [name]  is a 60 y.o. female, with CIrrhosis, Hepatic encephalopathy Crohns disease, apparently c/o generalized weakness over the past several days as well as twitching of her legs last nite.  Pt has had poor po intake and has not been taking her potassium according to her family.  Pt was brought to ED for evaluation.    In ED,  Pt found to be hypokalemic (K= 2.2) and to have hyponatremia,(Na=126) and mild ARF (Bun/creat 48/2.44), pt will be admitted for severe hypokalemia and ARF on CRF.       Review of systems:    In addition to the HPI above,  No Fever-chills, No Headache, No changes with Vision or hearing, No problems swallowing food or Liquids, No Chest pain, Cough or Shortness of Breath, No Abdominal pain, No Nausea or Vommitting, Bowel movements are regular, No Blood in stool or Urine, No dysuria, No new skin rashes or bruises, No new joints pains-aches,  No new  tingling, numbness in any extremity, No recent weight gain or loss, No polyuria, polydypsia or polyphagia, No significant Mental Stressors.  A full 10 point Review of Systems was done, except as stated above, all other Review of Systems were negative.   With Past History of the following :    Past Medical History  Diagnosis Date  . Hypertension   . Enteritis presumed infectious   . Crohn's   . Anemia   . Hepatitis C antibody test positive   . Hepatitis   . Renal insufficiency   . Cirrhosis (HCC)   . Splenomegaly    . Bilateral leg edema 01/2014  . Chronic back pain   . Thrombocytopenia (HCC)   . Anemia of chronic renal failure, stage 3 (moderate) 11/25/2013      Past Surgical History  Procedure Laterality Date  . Colon surgery      MULTIPLE SURGERIES FOR CROHNS  . Ileostomy      multiple abdominal surgeries for Crohn's by Dr. Gabriel Cirri  . Cholecystectomy    . Abscess drainage      abdominal  . Esophagogastroduodenoscopy  10/05/2010    Procedure: ESOPHAGOGASTRODUODENOSCOPY (EGD);  Surgeon: [name], MD;  [facility];  Service: Endoscopy;  Laterality: N/A;  7:30 am  . Cirrhosis    . Esophagogastroduodenoscopy N/A 09/11/2013    Procedure: ESOPHAGOGASTRODUODENOSCOPY (EGD);  Surgeon: [name], MD;  [facility];  Service: Endoscopy;  Laterality: N/A;  . Ileoscopy  09/11/2013    Procedure: ILEOSCOPY THROUGH STOMA;  Surgeon: [name], MD;  [facility];  Service: Endoscopy;;  . Givens capsule study N/A 09/12/2013    Procedure: GIVENS CAPSULE STUDY;  Surgeon: [name], MD;  [facility];  Service: Endoscopy;  Laterality: N/A;  . Givens capsule study N/A 11/25/2013    Procedure: GIVENS CAPSULE STUDY;  Surgeon: [name], MD;  [facility];  Service: Endoscopy;  Laterality: N/A;  . Varices cauterization Right     in the stoma of her ileostomy  . Tips procedure  11/2013    [facility]   . Abdominal surgery        Social History:     Social History  Substance Use Topics  . Smoking status: Never Smoker   . Smokeless tobacco: Never Used  . Alcohol Use: No     Lives - at home  Mobility - minimal   Family History :     Family History  Problem Relation Age of Onset  . Cancer Mother   . Stroke Father       Home Medications:   Prior to Admission medications   Medication Sig Start Date End Date Taking? Authorizing Provider  ALPRAZolam Prudy Feeler) 0.5 MG tablet Take 1 tablet (0.5 mg total) by mouth at bedtime as  needed for anxiety. 07/02/15  Yes Rolly Salter, MD  feeding supplement, ENSURE ENLIVE, (ENSURE ENLIVE) LIQD Take 237 mLs by mouth daily. 03/27/15  Yes Standley Brooking, MD  furosemide (LASIX) 40 MG tablet Take 1 tablet (40 mg total) by mouth daily. 07/23/15  Yes Elige Radon Dettinger, MD  gabapentin (NEURONTIN) 100 MG capsule TAKE 1 CAPSULE THREE TIMES DAILY 07/24/15  Yes Elige Radon Dettinger, MD  lactulose (CHRONULAC) 10 GM/15ML solution Take 45 mLs (30 g total) by mouth 3 (three) times daily. 07/02/15  Yes Rolly Salter, MD  midodrine (PROAMATINE) 5 MG tablet TAKE 1 TABLET BY MOUTH THREE TIMES DAILY WITH MEALS 07/24/15  Yes Elige Radon Dettinger, MD  pantoprazole (PROTONIX) 40 MG tablet TAKE 1 TABLET BY MOUTH DAILY 06/09/14  Yes Len Blalock, NP  potassium chloride (KLOR-CON) 20 MEQ packet Take 40 mEq by mouth daily. 07/24/15  Yes Elige Radon Dettinger, MD  Probiotic Product (PROBIOTIC PO) Take 1 tablet by mouth daily.   Yes Historical Provider, MD  rifaximin (XIFAXAN) 550 MG TABS tablet Take 1 tablet (550 mg total) by mouth 2 (two) times daily. 07/02/15  Yes Rolly Salter, MD  spironolactone (ALDACTONE) 25 MG tablet TAKE 2 TABLETS BY MOUTH EVERY DAY 07/24/15  Yes Elige Radon Dettinger, MD  Amino Acids-Protein Hydrolys (FEEDING SUPPLEMENT, PRO-STAT SUGAR FREE 64,) LIQD Take 30 mLs by mouth 3 (three) times daily with meals. 07/02/15   Rolly Salter, MD  ciprofloxacin (CIPRO) 250 MG tablet Take 1 tablet (250 mg total) by mouth 2 (two) times daily. Patient not taking: Reported on 07/26/2015 07/07/15   Elige Radon Dettinger, MD  mesalamine (CANASA) 1000 MG suppository Place 1 suppository (1,000 mg total) rectally at bedtime. Patient not taking: Reported on 07/26/2015 04/22/15   Erick Blinks, MD     Allergies:     Allergies  Allergen Reactions  . Penicillins Rash    Has patient had a PCN reaction causing immediate rash, facial/tongue/throat swelling, SOB or lightheadedness with hypotension: Yes Has patient had a PCN  reaction causing severe rash involving mucus membranes or skin necrosis:  No Has patient had a PCN reaction that required hospitalization No Has patient had a PCN reaction occurring within the last 10 years: No If all of the above answers are "NO", then may proceed with Cephalosporin use.      Physical Exam:   Vitals  Blood pressure 108/70, pulse 122, temperature 98.3 F (36.8 C), temperature source Oral, resp. rate 20, height  (1.676 m), weight 86.183 kg (190 lb), SpO2 100 %.   1. General lying in bed in NAD,    2. Normal affect and insight, Not Suicidal or Homicidal, Awake Alert, Oriented X 3.  3. No F.N deficits, ALL C.Nerves Intact, Strength 5/5 all 4 extremities, Sensation intact all 4 extremities, Plantars down going.  4. Ears and Eyes appear Normal, Conjunctivae clear, PERRLA. Moist Oral Mucosa.  5. Supple Neck, No JVD, No cervical lymphadenopathy appriciated, No Carotid Bruits.  6. Symmetrical Chest wall movement, Good air movement bilaterally, CTAB.  7. RRR, No Gallops, Rubs or Murmurs, No Parasternal Heave.  8. Positive Bowel Sounds, Abdomen Soft, No tenderness, No organomegaly appriciated,No rebound -guarding or rigidity.  9.  No Cyanosis, Normal Skin Turgor, no palmar erythema, slight spider on chest,  No asterixis.  1+ edema  10. Good muscle tone,  joints appear normal , no effusions, Normal ROM.  11. No Palpable Lymph Nodes in Neck or Axillae     Data Review:    CBC  Recent Labs Lab 07/26/15 2027  WBC 11.0*  HGB 9.1*  HCT 25.6*  PLT 85*  MCV 86.5  MCH 30.7  MCHC 35.5  RDW 16.8*  LYMPHSABS 0.9  MONOABS 0.7  EOSABS 0.1  BASOSABS 0.0   ------------------------------------------------------------------------------------------------------------------  Chemistries   Recent Labs Lab 07/26/15 2027  NA 126*  K 2.2*  CL 110  CO2 11*  GLUCOSE 179*  BUN 48*  CREATININE 2.44*  CALCIUM 7.7*  AST 25  ALT 18  ALKPHOS 130*  BILITOT 2.0*     ------------------------------------------------------------------------------------------------------------------ estimated creatinine clearance is 27.5 mL/min (by C-G formula based on Cr of 2.44). ------------------------------------------------------------------------------------------------------------------ No results for input(s): TSH, T4TOTAL, T3FREE, THYROIDAB in the last 72 hours.  Invalid input(s): FREET3  Coagulation profile No results for input(s): INR, PROTIME in the last 168 hours. ------------------------------------------------------------------------------------------------------------------- No results for input(s): DDIMER in the last 72 hours. -------------------------------------------------------------------------------------------------------------------  Cardiac Enzymes No results for input(s): CKMB, TROPONINI, MYOGLOBIN in the last 168 hours.  Invalid input(s): CK ------------------------------------------------------------------------------------------------------------------    Component Value Date/Time   BNP 44.0 07/14/2015 0059     ---------------------------------------------------------------------------------------------------------------  Urinalysis    Component Value Date/Time   COLORURINE YELLOW 07/26/2015 2000   APPEARANCEUR HAZY* 07/26/2015 2000   APPEARANCEUR Hazy* 07/07/2015 1034   LABSPEC 1.010 07/26/2015 2000   PHURINE 6.0 07/26/2015 2000   GLUCOSEU NEGATIVE 07/26/2015 2000   HGBUR SMALL* 07/26/2015 2000   BILIRUBINUR NEGATIVE 07/26/2015 2000   BILIRUBINUR Negative 07/07/2015 1034   BILIRUBINUR negative 02/23/2015 1751   KETONESUR NEGATIVE 07/26/2015 2000   PROTEINUR NEGATIVE 07/26/2015 2000   PROTEINUR 1+* 07/07/2015 1034   PROTEINUR 2+ 02/23/2015 1751   UROBILINOGEN negative 02/23/2015 1751   UROBILINOGEN 0.2 06/09/2014 0545   NITRITE NEGATIVE 07/26/2015 2000   NITRITE Negative 07/07/2015 1034   NITRITE negative 02/23/2015  1751   LEUKOCYTESUR MODERATE* 07/26/2015 2000   LEUKOCYTESUR 3+* 07/07/2015 1034    ----------------------------------------------------------------------------------------------------------------   Imaging Results:    No results found.   Assessment & Plan:    Active Problems:   Hepatic cirrhosis (HCC)  Thrombocytopenia (HCC)   Anemia of chronic renal failure, stage 3 (moderate)   Hyponatremia   Hypokalemia   Hepatic encephalopathy (HCC)   Acute renal failure (ARF) (HCC)    1. ARF Secondary to dehydration from poor po intake Hold lasis, will hydrate gently.   2.  Severe hypokalemia Replete Check cmp in am Check magnesium in am  3. Hepatic encephalopathy Cont lactulose, cont rifaximin  4. Hyponatremia Secondary to cirrhosis Hydrate with ns iv  5. Anemia/ thrombocytopenia Check cbc in am.   6. Tachycardia ? Secondary to dehydration Will hydrate gently.  If persistent, consider cardiac echo, tsh  7.  Hyperglycemia Check hga1c  8.  Protein calorie malnutrition prostat     DVT Prophylaxis SCDs   AM Labs Ordered, also please review Full Orders  Family Communication: Admission, patients condition and plan of care including tests being ordered have been discussed with the patient who indicate understanding and agree with the plan and Code Status.  Code Status FULL CODE  Likely DC to   Condition GUARDED    Consults called:    Admission status: inpatient  Time spent in minutes : 45 minutes   Pearson Grippe M.D on 07/26/2015 at 10:57 PM  Between 7am to 7pm - Pager - [phone]. After 7pm go to [email or web address] - password [facility]  [facility] - Office  [phone]

## 2015-07-27 DIAGNOSIS — N179 Acute kidney failure, unspecified: Principal | ICD-10-CM

## 2015-07-27 LAB — CBC
HEMATOCRIT: 24.3 % — AB (ref 36.0–46.0)
Hemoglobin: 8.8 g/dL — ABNORMAL LOW (ref 12.0–15.0)
MCH: 31.3 pg (ref 26.0–34.0)
MCHC: 36.2 g/dL — ABNORMAL HIGH (ref 30.0–36.0)
MCV: 86.5 fL (ref 78.0–100.0)
Platelets: 92 10*3/uL — ABNORMAL LOW (ref 150–400)
RBC: 2.81 MIL/uL — AB (ref 3.87–5.11)
RDW: 16.8 % — AB (ref 11.5–15.5)
WBC: 11 10*3/uL — AB (ref 4.0–10.5)

## 2015-07-27 LAB — COMPREHENSIVE METABOLIC PANEL
ALT: 17 U/L (ref 14–54)
AST: 23 U/L (ref 15–41)
Albumin: 2.7 g/dL — ABNORMAL LOW (ref 3.5–5.0)
Alkaline Phosphatase: 120 U/L (ref 38–126)
Anion gap: 7 (ref 5–15)
BUN: 47 mg/dL — AB (ref 6–20)
CHLORIDE: 113 mmol/L — AB (ref 101–111)
CO2: 10 mmol/L — AB (ref 22–32)
Calcium: 7.8 mg/dL — ABNORMAL LOW (ref 8.9–10.3)
Creatinine, Ser: 2.29 mg/dL — ABNORMAL HIGH (ref 0.44–1.00)
GFR calc Af Amer: 26 mL/min — ABNORMAL LOW (ref >60)
GFR, EST NON AFRICAN AMERICAN: 22 mL/min — AB (ref >60)
Glucose, Bld: 126 mg/dL — ABNORMAL HIGH (ref 65–99)
POTASSIUM: 2.6 mmol/L — AB (ref 3.5–5.1)
SODIUM: 130 mmol/L — AB (ref 135–145)
Total Bilirubin: 2.1 mg/dL — ABNORMAL HIGH (ref 0.3–1.2)
Total Protein: 4.9 g/dL — ABNORMAL LOW (ref 6.5–8.1)

## 2015-07-27 LAB — MRSA PCR SCREENING: MRSA by PCR: POSITIVE — AB

## 2015-07-27 LAB — MAGNESIUM: MAGNESIUM: 1.3 mg/dL — AB (ref 1.7–2.4)

## 2015-07-27 MED ORDER — MAGNESIUM SULFATE 2 GM/50ML IV SOLN
2.0000 g | Freq: Once | INTRAVENOUS | Status: AC
  Administered 2015-07-27: 2 g via INTRAVENOUS
  Filled 2015-07-27: qty 50

## 2015-07-27 MED ORDER — CHLORHEXIDINE GLUCONATE CLOTH 2 % EX PADS
6.0000 | MEDICATED_PAD | Freq: Every day | CUTANEOUS | Status: DC
  Administered 2015-07-27 – 2015-07-29 (×3): 6 via TOPICAL

## 2015-07-27 MED ORDER — ALPRAZOLAM 0.5 MG PO TABS
0.5000 mg | ORAL_TABLET | Freq: Once | ORAL | Status: AC
  Administered 2015-07-27: 0.5 mg via ORAL
  Filled 2015-07-27: qty 1

## 2015-07-27 MED ORDER — SODIUM CHLORIDE 0.9 % IV SOLN
INTRAVENOUS | Status: AC
  Administered 2015-07-27: 18:00:00 via INTRAVENOUS

## 2015-07-27 MED ORDER — MUPIROCIN 2 % EX OINT
1.0000 "application " | TOPICAL_OINTMENT | Freq: Two times a day (BID) | CUTANEOUS | Status: DC
  Administered 2015-07-27 – 2015-07-29 (×5): 1 via NASAL
  Filled 2015-07-27 (×3): qty 22

## 2015-07-27 MED ORDER — MAGNESIUM SULFATE 2 GM/50ML IV SOLN
1.0000 g | Freq: Once | INTRAVENOUS | Status: AC
  Administered 2015-07-27: 1 g via INTRAVENOUS
  Filled 2015-07-27: qty 50

## 2015-07-27 MED ORDER — POTASSIUM CHLORIDE 10 MEQ/100ML IV SOLN
10.0000 meq | INTRAVENOUS | Status: AC
  Administered 2015-07-27 (×4): 10 meq via INTRAVENOUS
  Filled 2015-07-27 (×4): qty 100

## 2015-07-27 NOTE — Progress Notes (Signed)
PROGRESS NOTE    Kathryn [name]  ZOX:[id number] DOB: 1955-06-10 DOA: 07/26/2015 PCP: Elige Radon Dettinger, MD     Brief Narrative:  60 year old woman admitted on 6/18 from home with complaints of confusion. She has a history of cirrhosis and hepatic encephalopathy who has been having generalized weakness and poor by mouth intake. On arrival she was found to be hypokalemic, hyponatremic and with acute renal failure and was admitted for further evaluation and management.   Assessment & Plan:   Active Problems:   Hepatic cirrhosis (HCC)   Thrombocytopenia (HCC)   Anemia of chronic renal failure, stage 3 (moderate)   Hyponatremia   Hypokalemia   Hepatic encephalopathy (HCC)   Acute renal failure (ARF) (HCC)   Acute encephalopathy -Mostly resolved. -Etiology not 100% percent clear, but suspect given rapid resolution was most likely related to dehydration and hyponatremia more so than hepatic encephalopathy. -Continue lactulose given elevated ammonia and history of hepatic encephalopathy in the past.  Hyponatremia -Sodium has improved to 130 with IV fluids. -Will give another 12 hours of IV fluids.  Acute on chronic kidney disease stage III -Baseline creatinine appears to be between 1.5 and 1.7. -Creatinine 2.4 at admission down to 2.2 today, continue IV fluids.  Hypokalemia/hypomagnesemia -Replete.   DVT prophylaxis: SCDs Code Status: DO NOT RESUSCITATE Family Communication: Patient only Disposition Plan: Transfer to floor  Consultants:   None  Procedures:   None  Antimicrobials:   None    Subjective: Sitting up in bed, eating lunch, states she feels improved.  Objective: Filed Vitals:   07/27/15 0500 07/27/15 0600 07/27/15 0800 07/27/15 1200  BP: 121/49 115/60 102/54 121/59  Pulse: 91 87 84 103  Temp:   98 F (36.7 C)   TempSrc:   Oral   Resp: Height:      Weight:      SpO2: 100% 100% 100% 100%    Intake/Output Summary (Last 24 hours)  at 07/27/15 1606 Last data filed at 07/27/15 1531  Gross per 24 hour  Intake 758.75 ml  Output   1125 ml  Net -366.25 ml   Filed Weights   07/26/15 1902 07/26/15 2257 07/26/15 2336  Weight: 86.183 kg (190 lb) 92.1 kg (203 lb 0.7 oz) 84.5 kg (186 lb 4.6 oz)    Examination:  General exam: Alert, awake, oriented x 3 Respiratory system: Clear to auscultation. Respiratory effort normal. Cardiovascular system:RRR. No murmurs, rubs, gallops. Gastrointestinal system: Abdomen is nondistended, soft and nontender. No organomegaly or masses felt. Normal bowel sounds heard. Central nervous system: Alert and oriented. No focal neurological deficits. Extremities: No C/C/E, +pedal pulses Skin: No rashes, lesions or ulcers    Data Reviewed: I have personally reviewed following labs and imaging studies  CBC:  Recent Labs Lab 07/26/15 2027 07/27/15 0448  WBC 11.0* 11.0*  NEUTROABS 9.3*  --   HGB 9.1* 8.8*  HCT 25.6* 24.3*  MCV 86.5 86.5  PLT 85* 92*   Basic Metabolic Panel:  Recent Labs Lab 07/26/15 2027 07/27/15 0448  NA 126* 130*  K 2.2* 2.6*  CL 110 113*  CO2 11* 10*  GLUCOSE 179* 126*  BUN 48* 47*  CREATININE 2.44* 2.29*  CALCIUM 7.7* 7.8*  MG  --  1.3*   GFR: Estimated Creatinine Clearance: 28.4 mL/min (by C-G formula based on Cr of 2.29). Liver Function Tests:  Recent Labs Lab 07/26/15 2027 07/27/15 0448  AST 25 23  ALT 18 17  ALKPHOS 130* 120  BILITOT 2.0* 2.1*  PROT 5.2* 4.9*  ALBUMIN 2.7* 2.7*   No results for input(s): LIPASE, AMYLASE in the last 168 hours.  Recent Labs Lab 07/26/15 2027  AMMONIA 80*   Coagulation Profile: No results for input(s): INR, PROTIME in the last 168 hours. Cardiac Enzymes: No results for input(s): CKTOTAL, CKMB, CKMBINDEX, TROPONINI in the last 168 hours. BNP (last 3 results) No results for input(s): PROBNP in the last 8760 hours. HbA1C: No results for input(s): HGBA1C in the last 72 hours. CBG: No results for  input(s): GLUCAP in the last 168 hours. Lipid Profile: No results for input(s): CHOL, HDL, LDLCALC, TRIG, CHOLHDL, LDLDIRECT in the last 72 hours. Thyroid Function Tests: No results for input(s): TSH, T4TOTAL, FREET4, T3FREE, THYROIDAB in the last 72 hours. Anemia Panel: No results for input(s): VITAMINB12, FOLATE, FERRITIN, TIBC, IRON, RETICCTPCT in the last 72 hours. Urine analysis:    Component Value Date/Time   COLORURINE YELLOW 07/26/2015 2000   APPEARANCEUR HAZY* 07/26/2015 2000   APPEARANCEUR Hazy* 07/07/2015 1034   LABSPEC 1.010 07/26/2015 2000   PHURINE 6.0 07/26/2015 2000   GLUCOSEU NEGATIVE 07/26/2015 2000   HGBUR SMALL* 07/26/2015 2000   BILIRUBINUR NEGATIVE 07/26/2015 2000   BILIRUBINUR Negative 07/07/2015 1034   BILIRUBINUR negative 02/23/2015 1751   KETONESUR NEGATIVE 07/26/2015 2000   PROTEINUR NEGATIVE 07/26/2015 2000   PROTEINUR 1+* 07/07/2015 1034   PROTEINUR 2+ 02/23/2015 1751   UROBILINOGEN negative 02/23/2015 1751   UROBILINOGEN 0.2 06/09/2014 0545   NITRITE NEGATIVE 07/26/2015 2000   NITRITE Negative 07/07/2015 1034   NITRITE negative 02/23/2015 1751   LEUKOCYTESUR MODERATE* 07/26/2015 2000   LEUKOCYTESUR 3+* 07/07/2015 1034   Sepsis Labs: @LABRCNTIP (procalcitonin:4,lacticidven:4)  ) Recent Results (from the past 240 hour(s))  MRSA PCR Screening     Status: Abnormal   Collection Time: 07/26/15 11:56 PM  Result Value Ref Range Status   MRSA by PCR POSITIVE (A) NEGATIVE Final    Comment:        The GeneXpert MRSA Assay (FDA approved for NASAL specimens only), is one component of a comprehensive MRSA colonization surveillance program. It is not intended to diagnose MRSA infection nor to guide or monitor treatment for MRSA infections. RESULT CALLED TO, READ BACK BY AND VERIFIED WITH: SPANGLER,E. AT 1015 ON 07/27/2015 BY BAUGHAM,M.          Radiology Studies: No results found.      Scheduled Meds: . ALPRAZolam  0.5 mg Oral Once  .  Chlorhexidine Gluconate Cloth  6 each Topical Q0600  . feeding supplement (ENSURE ENLIVE)  237 mL Oral Q24H  . feeding supplement (PRO-STAT SUGAR FREE 64)  30 mL Oral TID WC  . gabapentin  100 mg Oral TID  . lactulose  30 g Oral TID  . midodrine  5 mg Oral TID WC  . mupirocin ointment  1 application Nasal BID  . pantoprazole  40 mg Oral Daily  . potassium chloride  40 mEq Oral Daily  . rifaximin  550 mg Oral BID  . saccharomyces boulardii  250 mg Oral Daily  . sodium chloride flush  3 mL Intravenous Q12H  . spironolactone  50 mg Oral Daily   Continuous Infusions:    LOS: 1 day    Time spent: 25 minutes. Greater than 50% of this time was spent in direct contact with the patient coordinating care.     Chaya Jan, MD [facility] Pager [phone]  If 7PM-7AM, please contact night-coverage [email or web address] Password [facility] 07/27/2015, 4:06  PM

## 2015-07-27 NOTE — Care Management Important Message (Signed)
Important Message  Patient Details  Name: Kathryn [name] MRN: [id number] Date of Birth: 01-15-1956   Medicare Important Message Given:  Yes    Malcolm Metro, RN 07/27/2015, 2:42 PM

## 2015-07-27 NOTE — Care Management Note (Signed)
Case Management Note  Patient Details  Name: ALFIE SVETLIK MRN: [id number] Date of Birth: 1955-12-21  Subjective/Objective: Patient is from home with husband. She is independent with ADL's. Per pt, she drives herself to appointments. She has [employer] and reports no issue with affording medications. Patient has refused home health services in the past. Most re cently with [facility]. Will has had 8 admission in 6 months. Per H&P family reports pt has no been taking K like she is supposed to. CM will need to re-address [facility] services at [place] to better determine compliance, Will discuss ? Need for palliative consult if non-compliance continues.    Action/Plan: Will cont to follow.   Expected Discharge Date:     07/31/2015             Expected Discharge Plan:  Home w Home Health Services  In-House Referral:  NA  Discharge planning Services  CM Consult  Post Acute Care Choice:  NA Choice offered to:  NA  DME Arranged:    DME Agency:     HH Arranged:    HH Agency:     Status of Service:  In process, will continue to follow  Medicare Important Message Given:    Date Medicare IM Given:    Medicare IM give by:    Date Additional Medicare IM Given:    Additional Medicare Important Message give by:     If discussed at Long Length of Stay Meetings, dates discussed:    Additional Comments:  Malcolm Metro, RN 07/27/2015, 2:39 PM

## 2015-07-28 ENCOUNTER — Telehealth: Payer: Self-pay | Admitting: Family Medicine

## 2015-07-28 ENCOUNTER — Encounter (HOSPITAL_COMMUNITY): Payer: Self-pay | Admitting: Primary Care

## 2015-07-28 DIAGNOSIS — Z515 Encounter for palliative care: Secondary | ICD-10-CM

## 2015-07-28 DIAGNOSIS — Z7189 Other specified counseling: Secondary | ICD-10-CM

## 2015-07-28 DIAGNOSIS — Z66 Do not resuscitate: Secondary | ICD-10-CM | POA: Insufficient documentation

## 2015-07-28 DIAGNOSIS — K7290 Hepatic failure, unspecified without coma: Secondary | ICD-10-CM

## 2015-07-28 LAB — BASIC METABOLIC PANEL
Anion gap: 4 — ABNORMAL LOW (ref 5–15)
BUN: 46 mg/dL — ABNORMAL HIGH (ref 6–20)
CO2: 12 mmol/L — ABNORMAL LOW (ref 22–32)
CREATININE: 1.99 mg/dL — AB (ref 0.44–1.00)
Calcium: 8.2 mg/dL — ABNORMAL LOW (ref 8.9–10.3)
Chloride: 118 mmol/L — ABNORMAL HIGH (ref 101–111)
GFR, EST AFRICAN AMERICAN: 30 mL/min — AB (ref >60)
GFR, EST NON AFRICAN AMERICAN: 26 mL/min — AB (ref >60)
Glucose, Bld: 122 mg/dL — ABNORMAL HIGH (ref 65–99)
POTASSIUM: 2.9 mmol/L — AB (ref 3.5–5.1)
SODIUM: 134 mmol/L — AB (ref 135–145)

## 2015-07-28 LAB — URINE CULTURE

## 2015-07-28 LAB — CBC
HEMATOCRIT: 24.7 % — AB (ref 36.0–46.0)
Hemoglobin: 8.8 g/dL — ABNORMAL LOW (ref 12.0–15.0)
MCH: 31.4 pg (ref 26.0–34.0)
MCHC: 35.6 g/dL (ref 30.0–36.0)
MCV: 88.2 fL (ref 78.0–100.0)
PLATELETS: 66 10*3/uL — AB (ref 150–400)
RBC: 2.8 MIL/uL — AB (ref 3.87–5.11)
RDW: 17.2 % — AB (ref 11.5–15.5)
WBC: 9.2 10*3/uL (ref 4.0–10.5)

## 2015-07-28 LAB — AMMONIA: AMMONIA: 50 umol/L — AB (ref 9–35)

## 2015-07-28 LAB — MAGNESIUM: Magnesium: 1.6 mg/dL — ABNORMAL LOW (ref 1.7–2.4)

## 2015-07-28 MED ORDER — MAGNESIUM SULFATE 2 GM/50ML IV SOLN
2.0000 g | Freq: Once | INTRAVENOUS | Status: AC
  Administered 2015-07-28: 2 g via INTRAVENOUS
  Filled 2015-07-28: qty 50

## 2015-07-28 MED ORDER — MAGNESIUM OXIDE 400 (241.3 MG) MG PO TABS
400.0000 mg | ORAL_TABLET | Freq: Two times a day (BID) | ORAL | Status: DC
  Administered 2015-07-28 – 2015-07-29 (×2): 400 mg via ORAL
  Filled 2015-07-28 (×3): qty 1

## 2015-07-28 MED ORDER — IBUPROFEN 400 MG PO TABS
400.0000 mg | ORAL_TABLET | Freq: Three times a day (TID) | ORAL | Status: DC | PRN
  Administered 2015-07-28: 400 mg via ORAL
  Filled 2015-07-28: qty 1

## 2015-07-28 MED ORDER — POTASSIUM CHLORIDE CRYS ER 20 MEQ PO TBCR
40.0000 meq | EXTENDED_RELEASE_TABLET | ORAL | Status: AC
  Administered 2015-07-28 (×3): 40 meq via ORAL
  Filled 2015-07-28 (×3): qty 2

## 2015-07-28 NOTE — Progress Notes (Signed)
PROGRESS NOTE    KIAH VANALSTINE  NWG:[id number] DOB: July 11, 1955 DOA: 07/26/2015 PCP: Elige Radon Dettinger, MD     Brief Narrative:  60 year old woman admitted on 6/18 from home with complaints of confusion. She has a history of cirrhosis and hepatic encephalopathy who has been having generalized weakness and poor by mouth intake. On arrival she was found to be hypokalemic, hyponatremic and with acute renal failure and was admitted for further evaluation and management.   Assessment & Plan:   Active Problems:   Hepatic cirrhosis (HCC)   Thrombocytopenia (HCC)   Anemia of chronic renal failure, stage 3 (moderate)   Hyponatremia   Hypokalemia   Hepatic encephalopathy (HCC)   Acute renal failure (ARF) (HCC)   Palliative care encounter   Goals of care, counseling/discussion   DNR (do not resuscitate)   Acute encephalopathy -Mostly resolved. -Etiology not 100% percent clear, but suspect given rapid resolution was most likely related to dehydration and hyponatremia more so than hepatic encephalopathy. -Continue lactulose given elevated ammonia and history of hepatic encephalopathy in the past. -Ammonia level decreased to 50 from 80 on admission. Continue lactulose and rifaximin.  Hyponatremia -Sodium has improved to 134 with IV fluids. -Will stop IV fluids.  Acute on chronic kidney disease stage III -Baseline creatinine appears to be between 1.5 and 1.7. -Creatinine 2.4 at admission down to 1.99 today.  Hypokalemia/hypomagnesemia -Continue to replete.   DVT prophylaxis: SCDs Code Status: DO NOT RESUSCITATE Family Communication: Patient only Disposition Plan: Hopefully discharge home over the next 24-48 hours  Consultants:   None  Procedures:   None  Antimicrobials:   None    Subjective: Sitting up in bed, eating lunch, states she feels improved.  Objective: Filed Vitals:   07/28/15 0159 07/28/15 0500 07/28/15 0713 07/28/15 1416  BP: 105/57  97/41 129/60    Pulse: 117  121 113  Temp: 98.5 F (36.9 C)  98.1 F (36.7 C) 97.7 F (36.5 C)  TempSrc: Oral  Oral Oral  Resp: Height:      Weight:  82.781 kg (182 lb 8 oz)    SpO2: 100%  100% 100%    Intake/Output Summary (Last 24 hours) at 07/28/15 1545 Last data filed at 07/28/15 1157  Gross per 24 hour  Intake 1158.75 ml  Output   2450 ml  Net -1291.25 ml   Filed Weights   07/26/15 2257 07/26/15 2336 07/28/15 0500  Weight: 92.1 kg (203 lb 0.7 oz) 84.5 kg (186 lb 4.6 oz) 82.781 kg (182 lb 8 oz)    Examination:  General exam: Alert, awake, oriented x 3 Respiratory system: Clear to auscultation. Respiratory effort normal. Cardiovascular system:RRR. No murmurs, rubs, gallops. Gastrointestinal system: Abdomen is nondistended, soft and nontender. No organomegaly or masses felt. Normal bowel sounds heard. Central nervous system: Alert and oriented. No focal neurological deficits. Extremities: No C/C/E, +pedal pulses Skin: No rashes, lesions or ulcers    Data Reviewed: I have personally reviewed following labs and imaging studies  CBC:  Recent Labs Lab 07/26/15 2027 07/27/15 0448 07/28/15 0617  WBC 11.0* 11.0* 9.2  NEUTROABS 9.3*  --   --   HGB 9.1* 8.8* 8.8*  HCT 25.6* 24.3* 24.7*  MCV 86.5 86.5 88.2  PLT 85* 92* 66*   Basic Metabolic Panel:  Recent Labs Lab 07/26/15 2027 07/27/15 0448 07/28/15 0617  NA 126* 130* 134*  K 2.2* 2.6* 2.9*  CL 110 113* 118*  CO2 11* 10* 12*  GLUCOSE 179* 126* 122*  BUN 48* 47* 46*  CREATININE 2.44* 2.29* 1.99*  CALCIUM 7.7* 7.8* 8.2*  MG  --  1.3* 1.6*   GFR: Estimated Creatinine Clearance: 32.3 mL/min (by C-G formula based on Cr of 1.99). Liver Function Tests:  Recent Labs Lab 07/26/15 2027 07/27/15 0448  AST 25 23  ALT 18 17  ALKPHOS 130* 120  BILITOT 2.0* 2.1*  PROT 5.2* 4.9*  ALBUMIN 2.7* 2.7*   No results for input(s): LIPASE, AMYLASE in the last 168 hours.  Recent Labs Lab 07/26/15 2027  07/28/15 0618  AMMONIA 80* 50*   Coagulation Profile: No results for input(s): INR, PROTIME in the last 168 hours. Cardiac Enzymes: No results for input(s): CKTOTAL, CKMB, CKMBINDEX, TROPONINI in the last 168 hours. BNP (last 3 results) No results for input(s): PROBNP in the last 8760 hours. HbA1C: No results for input(s): HGBA1C in the last 72 hours. CBG: No results for input(s): GLUCAP in the last 168 hours. Lipid Profile: No results for input(s): CHOL, HDL, LDLCALC, TRIG, CHOLHDL, LDLDIRECT in the last 72 hours. Thyroid Function Tests: No results for input(s): TSH, T4TOTAL, FREET4, T3FREE, THYROIDAB in the last 72 hours. Anemia Panel: No results for input(s): VITAMINB12, FOLATE, FERRITIN, TIBC, IRON, RETICCTPCT in the last 72 hours. Urine analysis:    Component Value Date/Time   COLORURINE YELLOW 07/26/2015 2000   APPEARANCEUR HAZY* 07/26/2015 2000   APPEARANCEUR Hazy* 07/07/2015 1034   LABSPEC 1.010 07/26/2015 2000   PHURINE 6.0 07/26/2015 2000   GLUCOSEU NEGATIVE 07/26/2015 2000   HGBUR SMALL* 07/26/2015 2000   BILIRUBINUR NEGATIVE 07/26/2015 2000   BILIRUBINUR Negative 07/07/2015 1034   BILIRUBINUR negative 02/23/2015 1751   KETONESUR NEGATIVE 07/26/2015 2000   PROTEINUR NEGATIVE 07/26/2015 2000   PROTEINUR 1+* 07/07/2015 1034   PROTEINUR 2+ 02/23/2015 1751   UROBILINOGEN negative 02/23/2015 1751   UROBILINOGEN 0.2 06/09/2014 0545   NITRITE NEGATIVE 07/26/2015 2000   NITRITE Negative 07/07/2015 1034   NITRITE negative 02/23/2015 1751   LEUKOCYTESUR MODERATE* 07/26/2015 2000   LEUKOCYTESUR 3+* 07/07/2015 1034   Sepsis Labs: @LABRCNTIP (procalcitonin:4,lacticidven:4)  ) Recent Results (from the past 240 hour(s))  Urine culture     Status: Abnormal   Collection Time: 07/26/15  8:00 PM  Result Value Ref Range Status   Specimen Description URINE, CLEAN CATCH  Final   Special Requests NONE  Final   Culture MULTIPLE SPECIES PRESENT, SUGGEST RECOLLECTION (A)  Final    Report Status 07/28/2015 FINAL  Final  MRSA PCR Screening     Status: Abnormal   Collection Time: 07/26/15 11:56 PM  Result Value Ref Range Status   MRSA by PCR POSITIVE (A) NEGATIVE Final    Comment:        The GeneXpert MRSA Assay (FDA approved for NASAL specimens only), is one component of a comprehensive MRSA colonization surveillance program. It is not intended to diagnose MRSA infection nor to guide or monitor treatment for MRSA infections. RESULT CALLED TO, READ BACK BY AND VERIFIED WITH: SPANGLER,E. AT 1015 ON 07/27/2015 BY BAUGHAM,M.          Radiology Studies: No results found.      Scheduled Meds: . Chlorhexidine Gluconate Cloth  6 each Topical Q0600  . feeding supplement (ENSURE ENLIVE)  237 mL Oral Q24H  . feeding supplement (PRO-STAT SUGAR FREE 64)  30 mL Oral TID WC  . gabapentin  100 mg Oral TID  . lactulose  30 g Oral TID  . midodrine  5 mg  Oral TID WC  . mupirocin ointment  1 application Nasal BID  . pantoprazole  40 mg Oral Daily  . potassium chloride  40 mEq Oral Daily  . rifaximin  550 mg Oral BID  . saccharomyces boulardii  250 mg Oral Daily  . sodium chloride flush  3 mL Intravenous Q12H  . spironolactone  50 mg Oral Daily   Continuous Infusions:    LOS: 2 days    Time spent: 25 minutes. Greater than 50% of this time was spent in direct contact with the patient coordinating care.     Chaya Jan, MD [facility] Pager [phone]  If 7PM-7AM, please contact night-coverage [email or web address] Password [facility] 07/28/2015, 3:45 PM

## 2015-07-28 NOTE — Consult Note (Signed)
Consultation Note Date: 07/28/2015   Patient Name: Kathryn [name]  DOB: 21-Nov-1955  MRN: [id number]  Age / Sex: 60 y.o., female  PCP: Nils Pyle, MD Referring Physician: Micael Hampshire Acost*  Reason for Consultation: Disposition, Establishing goals of care, Hospice Evaluation, Non pain symptom management and Psychosocial/spiritual support  HPI/Patient Profile: 59 y.o. female  with past medical history of Crohn's disease with ileostomy approximately 20 years ago per patient, cirrhosis with hepatic encephalopathy, chronic kidney disease admitted on 07/26/2015 with low potassium and acute on chronic renal failure.    Clinical Assessment and Goals of Care: Kathryn [name] is sitting up in bed watching television, she makes eye contact as I enter. She is having difficulty with her eye hand coordination, in reducing the volume on the television. There is no family at bedside at this time. She says she is married to her husband Kathryn [name] for 20 years, and she has an adult son, Kathryn [name]. She also talks about her 2 dogs (black and white Kathryn [name], and 30 pound Kathryn [name], Kathryn [name]).   She has slowed thoughts and response, latency of speech.   We talk about her chronic health problems including her Crohn's disease with an ileostomy 20 years ago, her cirrhosis with hepatic encephalopathy, and also her acute on chronic kidney disease.  I share my worry over her decline, and her 8 hospitalizations in the last 6 months. She asks if the hospital will, "kick me out".  I try to reassure her that she would not be in the hospital if she did not need to be, and that I mention her hospitalizations as a way to show how sick she really is.  I share a diagram of the chronic illness trajectory.  I ask where she sees herself in 6 months or one year, and she replies, "to tell the truth, I'll be dead".  I ask if Kathryn [name] is aware of  how sick she is, she states, "he's in denial". She also shares that her elder sister, "doesn't believe me" in relation to her chronic illness.    We talk about her choices, including advanced directives, the concept of allowing natural death.  We talk about the concepts of quality over quantity, and Kathryn [name] states that quality is more important to her. I ask if there is any milestone that she would like to reach, or any special trip that she would like to take. She shares that travel is out of the question for her at this time, and that she is happy just being in her home. We talk about the benefits of hospice in her home. She shares that she goes to church with Kathryn [name], the director of hospice of [facility]. Kathryn [name] shares that she would be open to having hospice come into her home. She had said previously that she did not want home health because, "don't like people in my home, I like my privacy". We talk about the benefits of hospice, the services offered and also the validation that she  is very ill. I share with Kathryn [name] that it would be best for [name] to talk with [facility] before making the referral, and she agrees. Kathryn [name] denies other needs are issues at this time.  Healthcare power of attorney NEXT OF KIN no paperwork completed, but [name] states that she wants her husband Kathryn [name] to make health care decisions if she is unable.   SUMMARY OF RECOMMENDATIONS   continue to treat the treatable, requesting referral to hospice of [facility] for in-home services. [name] agrees to have palliative medicine team discussed this with her husband tomorrow. [name] shares that she goes to church with director Kathryn [name].  Code Status/Advance Care Planning:  DNR, allow a natural death  Symptom Management:   per hospitalist  Palliative Prophylaxis:   Bowel Regimen, Frequent Pain Assessment, Oral Care and Turn Reposition  Additional Recommendations (Limitations, Scope,  Preferences):  Continue to treat the treatable, accepting of in-home hospice from [facility], we discussed that she may not be accepted at this time, but would at least remain on surveillance. We discuss stopping Xifaxan, and using lactulose only, per hospice guidelines.  Kathryn [name] states, "anything I can get rid of", in relation to her medication regimen burden.  Psycho-social/Spiritual:   Desire for further Chaplaincy support:no  Additional Recommendations: Caregiving  Support/Resources and Education on Hospice  Prognosis:   < 6 months, likely based on chronic disease burden related to Crohn's disease, hepatic cirrhosis, encephalopathy, acute on chronic kidney disease, 8 hospitalizations in 6 months, functional decline.    Discharge Planning: we discussed home with the benefits of hospice of [facility], but this will need to be discussed with husband Kathryn [name] prior to making hospice referral. To Be Determined     Primary Diagnoses: Present on Admission:  . Acute renal failure (ARF) (HCC) . Hepatic encephalopathy (HCC) . Hypokalemia . Hyponatremia . Thrombocytopenia (HCC) . Hepatic cirrhosis (HCC) . Anemia of chronic renal failure, stage 3 (moderate)  I have reviewed the medical record, interviewed the patient and family, and examined the patient. The following aspects are pertinent.  Past Medical History  Diagnosis Date  . Hypertension   . Enteritis presumed infectious   . Crohn's   . Anemia   . Hepatitis C antibody test positive   . Hepatitis   . Renal insufficiency   . Cirrhosis (HCC)   . Splenomegaly   . Bilateral leg edema 01/2014  . Chronic back pain   . Thrombocytopenia (HCC)   . Anemia of chronic renal failure, stage 3 (moderate) 11/25/2013   Social History   Social History  . Marital Status: Married    Spouse Name: N/A  . Number of Children: N/A  . Years of Education: N/A   Social History Main Topics  . Smoking status: Never Smoker    . Smokeless tobacco: Never Used  . Alcohol Use: No  . Drug Use: No  . Sexual Activity: Yes   Other Topics Concern  . None   Social History Narrative   Family History  Problem Relation Age of Onset  . Cancer Mother   . Stroke Father    Scheduled Meds: . Chlorhexidine Gluconate Cloth  6 each Topical Q0600  . feeding supplement (ENSURE ENLIVE)  237 mL Oral Q24H  . feeding supplement (PRO-STAT SUGAR FREE 64)  30 mL Oral TID WC  . gabapentin  100 mg Oral TID  . lactulose  30 g Oral TID  . midodrine  5 mg Oral TID WC  . mupirocin ointment  1 application Nasal BID  . pantoprazole  40 mg Oral Daily  . potassium chloride  40 mEq Oral Daily  . rifaximin  550 mg Oral BID  . saccharomyces boulardii  250 mg Oral Daily  . sodium chloride flush  3 mL Intravenous Q12H  . spironolactone  50 mg Oral Daily   Continuous Infusions:  PRN Meds:.ALPRAZolam Medications Prior to Admission:  Prior to Admission medications   Medication Sig Start Date End Date Taking? Authorizing Provider  ALPRAZolam Prudy Feeler) 0.5 MG tablet Take 1 tablet (0.5 mg total) by mouth at bedtime as needed for anxiety. 07/02/15  Yes Rolly Salter, MD  feeding supplement, ENSURE ENLIVE, (ENSURE ENLIVE) LIQD Take 237 mLs by mouth daily. 03/27/15  Yes Standley Brooking, MD  furosemide (LASIX) 40 MG tablet Take 1 tablet (40 mg total) by mouth daily. 07/23/15  Yes Elige Radon Dettinger, MD  gabapentin (NEURONTIN) 100 MG capsule TAKE 1 CAPSULE THREE TIMES DAILY 07/24/15  Yes Elige Radon Dettinger, MD  lactulose (CHRONULAC) 10 GM/15ML solution Take 45 mLs (30 g total) by mouth 3 (three) times daily. 07/02/15  Yes Rolly Salter, MD  midodrine (PROAMATINE) 5 MG tablet TAKE 1 TABLET BY MOUTH THREE TIMES DAILY WITH MEALS 07/24/15  Yes Elige Radon Dettinger, MD  pantoprazole (PROTONIX) 40 MG tablet TAKE 1 TABLET BY MOUTH DAILY 06/09/14  Yes Len Blalock, NP  potassium chloride (KLOR-CON) 20 MEQ packet Take 40 mEq by mouth daily. 07/24/15  Yes Elige Radon  Dettinger, MD  Probiotic Product (PROBIOTIC PO) Take 1 tablet by mouth daily.   Yes Historical Provider, MD  rifaximin (XIFAXAN) 550 MG TABS tablet Take 1 tablet (550 mg total) by mouth 2 (two) times daily. 07/02/15  Yes Rolly Salter, MD  spironolactone (ALDACTONE) 25 MG tablet TAKE 2 TABLETS BY MOUTH EVERY DAY 07/24/15  Yes Elige Radon Dettinger, MD  Amino Acids-Protein Hydrolys (FEEDING SUPPLEMENT, PRO-STAT SUGAR FREE 64,) LIQD Take 30 mLs by mouth 3 (three) times daily with meals. 07/02/15   Rolly Salter, MD  ciprofloxacin (CIPRO) 250 MG tablet Take 1 tablet (250 mg total) by mouth 2 (two) times daily. Patient not taking: Reported on 07/26/2015 07/07/15   Elige Radon Dettinger, MD  mesalamine (CANASA) 1000 MG suppository Place 1 suppository (1,000 mg total) rectally at bedtime. Patient not taking: Reported on 07/26/2015 04/22/15   Erick Blinks, MD   Allergies  Allergen Reactions  . Penicillins Rash    Has patient had a PCN reaction causing immediate rash, facial/tongue/throat swelling, SOB or lightheadedness with hypotension: Yes Has patient had a PCN reaction causing severe rash involving mucus membranes or skin necrosis: No Has patient had a PCN reaction that required hospitalization No Has patient had a PCN reaction occurring within the last 10 years: No If all of the above answers are "NO", then may proceed with Cephalosporin use.    Review of Systems  Unable to perform ROS: Mental status change    Physical Exam  Constitutional: She is oriented to person, place, and time. No distress.  Makes and keeps eye contact, latent speech, difficult time with eye hand coordination and problem-solving.  HENT:  Head: Normocephalic and atraumatic.  Cardiovascular: Normal rate and regular rhythm.   Pulmonary/Chest: Effort normal. No respiratory distress.  Abdominal: Soft. She exhibits no distension.  Ileostomy  Neurological: She is alert and oriented to person, place, and time.  Latency of speech  noted  Skin: Skin is warm and dry.  Somewhat jaundiced  Nursing note and vitals reviewed.   Vital Signs: BP 97/41 mmHg  Pulse 121  Temp(Src) 98.1 F (36.7 C) (Oral)  Resp 20  Ht 5\' 5"  (1.651 m)  Wt 82.781 kg (182 lb 8 oz)  BMI 30.37 kg/m2  SpO2 100% Pain Assessment: No/denies pain       SpO2: SpO2: 100 % O2 Device:SpO2: 100 % O2 Flow Rate: .   IO: Intake/output summary:  Intake/Output Summary (Last 24 hours) at 07/28/15 1107 Last data filed at 07/28/15 0600  Gross per 24 hour  Intake 918.75 ml  Output   3050 ml  Net -2131.25 ml    LBM: Last BM Date: 07/27/15 Baseline Weight: Weight: 86.183 kg (190 lb) Most recent weight: Weight: 82.781 kg (182 lb 8 oz)     Palliative Assessment/Data:   Flowsheet Rows        Most Recent Value   Intake Tab    Referral Department  Hospitalist   Unit at Time of Referral  Med/Surg Unit   Palliative Care Primary Diagnosis  Other (Comment) [GI/nephrology]   Date Notified  07/27/15   Palliative Care Type  New Palliative care   Date of Admission  07/26/15   Date first seen by Palliative Care  07/28/15   # of days Palliative referral response time  1 Day(s)   # of days IP prior to Palliative referral  1   Clinical Assessment    Palliative Performance Scale Score  50%   Pain Max last 24 hours  Not able to report   Pain Min Last 24 hours  Not able to report   Dyspnea Max Last 24 Hours  Not able to report   Dyspnea Min Last 24 hours  Not able to report   Psychosocial & Spiritual Assessment    Palliative Care Outcomes    Patient/Family meeting held?  Yes   Who was at the meeting?  Patient only at this time   Palliative Care Outcomes  Clarified goals of care, Counseled regarding hospice, Provided psychosocial or spiritual support   Patient/Family wishes: Interventions discontinued/not started   Mechanical Ventilation   Palliative Care follow-up planned  -- [Follow-up while at [facility]]      Time In: 1000 Time Out: 1100 Time Total: 60  minutes Greater than 50%  of this time was spent counseling and coordinating care related to the above assessment and plan.  Signed by: Katheran Awe, NP   Please contact Palliative Medicine Team phone at [phone] for questions and concerns.  For individual provider: See Loretha Stapler

## 2015-07-29 DIAGNOSIS — K7030 Alcoholic cirrhosis of liver without ascites: Secondary | ICD-10-CM

## 2015-07-29 MED ORDER — MAGNESIUM OXIDE 400 (241.3 MG) MG PO TABS: 400.0000 mg | ORAL_TABLET | Freq: Two times a day (BID) | ORAL | Status: AC

## 2015-07-29 NOTE — Discharge Summary (Signed)
Physician Discharge Summary  Kathryn [name] ZOX:[id number] DOB: 1955/11/08 DOA: 07/26/2015  PCP: Elige Radon Dettinger, MD  Admit date: 07/26/2015 Discharge date: 07/29/2015  Time spent: 45 minutes  Recommendations for Outpatient Follow-up:  -Will be discharged home today. -Advised to follow-up with primary care provider in 2 weeks.   Discharge Diagnoses:  Active Problems:   Hepatic cirrhosis (HCC)   Thrombocytopenia (HCC)   Anemia of chronic renal failure, stage 3 (moderate)   Hyponatremia   Hypokalemia   Hepatic encephalopathy (HCC)   Acute renal failure (ARF) (HCC)   Palliative care encounter   Goals of care, counseling/discussion   DNR (do not resuscitate)   Discharge Condition: Stable and improved  Filed Weights   07/26/15 2336 07/28/15 0500 07/29/15 0639  Weight: 84.5 kg (186 lb 4.6 oz) 82.781 kg (182 lb 8 oz) 83.915 kg (185 lb)    History of present illness:  As per Dr. Selena Batten on 6/18:  Kathryn [name] is a 60 y.o. female, with CIrrhosis, Hepatic encephalopathy Crohns disease, apparently c/o generalized weakness over the past several days as well as twitching of her legs last nite. Pt has had poor po intake and has not been taking her potassium according to her family. Pt was brought to ED for evaluation.   In ED, Pt found to be hypokalemic (K= 2.2) and to have hyponatremia,(Na=126) and mild ARF (Bun/creat 48/2.44), pt will be admitted for severe hypokalemia and ARF on CRF.   Hospital Course:   Acute encephalopathy -Resolved. -Etiology not 100% percent clear, but suspect given rapid resolution was most likely related to dehydration and hyponatremia more so than hepatic encephalopathy. -Continue lactulose given elevated ammonia and history of hepatic encephalopathy in the past. -Ammonia level decreased to 50 from 80 on admission. Continue lactulose and rifaximin on DC.  Hyponatremia -Resolved with IVF.  Acute on chronic kidney disease stage III -Baseline  creatinine appears to be between 1.5 and 1.7. -Creatinine 2.4 at admission down to 1.99 on DC.  Hypokalemia/hypomagnesemia -Replaced  Procedures:  None   Consultations:  None  Discharge Instructions  Discharge Instructions    Diet - low sodium heart healthy    Complete by:  As directed      Increase activity slowly    Complete by:  As directed             Medication List    STOP taking these medications        ciprofloxacin 250 MG tablet  Commonly known as:  CIPRO     mesalamine 1000 MG suppository  Commonly known as:  CANASA      TAKE these medications        ALPRAZolam 0.5 MG tablet  Commonly known as:  XANAX  Take 1 tablet (0.5 mg total) by mouth at bedtime as needed for anxiety.     feeding supplement (ENSURE ENLIVE) Liqd  Take 237 mLs by mouth daily.     feeding supplement (PRO-STAT SUGAR FREE 64) Liqd  Take 30 mLs by mouth 3 (three) times daily with meals.     furosemide 40 MG tablet  Commonly known as:  LASIX  Take 1 tablet (40 mg total) by mouth daily.     gabapentin 100 MG capsule  Commonly known as:  NEURONTIN  TAKE 1 CAPSULE THREE TIMES DAILY     lactulose 10 GM/15ML solution  Commonly known as:  CHRONULAC  Take 45 mLs (30 g total) by mouth 3 (three) times daily.  magnesium oxide 400 (241.3 Mg) MG tablet  Commonly known as:  MAG-OX  Take 1 tablet (400 mg total) by mouth 2 (two) times daily.     midodrine 5 MG tablet  Commonly known as:  PROAMATINE  TAKE 1 TABLET BY MOUTH THREE TIMES DAILY WITH MEALS     pantoprazole 40 MG tablet  Commonly known as:  PROTONIX  TAKE 1 TABLET BY MOUTH DAILY     potassium chloride 20 MEQ packet  Commonly known as:  KLOR-CON  Take 40 mEq by mouth daily.     PROBIOTIC PO  Take 1 tablet by mouth daily.     rifaximin 550 MG Tabs tablet  Commonly known as:  XIFAXAN  Take 1 tablet (550 mg total) by mouth 2 (two) times daily.     spironolactone 25 MG tablet  Commonly known as:  ALDACTONE  TAKE 2  TABLETS BY MOUTH EVERY DAY       Allergies  Allergen Reactions  . Penicillins Rash    Has patient had a PCN reaction causing immediate rash, facial/tongue/throat swelling, SOB or lightheadedness with hypotension: Yes Has patient had a PCN reaction causing severe rash involving mucus membranes or skin necrosis: No Has patient had a PCN reaction that required hospitalization No Has patient had a PCN reaction occurring within the last 10 years: No If all of the above answers are "NO", then may proceed with Cephalosporin use.        Follow-up Information    Follow up with Nils Pyle, MD. Schedule an appointment as soon as possible for a visit in 2 weeks.   Specialties:  Family Medicine, Cardiology   Contact information:   [address] [place] [address] [phone]       Follow up with [facility].   Why:  please call [phone] when you get home and they will come out and see you   Contact information:   [address] [place] [address] [phone]        The results of significant diagnostics from this hospitalization (including imaging, microbiology, ancillary and laboratory) are listed below for reference.    Significant Diagnostic Studies: Dg Chest 2 View  07/14/2015  CLINICAL DATA:  60 year old female with shortness of breath EXAM: CHEST  2 VIEW COMPARISON:  Chest radiograph dated 06/10/2015 FINDINGS: The heart size and mediastinal contours are within normal limits. Both lungs are clear. The visualized skeletal structures are unremarkable. IMPRESSION: No active cardiopulmonary disease. Electronically Signed   By: Elgie Collard M.D.   On: 07/14/2015 00:58   Nm Pulmonary Perf And Vent  07/14/2015  CLINICAL DATA:  Shortness of breath and persistent cough. Inpatient. EXAM: NUCLEAR MEDICINE VENTILATION - PERFUSION LUNG SCAN TECHNIQUE: Ventilation images were obtained in multiple projections using inhaled aerosol Tc-36m DTPA. Perfusion images were  obtained in multiple projections after intravenous injection of Tc-22m MAA. RADIOPHARMACEUTICALS:  33 mCi Technetium-71m DTPA aerosol inhalation and 4.4 mCi Technetium-8m MAA IV COMPARISON:  Chest radiograph from earlier today. FINDINGS: Ventilation: There is accumulation of radiotracer in the central airways with heterogeneous ventilation of both lungs. Perfusion: There are two small segmental matched perfusion defects in the right upper lung. Otherwise normal perfusion. IMPRESSION: Very low probability for pulmonary embolism (0-9%). Electronically Signed   By: Delbert Phenix M.D.   On: 07/14/2015 08:29   US Venous Img Lower Unilateral Right  07/01/2015  CLINICAL DATA:  60 year old female with a history of edema EXAM: RIGHT LOWER EXTREMITY VENOUS  DOPPLER ULTRASOUND TECHNIQUE: Gray-scale sonography with graded compression, as well as color Doppler and duplex ultrasound were performed to evaluate the lower extremity deep venous systems from the level of the common femoral vein and including the common femoral, femoral, profunda femoral, popliteal and calf veins including the posterior tibial, peroneal and gastrocnemius veins when visible. The superficial great saphenous vein was also interrogated. Spectral Doppler was utilized to evaluate flow at rest and with distal augmentation maneuvers in the common femoral, femoral and popliteal veins. COMPARISON:  None. FINDINGS: Contralateral Common Femoral Vein: Respiratory phasicity is normal and symmetric with the symptomatic side. No evidence of thrombus. Normal compressibility. Common Femoral Vein: No evidence of thrombus. Normal compressibility, respiratory phasicity and response to augmentation. Saphenofemoral Junction: No evidence of thrombus. Normal compressibility and flow on color Doppler imaging. Profunda Femoral Vein: No evidence of thrombus. Normal compressibility and flow on color Doppler imaging. Femoral Vein: No evidence of thrombus. Normal compressibility,  respiratory phasicity and response to augmentation. Popliteal Vein: No evidence of thrombus. Normal compressibility, respiratory phasicity and response to augmentation. Calf Veins: No evidence of thrombus. Normal compressibility and flow on color Doppler imaging. Superficial Great Saphenous Vein: No evidence of thrombus. Normal compressibility and flow on color Doppler imaging. Other Findings:  None. IMPRESSION: Sonographic survey of the right lower extremity negative for DVT. Signed, Yvone Neu. Loreta Ave, DO [facility] Electronically Signed   By: Gilmer Mor D.O.   On: 07/01/2015 15:06    Microbiology: Recent Results (from the past 240 hour(s))  Urine culture     Status: Abnormal   Collection Time: 07/26/15  8:00 PM  Result Value Ref Range Status   Specimen Description URINE, CLEAN CATCH  Final   Special Requests NONE  Final   Culture MULTIPLE SPECIES PRESENT, SUGGEST RECOLLECTION (A)  Final   Report Status 07/28/2015 FINAL  Final  MRSA PCR Screening     Status: Abnormal   Collection Time: 07/26/15 11:56 PM  Result Value Ref Range Status   MRSA by PCR POSITIVE (A) NEGATIVE Final    Comment:        The GeneXpert MRSA Assay (FDA approved for NASAL specimens only), is one component of a comprehensive MRSA colonization surveillance program. It is not intended to diagnose MRSA infection nor to guide or monitor treatment for MRSA infections. RESULT CALLED TO, READ BACK BY AND VERIFIED WITH: SPANGLER,E. AT 1015 ON 07/27/2015 BY BAUGHAM,M.      Labs: Basic Metabolic Panel:  Recent Labs Lab 07/26/15 2027 07/27/15 0448 07/28/15 0617  NA 126* 130* 134*  K 2.2* 2.6* 2.9*  CL 110 113* 118*  CO2 11* 10* 12*  GLUCOSE 179* 126* 122*  BUN 48* 47* 46*  CREATININE 2.44* 2.29* 1.99*  CALCIUM 7.7* 7.8* 8.2*  MG  --  1.3* 1.6*   Liver Function Tests:  Recent Labs Lab 07/26/15 2027 07/27/15 0448  AST 25 23  ALT 18 17  ALKPHOS  130* 120  BILITOT 2.0* 2.1*  PROT 5.2* 4.9*  ALBUMIN 2.7* 2.7*   No results for input(s): LIPASE, AMYLASE in the last 168 hours.  Recent Labs Lab 07/26/15 2027 07/28/15 0618  AMMONIA 80* 50*   CBC:  Recent Labs Lab 07/26/15 2027 07/27/15 0448 07/28/15 0617  WBC 11.0* 11.0* 9.2  NEUTROABS 9.3*  --   --   HGB 9.1* 8.8* 8.8*  HCT 25.6* 24.3* 24.7*  MCV 86.5 86.5 88.2  PLT 85* 92* 66*   Cardiac Enzymes: No  results for input(s): CKTOTAL, CKMB, CKMBINDEX, TROPONINI in the last 168 hours. BNP: BNP (last 3 results)  Recent Labs  03/26/15 0345 07/14/15 0059  BNP 74.0 44.0    ProBNP (last 3 results) No results for input(s): PROBNP in the last 8760 hours.  CBG: No results for input(s): GLUCAP in the last 168 hours.     SignedChaya Jan  [facility] Pager: [phone] 07/29/2015, 3:15 PM

## 2015-07-29 NOTE — Care Management Note (Signed)
Case Management Note  Patient Details  Name: Kathryn [name] MRN: [id number] Date of Birth: [birth year]   Expected Discharge Date:    07/29/2015              Expected Discharge Plan:  Home w Hospice Care  In-House Referral:  Hospice / Palliative Care  Discharge planning Services  CM Consult  Post Acute Care Choice:  NA Choice offered to:  NA  DME Arranged:    DME Agency:     HH Arranged:    HH Agency:  [facility]  Status of Service:  Completed, signed off  If discussed at [employer] of Stay Meetings, dates discussed:    Additional Comments: Pt discharging home today. Pt has elected to DC with hospice services and has requested [facility]. Referral faxed and verified via phone with Shon Hale at hospice, they will be able to admit pt to their services. Pt has been order RW and pt chose [facility] from list of DME providers Alroy Bailiff of [facility], made aware of referral and will obtain pt info from chart and deliver to pt's room prior to DC. Pt discharging home today.   Malcolm Metro, RN 07/29/2015, 1:50 PM

## 2015-07-29 NOTE — Care Management (Signed)
Patient Information    Patient Name Sex Kathryn [name]   Kathryn, [name] ([id number]) Female 1955/11/01     Room Bed   A301 A301-01    Patient Demographics    Address Phone   [address] [place] [address] [phone] (Home) *Preferred*    Patient Ethnicity & Race    Ethnic Group Patient Race   Not Hispanic or Latino White or Caucasian    Emergency Contact(s)    Name Relation Home Work Mobile   [place] Spouse [phone]    Antonieta Pert [phone]      Documents on File      Status Date Received Description   Documents for the Patient   [place] HIPAA NOTICE OF PRIVACY - Scanned Received 09/02/10    [place] E-Signature HIPAA Notice of Privacy Received 04/14/14    [place] E-Signature HIPAA Notice of Privacy Spanish Not Received     Driver's License Not Received     Insurance Card Received 07/25/14 Monia Pouch [facility]   Advance Directives/Living Will/HCPOA/POA Not Received     Financial Application Not Received     Driver's License Received 10/05/10    Insurance Card Received 11/14/12 RCGD - OLD   [place] HIPAA NOTICE OF PRIVACY - Scanned Received 12/20/12 [facility]D   HIM ROI Authorization Not Received  [facility]D--11/14/12 prog note to PCP (hasanaj)   HIM ROI Authorization Not Received  [facility]D--11/14/12 prog note, 11/30/12 US liver BX & pathology and 11/14/12, 11/20/12 & 11/30/12 labs to [name] for PA on medication   HIM ROI Authorization Not Received  RCGD--12/20/12 prog note to PCP (hasanaj)   AMB Provider Completed Forms Not Received  RX [name]   HIM ROI Authorization Not Received  RCGD--01/14/13 prog note to PCP (hasanaj)   Insurance Card Received 03/05/13 RCGD - OLD   Advanced Beneficiary Notice (ABN) Not Received     Insurance Card   [place] [employer] Card Received 05/14/13 [facility]/AETNA   Driver's License Received 05/14/13 [facility]/MM   HIM ROI Authorization  05/14/13    Release of Information  Received 05/14/13 [facility]/MM   HIM ROI Authorization  06/07/13 [facility]D--09/02/10 to 01/14/13 prog notes, labs, liver biopsy, EGD op [facility] to [facility]   AMB Correspondence  08/16/13 10/10-04/15 OFFICE NOTES [facility]   HIM ROI Authorization  08/20/13 RCGD--08/07/13 & 06/13/13 prog notes and 07/12/13 labs to [facility] for PA on medication   E-Signature AOB Spanish Not Received     Insurance Card Received 02/13/14 aetna/wrfm - RCGD   HIM ROI Authorization  03/19/14    Other Photo ID Not Received     HIM ROI Authorization  07/28/14    Release of Information  08/01/14    Insurance Card Received 04/30/15 [employer] 2017   Insurance Card      HIM ROI Authorization  06/12/15    Insurance Card Received 06/23/15 AETNA MEDICARE/RCGD/HHS   Documents for the Encounter   AOB (Assignment of Insurance Benefits) Not Received     E-signature AOB Received 07/26/15    MEDICARE RIGHTS Not Received     E-signature Medicare Rights Received 07/26/15    Cardiac Monitoring Strip  07/27/15     Admission Information    Attending Provider Admitting Provider Admission Type Admission Date/Time   Estela Isaiah Blakes, MD Pearson Grippe, MD Emergency 07/26/15 1905   Discharge Date Hospital Service Auth/Cert Status Service Area    Internal Medicine Incomplete  SERVICE AREA  Unit Room/Bed Admission Status   AP-DEPT 300 A301/A301-01 Admission (Confirmed)         Admission    Complaint   ?    Hospital Account    Name Acct ID Class Status Primary Coverage   [place], [place] [id number] Inpatient Open AETNA MEDICARE - AETNA MEDICARE HMO/PPO        Guarantor Account (for Hospital Account [id number])    Name Relation to Pt Service Area Active? Acct Type   Andris Baumann Self CHSA Yes Personal/Family   Address Phone       [phone] [place], [place] [address] [phone])          Coverage Information (for Hospital Account [id number])     F/O Payor/Plan Precert #   AETNA MEDICARE/AETNA MEDICARE HMO/PPO    Subscriber Subscriber #   [place], [place] [facility]   Address Phone   [address] [place], [place] [address] [phone]

## 2015-07-29 NOTE — Care Management Important Message (Signed)
Important Message  Patient Details  Name: Kathryn [name] MRN: [id number] Date of Birth: November 03, 1955   Medicare Important Message Given:  Yes    Malcolm Metro, RN 07/29/2015, 9:23 AM

## 2015-07-29 NOTE — Progress Notes (Signed)
Instructions given on discharge medications,and follow up visits,patient ,and family verbalized understanding. Vital signs stable. Accompanied by staff to an awaiting vehicle.

## 2015-07-29 NOTE — Telephone Encounter (Signed)
Message routed to Dr. Louanne Skye

## 2015-08-05 ENCOUNTER — Telehealth: Payer: Self-pay | Admitting: *Deleted

## 2015-08-05 MED ORDER — ALPRAZOLAM 0.5 MG PO TABS: 0.5000 mg | ORAL_TABLET | Freq: Every evening | ORAL | Status: AC | PRN

## 2015-08-05 NOTE — Telephone Encounter (Signed)
Pt has been admitted to Hospice. She needs a xanax refill called into [place], route to pool

## 2015-08-05 NOTE — Telephone Encounter (Signed)
Go ahead and do refills for 3 months

## 2015-08-05 NOTE — Telephone Encounter (Signed)
Rx called in to pharmacy. 

## 2015-08-19 ENCOUNTER — Ambulatory Visit: Payer: Medicare HMO | Admitting: Family Medicine

## 2015-09-08 DEATH — deceased

## 2015-09-23 ENCOUNTER — Ambulatory Visit (INDEPENDENT_AMBULATORY_CARE_PROVIDER_SITE_OTHER): Payer: Medicare HMO | Admitting: Internal Medicine

## 2015-11-02 ENCOUNTER — Ambulatory Visit (INDEPENDENT_AMBULATORY_CARE_PROVIDER_SITE_OTHER): Payer: Medicare HMO | Admitting: Internal Medicine

## 2016-01-23 ENCOUNTER — Other Ambulatory Visit: Payer: Self-pay | Admitting: Nurse Practitioner

## 2018-02-19 IMAGING — CT CT ABD-PELV W/O CM
2 of 4 series · 16 of 46 positions shown, 18 images · non-contrast
Comparison: 03/10/2015

CLINICAL DATA: Left-sided flank pain and generalized weakness.
Cirrhosis. Stage 3 kidney disease.

EXAM:
CT ABDOMEN AND PELVIS WITHOUT CONTRAST
TECHNIQUE: Multidetector CT imaging of the abdomen and pelvis was performed
following the standard protocol without IV contrast.

[Series 2: abdomen/pelvis w/o contrast · axial · non-contrast · 0.65mm/px · z∈[-498,-104]mm · 13 of 87 slices shown, 15 images]
[im 4/87  soft-tissue]
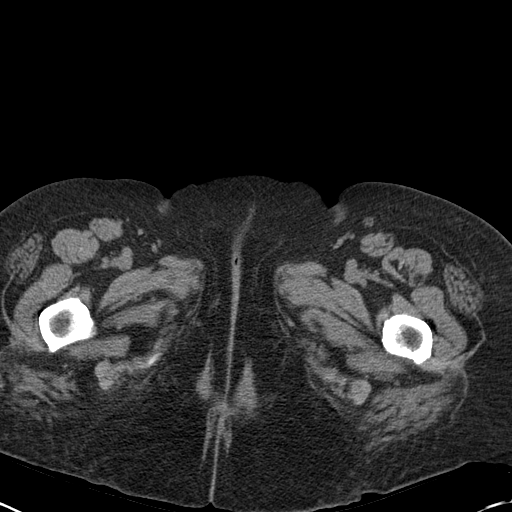
[im 4/87  bone]
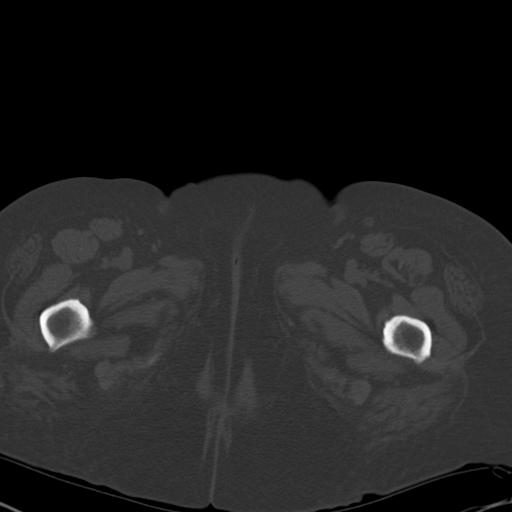
[im 11/87  soft-tissue]
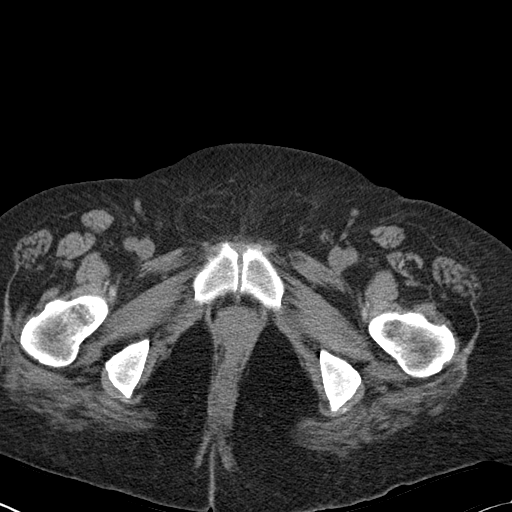
[im 18/87  soft-tissue]
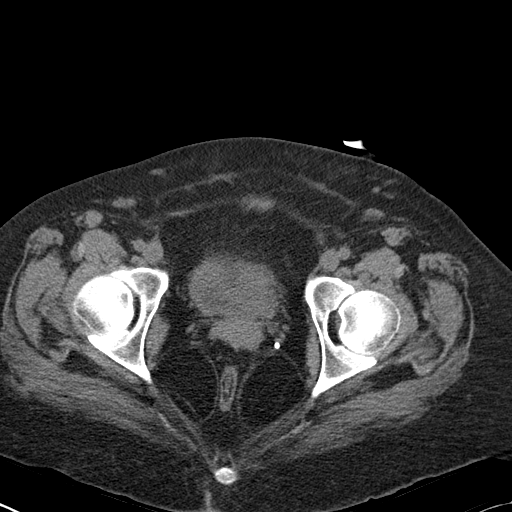
[im 25/87  soft-tissue]
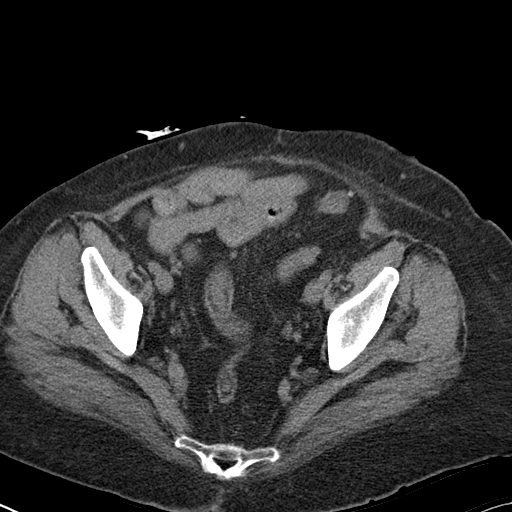
[im 31/87  soft-tissue]
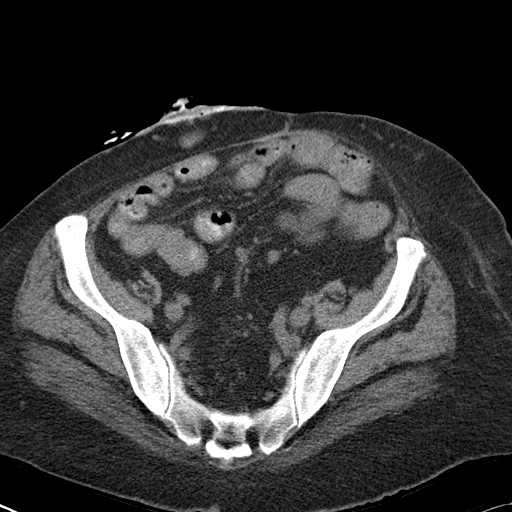
[im 38/87  soft-tissue]
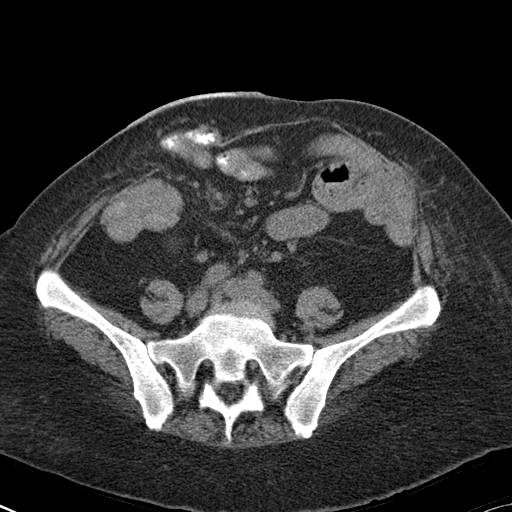
[im 45/87  soft-tissue]
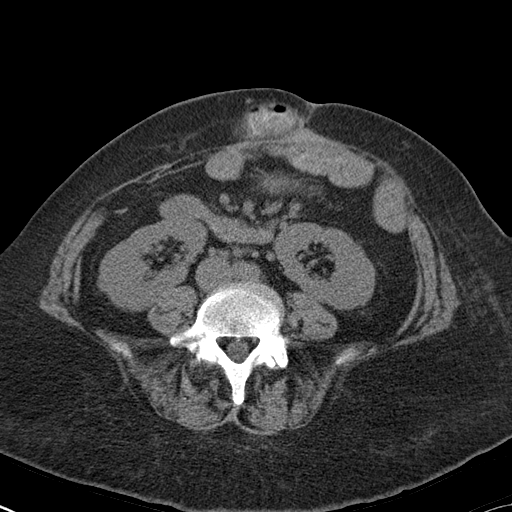
[im 49/87  soft-tissue]
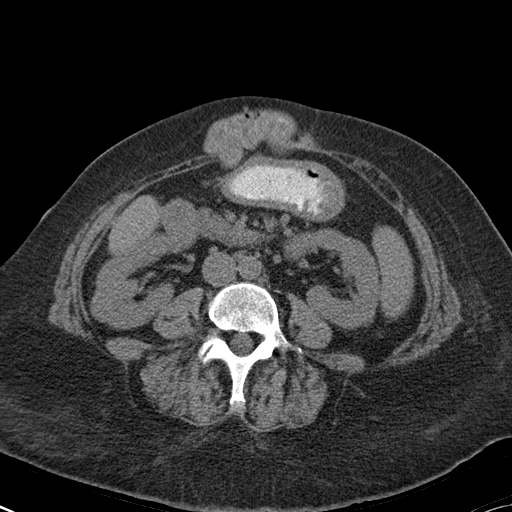
[im 56/87  soft-tissue]
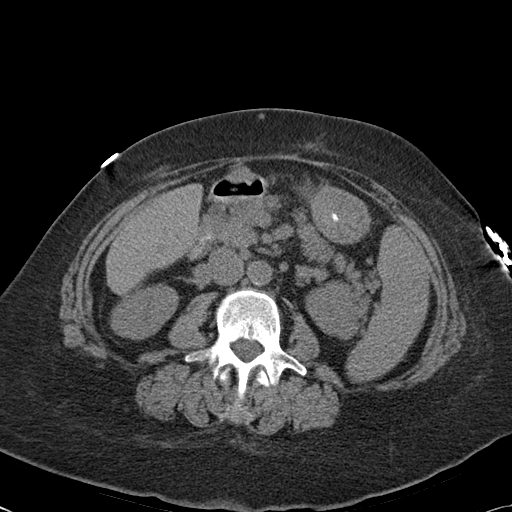
[im 56/87  bone]
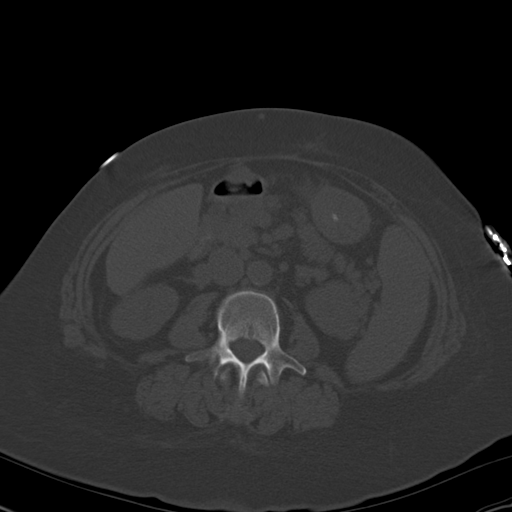
[im 62/87  soft-tissue]
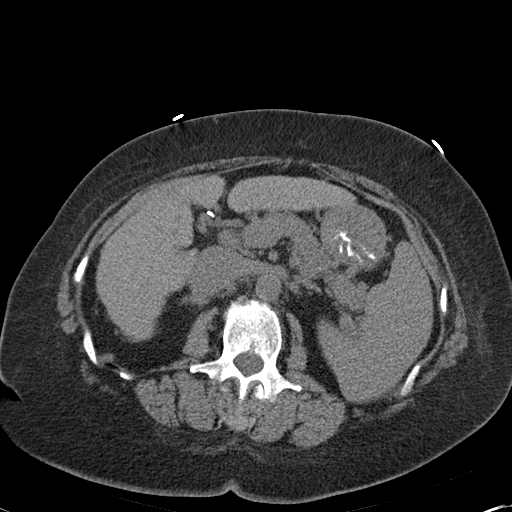
[im 69/87  soft-tissue]
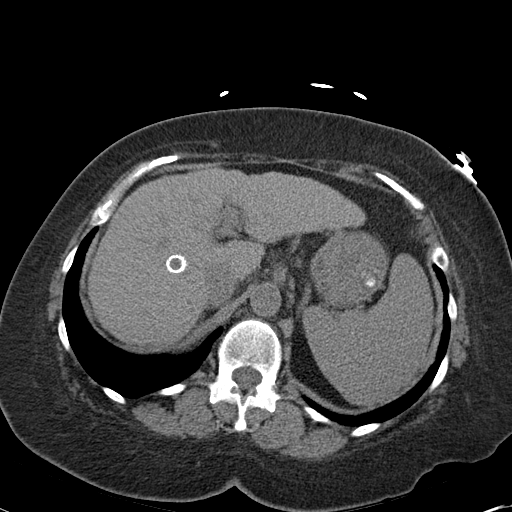
[im 76/87  soft-tissue]
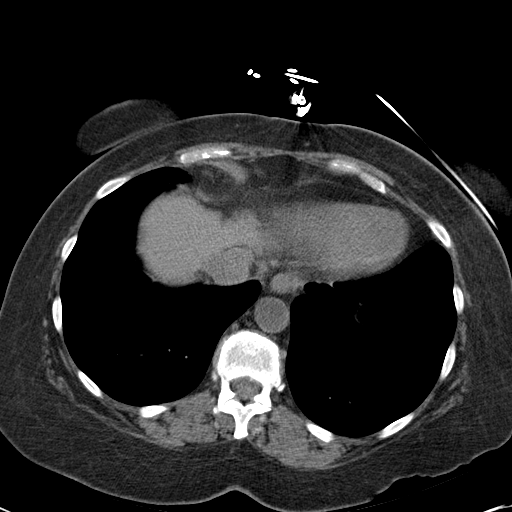
[im 83/87  soft-tissue]
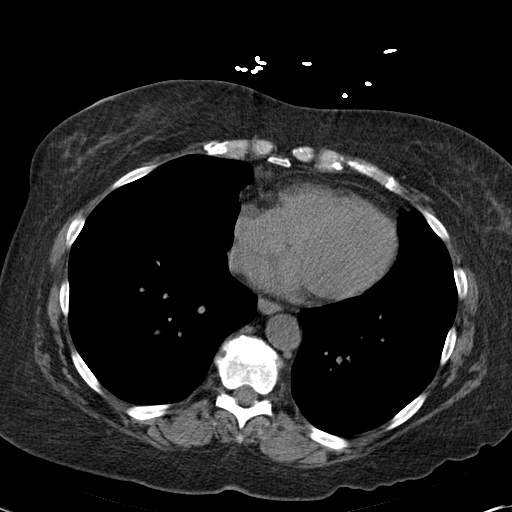

[Series 4: mpr cor (id) · coronal · 0.68mm/px · 3 of 90 slices shown]
[im 30/90  soft-tissue]
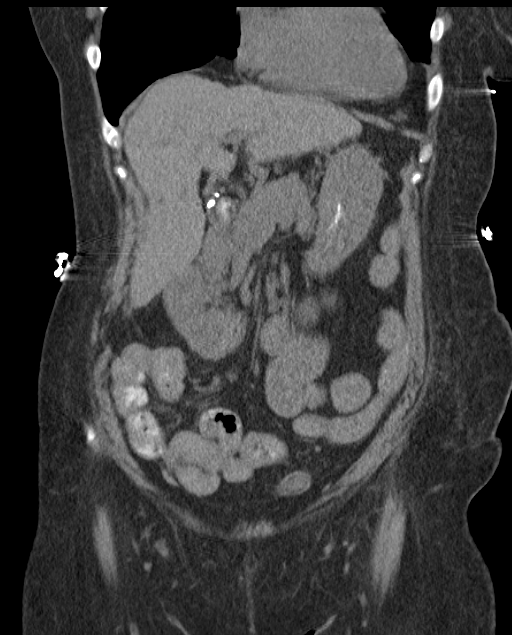
[im 40/90  soft-tissue]
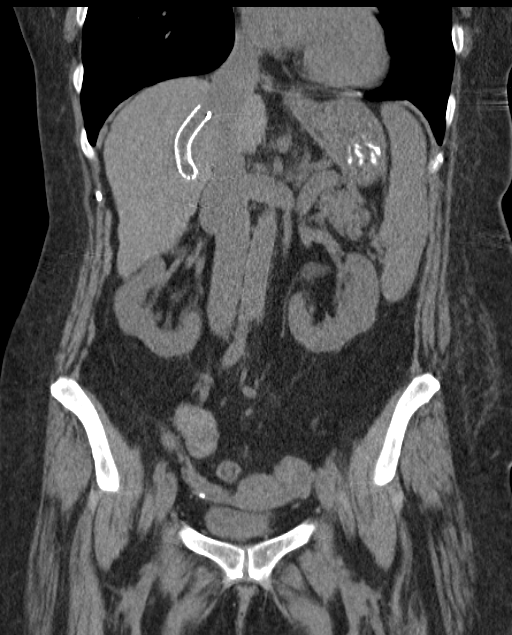
[im 50/90  soft-tissue]
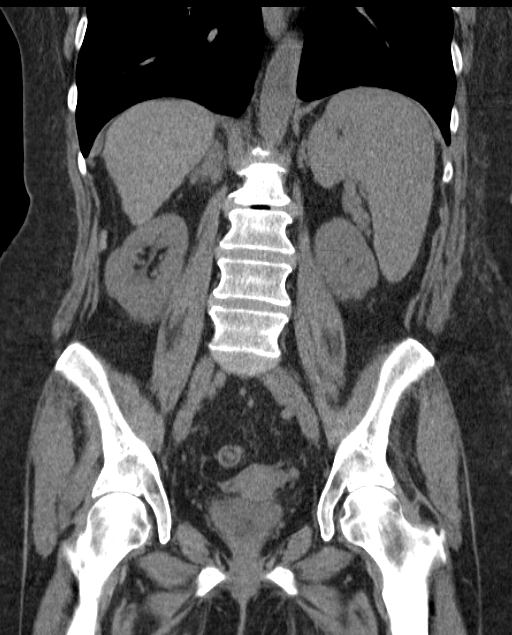

[16 of 46 positions shown; findings below may reference images not displayed]

FINDINGS: Lower chest: The lung bases are clear. No pleural or pericardial
effusion noted.

Hepatobiliary: A TIPS shunt is identified. Morphologic features of
liver compatible with cirrhosis noted. No focal liver abnormality
identified within the limitations of noncontrast thickening.
Previous cholecystectomy. No biliary dilatation.

Pancreas: No mass or inflammatory process identified on this
un-enhanced exam.

Spleen: The spleen is enlarged measuring 14 cm in length.

Adrenals/Urinary Tract: Normal adrenal glands. No focal kidney
abnormality identified. Urinary bladder appears normal for degree of
distention.

Stomach/Bowel: Normal appearance of the stomach. Right lower
quadrant ileostomy is identified. Enteric contrast material is
identified throughout the small bowel loops up to the level of the
ileostomy. Again noted is a ventral abdominal wall hernia which
contains a loop of small bowel. This does not scratch set there does
not appear to be a high-grade obstruction associated with this
hernia at this time. Hartman's pouch is unremarkable.

Vascular/Lymphatic: Normal appearance of the abdominal aorta. No
enlarged retroperitoneal or mesenteric adenopathy. No enlarged
pelvic or inguinal lymph nodes.

Reproductive: No mass or other significant abnormality.

Other: None.

Musculoskeletal: Spondylosis noted within the lower thoracic and
lumbar spine.
IMPRESSION: 1. No acute findings and no explanation for patient's left flank
pain. No kidney stones or ureteral calculi identified at this time.
2. Ventral abdominal wall hernia is again noted containing a
nonobstructed loop of small bowel.
3. Right lower quadrant ileostomy.
4. Cirrhosis with TIPS shunt in place.
